# Patient Record
Sex: Female | Born: 1945
Health system: Southern US, Community
[De-identification: ages and names within clinical notes are randomized; demographics above are authoritative.]

## PROBLEM LIST (undated history)

## (undated) ENCOUNTER — Emergency Department (HOSPITAL_BASED_OUTPATIENT_CLINIC_OR_DEPARTMENT_OTHER): Admission: EM | Payer: Medicare HMO

## (undated) DIAGNOSIS — H269 Unspecified cataract: Secondary | ICD-10-CM

## (undated) DIAGNOSIS — D649 Anemia, unspecified: Secondary | ICD-10-CM

## (undated) DIAGNOSIS — K8020 Calculus of gallbladder without cholecystitis without obstruction: Secondary | ICD-10-CM

## (undated) DIAGNOSIS — R112 Nausea with vomiting, unspecified: Secondary | ICD-10-CM

## (undated) DIAGNOSIS — K219 Gastro-esophageal reflux disease without esophagitis: Secondary | ICD-10-CM

## (undated) DIAGNOSIS — I10 Essential (primary) hypertension: Secondary | ICD-10-CM

## (undated) DIAGNOSIS — T7840XA Allergy, unspecified, initial encounter: Secondary | ICD-10-CM

## (undated) DIAGNOSIS — M1990 Unspecified osteoarthritis, unspecified site: Secondary | ICD-10-CM

## (undated) DIAGNOSIS — A539 Syphilis, unspecified: Secondary | ICD-10-CM

## (undated) DIAGNOSIS — Z923 Personal history of irradiation: Secondary | ICD-10-CM

## (undated) DIAGNOSIS — Z9889 Other specified postprocedural states: Secondary | ICD-10-CM

## (undated) DIAGNOSIS — C50919 Malignant neoplasm of unspecified site of unspecified female breast: Secondary | ICD-10-CM

## (undated) HISTORY — DX: Unspecified cataract: H26.9

## (undated) HISTORY — DX: Allergy, unspecified, initial encounter: T78.40XA

## (undated) HISTORY — PX: TUBAL LIGATION: SHX77

## (undated) HISTORY — DX: Malignant neoplasm of unspecified site of unspecified female breast: C50.919

## (undated) HISTORY — DX: Unspecified osteoarthritis, unspecified site: M19.90

## (undated) HISTORY — PX: JOINT REPLACEMENT: SHX530

## (undated) HISTORY — DX: Gastro-esophageal reflux disease without esophagitis: K21.9

## (undated) HISTORY — PX: DILATION AND CURETTAGE OF UTERUS: SHX78

## (undated) HISTORY — DX: Calculus of gallbladder without cholecystitis without obstruction: K80.20

## (undated) HISTORY — DX: Syphilis, unspecified: A53.9

## (undated) HISTORY — DX: Essential (primary) hypertension: I10

## (undated) HISTORY — PX: NO PAST SURGERIES: SHX2092

---

## 2018-04-24 ENCOUNTER — Encounter: Payer: Self-pay | Admitting: Family Medicine

## 2018-04-24 ENCOUNTER — Ambulatory Visit (INDEPENDENT_AMBULATORY_CARE_PROVIDER_SITE_OTHER): Payer: Medicare HMO | Admitting: Family Medicine

## 2018-04-24 ENCOUNTER — Encounter

## 2018-04-24 VITALS — BP 120/78 | HR 70 | Temp 98.3°F | Ht 62.0 in | Wt 169.2 lb

## 2018-04-24 DIAGNOSIS — R1011 Right upper quadrant pain: Secondary | ICD-10-CM

## 2018-04-24 MED ORDER — OMEPRAZOLE 20 MG PO CPDR: 20.0000 mg | DELAYED_RELEASE_CAPSULE | Freq: Every day | ORAL | 1 refills | Status: DC

## 2018-04-24 NOTE — Progress Notes (Signed)
Chief Complaint  Patient presents with  . New Patient (Initial Visit)       New Patient Visit SUBJECTIVE: HPI: Kelly Dixon is an 72 y.o.female who is being seen for establishing care.   The patient was previously seen at office in [facility].  1.5 mo. Pt has had episodes of abd pain that is severe in the RUQ, but also affects her upper abd in general. Has cut out caffeine. Worse after she eats certain things that are fattier/greasier. No fevers, N/V, some diarrhea, injury. Denies hx of GERD.   No Known Allergies  Past Medical History:  Diagnosis Date  . Arthritis   . GERD (gastroesophageal reflux disease)   . Syphilis    Past Surgical History:  Procedure Laterality Date  . NO PAST SURGERIES       Family History  Problem Relation Age of Onset  . Arthritis Mother   . Hearing loss Mother   . Heart disease Father   . Arthritis Sister    No Known Allergies  Current Outpatient Medications:  .  omeprazole (PRILOSEC) 20 MG capsule, Take 1 capsule (20 mg total) by mouth daily., Disp: 30 capsule, Rfl: 1  ROS Const: Denies fevers  GI: As noted in HPI   OBJECTIVE: BP 120/78 (BP Location: Left Arm, Patient Position: Sitting, Cuff Size: Normal)   Pulse 70   Temp 98.3 F (36.8 C) (Oral)   Ht 5\' 2"  (1.575 m)   Wt 169 lb 4 oz (76.8 kg)   SpO2 96%   BMI 30.96 kg/m   Constitutional: -  VS reviewed -  Well developed, well nourished, appears stated age -  No apparent distress  Psychiatric: -  Oriented to person, place, and time -  Memory intact -  Affect and mood normal -  Fluent conversation, good eye contact -  Judgment and insight age appropriate  Eye: -  Conjunctivae clear, no discharge -  Pupils symmetric, round, reactive to light  ENMT: -  MMM    Pharynx moist, no exudate, no erythema  Neck: -  No gross swelling, no palpable masses -  Thyroid midline, not enlarged, mobile, no palpable masses  Cardiovascular: -  RRR -  No LE edema  Respiratory: -  Normal respiratory  effort, no accessory muscle use, no retraction -  Breath sounds equal, no wheezes, no ronchi, no crackles  Gastrointestinal: -  Bowel sounds normal -  Soft, TTP diffusely, worse in upper abd, neg Murphy's, McBurney's, Rovsing's  Neurological:  -  CN II - XII grossly intact -  Sensation grossly intact to light touch, equal bilaterally  Skin: -  No significant lesion on inspection -  Warm and dry to palpation   ASSESSMENT/PLAN: RUQ abdominal pain - Plan: US Abdomen Limited RUQ, omeprazole (PRILOSEC) 20 MG capsule  Patient instructed to sign release of records form from her previous PCP. Ck [place], also start tx for GERD. Patient should return in 1 mo for CPE. The patient voiced understanding and agreement to the plan.   [place], DO 04/24/18  10:19 AM

## 2018-04-24 NOTE — Progress Notes (Signed)
Pre visit review using our clinic review tool, if applicable. No additional management support is needed unless otherwise documented below in the visit note. 

## 2018-04-24 NOTE — Patient Instructions (Addendum)
The only lifestyle changes that have data behind them are weight loss for the overweight/obese and elevating the head of the bed. Finding out which foods/positions are triggers is important.  2-4 days to get your ultrasound. If you don't hear anything, let us know.  We will be in touch regarding the results.   Let us know if you need anything.

## 2018-04-25 ENCOUNTER — Ambulatory Visit (HOSPITAL_BASED_OUTPATIENT_CLINIC_OR_DEPARTMENT_OTHER)
Admission: RE | Admit: 2018-04-25 | Discharge: 2018-04-25 | Disposition: A | Discharge status: Home / Self Care | Payer: Medicare HMO | Source: Ambulatory Visit | Attending: Family Medicine | Admitting: Family Medicine

## 2018-04-25 DIAGNOSIS — K8020 Calculus of gallbladder without cholecystitis without obstruction: Secondary | ICD-10-CM | POA: Diagnosis not present

## 2018-04-25 DIAGNOSIS — R1011 Right upper quadrant pain: Secondary | ICD-10-CM | POA: Diagnosis not present

## 2018-04-27 ENCOUNTER — Telehealth: Payer: Self-pay | Admitting: Family Medicine

## 2018-04-27 NOTE — Telephone Encounter (Signed)
Patient returning call Copied from [place] [phone]. Topic: Quick Communication - Lab Results >> Apr 27, 2018 12:13 PM Ewing, Donell Sievert, CMA wrote: Called patient to inform them of 04/27/2018 lab results. When patient returns call, triage nurse may disclose results.

## 2018-04-28 ENCOUNTER — Telehealth: Payer: Self-pay | Admitting: Family Medicine

## 2018-04-28 NOTE — Telephone Encounter (Unsigned)
Copied from [place] [id number]. Topic: General - Other >> Apr 28, 2018 12:25 PM Mcneil, Ja-Kwan wrote: Reason for CRM: Pt returned call for lab results. Pt requests call back. Cb# [phone]

## 2018-04-28 NOTE — Telephone Encounter (Signed)
See result note.  

## 2018-04-28 NOTE — Telephone Encounter (Addendum)
Left message on voicemail for pt to return call to office for results. See result note   

## 2018-04-29 ENCOUNTER — Encounter: Payer: Self-pay | Admitting: Family Medicine

## 2018-05-01 ENCOUNTER — Other Ambulatory Visit: Payer: Self-pay | Admitting: Family Medicine

## 2018-05-01 DIAGNOSIS — K8020 Calculus of gallbladder without cholecystitis without obstruction: Secondary | ICD-10-CM

## 2018-05-27 ENCOUNTER — Encounter: Payer: Self-pay | Admitting: Family Medicine

## 2018-05-27 ENCOUNTER — Ambulatory Visit (INDEPENDENT_AMBULATORY_CARE_PROVIDER_SITE_OTHER): Payer: Medicare HMO | Admitting: Family Medicine

## 2018-05-27 VITALS — BP 118/80 | HR 82 | Temp 98.2°F | Ht 62.0 in | Wt 174.0 lb

## 2018-05-27 DIAGNOSIS — Z1239 Encounter for other screening for malignant neoplasm of breast: Secondary | ICD-10-CM

## 2018-05-27 DIAGNOSIS — Z0000 Encounter for general adult medical examination without abnormal findings: Secondary | ICD-10-CM | POA: Diagnosis not present

## 2018-05-27 DIAGNOSIS — M170 Bilateral primary osteoarthritis of knee: Secondary | ICD-10-CM | POA: Diagnosis not present

## 2018-05-27 DIAGNOSIS — E2839 Other primary ovarian failure: Secondary | ICD-10-CM

## 2018-05-27 DIAGNOSIS — Z23 Encounter for immunization: Secondary | ICD-10-CM

## 2018-05-27 DIAGNOSIS — Z1231 Encounter for screening mammogram for malignant neoplasm of breast: Secondary | ICD-10-CM | POA: Diagnosis not present

## 2018-05-27 DIAGNOSIS — E785 Hyperlipidemia, unspecified: Secondary | ICD-10-CM | POA: Diagnosis not present

## 2018-05-27 MED ORDER — MELOXICAM 7.5 MG PO TABS: 7.5000 mg | ORAL_TABLET | Freq: Every day | ORAL | 2 refills | Status: DC

## 2018-05-27 NOTE — Progress Notes (Signed)
Chief Complaint  Patient presents with  . Annual Exam     Well Woman Kelly Dixon is here for a complete physical.   Her last physical was >1 year ago.  Current diet: in general, a "healthy" diet. Current exercise: swimming. Weight is largely stable and she denies daytime fatigue. No LMP recorded. Seatbelt? Yes  Health Maintenance Colonoscopy- No; Cologard Shingrix- No DEXA- No Mammogram- No Tetanus- No Pneumonia- No Hep C screen- No  Past Medical History:  Diagnosis Date  . Arthritis   . GERD (gastroesophageal reflux disease)   . Syphilis      Past Surgical History:  Procedure Laterality Date  . NO PAST SURGERIES      Medications  No current outpatient medications on file prior to visit.   No current facility-administered medications on file prior to visit.      Allergies No Known Allergies  Review of Systems: Constitutional:  no sweats Eye:  no recent significant change in vision Ear/Nose/Mouth/Throat:  Ears:  No changes in hearing Nose/Mouth/Throat:  no complaints of nasal congestion, no sore throat Cardiovascular: no chest pain Respiratory:  No cough and no shortness of breath Gastrointestinal:  no abdominal pain, no change in bowel habits GU:  Female: negative for dysuria or pelvic pain Musculoskeletal/Extremities:  no pain of the joints Integumentary (Skin/Breast):  no abnormal skin lesions reported Neurologic:  no headaches Psychiatric:  no anxiety, no depression Endocrine:  denies unexplained weight changes Hematologic/Lymphatic:  no abnormal bleeding  Exam BP 118/80 (BP Location: Left Arm, Patient Position: Sitting, Cuff Size: Normal)   Pulse 82   Temp 98.2 F (36.8 C) (Oral)   Ht 5\' 2"  (1.575 m)   Wt 174 lb (78.9 kg)   SpO2 98%   BMI 31.83 kg/m  General:  well developed, well nourished, in no apparent distress Skin:  no significant moles, warts, or growths Head:  no masses, lesions, or tenderness Eyes:  pupils equal and round, sclera  anicteric without injection Ears:  canals without lesions, TMs shiny without retraction, no obvious effusion, no erythema Nose:  nares patent, septum midline, mucosa normal, and no drainage or sinus tenderness Throat/Pharynx:  lips and gingiva without lesion; tongue and uvula midline; non-inflamed pharynx; no exudates or postnasal drainage Neck: neck supple without adenopathy, thyromegaly, or masses Lungs:  clear to auscultation, breath sounds equal bilaterally, no respiratory distress Cardio:  regular rate and rhythm, no bruits or LE edema Abdomen:  abdomen soft, nontender; bowel sounds normal; no masses or organomegaly Genital: Deferred Musculoskeletal:  symmetrical muscle groups noted without atrophy or deformity Extremities:  no clubbing, cyanosis, or edema, no deformities, no skin discoloration Neuro:  gait normal; deep tendon reflexes normal and symmetric Psych: well oriented with normal range of affect and appropriate judgment/insight  Assessment and Plan  Well adult exam - Plan: Lipid panel, Comprehensive metabolic panel, Hepatitis C antibody  Estrogen deficiency - Plan: DG Bone Density  Primary osteoarthritis of both knees - Plan: meloxicam (MOBIC) 7.5 MG tablet  Screening for breast cancer - Plan: MM DIGITAL SCREENING BILATERAL  Need for tetanus booster - Plan: Tdap vaccine greater than or equal to 7yo IM  Need for vaccination against Streptococcus pneumoniae - Plan: Pneumococcal conjugate vaccine 13-valent IM   Well 72 y.o. female. Counseled on diet and exercise. Pt refuses colonoscopy, will do Cologard.  Will see about Shingrix.  Trial low dose of Mobic, consider injections in future. Other orders as above. Labs need to go through [facility] lab. Follow up in  1 year pending the above workup. 6 mo for AWV. The patient voiced understanding and agreement to the plan.  [place], DO 05/27/18 9:37 AM

## 2018-05-27 NOTE — Addendum Note (Signed)
Addended by: Harl Bowie on: 05/27/2018 12:05 PM   Modules accepted: Orders

## 2018-05-27 NOTE — Patient Instructions (Addendum)
Give [place] 2-3 business days to get the results of your labs back.   Stay active and keep the diet clean.  Claritin (loratadine), Allegra (fexofenadine), Zyrtec (cetirizine); these are listed in order from weakest to strongest. Generic, and therefore cheaper, options are in the parentheses.   Flonase (fluticasone); nasal spray that is over the counter. 2 sprays each nostril, once daily. Aim towards the same side eye when you spray.  There are available OTC, and the generic versions, which may be cheaper, are in parentheses. Show this to a pharmacist if you have trouble finding any of these items.  Let us know if you need anything.

## 2018-05-28 LAB — COMPREHENSIVE METABOLIC PANEL
AG Ratio: 1.7 (calc) (ref 1.0–2.5)
ALBUMIN MSPROF: 4.2 g/dL (ref 3.6–5.1)
ALT: 6 U/L (ref 6–29)
AST: 11 U/L (ref 10–35)
Alkaline phosphatase (APISO): 59 U/L (ref 33–130)
BUN: 14 mg/dL (ref 7–25)
CALCIUM: 9.2 mg/dL (ref 8.6–10.4)
CO2: 26 mmol/L (ref 20–32)
CREATININE: 0.79 mg/dL (ref 0.50–0.99)
Chloride: 108 mmol/L (ref 98–110)
GLUCOSE: 83 mg/dL (ref 65–99)
Globulin: 2.5 g/dL (calc) (ref 1.9–3.7)
POTASSIUM: 4.7 mmol/L (ref 3.5–5.3)
SODIUM: 145 mmol/L (ref 135–146)
TOTAL PROTEIN: 6.7 g/dL (ref 6.1–8.1)
Total Bilirubin: 0.4 mg/dL (ref 0.2–1.2)

## 2018-05-28 LAB — LIPID PANEL
CHOL/HDL RATIO: 3.6 (calc) (ref <5.0)
CHOLESTEROL: 186 mg/dL (ref <200)
HDL: 52 mg/dL (ref >50)
LDL CHOLESTEROL (CALC): 112 mg/dL — AB
NON-HDL CHOLESTEROL (CALC): 134 mg/dL — AB (ref <130)
Triglycerides: 114 mg/dL (ref <150)

## 2018-05-28 LAB — HEPATITIS C ANTIBODY
Hepatitis C Ab: NONREACTIVE
SIGNAL TO CUT-OFF: 0.02 (ref <1.00)

## 2018-05-31 DIAGNOSIS — K8020 Calculus of gallbladder without cholecystitis without obstruction: Secondary | ICD-10-CM

## 2018-05-31 HISTORY — DX: Calculus of gallbladder without cholecystitis without obstruction: K80.20

## 2018-06-03 ENCOUNTER — Telehealth: Payer: Self-pay

## 2018-06-03 NOTE — Telephone Encounter (Signed)
Cologuard ordered through [employer] portal.  

## 2018-06-10 DIAGNOSIS — Z1211 Encounter for screening for malignant neoplasm of colon: Secondary | ICD-10-CM | POA: Diagnosis not present

## 2018-06-12 ENCOUNTER — Other Ambulatory Visit: Payer: Self-pay | Admitting: Family Medicine

## 2018-06-12 DIAGNOSIS — M170 Bilateral primary osteoarthritis of knee: Secondary | ICD-10-CM

## 2018-06-12 MED ORDER — MELOXICAM 7.5 MG PO TABS: 7.5000 mg | ORAL_TABLET | Freq: Every day | ORAL | 0 refills | Status: DC

## 2018-06-24 ENCOUNTER — Encounter: Payer: Self-pay | Admitting: Family Medicine

## 2018-06-24 ENCOUNTER — Ambulatory Visit (INDEPENDENT_AMBULATORY_CARE_PROVIDER_SITE_OTHER): Payer: Medicare HMO | Admitting: Family Medicine

## 2018-06-24 VITALS — BP 126/72 | HR 69 | Temp 98.1°F | Resp 16 | Wt 175.0 lb

## 2018-06-24 DIAGNOSIS — Z111 Encounter for screening for respiratory tuberculosis: Secondary | ICD-10-CM

## 2018-06-24 DIAGNOSIS — H52209 Unspecified astigmatism, unspecified eye: Secondary | ICD-10-CM | POA: Diagnosis not present

## 2018-06-24 DIAGNOSIS — Z0289 Encounter for other administrative examinations: Secondary | ICD-10-CM | POA: Diagnosis not present

## 2018-06-24 DIAGNOSIS — H5213 Myopia, bilateral: Secondary | ICD-10-CM | POA: Diagnosis not present

## 2018-06-24 NOTE — Progress Notes (Signed)
CC: form filled out.  Subjective: Patient is a 72 y.o. female here for completion of form.  Needs Tb test. 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 W exception of flu shot, she is UTD with her immunizations. No recent travel to Tb endemic countries. Able to lift up to 40 lbs w/o issue. No sob or cp.   ROS: Heart: Denies chest pain  Lungs: Denies SOB   Past Medical History:  Diagnosis Date  . Arthritis   . GERD (gastroesophageal reflux disease)   . Syphilis     Objective: BP 126/72   Pulse 69   Temp 98.1 F (36.7 C) (Oral)   Resp 16   Wt 175 lb (79.4 kg)   SpO2 98%   BMI 32.01 kg/m  General: Awake, appears stated age HEENT: MMM, EOMi Heart: RRR, no murmurs Lungs: CTAB, no rales, wheezes or rhonchi. No accessory muscle use MSK: 5/5 strength throughout; gait nml Psych: Age appropriate judgment and insight, normal affect and mood  Assessment and Plan: Encounter for completion of form with patient  Screening for tuberculosis - Plan: PPD  Tb test today. Fill out form, will complete Fri when Tb test info available. F/u prn otherwise regarding this. The patient voiced understanding and agreement to the plan.  [place], DO 06/24/18  10:18 AM

## 2018-06-24 NOTE — Patient Instructions (Addendum)
Please bring the fax number for where to send your form.  We will make a copy of your form and fax it when we have it. You will also receive a copy.  Let us know if you need anything.

## 2018-06-26 ENCOUNTER — Ambulatory Visit: Payer: Medicare HMO

## 2018-06-26 DIAGNOSIS — Z111 Encounter for screening for respiratory tuberculosis: Secondary | ICD-10-CM

## 2018-06-26 LAB — TB SKIN TEST
INDURATION: 0 mm
TB Skin Test: NEGATIVE

## 2018-06-29 LAB — COLOGUARD: Cologuard: NEGATIVE

## 2018-06-29 NOTE — Telephone Encounter (Signed)
Received Cologuard results//Negative//sent results to scan//called the patient to inform//abstracted into chart.

## 2018-07-27 ENCOUNTER — Encounter (HOSPITAL_BASED_OUTPATIENT_CLINIC_OR_DEPARTMENT_OTHER): Payer: Self-pay | Admitting: Radiology

## 2018-07-27 ENCOUNTER — Ambulatory Visit (HOSPITAL_BASED_OUTPATIENT_CLINIC_OR_DEPARTMENT_OTHER)
Admission: RE | Admit: 2018-07-27 | Discharge: 2018-07-27 | Disposition: A | Discharge status: Home / Self Care | Payer: Medicare HMO | Source: Ambulatory Visit | Attending: Family Medicine | Admitting: Family Medicine

## 2018-07-27 DIAGNOSIS — Z13820 Encounter for screening for osteoporosis: Secondary | ICD-10-CM | POA: Diagnosis not present

## 2018-07-27 DIAGNOSIS — F17200 Nicotine dependence, unspecified, uncomplicated: Secondary | ICD-10-CM | POA: Diagnosis not present

## 2018-07-27 DIAGNOSIS — Z780 Asymptomatic menopausal state: Secondary | ICD-10-CM | POA: Insufficient documentation

## 2018-07-27 DIAGNOSIS — Z1239 Encounter for other screening for malignant neoplasm of breast: Secondary | ICD-10-CM

## 2018-07-27 DIAGNOSIS — M8589 Other specified disorders of bone density and structure, multiple sites: Secondary | ICD-10-CM | POA: Diagnosis not present

## 2018-07-27 DIAGNOSIS — M85832 Other specified disorders of bone density and structure, left forearm: Secondary | ICD-10-CM | POA: Diagnosis not present

## 2018-07-27 DIAGNOSIS — Z1231 Encounter for screening mammogram for malignant neoplasm of breast: Secondary | ICD-10-CM | POA: Insufficient documentation

## 2018-07-27 DIAGNOSIS — E2839 Other primary ovarian failure: Secondary | ICD-10-CM | POA: Insufficient documentation

## 2018-07-27 DIAGNOSIS — M85852 Other specified disorders of bone density and structure, left thigh: Secondary | ICD-10-CM | POA: Diagnosis not present

## 2018-09-30 DIAGNOSIS — C50919 Malignant neoplasm of unspecified site of unspecified female breast: Secondary | ICD-10-CM

## 2018-09-30 HISTORY — DX: Malignant neoplasm of unspecified site of unspecified female breast: C50.919

## 2018-11-27 ENCOUNTER — Ambulatory Visit: Payer: Medicare HMO | Admitting: *Deleted

## 2018-12-23 NOTE — Progress Notes (Deleted)
I connected with Jeliyah on 12/23/18 at  4:00 PM EDT by a video enabled telemedicine application and verified that I am speaking with the correct person using two identifiers.   Subjective:   Allyne Hebert is a 73 y.o. female who presents for an Initial Medicare Annual Wellness Visit.  Review of Systems   No ROS.  Medicare Wellness Visit. Additional risk factors are reflected in the social history.   Sleep patterns:  Home Safety/Smoke Alarms: Feels safe in home. Smoke alarms in place.   Female:        Mammo- 07/27/18       Dexa scan- 07/27/18 [facility]- Cologuard 06/29/18-neg     Objective:  UTA vitals. Virtual visit d/t covid 19.  No flowsheet data found.  Current Medications (verified) Outpatient Encounter Medications as of 12/24/2018  Medication Sig  . meloxicam (MOBIC) 7.5 MG tablet Take 1 tablet (7.5 mg total) by mouth daily.   No facility-administered encounter medications on file as of 12/24/2018.     Allergies (verified) Patient has no known allergies.   History: Past Medical History:  Diagnosis Date  . Arthritis   . GERD (gastroesophageal reflux disease)   . Syphilis    Past Surgical History:  Procedure Laterality Date  . NO PAST SURGERIES     Family History  Problem Relation Age of Onset  . Arthritis Mother   . Hearing loss Mother   . Heart disease Father   . Arthritis Sister    Social History   Socioeconomic History  . Marital status: Divorced    Spouse name: Not on file  . Number of children: Not on file  . Years of education: Not on file  . Highest education level: Not on file  Occupational History  . Not on file  Social Needs  . Financial resource strain: Not on file  . Food insecurity:    Worry: Not on file    Inability: Not on file  . Transportation needs:    Medical: Not on file    Non-medical: Not on file  Tobacco Use  . Smoking status: Current Every Day Smoker  . Smokeless tobacco: Never Used  Substance and Sexual Activity  .  Alcohol use: Yes    Comment: rarely  . Drug use: Never  . Sexual activity: Not on file  Lifestyle  . Physical activity:    Days per week: Not on file    Minutes per session: Not on file  . Stress: Not on file  Relationships  . Social connections:    Talks on phone: Not on file    Gets together: Not on file    Attends religious service: Not on file    Active member of club or organization: Not on file    Attends meetings of clubs or organizations: Not on file    Relationship status: Not on file  Other Topics Concern  . Not on file  Social History Narrative  . Not on file    Tobacco Counseling Ready to quit: Not Answered Counseling given: Not Answered   Clinical Intake:                        Activities of Daily Living No flowsheet data found.   Immunizations and Health Maintenance Immunization History  Administered Date(s) Administered  . PPD Test 06/24/2018  . Pneumococcal Conjugate-13 05/27/2018  . Tdap 05/27/2018   Health Maintenance Due  Topic Date Due  . COLONOSCOPY  07/27/1998  . INFLUENZA VACCINE  04/30/2018    Patient Care Team: Shelda Pal, DO as PCP - General (Family Medicine)  Indicate any recent Medical Services you may have received from other than [facility] providers in the past year (date may be approximate).     Assessment:   This is a routine wellness examination for Everlee. Physical assessment deferred to PCP.  Hearing/Vision screen [place] -virtual- covid 19 Dietary issues and exercise activities discussed:   Diet (meal preparation, eat out, water intake, caffeinated beverages, dairy products, fruits and vegetables): {Desc; diets:16563} Breakfast: Lunch:  Dinner:      Goals   None    Depression Screen No flowsheet data found.  Fall Risk No flowsheet data found.  Cognitive Function:        Screening Tests Health Maintenance  Topic Date Due  . COLONOSCOPY  07/27/1998  . INFLUENZA VACCINE  04/30/2018   . PNA vac Low Risk Adult (2 of 2 - PPSV23) 05/28/2019  . MAMMOGRAM  07/27/2020  . TETANUS/TDAP  05/27/2028  . DEXA SCAN  Completed  . Hepatitis C Screening  Completed     Plan:   ***  I have personally reviewed and noted the following in the patient's chart:   . Medical and social history . Use of alcohol, tobacco or illicit drugs  . Current medications and supplements . Functional ability and status . Nutritional status . Physical activity . Advanced directives . List of other physicians . Hospitalizations, surgeries, and ER visits in previous 12 months . Vitals . Screenings to include cognitive, depression, and falls . Referrals and appointments  In addition, I have reviewed and discussed with patient certain preventive protocols, quality metrics, and best practice recommendations. A written personalized care plan for preventive services as well as general preventive health recommendations were provided to patient.     Naaman Plummer [place], [place]   12/23/2018

## 2018-12-24 ENCOUNTER — Other Ambulatory Visit: Payer: Self-pay

## 2018-12-24 ENCOUNTER — Ambulatory Visit: Payer: Medicare HMO | Admitting: *Deleted

## 2018-12-25 NOTE — Progress Notes (Signed)
I connected with [name] on 12/28/18 at 11:00 AM EDT by a video enabled telemedicine application and verified that I am speaking with the correct person using two identifiers.   Subjective:   Kelly Dixon is a 73 y.o. female who presents for an Initial Medicare Annual Wellness Visit.  The Patient was informed that the wellness visit is to identify future health risk and educate and initiate measures that can reduce risk for increased disease through the lifespan.   Describes health as fair, good or great? Good  Kelly Dixon is reading. Goes to church.   Review of Systems   No ROS.  Medicare Wellness Visit. Additional risk factors are reflected in the social history. Cardiac Risk Factors include: advanced age (>54men, >69 women) Sleep patterns: sleeps 8 hrs every night. Feels rested. Home Safety/Smoke Alarms: Feels safe in home. Smoke alarms in place.  Lives with niece in 2 story home. Step over tub. Grab bar.   Female:     Mammo- 07/27/18      Dexa scan- 07/27/18 [facility]- Cologuard 06/29/18.  Negative     Objective:    There were no vitals filed for this visit. There is no height or weight on file to calculate BMI.  Advanced Directives 12/28/2018  Does Patient Have a Medical Advance Directive? No  Would patient like information on creating a medical advance directive? No - Patient declined    Current Medications (verified) Outpatient Encounter Medications as of 12/28/2018  Medication Sig  . loratadine (CLARITIN) 10 MG tablet Take 10 mg by mouth daily.  . meloxicam (MOBIC) 7.5 MG tablet Take 1 tablet (7.5 mg total) by mouth daily.   No facility-administered encounter medications on file as of 12/28/2018.     Allergies (verified) Patient has no known allergies.   History: Past Medical History:  Diagnosis Date  . Arthritis   . Gallstones 05/31/2018  . GERD (gastroesophageal reflux disease)   . Syphilis    Past Surgical History:  Procedure Laterality Date  . NO PAST  SURGERIES     Family History  Problem Relation Age of Onset  . Arthritis Mother   . Hearing loss Mother   . Heart disease Father   . Arthritis Sister    Social History   Socioeconomic History  . Marital status: Divorced    Spouse name: Not on file  . Number of children: Not on file  . Years of education: Not on file  . Highest education level: Not on file  Occupational History  . Not on file  Social Needs  . Financial resource strain: Not on file  . Food insecurity:    Worry: Not on file    Inability: Not on file  . Transportation needs:    Medical: Not on file    Non-medical: Not on file  Tobacco Use  . Smoking status: Current Every Day Smoker    Packs/day: 0.25  . Smokeless tobacco: Never Used  Substance and Sexual Activity  . Alcohol use: Yes    Comment: rarely  . Drug use: Never  . Sexual activity: Not on file  Lifestyle  . Physical activity:    Days per week: Not on file    Minutes per session: Not on file  . Stress: Not on file  Relationships  . Social connections:    Talks on phone: Not on file    Gets together: Not on file    Attends religious service: Not on file    Active member of  club or organization: Not on file    Attends meetings of clubs or organizations: Not on file    Relationship status: Not on file  Other Topics Concern  . Not on file  Social History Narrative  . Not on file    Tobacco Counseling Ready to quit: No Counseling given: No   Clinical Intake: Pain : No/denies pain    Activities of Daily Living In your present state of health, do you have any difficulty performing the following activities: 12/28/2018  Hearing? N  Vision? N  Difficulty concentrating or making decisions? N  Walking or climbing stairs? N  Dressing or bathing? N  Doing errands, shopping? N  Preparing Food and eating ? N  Using the Toilet? N  In the past six months, have you accidently leaked urine? N  Do you have problems with loss of bowel control?  N  Managing your Medications? N  Managing your Finances? N  Housekeeping or managing your Housekeeping? N     Immunizations and Health Maintenance Immunization History  Administered Date(s) Administered  . PPD Test 06/24/2018  . Pneumococcal Conjugate-13 05/27/2018  . Tdap 05/27/2018   Health Maintenance Due  Topic Date Due  . INFLUENZA VACCINE  04/30/2018    Patient Care Team: Shelda Pal, DO as PCP - General (Family Medicine)  Indicate any recent Medical Services you may have received from other than [facility] providers in the past year (date may be approximate).     Assessment:   This is a routine wellness examination for Panda. Physical assessment deferred to PCP.  Hearing/Vision screen [place]. Virtual visit. covid 19  Dietary issues and exercise activities discussed: Current Exercise Habits: The patient does not participate in regular exercise at present, Exercise limited by: orthopedic condition(s)(knee pain) Diet (meal preparation, eat out, water intake, caffeinated beverages, dairy products, fruits and vegetables): in general, a "healthy" diet  , well balanced, on average, 3 meals per day. Drinks plenty of water throughout the day.        Goals    . Maintain current health.      Depression Screen PHQ 2/9 Scores 12/28/2018  PHQ - 2 Score 0    Fall Risk Fall Risk  12/28/2018  Falls in the past year? 0    Cognitive Function: Ad8 score reviewed for issues:  Issues making decisions:no  Less interest in hobbies / activities:no  Repeats questions, stories (family complaining):no  Trouble using ordinary gadgets (microwave, computer, phone):no  Forgets the month or year: no  Mismanaging finances: no  Remembering appts:no  Daily problems with thinking and/or memory:no Ad8 score is=0         Screening Tests Health Maintenance  Topic Date Due  . INFLUENZA VACCINE  04/30/2018  . PNA vac Low Risk Adult (2 of 2 - PPSV23) 05/28/2019  . MAMMOGRAM   07/27/2020  . TETANUS/TDAP  05/27/2028  . COLONOSCOPY  06/29/2028  . DEXA SCAN  Completed  . Hepatitis C Screening  Completed      Plan:     See you next year!  Continue to eat heart healthy diet (full of fruits, vegetables, whole grains, lean protein, water--limit salt, fat, and sugar intake) and increase physical activity as tolerated.  Continue doing brain stimulating activities (puzzles, reading, adult coloring books, staying active) to keep memory sharp.     I have personally reviewed and noted the following in the patient's chart:   . Medical and social history . Use of alcohol, tobacco or illicit  drugs  . Current medications and supplements . Functional ability and status . Nutritional status . Physical activity . Advanced directives . List of other physicians . Hospitalizations, surgeries, and ER visits in previous 12 months . Vitals . Screenings to include cognitive, depression, and falls . Referrals and appointments  In addition, I have reviewed and discussed with patient certain preventive protocols, quality metrics, and best practice recommendations. A written personalized care plan for preventive services as well as general preventive health recommendations were provided to patient.     Shela Nevin, [place]   12/28/2018

## 2018-12-28 ENCOUNTER — Other Ambulatory Visit: Payer: Self-pay

## 2018-12-28 ENCOUNTER — Encounter: Payer: Self-pay | Admitting: *Deleted

## 2018-12-28 ENCOUNTER — Ambulatory Visit (INDEPENDENT_AMBULATORY_CARE_PROVIDER_SITE_OTHER): Payer: Medicare HMO | Admitting: *Deleted

## 2018-12-28 DIAGNOSIS — Z0000 Encounter for general adult medical examination without abnormal findings: Secondary | ICD-10-CM

## 2018-12-28 NOTE — Patient Instructions (Signed)
See you next year!  Continue to eat heart healthy diet (full of fruits, vegetables, whole grains, lean protein, water--limit salt, fat, and sugar intake) and increase physical activity as tolerated.  Continue doing brain stimulating activities (puzzles, reading, adult coloring books, staying active) to keep memory sharp.     Kelly Dixon , Thank you for taking time to come for your Medicare Wellness Visit. I appreciate your ongoing commitment to your health goals. Please review the following plan we discussed and let me know if I can assist you in the future.   These are the goals we discussed: Goals    . Maintain current health.       This is a list of the screening recommended for you and due dates:  Health Maintenance  Topic Date Due  . Flu Shot  04/30/2018  . Pneumonia vaccines (2 of 2 - PPSV23) 05/28/2019  . Mammogram  07/27/2020  . Tetanus Vaccine  05/27/2028  . Colon Cancer Screening  06/29/2028  . DEXA scan (bone density measurement)  Completed  .  Hepatitis C: One time screening is recommended by Center for Disease Control  (CDC) for  adults born from [birth year] through 1963.   Completed    Health Maintenance After Age 73 After age 79, you are at a higher risk for certain long-term diseases and infections as well as injuries from falls. Falls are a major cause of broken bones and head injuries in people who are older than age 51. Getting regular preventive care can help to keep you healthy and well. Preventive care includes getting regular testing and making lifestyle changes as recommended by your health care provider. Talk with your health care provider about:  Which screenings and tests you should have. A screening is a test that checks for a disease when you have no symptoms.  A diet and exercise plan that is right for you. What should I know about screenings and tests to prevent falls? Screening and testing are the best ways to find a health problem early. Early diagnosis and  treatment give you the best chance of managing medical conditions that are common after age 29. Certain conditions and lifestyle choices may make you more likely to have a fall. Your health care provider may recommend:  Regular vision checks. Poor vision and conditions such as cataracts can make you more likely to have a fall. If you wear glasses, make sure to get your prescription updated if your vision changes.  Medicine review. Work with your health care provider to regularly review all of the medicines you are taking, including over-the-counter medicines. Ask your health care provider about any side effects that may make you more likely to have a fall. Tell your health care provider if any medicines that you take make you feel dizzy or sleepy.  Osteoporosis screening. Osteoporosis is a condition that causes the bones to get weaker. This can make the bones weak and cause them to break more easily.  Blood pressure screening. Blood pressure changes and medicines to control blood pressure can make you feel dizzy.  Strength and balance checks. Your health care provider may recommend certain tests to check your strength and balance while standing, walking, or changing positions.  Foot health exam. Foot pain and numbness, as well as not wearing proper footwear, can make you more likely to have a fall.  Depression screening. You may be more likely to have a fall if you have a fear of falling, feel emotionally low, or  feel unable to do activities that you used to do.  Alcohol use screening. Using too much alcohol can affect your balance and may make you more likely to have a fall. What actions can I take to lower my risk of falls? General instructions  Talk with your health care provider about your risks for falling. Tell your health care provider if: ? You fall. Be sure to tell your health care provider about all falls, even ones that seem minor. ? You feel dizzy, sleepy, or off-balance.  Take  over-the-counter and prescription medicines only as told by your health care provider. These include any supplements.  Eat a healthy diet and maintain a healthy weight. A healthy diet includes low-fat dairy products, low-fat (lean) meats, and fiber from whole grains, beans, and lots of fruits and vegetables. Home safety  Remove any tripping hazards, such as rugs, cords, and clutter.  Install safety equipment such as grab bars in bathrooms and safety rails on stairs.  Keep rooms and walkways well-lit. Activity   Follow a regular exercise program to stay fit. This will help you maintain your balance. Ask your health care provider what types of exercise are appropriate for you.  If you need a cane or walker, use it as recommended by your health care provider.  Wear supportive shoes that have nonskid soles. Lifestyle  Do not drink alcohol if your health care provider tells you not to drink.  If you drink alcohol, limit how much you have: ? 0-1 drink a day for women. ? 0-2 drinks a day for men.  Be aware of how much alcohol is in your drink. In the [place] one drink equals one typical bottle of beer (12 oz), one-half glass of wine (5 oz), or one shot of hard liquor (1 oz).  Do not use any products that contain nicotine or tobacco, such as cigarettes and e-cigarettes. If you need help quitting, ask your health care provider. Summary  Having a healthy lifestyle and getting preventive care can help to protect your health and wellness after age 79.  Screening and testing are the best way to find a health problem early and help you avoid having a fall. Early diagnosis and treatment give you the best chance for managing medical conditions that are more common for people who are older than age 16.  Falls are a major cause of broken bones and head injuries in people who are older than age 58. Take precautions to prevent a fall at home.  Work with your health care provider to learn what changes  you can make to improve your health and wellness and to prevent falls. This information is not intended to replace advice given to you by your health care provider. Make sure you discuss any questions you have with your health care provider. Document Released: 07/30/2017 Document Revised: 07/30/2017 Document Reviewed: 07/30/2017 Elsevier Interactive Patient Education  2019 [employer].

## 2018-12-28 NOTE — Progress Notes (Signed)
Noted. Agree with above.  [place], DO 12/28/18 12:19 PM

## 2019-02-23 ENCOUNTER — Other Ambulatory Visit: Payer: Self-pay | Admitting: Family Medicine

## 2019-02-23 ENCOUNTER — Telehealth: Payer: Self-pay

## 2019-02-23 DIAGNOSIS — K8020 Calculus of gallbladder without cholecystitis without obstruction: Secondary | ICD-10-CM

## 2019-02-23 NOTE — Progress Notes (Signed)
am

## 2019-02-23 NOTE — Telephone Encounter (Signed)
OK to refer to Gen Surg for symptomatic gallstones. If she is unable to keep anything down, will need to seek care prior. Ty.

## 2019-02-23 NOTE — Telephone Encounter (Signed)
Copied from [place] [id number]. Topic: General - Other >> Feb 23, 2019 10:53 AM Antonieta Iba C wrote: Reason for CRM: pt called in to be advised. Pt says that she was told by her PCP that she has some gallbladder issues. Pt says that she was advised that she could live with this unless pt has concerns. Pt says that she has been having some stomach pain and vomiting. Pt says that she spoke with insurance [facility]) and was advised that she would like to have a gallbladder removal and to have office call them to have this authorized.    [employer] [phone]   pt says that she was told that she need a ambulatory Surgical Service and to call and speak with [facility] department for authorization.    Pt would like further assistance with this.

## 2019-02-23 NOTE — Telephone Encounter (Signed)
She is able to keep food down just does not feel like eating. She will call if things get worse and hopefully gets appt soon.

## 2019-02-23 NOTE — Telephone Encounter (Signed)
Referral done

## 2019-02-25 ENCOUNTER — Encounter: Payer: Self-pay | Admitting: Family Medicine

## 2019-03-17 ENCOUNTER — Ambulatory Visit: Payer: Self-pay | Admitting: Surgery

## 2019-03-17 DIAGNOSIS — K8010 Calculus of gallbladder with chronic cholecystitis without obstruction: Secondary | ICD-10-CM | POA: Diagnosis not present

## 2019-03-17 NOTE — H&P (Signed)
History of Present Illness Kelly Dixon. Trentyn Boisclair MD; 03/17/2019 3:30 PM) The patient is a 73 year old female who presents for evaluation of gall stones. Referred by Dr. Riki Sheer for symptomatic gallstones This is a very healthy 73 year old female who presents with a 4 year history of intermittent postprandial abdominal pain in the epigastrium and right upper quadrant. This is associated with significant abdominal bloating and flatulence. The patient has intermittent attacks of severe pain associated with nausea, vomiting, diarrhea. The pain radiates through her right side to her back. In July 2019, she underwent right upper quadrant ultrasound that showed numerous gallstones but no sign of acute cholecystitis. Liver function test obtained in August 2019 were unremarkable. Her symptoms seem to be a bit worse recently so she presents now for surgical evaluation. Her only previous abdominal surgery was a laparoscopic tubal ligation.    CLINICAL DATA: Sporadic postprandial right upper quadrant abdominal pain, nausea, vomiting and diarrhea for 2 months.  EXAM: ULTRASOUND ABDOMEN LIMITED RIGHT UPPER QUADRANT  COMPARISON: None.  FINDINGS: Gallbladder:  Numerous layering shadowing gallstones throughout the nondistended gallbladder measuring up to 1.8 cm in size, with no gallbladder wall thickening, pericholecystic fluid or sonographic Murphy sign.  Common bile duct:  Diameter: 2 mm  Liver:  No focal lesion identified. Within normal limits in parenchymal echogenicity. Portal vein is patent on color Doppler imaging with normal direction of blood flow towards the liver.  IMPRESSION: 1. Cholelithiasis. No evidence of acute cholecystitis. 2. No biliary ductal dilatation. 3. Normal liver.   Electronically Signed By: Ilona Sorrel M.D. On: 04/25/2018 14:45   Past Surgical History Kelly Dixon, [place]; 03/17/2019 2:23 PM) No pertinent past surgical history Oral  Surgery  Diagnostic Studies History Kelly Dixon, [place]; 03/17/2019 2:23 PM) Colonoscopy never Mammogram within last year Pap Smear >5 years ago  Allergies Kelly Dixon, CMA; 03/17/2019 2:24 PM) No Known Drug Allergies [03/17/2019]: Allergies Reconciled  Medication History Kelly Dixon, CMA; 03/17/2019 2:25 PM) Loratadine (10MG  Tablet, Oral) Active. Medications Reconciled  Social History Kelly Dixon, [place]; 03/17/2019 2:23 PM) Alcohol use Occasional alcohol use. Caffeine use Carbonated beverages. No caffeine use No drug use Tobacco use Current some day smoker.  Family History Kelly Dixon, [place]; 03/17/2019 2:23 PM) Heart Disease Father.  Pregnancy / Birth History Kelly Dixon, [place]; 03/17/2019 2:23 PM) Age at menarche 41 years. Age of menopause 31-60 Contraceptive History Oral contraceptives. Gravida 2 Irregular periods Maternal age 20-25 Para 2  Other Problems Kelly Dixon, [place]; 03/17/2019 2:23 PM) Arthritis Cholelithiasis Gastroesophageal Reflux Disease     Review of Systems Kelly Dixon CMA; 03/17/2019 2:23 PM) General Not Present- Appetite Loss, Chills, Fatigue, Fever, Night Sweats, Weight Gain and Weight Loss. Skin Not Present- Change in Wart/Mole, Dryness, Hives, Jaundice, New Lesions, Non-Healing Wounds, Rash and Ulcer. HEENT Not Present- Earache, Hearing Loss, Hoarseness, Nose Bleed, Oral Ulcers, Ringing in the Ears, Seasonal Allergies, Sinus Pain, Sore Throat, Visual Disturbances, Wears glasses/contact lenses and Yellow Eyes. Respiratory Not Present- Bloody sputum, Chronic Cough, Difficulty Breathing, Snoring and Wheezing. Breast Not Present- Breast Mass, Breast Pain, Nipple Discharge and Skin Changes. Cardiovascular Not Present- Chest Pain, Difficulty Breathing Lying Down, Leg Cramps, Palpitations, Rapid Heart Rate, Shortness of Breath and Swelling of Extremities. Gastrointestinal Present- Abdominal Pain, Bloating  and Indigestion. Not Present- Bloody Stool, Change in Bowel Habits, Chronic diarrhea, Constipation, Difficulty Swallowing, Excessive gas, Gets full quickly at meals, Hemorrhoids, Nausea, Rectal Pain and Vomiting. Female Genitourinary Not Present- Frequency, Nocturia, Painful Urination, Pelvic Pain and Urgency. Musculoskeletal Present-  Joint Pain. Not Present- Back Pain, Joint Stiffness, Muscle Pain, Muscle Weakness and Swelling of Extremities. Neurological Not Present- Decreased Memory, Fainting, Headaches, Numbness, Seizures, Tingling, Tremor, Trouble walking and Weakness. Psychiatric Not Present- Anxiety, Bipolar, Change in Sleep Pattern, Depression, Fearful and Frequent crying. Endocrine Not Present- Cold Intolerance, Excessive Hunger, Hair Changes, Heat Intolerance, Hot flashes and New Diabetes. Hematology Not Present- Blood Thinners, Easy Bruising, Excessive bleeding, Gland problems, HIV and Persistent Infections.  Vitals Kelly Dixon CMA; 03/17/2019 2:24 PM) 03/17/2019 2:24 PM Weight: 183.8 lb Height: 64in Body Surface Area: 1.89 m Body Mass Index: 31.55 kg/m  Temp.: 98.64F  Pulse: 94 (Regular)  BP: 126/78 (Sitting, Left Arm, Standard)        Physical Exam Rodman Key K. Lamekia Nolden MD; 03/17/2019 3:33 PM)  The physical exam findings are as follows: Note:WDWN in NAD Eyes: Pupils equal, round; sclera anicteric HENT: Oral mucosa moist; good dentition Neck: No masses palpated, no thyromegaly Lungs: CTA bilaterally; normal respiratory effort CV: Regular rate and rhythm; no murmurs; extremities well-perfused with no edema Abd: +bowel sounds, soft, non-tender, no palpable organomegaly; small umbilical hernia - reducible; healed umbilical incison Skin: Warm, dry; no sign of jaundice Psychiatric - alert and oriented x 4; calm mood and affect    Assessment & Plan Rodman Key K. Rand Boller MD; 03/17/2019 2:51 PM)  CHRONIC CHOLECYSTITIS WITH CALCULUS (K80.10)  Current Plans Schedule  for Surgery - Laparoscopic cholecystectomy with intraoperative cholangiogram. The surgical procedure has been discussed with the patient. Potential risks, benefits, alternative treatments, and expected outcomes have been explained. All of the patient's questions at this time have been answered. The likelihood of reaching the patient's treatment goal is good. The patient understand the proposed surgical procedure and wishes to proceed. Pt Education - Pamphlet Given - Laparoscopic Gallbladder Surgery: discussed with patient and provided information.  Kelly Dixon. Georgette Dover, MD, [place] [facility] [phone]  03/17/2019 3:33 PM

## 2019-03-17 NOTE — H&P (View-Only) (Signed)
History of Present Illness Imogene Burn. Juna Caban MD; 03/17/2019 3:30 PM) The patient is a 73 year old female who presents for evaluation of gall stones. Referred by Dr. Riki Sheer for symptomatic gallstones This is a very healthy 73 year old female who presents with a 4 year history of intermittent postprandial abdominal pain in the epigastrium and right upper quadrant. This is associated with significant abdominal bloating and flatulence. The patient has intermittent attacks of severe pain associated with nausea, vomiting, diarrhea. The pain radiates through her right side to her back. In July 2019, she underwent right upper quadrant ultrasound that showed numerous gallstones but no sign of acute cholecystitis. Liver function test obtained in August 2019 were unremarkable. Her symptoms seem to be a bit worse recently so she presents now for surgical evaluation. Her only previous abdominal surgery was a laparoscopic tubal ligation.    CLINICAL DATA: Sporadic postprandial right upper quadrant abdominal pain, nausea, vomiting and diarrhea for 2 months.  EXAM: ULTRASOUND ABDOMEN LIMITED RIGHT UPPER QUADRANT  COMPARISON: None.  FINDINGS: Gallbladder:  Numerous layering shadowing gallstones throughout the nondistended gallbladder measuring up to 1.8 cm in size, with no gallbladder wall thickening, pericholecystic fluid or sonographic Murphy sign.  Common bile duct:  Diameter: 2 mm  Liver:  No focal lesion identified. Within normal limits in parenchymal echogenicity. Portal vein is patent on color Doppler imaging with normal direction of blood flow towards the liver.  IMPRESSION: 1. Cholelithiasis. No evidence of acute cholecystitis. 2. No biliary ductal dilatation. 3. Normal liver.   Electronically Signed By: Ilona Sorrel M.D. On: 04/25/2018 14:45   Past Surgical History [name], [place]; 03/17/2019 2:23 PM) No pertinent past surgical history Oral  Surgery  Diagnostic Studies History [name], [place]; 03/17/2019 2:23 PM) Colonoscopy never Mammogram within last year Pap Smear >5 years ago  Allergies [name], CMA; 03/17/2019 2:24 PM) No Known Drug Allergies [03/17/2019]: Allergies Reconciled  Medication History [name], CMA; 03/17/2019 2:25 PM) Loratadine (10MG  Tablet, Oral) Active. Medications Reconciled  Social History [name], [place]; 03/17/2019 2:23 PM) Alcohol use Occasional alcohol use. Caffeine use Carbonated beverages. No caffeine use No drug use Tobacco use Current some day smoker.  Family History [name], [place]; 03/17/2019 2:23 PM) Heart Disease Father.  Pregnancy / Birth History [name], [place]; 03/17/2019 2:23 PM) Age at menarche 45 years. Age of menopause 53-60 Contraceptive History Oral contraceptives. Gravida 2 Irregular periods Maternal age 97-25 Para 2  Other Problems [name], [place]; 03/17/2019 2:23 PM) Arthritis Cholelithiasis Gastroesophageal Reflux Disease     Review of Systems [name] CMA; 03/17/2019 2:23 PM) General Not Present- Appetite Loss, Chills, Fatigue, Fever, Night Sweats, Weight Gain and Weight Loss. Skin Not Present- Change in Wart/Mole, Dryness, Hives, Jaundice, New Lesions, Non-Healing Wounds, Rash and Ulcer. HEENT Not Present- Earache, Hearing Loss, Hoarseness, Nose Bleed, Oral Ulcers, Ringing in the Ears, Seasonal Allergies, Sinus Pain, Sore Throat, Visual Disturbances, Wears glasses/contact lenses and Yellow Eyes. Respiratory Not Present- Bloody sputum, Chronic Cough, Difficulty Breathing, Snoring and Wheezing. Breast Not Present- Breast Mass, Breast Pain, Nipple Discharge and Skin Changes. Cardiovascular Not Present- Chest Pain, Difficulty Breathing Lying Down, Leg Cramps, Palpitations, Rapid Heart Rate, Shortness of Breath and Swelling of Extremities. Gastrointestinal Present- Abdominal Pain, Bloating  and Indigestion. Not Present- Bloody Stool, Change in Bowel Habits, Chronic diarrhea, Constipation, Difficulty Swallowing, Excessive gas, Gets full quickly at meals, Hemorrhoids, Nausea, Rectal Pain and Vomiting. Female Genitourinary Not Present- Frequency, Nocturia, Painful Urination, Pelvic Pain and Urgency. Musculoskeletal Present-  Joint Pain. Not Present- Back Pain, Joint Stiffness, Muscle Pain, Muscle Weakness and Swelling of Extremities. Neurological Not Present- Decreased Memory, Fainting, Headaches, Numbness, Seizures, Tingling, Tremor, Trouble walking and Weakness. Psychiatric Not Present- Anxiety, Bipolar, Change in Sleep Pattern, Depression, Fearful and Frequent crying. Endocrine Not Present- Cold Intolerance, Excessive Hunger, Hair Changes, Heat Intolerance, Hot flashes and New Diabetes. Hematology Not Present- Blood Thinners, Easy Bruising, Excessive bleeding, Gland problems, HIV and Persistent Infections.  Vitals [name] CMA; 03/17/2019 2:24 PM) 03/17/2019 2:24 PM Weight: 183.8 lb Height: 64in Body Surface Area: 1.89 m Body Mass Index: 31.55 kg/m  Temp.: 98.10F  Pulse: 94 (Regular)  BP: 126/78 (Sitting, Left Arm, Standard)        Physical Exam Rodman Key K. Solimar Maiden MD; 03/17/2019 3:33 PM)  The physical exam findings are as follows: Note:WDWN in NAD Eyes: Pupils equal, round; sclera anicteric HENT: Oral mucosa moist; good dentition Neck: No masses palpated, no thyromegaly Lungs: CTA bilaterally; normal respiratory effort CV: Regular rate and rhythm; no murmurs; extremities well-perfused with no edema Abd: +bowel sounds, soft, non-tender, no palpable organomegaly; small umbilical hernia - reducible; healed umbilical incison Skin: Warm, dry; no sign of jaundice Psychiatric - alert and oriented x 4; calm mood and affect    Assessment & Plan Rodman Key K. Kailie Polus MD; 03/17/2019 2:51 PM)  CHRONIC CHOLECYSTITIS WITH CALCULUS (K80.10)  Current Plans Schedule  for Surgery - Laparoscopic cholecystectomy with intraoperative cholangiogram. The surgical procedure has been discussed with the patient. Potential risks, benefits, alternative treatments, and expected outcomes have been explained. All of the patient's questions at this time have been answered. The likelihood of reaching the patient's treatment goal is good. The patient understand the proposed surgical procedure and wishes to proceed. Pt Education - Pamphlet Given - Laparoscopic Gallbladder Surgery: discussed with patient and provided information.  Imogene Burn. Georgette Dover, MD, [place] [facility] [phone]  03/17/2019 3:33 PM

## 2019-04-05 NOTE — Pre-Procedure Instructions (Signed)
Kelly Dixon  04/05/2019     [id number] - [place], [place] - [address] AT [place] [address] [place] [address] Phone: [phone] Fax: [phone]   Your procedure is scheduled on Tuesday, July 14th  Report to [facility] Entrance A at 8:30 A.M.  Call this number if you have problems the morning of surgery:  [phone]   Remember:  Do not eat or drink after midnight.    Take these medicines the morning of surgery with A SIP OF WATER  loratadine (CLARITIN)   7 days prior to surgery STOP taking any Aspirin (unless otherwise instructed by your surgeon), Aleve, Naproxen, Ibuprofen, Motrin, Advil, Goody's, BC's, all herbal medications, fish oil, and all vitamins.  Follow your surgeon's instructions on when to stop Aspirin.  If no instructions were given by your surgeon then you will need to call the office to get those instructions.     Special instructions:   - Preparing For Surgery  Before surgery, you can play an important role. Because skin is not sterile, your skin needs to be as free of germs as possible. You can reduce the number of germs on your skin by washing with CHG (chlorahexidine gluconate) Soap before surgery.  CHG is an antiseptic cleaner which kills germs and bonds with the skin to continue killing germs even after washing.    Oral Hygiene is also important to reduce your risk of infection.  Remember - BRUSH YOUR TEETH THE MORNING OF SURGERY WITH YOUR REGULAR TOOTHPASTE  Please do not use if you have an allergy to CHG or antibacterial soaps. If your skin becomes reddened/irritated stop using the CHG.  Do not shave (including legs and underarms) for at least 48 hours prior to first CHG shower. It is OK to shave your face.  Please follow these instructions carefully.   1. Shower the NIGHT BEFORE SURGERY and the MORNING OF SURGERY with CHG.   2. If you chose to wash your hair, wash your  hair first as usual with your normal shampoo.  3. After you shampoo, rinse your hair and body thoroughly to remove the shampoo.  4. Use CHG as you would any other liquid soap. You can apply CHG directly to the skin and wash gently with a scrungie or a clean washcloth.   5. Apply the CHG Soap to your body ONLY FROM THE NECK DOWN.  Do not use on open wounds or open sores. Avoid contact with your eyes, ears, mouth and genitals (private parts). Wash Face and genitals (private parts)  with your normal soap.  6. Wash thoroughly, paying special attention to the area where your surgery will be performed.  7. Thoroughly rinse your body with warm water from the neck down.  8. DO NOT shower/wash with your normal soap after using and rinsing off the CHG Soap.  9. Pat yourself dry with a CLEAN TOWEL.  10. Wear CLEAN PAJAMAS to bed the night before surgery, wear comfortable clothes the morning of surgery  11. Place CLEAN SHEETS on your bed the night of your first shower and DO NOT SLEEP WITH PETS.  Day of Surgery: Do not wear jewelry, make-up or nail polish.  Do not wear lotions, powders, or perfumes, or deodorant.  Do not shave 48 hours prior to surgery.    Do not bring valuables to the hospital.  [facility] is not responsible for any belongings or valuables.  Please wear clean clothes to the hospital/surgery center.   Remember to brush your teeth WITH YOUR REGULAR TOOTHPASTE.  Contacts, dentures or bridgework may not be worn into surgery.  Leave your suitcase in the car.  After surgery it may be brought to your room.  For patients admitted to the hospital, discharge time will be determined by your treatment team.  Patients discharged the day of surgery will not be allowed to drive home.   Please read over the following fact sheets that you were given. Pain Booklet and Coughing and Deep Breathing

## 2019-04-06 ENCOUNTER — Encounter (HOSPITAL_COMMUNITY): Payer: Self-pay

## 2019-04-06 ENCOUNTER — Other Ambulatory Visit: Payer: Self-pay

## 2019-04-06 ENCOUNTER — Encounter (HOSPITAL_COMMUNITY)
Admission: RE | Admit: 2019-04-06 | Discharge: 2019-04-06 | Disposition: A | Discharge status: Home / Self Care | Payer: Medicare HMO | Source: Ambulatory Visit | Attending: Surgery | Admitting: Surgery

## 2019-04-06 DIAGNOSIS — Z1159 Encounter for screening for other viral diseases: Secondary | ICD-10-CM | POA: Diagnosis not present

## 2019-04-06 DIAGNOSIS — Z01812 Encounter for preprocedural laboratory examination: Secondary | ICD-10-CM | POA: Diagnosis not present

## 2019-04-06 HISTORY — DX: Nausea with vomiting, unspecified: R11.2

## 2019-04-06 HISTORY — DX: Other specified postprocedural states: Z98.890

## 2019-04-06 LAB — BASIC METABOLIC PANEL
Anion gap: 8 (ref 5–15)
BUN: 7 mg/dL — ABNORMAL LOW (ref 8–23)
CO2: 24 mmol/L (ref 22–32)
Calcium: 8.7 mg/dL — ABNORMAL LOW (ref 8.9–10.3)
Chloride: 110 mmol/L (ref 98–111)
Creatinine, Ser: 0.75 mg/dL (ref 0.44–1.00)
GFR calc Af Amer: >60 mL/min (ref >60)
GFR calc non Af Amer: >60 mL/min (ref >60)
Glucose, Bld: 111 mg/dL — ABNORMAL HIGH (ref 70–99)
Potassium: 4 mmol/L (ref 3.5–5.1)
Sodium: 142 mmol/L (ref 135–145)

## 2019-04-06 LAB — CBC
HCT: 39.4 % (ref 36.0–46.0)
Hemoglobin: 12.7 g/dL (ref 12.0–15.0)
MCH: 30.6 pg (ref 26.0–34.0)
MCHC: 32.2 g/dL (ref 30.0–36.0)
MCV: 94.9 fL (ref 80.0–100.0)
Platelets: 211 10*3/uL (ref 150–400)
RBC: 4.15 MIL/uL (ref 3.87–5.11)
RDW: 12.1 % (ref 11.5–15.5)
WBC: 4.8 10*3/uL (ref 4.0–10.5)
nRBC: 0 % (ref 0.0–0.2)

## 2019-04-06 NOTE — Progress Notes (Signed)
PCP - Dr. Crosby Oyster [facility] Cardiologist - denies  Chest x-ray - denies EKG - denies Stress Test - denies  ECHO - denies Cardiac Cath - denies  Sleep Study - denies CPAP - N/A  Blood Thinner Instructions: N/A Aspirin Instructions: N/A  Anesthesia review: No  Coronavirus Screening  Have you experienced the following symptoms:  Cough yes/no: No Fever (>100.34F)  yes/no: No Runny nose yes/no: No Sore throat yes/no: No Difficulty breathing/shortness of breath  yes/no: No  Have you or a family member traveled in the last 14 days and where? yes/no: No   If the patient indicates "YES" to the above questions, their PAT will be rescheduled to limit the exposure to others and, the surgeon will be notified. THE PATIENT WILL NEED TO BE ASYMPTOMATIC FOR 14 DAYS.   If the patient is not experiencing any of these symptoms, the PAT nurse will instruct them to NOT bring anyone with them to their appointment since they may have these symptoms or traveled as well.   Please remind your patients and families that hospital visitation restrictions are in effect and the importance of the restrictions.   Patient denies shortness of breath, fever, cough and chest pain at PAT appointment  Patient verbalized understanding of instructions that were given to them at the PAT appointment. Patient was also instructed that they will need to review over the PAT instructions again at home before surgery.

## 2019-04-09 ENCOUNTER — Other Ambulatory Visit (HOSPITAL_COMMUNITY)
Admission: RE | Admit: 2019-04-09 | Discharge: 2019-04-09 | Disposition: A | Discharge status: Home / Self Care | Payer: Medicare HMO | Source: Ambulatory Visit | Attending: Surgery | Admitting: Surgery

## 2019-04-09 DIAGNOSIS — Z1159 Encounter for screening for other viral diseases: Secondary | ICD-10-CM | POA: Diagnosis not present

## 2019-04-09 DIAGNOSIS — Z01812 Encounter for preprocedural laboratory examination: Secondary | ICD-10-CM | POA: Diagnosis not present

## 2019-04-09 LAB — SARS CORONAVIRUS 2 (TAT 6-24 HRS): SARS Coronavirus 2: NEGATIVE

## 2019-04-13 ENCOUNTER — Ambulatory Visit (HOSPITAL_COMMUNITY)
Admission: RE | Admit: 2019-04-13 | Discharge: 2019-04-13 | Disposition: A | Discharge status: Home / Self Care | Payer: Medicare HMO | Attending: Surgery | Admitting: Surgery

## 2019-04-13 ENCOUNTER — Ambulatory Visit (HOSPITAL_COMMUNITY): Payer: Medicare HMO | Admitting: Certified Registered Nurse Anesthetist

## 2019-04-13 ENCOUNTER — Ambulatory Visit (HOSPITAL_COMMUNITY): Payer: Medicare HMO

## 2019-04-13 ENCOUNTER — Encounter (HOSPITAL_COMMUNITY)
Admission: RE | Disposition: A | Discharge status: Home / Self Care | Payer: Self-pay | Source: Home / Self Care | Attending: Surgery

## 2019-04-13 ENCOUNTER — Encounter (HOSPITAL_COMMUNITY): Payer: Self-pay | Admitting: Certified Registered Nurse Anesthetist

## 2019-04-13 DIAGNOSIS — K819 Cholecystitis, unspecified: Secondary | ICD-10-CM

## 2019-04-13 DIAGNOSIS — Z8249 Family history of ischemic heart disease and other diseases of the circulatory system: Secondary | ICD-10-CM | POA: Diagnosis not present

## 2019-04-13 DIAGNOSIS — M1990 Unspecified osteoarthritis, unspecified site: Secondary | ICD-10-CM | POA: Insufficient documentation

## 2019-04-13 DIAGNOSIS — K219 Gastro-esophageal reflux disease without esophagitis: Secondary | ICD-10-CM | POA: Diagnosis not present

## 2019-04-13 DIAGNOSIS — K828 Other specified diseases of gallbladder: Secondary | ICD-10-CM | POA: Diagnosis not present

## 2019-04-13 DIAGNOSIS — N926 Irregular menstruation, unspecified: Secondary | ICD-10-CM | POA: Diagnosis not present

## 2019-04-13 DIAGNOSIS — F17200 Nicotine dependence, unspecified, uncomplicated: Secondary | ICD-10-CM | POA: Insufficient documentation

## 2019-04-13 DIAGNOSIS — K8010 Calculus of gallbladder with chronic cholecystitis without obstruction: Secondary | ICD-10-CM | POA: Insufficient documentation

## 2019-04-13 DIAGNOSIS — Z79899 Other long term (current) drug therapy: Secondary | ICD-10-CM | POA: Insufficient documentation

## 2019-04-13 DIAGNOSIS — K8020 Calculus of gallbladder without cholecystitis without obstruction: Secondary | ICD-10-CM | POA: Diagnosis present

## 2019-04-13 DIAGNOSIS — K915 Postcholecystectomy syndrome: Secondary | ICD-10-CM | POA: Diagnosis not present

## 2019-04-13 HISTORY — PX: CHOLECYSTECTOMY: SHX55

## 2019-04-13 SURGERY — LAPAROSCOPIC CHOLECYSTECTOMY WITH INTRAOPERATIVE CHOLANGIOGRAM
Anesthesia: General | Site: Abdomen

## 2019-04-13 MED ORDER — ONDANSETRON HCL 4 MG/2ML IJ SOLN
INTRAMUSCULAR | Status: AC
  Filled 2019-04-13: qty 2

## 2019-04-13 MED ORDER — HYDROMORPHONE HCL 1 MG/ML IJ SOLN: 0.2500 mg | INTRAMUSCULAR | Status: DC | PRN

## 2019-04-13 MED ORDER — KETOROLAC TROMETHAMINE 30 MG/ML IJ SOLN
INTRAMUSCULAR | Status: AC
  Filled 2019-04-13: qty 1

## 2019-04-13 MED ORDER — MIDAZOLAM HCL 2 MG/2ML IJ SOLN
INTRAMUSCULAR | Status: AC
  Filled 2019-04-13: qty 2

## 2019-04-13 MED ORDER — SUCCINYLCHOLINE CHLORIDE 200 MG/10ML IV SOSY
PREFILLED_SYRINGE | INTRAVENOUS | Status: DC | PRN
  Administered 2019-04-13: 80 mg via INTRAVENOUS

## 2019-04-13 MED ORDER — ROCURONIUM BROMIDE 10 MG/ML (PF) SYRINGE
PREFILLED_SYRINGE | INTRAVENOUS | Status: AC
  Filled 2019-04-13: qty 10

## 2019-04-13 MED ORDER — SODIUM CHLORIDE 0.9 % IR SOLN
Status: DC | PRN
  Administered 2019-04-13: 1000 mL

## 2019-04-13 MED ORDER — SODIUM CHLORIDE 0.9 % IV SOLN
INTRAVENOUS | Status: DC | PRN
  Administered 2019-04-13: 9 mL

## 2019-04-13 MED ORDER — BUPIVACAINE-EPINEPHRINE 0.25% -1:200000 IJ SOLN
INTRAMUSCULAR | Status: DC | PRN
  Administered 2019-04-13: 13 mL

## 2019-04-13 MED ORDER — ONDANSETRON HCL 4 MG/2ML IJ SOLN
INTRAMUSCULAR | Status: DC | PRN
  Administered 2019-04-13: 4 mg via INTRAVENOUS

## 2019-04-13 MED ORDER — ROCURONIUM BROMIDE 10 MG/ML (PF) SYRINGE
PREFILLED_SYRINGE | INTRAVENOUS | Status: DC | PRN
  Administered 2019-04-13: 50 mg via INTRAVENOUS

## 2019-04-13 MED ORDER — ACETAMINOPHEN 500 MG PO TABS: 1000.0000 mg | ORAL_TABLET | ORAL | Status: DC

## 2019-04-13 MED ORDER — GABAPENTIN 300 MG PO CAPS
300.0000 mg | ORAL_CAPSULE | ORAL | Status: AC
  Administered 2019-04-13: 08:00:00 300 mg via ORAL

## 2019-04-13 MED ORDER — PROPOFOL 10 MG/ML IV BOLUS
INTRAVENOUS | Status: DC | PRN
  Administered 2019-04-13: 110 mg via INTRAVENOUS

## 2019-04-13 MED ORDER — CHLORHEXIDINE GLUCONATE CLOTH 2 % EX PADS: 6.0000 | MEDICATED_PAD | Freq: Once | CUTANEOUS | Status: DC

## 2019-04-13 MED ORDER — BUPIVACAINE-EPINEPHRINE (PF) 0.25% -1:200000 IJ SOLN
INTRAMUSCULAR | Status: AC
  Filled 2019-04-13: qty 30

## 2019-04-13 MED ORDER — FENTANYL CITRATE (PF) 250 MCG/5ML IJ SOLN
INTRAMUSCULAR | Status: DC | PRN
  Administered 2019-04-13 (×2): 50 ug via INTRAVENOUS
  Administered 2019-04-13: 100 ug via INTRAVENOUS
  Administered 2019-04-13: 50 ug via INTRAVENOUS

## 2019-04-13 MED ORDER — 0.9 % SODIUM CHLORIDE (POUR BTL) OPTIME
TOPICAL | Status: DC | PRN
  Administered 2019-04-13: 1000 mL

## 2019-04-13 MED ORDER — DEXAMETHASONE SODIUM PHOSPHATE 10 MG/ML IJ SOLN
INTRAMUSCULAR | Status: AC
  Filled 2019-04-13: qty 1

## 2019-04-13 MED ORDER — OXYCODONE HCL 5 MG PO TABS
5.0000 mg | ORAL_TABLET | Freq: Once | ORAL | Status: AC
  Administered 2019-04-13: 5 mg via ORAL

## 2019-04-13 MED ORDER — SUCCINYLCHOLINE CHLORIDE 200 MG/10ML IV SOSY
PREFILLED_SYRINGE | INTRAVENOUS | Status: AC
  Filled 2019-04-13: qty 10

## 2019-04-13 MED ORDER — OXYCODONE HCL 5 MG PO TABS: 5.0000 mg | ORAL_TABLET | Freq: Four times a day (QID) | ORAL | 0 refills | Status: DC | PRN

## 2019-04-13 MED ORDER — GABAPENTIN 300 MG PO CAPS
ORAL_CAPSULE | ORAL | Status: AC
  Filled 2019-04-13: qty 1

## 2019-04-13 MED ORDER — LIDOCAINE 2% (20 MG/ML) 5 ML SYRINGE
INTRAMUSCULAR | Status: DC | PRN
  Administered 2019-04-13: 60 mg via INTRAVENOUS

## 2019-04-13 MED ORDER — LIDOCAINE 2% (20 MG/ML) 5 ML SYRINGE
INTRAMUSCULAR | Status: AC
  Filled 2019-04-13: qty 5

## 2019-04-13 MED ORDER — FENTANYL CITRATE (PF) 250 MCG/5ML IJ SOLN
INTRAMUSCULAR | Status: AC
  Filled 2019-04-13: qty 5

## 2019-04-13 MED ORDER — SUGAMMADEX SODIUM 200 MG/2ML IV SOLN
INTRAVENOUS | Status: DC | PRN
  Administered 2019-04-13: 175 mg via INTRAVENOUS

## 2019-04-13 MED ORDER — OXYCODONE HCL 5 MG PO TABS
ORAL_TABLET | ORAL | Status: AC
  Filled 2019-04-13: qty 1

## 2019-04-13 MED ORDER — PROPOFOL 10 MG/ML IV BOLUS
INTRAVENOUS | Status: AC
  Filled 2019-04-13: qty 20

## 2019-04-13 MED ORDER — CEFAZOLIN SODIUM-DEXTROSE 2-4 GM/100ML-% IV SOLN
INTRAVENOUS | Status: AC
  Filled 2019-04-13: qty 100

## 2019-04-13 MED ORDER — CEFAZOLIN SODIUM-DEXTROSE 2-4 GM/100ML-% IV SOLN
2.0000 g | INTRAVENOUS | Status: AC
  Administered 2019-04-13: 2 g via INTRAVENOUS

## 2019-04-13 MED ORDER — LACTATED RINGERS IV SOLN
INTRAVENOUS | Status: DC
  Administered 2019-04-13: 09:00:00 via INTRAVENOUS

## 2019-04-13 MED ORDER — ACETAMINOPHEN 500 MG PO TABS
ORAL_TABLET | ORAL | Status: AC
  Administered 2019-04-13: 08:00:00 1000 mg
  Filled 2019-04-13: qty 2

## 2019-04-13 MED ORDER — KETOROLAC TROMETHAMINE 30 MG/ML IJ SOLN
INTRAMUSCULAR | Status: DC | PRN
  Administered 2019-04-13: 30 mg via INTRAVENOUS

## 2019-04-13 MED ORDER — DEXAMETHASONE SODIUM PHOSPHATE 10 MG/ML IJ SOLN
INTRAMUSCULAR | Status: DC | PRN
  Administered 2019-04-13: 5 mg via INTRAVENOUS

## 2019-04-13 SURGICAL SUPPLY — 43 items
APPLIER CLIP ROT 10 11.4 M/L (STAPLE) ×2
BENZOIN TINCTURE PRP APPL 2/3 (GAUZE/BANDAGES/DRESSINGS) ×2 IMPLANT
BLADE CLIPPER SURG (BLADE) IMPLANT
CANISTER SUCT 3000ML PPV (MISCELLANEOUS) ×2 IMPLANT
CHLORAPREP W/TINT 26 (MISCELLANEOUS) ×2 IMPLANT
CLIP APPLIE ROT 10 11.4 M/L (STAPLE) ×1 IMPLANT
COVER MAYO STAND STRL (DRAPES) ×2 IMPLANT
COVER SURGICAL LIGHT HANDLE (MISCELLANEOUS) ×2 IMPLANT
COVER WAND RF STERILE (DRAPES) ×2 IMPLANT
DRAPE C-ARM 42X72 X-RAY (DRAPES) ×2 IMPLANT
DRSG TEGADERM 2-3/8X2-3/4 SM (GAUZE/BANDAGES/DRESSINGS) ×4 IMPLANT
DRSG TEGADERM 4X4.75 (GAUZE/BANDAGES/DRESSINGS) ×2 IMPLANT
ELECT REM PT RETURN 9FT ADLT (ELECTROSURGICAL) ×2
ELECTRODE REM PT RTRN 9FT ADLT (ELECTROSURGICAL) ×1 IMPLANT
GAUZE SPONGE 2X2 8PLY STRL LF (GAUZE/BANDAGES/DRESSINGS) ×1 IMPLANT
GLOVE BIO SURGEON STRL SZ7 (GLOVE) ×2 IMPLANT
GLOVE BIOGEL PI IND STRL 7.5 (GLOVE) ×1 IMPLANT
GLOVE BIOGEL PI INDICATOR 7.5 (GLOVE) ×1
GOWN STRL REUS W/ TWL LRG LVL3 (GOWN DISPOSABLE) ×3 IMPLANT
GOWN STRL REUS W/TWL LRG LVL3 (GOWN DISPOSABLE) ×3
KIT BASIN OR (CUSTOM PROCEDURE TRAY) ×2 IMPLANT
KIT TURNOVER KIT B (KITS) ×2 IMPLANT
NS IRRIG 1000ML POUR BTL (IV SOLUTION) ×2 IMPLANT
PAD ARMBOARD 7.5X6 YLW CONV (MISCELLANEOUS) ×2 IMPLANT
POUCH RETRIEVAL ECOSAC 10 (ENDOMECHANICALS) IMPLANT
POUCH RETRIEVAL ECOSAC 10MM (ENDOMECHANICALS)
POUCH SPECIMEN RETRIEVAL 10MM (ENDOMECHANICALS) IMPLANT
SCISSORS LAP 5X35 DISP (ENDOMECHANICALS) ×2 IMPLANT
SET CHOLANGIOGRAPH 5 50 .035 (SET/KITS/TRAYS/PACK) ×2 IMPLANT
SET IRRIG TUBING LAPAROSCOPIC (IRRIGATION / IRRIGATOR) ×2 IMPLANT
SET TUBE SMOKE EVAC HIGH FLOW (TUBING) ×2 IMPLANT
SLEEVE ENDOPATH XCEL 5M (ENDOMECHANICALS) ×2 IMPLANT
SPECIMEN JAR SMALL (MISCELLANEOUS) ×2 IMPLANT
SPONGE GAUZE 2X2 STER 10/PKG (GAUZE/BANDAGES/DRESSINGS) ×1
STRIP CLOSURE SKIN 1/2X4 (GAUZE/BANDAGES/DRESSINGS) ×2 IMPLANT
SUT MNCRL AB 4-0 PS2 18 (SUTURE) ×2 IMPLANT
TOWEL GREEN STERILE (TOWEL DISPOSABLE) ×2 IMPLANT
TOWEL GREEN STERILE FF (TOWEL DISPOSABLE) ×2 IMPLANT
TRAY LAPAROSCOPIC MC (CUSTOM PROCEDURE TRAY) ×2 IMPLANT
TROCAR XCEL BLUNT TIP 100MML (ENDOMECHANICALS) ×2 IMPLANT
TROCAR XCEL NON-BLD 11X100MML (ENDOMECHANICALS) ×2 IMPLANT
TROCAR XCEL NON-BLD 5MMX100MML (ENDOMECHANICALS) ×2 IMPLANT
WATER STERILE IRR 1000ML POUR (IV SOLUTION) ×2 IMPLANT

## 2019-04-13 NOTE — Interval H&P Note (Signed)
History and Physical Interval Note:  04/13/2019 9:19 AM  Kelly Dixon  has presented today for surgery, with the diagnosis of [place]ervey.  The various methods of treatment have been discussed with the patient and family. After consideration of risks, benefits and other options for treatment, the patient has consented to  Procedure(s): LAPAROSCOPIC CHOLECYSTECTOMY WITH INTRAOPERATIVE CHOLANGIOGRAM (N/A) as a surgical intervention.  The patient's history has been reviewed, patient examined, no change in status, stable for surgery.  I have reviewed the patient's chart and labs.  Questions were answered to the patient's satisfaction.     [name]

## 2019-04-13 NOTE — Anesthesia Postprocedure Evaluation (Signed)
Anesthesia Post Note  Patient: Kelly Dixon  Procedure(s) Performed: LAPAROSCOPIC CHOLECYSTECTOMY WITH INTRAOPERATIVE CHOLANGIOGRAM (N/A Abdomen)     Patient location during evaluation: PACU Anesthesia Type: General Level of consciousness: awake and alert Pain management: pain level controlled Vital Signs Assessment: post-procedure vital signs reviewed and stable Respiratory status: spontaneous breathing, nonlabored ventilation and respiratory function stable Cardiovascular status: blood pressure returned to baseline and stable Postop Assessment: no apparent nausea or vomiting Anesthetic complications: no    Last Vitals:  Vitals:   04/13/19 1115 04/13/19 1130  BP: (!) 141/79 (!) 144/78  Pulse: 63 (!) 55  Resp: 13 (!) 24  Temp: 36.8 C   SpO2: 99% 98%    Last Pain:  Vitals:   04/13/19 1115  PainSc: 4                  Tyshia Fenter,W. EDMOND

## 2019-04-13 NOTE — Discharge Instructions (Signed)
[facility] ______[facility] °LAPAROSCOPIC SURGERY: POST OP INSTRUCTIONS °Always review your discharge instruction sheet given to you by the facility where your surgery was performed. °IF YOU HAVE DISABILITY OR FAMILY LEAVE FORMS, YOU MUST BRING THEM TO THE OFFICE FOR PROCESSING.   °DO NOT GIVE THEM TO YOUR DOCTOR. ° °1. A prescription for pain medication may be given to you upon discharge.  Take your pain medication as prescribed, if needed.  If narcotic pain medicine is not needed, then you may take acetaminophen (Tylenol) or ibuprofen (Advil) as needed. °2. Take your usually prescribed medications unless otherwise directed. °3. If you need a refill on your pain medication, please contact your pharmacy.  They will contact our office to request authorization. Prescriptions will not be filled after 5pm or on week-ends. °4. You should follow a light diet the first few days after arrival home, such as soup and crackers, etc.  Be sure to include lots of fluids daily. °5. Most patients will experience some swelling and bruising in the area of the incisions.  Ice packs will help.  Swelling and bruising can take several days to resolve.  °6. It is common to experience some constipation if taking pain medication after surgery.  Increasing fluid intake and taking a stool softener (such as Colace) will usually help or prevent this problem from occurring.  A mild laxative (Milk of Magnesia or Miralax) should be taken according to package instructions if there are no bowel movements after 48 hours. °7. Unless discharge instructions indicate otherwise, you may remove your bandages 24-48 hours after surgery, and you may shower at that time.  You may have steri-strips (small skin tapes) in place directly over the incision.  These strips should be left on the skin for 7-10 days.  If your surgeon used skin glue on the incision, you may shower in 24 hours.  The glue will flake off over the next 2-3 weeks.  Any sutures or  staples will be removed at the office during your follow-up visit. °8. ACTIVITIES:  You may resume regular (light) daily activities beginning the next day--such as daily self-care, walking, climbing stairs--gradually increasing activities as tolerated.  You may have sexual intercourse when it is comfortable.  Refrain from any heavy lifting or straining until approved by your doctor. °a. You may drive when you are no longer taking prescription pain medication, you can comfortably wear a seatbelt, and you can safely maneuver your car and apply brakes. °b. RETURN TO WORK:  __________________________________________________________ °9. You should see your doctor in the office for a follow-up appointment approximately 2-3 weeks after your surgery.  Make sure that you call for this appointment within a day or two after you arrive home to insure a convenient appointment time. °10. OTHER INSTRUCTIONS: __________________________________________________________________________________________________________________________ __________________________________________________________________________________________________________________________ °WHEN TO CALL YOUR DOCTOR: °1. Fever over 101.0 °2. Inability to urinate °3. Continued bleeding from incision. °4. Increased pain, redness, or drainage from the incision. °5. Increasing abdominal pain ° °The clinic staff is available to answer your questions during regular business hours.  Please don’t hesitate to call and ask to speak to one of the nurses for clinical concerns.  If you have a medical emergency, go to the nearest emergency room or call 911.  A surgeon from [facility] is always on call at the hospital. °[address], [address], [place], [place]  [address] ? [address] [address], [place], [place]   [address] °[phone] ? [phone] ? FAX [phone] °Web site:   [email or web address] °

## 2019-04-13 NOTE — Op Note (Signed)
Laparoscopic Cholecystectomy with IOC Procedure Note  Indications: This patient presents with symptomatic gallbladder disease and will undergo laparoscopic cholecystectomy.  Pre-operative Diagnosis: Calculus of gallbladder with other cholecystitis, without mention of obstruction  Post-operative Diagnosis: Same  Surgeon: [name]   Assistants: Judyann Munson, RNFA  Anesthesia: General endotracheal anesthesia  ASA Class: 1  Procedure Details  The patient was seen again in the Holding Room. The risks, benefits, complications, treatment options, and expected outcomes were discussed with the patient. The possibilities of reaction to medication, pulmonary aspiration, perforation of viscus, bleeding, recurrent infection, finding a normal gallbladder, the need for additional procedures, failure to diagnose a condition, the possible need to convert to an open procedure, and creating a complication requiring transfusion or operation were discussed with the patient. The likelihood of improving the patient's symptoms with return to their baseline status is good.  The patient and/or family concurred with the proposed plan, giving informed consent. The site of surgery properly noted. The patient was taken to Operating Room, identified as Kelly Dixon and the procedure verified as Laparoscopic Cholecystectomy with Intraoperative Cholangiogram. A Time Out was held and the above information confirmed.  Prior to the induction of general anesthesia, antibiotic prophylaxis was administered. General endotracheal anesthesia was then administered and tolerated well. After the induction, the abdomen was prepped with Chloraprep and draped in the sterile fashion. The patient was positioned in the supine position.  Local anesthetic agent was injected into the skin near the umbilicus and an incision made. We dissected down to the abdominal fascia with blunt dissection.  The fascia was incised vertically and we  entered the peritoneal cavity bluntly.  A pursestring suture of 0-Vicryl was placed around the fascial opening.  The Hasson cannula was inserted and secured with the stay suture.  Pneumoperitoneum was then created with CO2 and tolerated well without any adverse changes in the patient's vital signs. An 11-mm port was placed in the subxiphoid position.  Two 5-mm ports were placed in the right upper quadrant. All skin incisions were infiltrated with a local anesthetic agent before making the incision and placing the trocars.   We positioned the patient in reverse Trendelenburg, tilted slightly to the patient's left.  The gallbladder was identified, the fundus grasped and retracted cephalad. Adhesions were lysed bluntly and with the electrocautery where indicated, taking care not to injure any adjacent organs or viscus. The infundibulum was grasped and retracted laterally, exposing the peritoneum overlying the triangle of Calot. This was then divided and exposed in a blunt fashion. A critical view of the cystic duct and cystic artery was obtained.  The cystic duct was clearly identified and bluntly dissected circumferentially. The cystic duct was ligated with a clip distally.   An incision was made in the cystic duct and the [facility] cholangiogram catheter introduced. The catheter was secured using a clip. A cholangiogram was then obtained which showed good visualization of the distal and proximal biliary tree with no sign of filling defects or obstruction.  Contrast flowed easily into the duodenum. The catheter was then removed.   The cystic duct was then ligated with clips and divided. The cystic artery was identified, dissected free, ligated with clips and divided as well.   The gallbladder was dissected from the liver bed in retrograde fashion with the electrocautery. The gallbladder was removed and placed in an Endocatch sac. The liver bed was irrigated and inspected. Hemostasis was achieved with the  electrocautery. Copious irrigation was utilized and was repeatedly  aspirated until clear.  The gallbladder and Endocatch sac were then removed through the umbilical port site.  The pursestring suture was used to close the umbilical fascia.    We again inspected the right upper quadrant for hemostasis.  Pneumoperitoneum was released as we removed the trocars.  4-0 Monocryl was used to close the skin.   Benzoin, steri-strips, and clean dressings were applied. The patient was then extubated and brought to the recovery room in stable condition. Instrument, sponge, and needle counts were correct at closure and at the conclusion of the case.   Findings: Cholecystitis with Cholelithiasis  Estimated Blood Loss: Minimal         Drains: none         Specimens: Gallbladder           Complications: None; patient tolerated the procedure well.         Disposition: PACU - hemodynamically stable.         Condition: stable  Imogene Burn. Georgette Dover, MD, [facility]per [phone]  04/13/2019 10:52 AM

## 2019-04-13 NOTE — Anesthesia Preprocedure Evaluation (Addendum)
Anesthesia Evaluation  Patient identified by MRN, date of birth, ID band Patient awake    Reviewed: Allergy & Precautions, H&P , NPO status , Patient's Chart, lab work & pertinent test results  History of Anesthesia Complications (+) PONV  Airway Mallampati: II  TM Distance: >3 FB Neck ROM: Full    Dental no notable dental hx. (+) Edentulous Upper, Partial Lower, Dental Advisory Given   Pulmonary Current Smoker,    Pulmonary exam normal breath sounds clear to auscultation       Cardiovascular negative cardio ROS   Rhythm:Regular Rate:Normal     Neuro/Psych negative neurological ROS  negative psych ROS   GI/Hepatic Neg liver ROS, GERD  ,  Endo/Other  negative endocrine ROS  Renal/GU negative Renal ROS  negative genitourinary   Musculoskeletal  (+) Arthritis , Osteoarthritis,    Abdominal   Peds  Hematology negative hematology ROS (+)   Anesthesia Other Findings   Reproductive/Obstetrics negative OB ROS                            Anesthesia Physical Anesthesia Plan  ASA: II  Anesthesia Plan: General   Post-op Pain Management:    Induction: Intravenous  PONV Risk Score and Plan: 4 or greater and Ondansetron, Dexamethasone and Midazolam  Airway Management Planned: Oral ETT  Additional Equipment:   Intra-op Plan:   Post-operative Plan: Extubation in OR  Informed Consent: I have reviewed the patients History and Physical, chart, labs and discussed the procedure including the risks, benefits and alternatives for the proposed anesthesia with the patient or authorized representative who has indicated his/her understanding and acceptance.     Dental advisory given  Plan Discussed with: CRNA  Anesthesia Plan Comments:         Anesthesia Quick Evaluation

## 2019-04-13 NOTE — Anesthesia Procedure Notes (Signed)
Procedure Name: Intubation Date/Time: 04/13/2019 10:04 AM Performed by: Colin Benton, CRNA Pre-anesthesia Checklist: Emergency Drugs available, Suction available, Patient identified and Patient being monitored Patient Re-evaluated:Patient Re-evaluated prior to induction Oxygen Delivery Method: Circle system utilized Preoxygenation: Pre-oxygenation with 100% oxygen Induction Type: IV induction Ventilation: Mask ventilation without difficulty Laryngoscope Size: Miller and 2 Grade View: Grade I Tube type: Oral Tube size: 7.0 mm Number of attempts: 1 Airway Equipment and Method: Stylet Placement Confirmation: ETT inserted through vocal cords under direct vision,  positive ETCO2 and breath sounds checked- equal and bilateral Secured at: 22 cm Tube secured with: Tape Dental Injury: Teeth and Oropharynx as per pre-operative assessment

## 2019-04-13 NOTE — Transfer of Care (Signed)
Immediate Anesthesia Transfer of Care Note  Patient: Kelly Dixon  Procedure(s) Performed: LAPAROSCOPIC CHOLECYSTECTOMY WITH INTRAOPERATIVE CHOLANGIOGRAM (N/A Abdomen)  Patient Location: PACU  Anesthesia Type:General  Level of Consciousness: awake, alert , oriented and patient cooperative  Airway & Oxygen Therapy: Patient Spontanous Breathing and Patient connected to nasal cannula oxygen  Post-op Assessment: Report given to RN, Post -op Vital signs reviewed and stable and Patient moving all extremities X 4  Post vital signs: Reviewed and stable  Last Vitals:  Vitals Value Taken Time  BP 144/72 04/13/19 1105  Temp    Pulse 65 04/13/19 1112  Resp 18 04/13/19 1112  SpO2 100 % 04/13/19 1112  Vitals shown include unvalidated device data.  Last Pain: There were no vitals filed for this visit.       Complications: No apparent anesthesia complications

## 2019-04-14 ENCOUNTER — Encounter (HOSPITAL_COMMUNITY): Payer: Self-pay | Admitting: Surgery

## 2019-05-31 ENCOUNTER — Ambulatory Visit (INDEPENDENT_AMBULATORY_CARE_PROVIDER_SITE_OTHER): Payer: Medicare HMO | Admitting: Family Medicine

## 2019-05-31 ENCOUNTER — Encounter: Payer: Self-pay | Admitting: Family Medicine

## 2019-05-31 ENCOUNTER — Other Ambulatory Visit: Payer: Self-pay

## 2019-05-31 VITALS — BP 118/80 | HR 86 | Temp 96.5°F | Ht 62.0 in | Wt 188.4 lb

## 2019-05-31 DIAGNOSIS — M170 Bilateral primary osteoarthritis of knee: Secondary | ICD-10-CM | POA: Diagnosis not present

## 2019-05-31 DIAGNOSIS — Z0000 Encounter for general adult medical examination without abnormal findings: Secondary | ICD-10-CM

## 2019-05-31 MED ORDER — MELOXICAM 7.5 MG PO TABS: 7.5000 mg | ORAL_TABLET | Freq: Every day | ORAL | 0 refills | Status: DC

## 2019-05-31 NOTE — Patient Instructions (Addendum)
Give [place] 2-3 business days to get the results of your labs back.   Keep the diet clean and stay active.  Consider yoga.  I recommend getting the flu shot in mid October. This suggestion would change if the CDC comes out with a different recommendation.    Ask your insurance company if they cover the pneumonia vaccine (PCV23).   Ask the insurance where they want the labs resulted and we can order them.   The new Shingrix vaccine (for shingles) is a 2 shot series. It can make people feel low energy, achy and almost like they have the flu for 48 hours after injection. Please plan accordingly when deciding on when to get this shot. Call your pharmacy for an appointment to get this. The second shot of the series is less severe regarding the side effects, but it still lasts 48 hours.   Check with your insurance to see if they cover a steroid/cortisone injection for your knee joints.   Look into intermittent fasting and a Pescetarian diet.   Let us know if you need anything.  Knee Exercises It is normal to feel mild stretching, pulling, tightness, or discomfort as you do these exercises, but you should stop right away if you feel sudden pain or your pain gets worse. STRETCHING AND RANGE OF MOTION EXERCISES  These exercises warm up your muscles and joints and improve the movement and flexibility of your knee. These exercises also help to relieve pain, numbness, and tingling. Exercise A: Knee Extension, Prone  1. Lie on your abdomen on a bed. 2. Place your left / right knee just beyond the edge of the surface so your knee is not on the bed. You can put a towel under your left / right thigh just above your knee for comfort. 3. Relax your leg muscles and allow gravity to straighten your knee. You should feel a stretch behind your left / right knee. 4. Hold this position for 30 seconds. 5. Scoot up so your knee is supported between repetitions. Repeat 2 times. Complete this stretch 3 times per  week. Exercise B: Knee Flexion, Active    1. Lie on your back with both knees straight. If this causes back discomfort, bend your left / right knee so your foot is flat on the floor. 2. Slowly slide your left / right heel back toward your buttocks until you feel a gentle stretch in the front of your knee or thigh. 3. Hold this position for 30 seconds. 4. Slowly slide your left / right heel back to the starting position. Repeat 2 times. Complete this exercise 3 times per week. Exercise C: Quadriceps, Prone    1. Lie on your abdomen on a firm surface, such as a bed or padded floor. 2. Bend your left / right knee and hold your ankle. If you cannot reach your ankle or pant leg, loop a belt around your foot and grab the belt instead. 3. Gently pull your heel toward your buttocks. Your knee should not slide out to the side. You should feel a stretch in the front of your thigh and knee. 4. Hold this position for 30 seconds. Repeat 2 times. Complete this stretch 3 times per week. Exercise D: Hamstring, Supine  1. Lie on your back. 2. Loop a belt or towel over the ball of your left / right foot. The ball of your foot is on the walking surface, right under your toes. 3. Straighten your left / right knee and  slowly pull on the belt to raise your leg until you feel a gentle stretch behind your knee. ? Do not let your left / right knee bend while you do this. ? Keep your other leg flat on the floor. 4. Hold this position for 30 seconds. Repeat 2 times. Complete this stretch 3 times per week. STRENGTHENING EXERCISES  These exercises build strength and endurance in your knee. Endurance is the ability to use your muscles for a long time, even after they get tired. Exercise E: Quadriceps, Isometric    1. Lie on your back with your left / right leg extended and your other knee bent. Put a rolled towel or small pillow under your knee if told by your health care provider. 2. Slowly tense the muscles in  the front of your left / right thigh. You should see your kneecap slide up toward your hip or see increased dimpling just above the knee. This motion will push the back of the knee toward the floor. 3. For 3 seconds, keep the muscle as tight as you can without increasing your pain. 4. Relax the muscles slowly and completely. Repeat for 10 total reps Repeat 2 ti mes. Complete this exercise 3 times per week. Exercise F: Straight Leg Raises - Quadriceps  1. Lie on your back with your left / right leg extended and your other knee bent. 2. Tense the muscles in the front of your left / right thigh. You should see your kneecap slide up or see increased dimpling just above the knee. Your thigh may even shake a bit. 3. Keep these muscles tight as you raise your leg 4-6 inches (10-15 cm) off the floor. Do not let your knee bend. 4. Hold this position for 3 seconds. 5. Keep these muscles tense as you lower your leg. 6. Relax your muscles slowly and completely after each repetition. 10 total reps. Repeat 2 times. Complete this exercise 3 times per week.  Exercise G: Hamstring Curls    If told by your health care provider, do this exercise while wearing ankle weights. Begin with 5 lb weights (optional). Then increase the weight by 1 lb (0.5 kg) increments. Do not wear ankle weights that are more than 20 lbs to start with. 1. Lie on your abdomen with your legs straight. 2. Bend your left / right knee as far as you can without feeling pain. Keep your hips flat against the floor. 3. Hold this position for 3 seconds. 4. Slowly lower your leg to the starting position. Repeat for 10 reps.  Repeat 2 times. Complete this exercise 3 times per week. Exercise H: Squats (Quadriceps)  1. Stand in front of a table, with your feet and knees pointing straight ahead. You may rest your hands on the table for balance but not for support. 2. Slowly bend your knees and lower your hips like you are going to sit in a  chair. ? Keep your weight over your heels, not over your toes. ? Keep your lower legs upright so they are parallel with the table legs. ? Do not let your hips go lower than your knees. ? Do not bend lower than told by your health care provider. ? If your knee pain increases, do not bend as low. 3. Hold the squat position for 1 second. 4. Slowly push with your legs to return to standing. Do not use your hands to pull yourself to standing. Repeat 2 times. Complete this exercise 3 times per week. Exercise  I: Wall Slides (Quadriceps)    1. Lean your back against a smooth wall or door while you walk your feet out 18-24 inches (46-61 cm) from it. 2. Place your feet hip-width apart. 3. Slowly slide down the wall or door until your knees Repeat 2 times. Complete this exercise every other day. 4. Exercise K: Straight Leg Raises - Hip Abductors  1. Lie on your side with your left / right leg in the top position. Lie so your head, shoulder, knee, and hip line up. You may bend your bottom knee to help you keep your balance. 2. Roll your hips slightly forward so your hips are stacked directly over each other and your left / right knee is facing forward. 3. Leading with your heel, lift your top leg 4-6 inches (10-15 cm). You should feel the muscles in your outer hip lifting. ? Do not let your foot drift forward. ? Do not let your knee roll toward the ceiling. 4. Hold this position for 3 seconds. 5. Slowly return your leg to the starting position. 6. Let your muscles relax completely after each repetition. 10 total reps. Repeat 2 times. Complete this exercise 3 times per week. Exercise J: Straight Leg Raises - Hip Extensors  1. Lie on your abdomen on a firm surface. You can put a pillow under your hips if that is more comfortable. 2. Tense the muscles in your buttocks and lift your left / right leg about 4-6 inches (10-15 cm). Keep your knee straight as you lift your leg. 3. Hold this position for 3  seconds. 4. Slowly lower your leg to the starting position. 5. Let your leg relax completely after each repetition. Repeat 2 times. Complete this exercise 3 times per week. Document Released: 07/31/2005 Document Revised: 06/10/2016 Document Reviewed: 07/23/2015 Elsevier Interactive Patient Education  2017 [employer].

## 2019-05-31 NOTE — Progress Notes (Signed)
Chief Complaint  Patient presents with  . Annual Exam     Well Woman DALLIE ISACKSON is here for a complete physical.   Her last physical was >1 year ago.  Current diet: in general, diet could be better. Current exercise: got a small trampoline. Weight is increasing and she denies daytime fatigue. No LMP recorded. Patient is postmenopausal.  Seatbelt? Yes  Health Maintenance Colonoscopy- Yes Shingrix- No  DEXA- Yes Mammogram- Yes Tetanus- Yes Pneumonia- checking w insurance Hep C screen- Yes  Past Medical History:  Diagnosis Date  . Arthritis   . Gallstones 05/31/2018  . GERD (gastroesophageal reflux disease)   . PONV (postoperative nausea and vomiting)   . Syphilis      Past Surgical History:  Procedure Laterality Date  . CHOLECYSTECTOMY N/A 04/13/2019   Procedure: LAPAROSCOPIC CHOLECYSTECTOMY WITH INTRAOPERATIVE CHOLANGIOGRAM;  Surgeon: Donnie Mesa, MD;  Location: [facility];  Service: General;  Laterality: N/A;  . NO PAST SURGERIES      Medications  Current Outpatient Medications on File Prior to Visit  Medication Sig Dispense Refill  . loratadine (CLARITIN) 10 MG tablet Take 10 mg by mouth daily.     Allergies No Known Allergies  Review of Systems: Constitutional:  no sweats Eye:  no recent significant change in vision Ear/Nose/Mouth/Throat:  Ears:  No changes in hearing Nose/Mouth/Throat:  no complaints of nasal congestion, no sore throat Cardiovascular: no chest pain Respiratory:  No cough and no shortness of breath Gastrointestinal:  no abdominal pain, no change in bowel habits GU:  Female: negative for dysuria or pelvic pain Musculoskeletal/Extremities: +b/l knee pain; otherwise no pain of the joints Integumentary (Skin/Breast):  no abnormal skin lesions reported Neurologic:  no headaches Psychiatric:  no anxiety, no depression Endocrine:  denies unexplained weight changes Hematologic/Lymphatic:  no abnormal bleeding  Exam BP 118/80 (BP Location:  Left Arm, Patient Position: Sitting, Cuff Size: Normal)   Pulse 86   Temp (!) 96.5 F (35.8 C) (Temporal)   Ht 5\' 2"  (1.575 m)   Wt 188 lb 6 oz (85.4 kg)   SpO2 95%   BMI 34.45 kg/m  General:  well developed, well nourished, in no apparent distress Skin:  no significant moles, warts, or growths Head:  no masses, lesions, or tenderness Eyes:  pupils equal and round, sclera anicteric without injection Ears:  canals without lesions, TMs shiny without retraction, no obvious effusion, no erythema Nose:  nares patent, septum midline, mucosa normal, and no drainage or sinus tenderness Throat/Pharynx:  lips and gingiva without lesion; tongue and uvula midline; non-inflamed pharynx; no exudates or postnasal drainage Neck: neck supple without adenopathy, thyromegaly, or masses Lungs:  clear to auscultation, breath sounds equal bilaterally, no respiratory distress Cardio:  regular rate and rhythm, no bruits or LE edema Abdomen:  abdomen soft, nontender; bowel sounds normal; no masses or organomegaly Genital: Deferred Musculoskeletal:  symmetrical muscle groups noted without atrophy or deformity Extremities:  no clubbing, cyanosis, or edema, no deformities, no skin discoloration Neuro:  gait normal; deep tendon reflexes normal and symmetric Psych: well oriented with normal range of affect and appropriate judgment/insight  Assessment and Plan  Well adult exam - Plan: CANCELED: CBC, CANCELED: Comprehensive metabolic panel, CANCELED: Lipid panel  Primary osteoarthritis of both knees - Plan: meloxicam (MOBIC) 7.5 MG tablet   Well 73 y.o. female. Counseled on diet and exercise.  Other orders as above. She is going to contact her ins co about PCV23, Shingrix, knee injections and what lab co  she needs [place] to send her blood work to.  Follow up in 1 year pending the above workup. The patient voiced understanding and agreement to the plan.  [place], DO 05/31/19 7:32 AM

## 2019-06-28 ENCOUNTER — Telehealth: Payer: Medicare HMO | Admitting: Physician Assistant

## 2019-06-28 DIAGNOSIS — R399 Unspecified symptoms and signs involving the genitourinary system: Secondary | ICD-10-CM

## 2019-06-28 MED ORDER — CEPHALEXIN 500 MG PO CAPS: 500.0000 mg | ORAL_CAPSULE | Freq: Two times a day (BID) | ORAL | 0 refills | Status: AC

## 2019-06-28 NOTE — Progress Notes (Signed)

## 2019-07-19 ENCOUNTER — Other Ambulatory Visit: Payer: Self-pay | Admitting: Family Medicine

## 2019-07-19 DIAGNOSIS — Z1231 Encounter for screening mammogram for malignant neoplasm of breast: Secondary | ICD-10-CM

## 2019-07-19 DIAGNOSIS — Z0000 Encounter for general adult medical examination without abnormal findings: Secondary | ICD-10-CM

## 2019-07-19 NOTE — Progress Notes (Signed)
cb

## 2019-07-22 ENCOUNTER — Other Ambulatory Visit (INDEPENDENT_AMBULATORY_CARE_PROVIDER_SITE_OTHER): Payer: Medicare HMO

## 2019-07-22 ENCOUNTER — Other Ambulatory Visit: Payer: Self-pay

## 2019-07-22 DIAGNOSIS — E785 Hyperlipidemia, unspecified: Secondary | ICD-10-CM | POA: Diagnosis not present

## 2019-07-22 DIAGNOSIS — Z0000 Encounter for general adult medical examination without abnormal findings: Secondary | ICD-10-CM

## 2019-07-22 NOTE — Addendum Note (Signed)
Addended by: Caffie Pinto on: 07/22/2019 08:23 AM   Modules accepted: Orders

## 2019-07-23 LAB — CBC
HCT: 38.8 % (ref 35.0–45.0)
Hemoglobin: 13.1 g/dL (ref 11.7–15.5)
MCH: 30.3 pg (ref 27.0–33.0)
MCHC: 33.8 g/dL (ref 32.0–36.0)
MCV: 89.6 fL (ref 80.0–100.0)
MPV: 10.6 fL (ref 7.5–12.5)
Platelets: 214 10*3/uL (ref 140–400)
RBC: 4.33 10*6/uL (ref 3.80–5.10)
RDW: 11.9 % (ref 11.0–15.0)
WBC: 4.1 10*3/uL (ref 3.8–10.8)

## 2019-07-23 LAB — COMPREHENSIVE METABOLIC PANEL
AG Ratio: 1.8 (calc) (ref 1.0–2.5)
ALT: 9 U/L (ref 6–29)
AST: 13 U/L (ref 10–35)
Albumin: 4.4 g/dL (ref 3.6–5.1)
Alkaline phosphatase (APISO): 65 U/L (ref 37–153)
BUN: 13 mg/dL (ref 7–25)
CO2: 27 mmol/L (ref 20–32)
Calcium: 9.2 mg/dL (ref 8.6–10.4)
Chloride: 106 mmol/L (ref 98–110)
Creat: 0.79 mg/dL (ref 0.60–0.93)
Globulin: 2.5 g/dL (calc) (ref 1.9–3.7)
Glucose, Bld: 101 mg/dL — ABNORMAL HIGH (ref 65–99)
Potassium: 4.3 mmol/L (ref 3.5–5.3)
Sodium: 142 mmol/L (ref 135–146)
Total Bilirubin: 0.6 mg/dL (ref 0.2–1.2)
Total Protein: 6.9 g/dL (ref 6.1–8.1)

## 2019-07-23 LAB — LIPID PANEL
Cholesterol: 205 mg/dL — ABNORMAL HIGH (ref <200)
HDL: 49 mg/dL — ABNORMAL LOW (ref >=50)
LDL Cholesterol (Calc): 135 mg/dL (calc) — ABNORMAL HIGH
Non-HDL Cholesterol (Calc): 156 mg/dL (calc) — ABNORMAL HIGH (ref <130)
Total CHOL/HDL Ratio: 4.2 (calc) (ref <5.0)
Triglycerides: 99 mg/dL (ref <150)

## 2019-07-28 ENCOUNTER — Other Ambulatory Visit: Payer: Self-pay

## 2019-07-28 ENCOUNTER — Ambulatory Visit (HOSPITAL_BASED_OUTPATIENT_CLINIC_OR_DEPARTMENT_OTHER)
Admission: RE | Admit: 2019-07-28 | Discharge: 2019-07-28 | Disposition: A | Discharge status: Home / Self Care | Payer: Medicare HMO | Source: Ambulatory Visit | Attending: Family Medicine | Admitting: Family Medicine

## 2019-07-28 DIAGNOSIS — Z1231 Encounter for screening mammogram for malignant neoplasm of breast: Secondary | ICD-10-CM | POA: Insufficient documentation

## 2019-07-30 ENCOUNTER — Other Ambulatory Visit: Payer: Self-pay | Admitting: Family Medicine

## 2019-07-30 DIAGNOSIS — R928 Other abnormal and inconclusive findings on diagnostic imaging of breast: Secondary | ICD-10-CM

## 2019-08-09 ENCOUNTER — Other Ambulatory Visit: Payer: Self-pay | Admitting: Family Medicine

## 2019-08-09 ENCOUNTER — Ambulatory Visit
Admission: RE | Admit: 2019-08-09 | Discharge: 2019-08-09 | Disposition: A | Discharge status: Home / Self Care | Payer: Medicare HMO | Source: Ambulatory Visit | Attending: Family Medicine | Admitting: Family Medicine

## 2019-08-09 ENCOUNTER — Other Ambulatory Visit: Payer: Self-pay

## 2019-08-09 DIAGNOSIS — R928 Other abnormal and inconclusive findings on diagnostic imaging of breast: Secondary | ICD-10-CM | POA: Diagnosis not present

## 2019-08-09 DIAGNOSIS — N6310 Unspecified lump in the right breast, unspecified quadrant: Secondary | ICD-10-CM

## 2019-08-09 DIAGNOSIS — N6311 Unspecified lump in the right breast, upper outer quadrant: Secondary | ICD-10-CM | POA: Diagnosis not present

## 2019-08-09 DIAGNOSIS — N6489 Other specified disorders of breast: Secondary | ICD-10-CM | POA: Diagnosis not present

## 2019-08-12 DIAGNOSIS — Z20828 Contact with and (suspected) exposure to other viral communicable diseases: Secondary | ICD-10-CM | POA: Diagnosis not present

## 2019-08-12 DIAGNOSIS — Z7189 Other specified counseling: Secondary | ICD-10-CM | POA: Diagnosis not present

## 2019-08-15 DIAGNOSIS — Z20828 Contact with and (suspected) exposure to other viral communicable diseases: Secondary | ICD-10-CM | POA: Diagnosis not present

## 2019-08-16 ENCOUNTER — Other Ambulatory Visit: Payer: Medicare HMO

## 2019-08-31 ENCOUNTER — Ambulatory Visit
Admission: RE | Admit: 2019-08-31 | Discharge: 2019-08-31 | Disposition: A | Discharge status: Home / Self Care | Payer: Medicare HMO | Source: Ambulatory Visit | Attending: Family Medicine | Admitting: Family Medicine

## 2019-08-31 ENCOUNTER — Other Ambulatory Visit: Payer: Self-pay

## 2019-08-31 DIAGNOSIS — N6310 Unspecified lump in the right breast, unspecified quadrant: Secondary | ICD-10-CM

## 2019-08-31 DIAGNOSIS — N6311 Unspecified lump in the right breast, upper outer quadrant: Secondary | ICD-10-CM | POA: Diagnosis not present

## 2019-08-31 DIAGNOSIS — C50411 Malignant neoplasm of upper-outer quadrant of right female breast: Secondary | ICD-10-CM | POA: Diagnosis not present

## 2019-08-31 HISTORY — PX: BREAST BIOPSY: SHX20

## 2019-08-31 HISTORY — PX: BREAST LUMPECTOMY: SHX2

## 2019-09-01 ENCOUNTER — Other Ambulatory Visit: Payer: Self-pay | Admitting: Family Medicine

## 2019-09-01 DIAGNOSIS — N63 Unspecified lump in unspecified breast: Secondary | ICD-10-CM

## 2019-09-06 ENCOUNTER — Ambulatory Visit
Admission: RE | Admit: 2019-09-06 | Discharge: 2019-09-06 | Disposition: A | Discharge status: Home / Self Care | Payer: Medicare HMO | Source: Ambulatory Visit | Attending: Family Medicine | Admitting: Family Medicine

## 2019-09-06 ENCOUNTER — Other Ambulatory Visit: Payer: Self-pay

## 2019-09-06 ENCOUNTER — Other Ambulatory Visit: Payer: Self-pay | Admitting: Family Medicine

## 2019-09-06 DIAGNOSIS — N6311 Unspecified lump in the right breast, upper outer quadrant: Secondary | ICD-10-CM | POA: Diagnosis not present

## 2019-09-06 DIAGNOSIS — N63 Unspecified lump in unspecified breast: Secondary | ICD-10-CM

## 2019-09-06 DIAGNOSIS — C50911 Malignant neoplasm of unspecified site of right female breast: Secondary | ICD-10-CM | POA: Diagnosis not present

## 2019-09-06 DIAGNOSIS — N6310 Unspecified lump in the right breast, unspecified quadrant: Secondary | ICD-10-CM | POA: Diagnosis not present

## 2019-09-06 DIAGNOSIS — N6011 Diffuse cystic mastopathy of right breast: Secondary | ICD-10-CM | POA: Diagnosis not present

## 2019-09-07 ENCOUNTER — Ambulatory Visit: Payer: Self-pay | Admitting: Surgery

## 2019-09-07 DIAGNOSIS — C50911 Malignant neoplasm of unspecified site of right female breast: Secondary | ICD-10-CM | POA: Diagnosis not present

## 2019-09-07 NOTE — H&P (Signed)
History of Present Illness Kelly Dixon. Kelly Teall MD; 09/07/2019 5:24 PM) The patient is a 73 year old female who presents with breast cancer. PCP - Kelly Dixon  This is a 73 year old female who is status post laparoscopic cholecystectomy in July 2020 who recently underwent routine annual screening mammogram. She was found to have some asymmetry in the right breast. She underwent diagnostic mammogram and ultrasound that showed a suspicious 7 x 8 x 5 mm mass in the right upper outer quadrant at 10:00 5 cm from the nipple. She underwent biopsy of this area that revealed invasive ductal carcinoma grade 2 and DCIS. ER/PR positive. HER-2 negative. Ki-67 15% she had 2 additional areas in the right breast that showed up on ultrasound. These were both biopsied and revealed fibrocystic changes and usual ductal hyperplasia. These were felt to be concordant.  Menarche age 27 First pregnancy age 66 Breast-feed no Hormones- oral contraceptives for 3 years and hormone replacement for 6 months after menopause Menopause age 56 Family history negative for breast cancer  CLINICAL DATA: Screening. EXAM: DIGITAL SCREENING BILATERAL MAMMOGRAM WITH TOMO AND CAD COMPARISON: Previous exam(s). ACR Breast Density Category c: The breast tissue is heterogeneously dense, which may obscure small masses. FINDINGS: In the right breast, a possible asymmetry warrants further evaluation. In the left breast, no findings suspicious for malignancy. Images were processed with CAD. IMPRESSION: Further evaluation is suggested for possible asymmetry in the right breast. RECOMMENDATION: Diagnostic mammogram and possibly ultrasound of the right breast. (Code:[id number]) The patient will be contacted regarding the findings, and additional imaging will be scheduled. BI-RADS CATEGORY 0: Incomplete. Need additional imaging evaluation and/or prior mammograms for comparison. Electronically Signed By: Kelly Dixon M.D. On:  07/28/2019 13:47   CLINICAL DATA: Patient returns after screening study for evaluation of possible RIGHT breast asymmetry. EXAM: DIGITAL DIAGNOSTIC RIGHT MAMMOGRAM WITH CAD AND TOMO ULTRASOUND RIGHT BREAST COMPARISON: 07/28/2019, 07/27/2018 ACR Breast Density Category b: There are scattered areas of fibroglandular density. FINDINGS: Additional 2-D and 3-D images are performed. These views confirm presence of an indistinct mass in the UPPER-OUTER QUADRANT of the RIGHT breast and further evaluated with ultrasound. Mammographic images were processed with CAD. On physical exam, I palpate no abnormality in the UPPER-OUTER QUADRANT of the RIGHT breast. Targeted ultrasound is performed, showing an irregular hypoechoic mass with angular margins in the 10 o'clock location of the RIGHT breast 5 centimeters from nipple measuring 0.7 x 0.8 x 0.5 centimeters. No posterior acoustic features. There is internal blood flow on Doppler evaluation. Evaluation of the RIGHT axilla is negative for adenopathy. IMPRESSION: Suspicious mass the 10 o'clock location of the RIGHT breast for which biopsy is recommended. RECOMMENDATION: Ultrasound-guided core biopsy RIGHT breast. Biopsy has been scheduled at the request of the patient for 08/16/2019, to give her time to followup with her insurance company. I have discussed the findings and recommendations with the patient. If applicable, a reminder letter will be sent to the patient regarding the next appointment. BI-RADS CATEGORY 4: Suspicious. Electronically Signed By: Kelly Dixon M.D. On: 08/09/2019 15:08  Result History  MM DIAG BREAST TOMO UNI RIGHT (Order #[id number]) on 08/09/2019 - Order Result History Report <epic:EPIC?REPORT&MR_REPORTS&[id number],ORD,[id number]>  US BREAST LTD UNI RIGHT INC AXILLA (Order [id number]) - Reflex for Order [id number] Study Result  CLINICAL DATA: Patient returns after screening study for evaluation of  possible RIGHT breast asymmetry. EXAM: DIGITAL DIAGNOSTIC RIGHT MAMMOGRAM WITH CAD AND TOMO ULTRASOUND RIGHT BREAST COMPARISON: 07/28/2019, 07/27/2018 ACR Breast Density Category b: There are scattered areas of fibroglandular  density. FINDINGS: Additional 2-D and 3-D images are performed. These views confirm presence of an indistinct mass in the UPPER-OUTER QUADRANT of the RIGHT breast and further evaluated with ultrasound. Mammographic images were processed with CAD. On physical exam, I palpate no abnormality in the UPPER-OUTER QUADRANT of the RIGHT breast. Targeted ultrasound is performed, showing an irregular hypoechoic mass with angular margins in the 10 o'clock location of the RIGHT breast 5 centimeters from nipple measuring 0.7 x 0.8 x 0.5 centimeters. No posterior acoustic features. There is internal blood flow on Doppler evaluation. Evaluation of the RIGHT axilla is negative for adenopathy. IMPRESSION: Suspicious mass the 10 o'clock location of the RIGHT breast for which biopsy is recommended. RECOMMENDATION: Ultrasound-guided core biopsy RIGHT breast. Biopsy has been scheduled at the request of the patient for 08/16/2019, to give her time to followup with her insurance company. I have discussed the findings and recommendations with the patient. If applicable, a reminder letter will be sent to the patient regarding the next appointment. BI-RADS CATEGORY 4: Suspicious. Electronically Signed By: Kelly Dixon M.D. On: 08/09/2019 15:08  CLINICAL DATA: 73 year old female presenting for ultrasound-guided biopsy of a right breast mass.  EXAM: ULTRASOUND GUIDED RIGHT BREAST CORE NEEDLE BIOPSY  COMPARISON: Previous exam(s).  FINDINGS: I met with the patient and we discussed the procedure of ultrasound-guided biopsy, including benefits and alternatives. We discussed the high likelihood of a successful procedure. We discussed the risks of the procedure, including  infection, bleeding, tissue injury, clip migration, and inadequate sampling. Informed written consent was given. The usual time-out protocol was performed immediately prior to the procedure.  While scanning in preparation for the biopsy, 2 masses were identified, in addition to the mass recommended for biopsy at 10 o'clock, 5 cm from the nipple.  One of the additional masses is at 10 o'clock, 6 cm from the nipple and measures 1.0 x 0.4 x 0.9 cm. Together with the mass recommended for biopsy, this occupies 2.2 cm span.  The second additional mass is seen at 11 o'clock, 4 cm from the nipple and measures 1.2 x 0.4 x 0.9 cm. Together with the mass recommended for biopsy, this spans 3.8 cm.  Lesion quadrant: Upper-outer quadrant  Using sterile technique and 1% Lidocaine as local anesthetic, under direct ultrasound visualization, a 14 gauge spring-loaded device was used to perform biopsy of a mass in the right breast at 10 o'clock, 5 cm from the nipple using an inferior approach. At the conclusion of the procedure ribbon shaped tissue marker clip was deployed into the biopsy cavity. Follow up 2 view mammogram was performed and dictated separately.  IMPRESSION: 1. Ultrasound guided biopsy of a right breast mass at 10 o'clock. No apparent complications.  2. In preparation for the biopsy, 2 additional masses were seen in the upper inner quadrant of the right breast, one at 10 o'clock and one at 11 o'clock. If the above biopsy is benign, a six-month follow-up will be recommended for these 2 additional masses. If surgery is necessary based on pathology results, ultrasound-guided biopsy should be performed for these 2 additional masses.  Electronically Signed: By: Ammie Ferrier M.D. On: 08/31/2019 13:41  ADDENDUM: Pathology revealed GRADE II INVASIVE DUCTAL CARCINOMA, DUCTAL CARCINOMA IN SITU of the RIGHT breast, 10 o'clock. This was found to be concordant by Dr. Ammie Ferrier.  Pathology results were discussed with the patient by telephone. The patient reported doing well after the biopsy with tenderness at the site. Post biopsy instructions and care were reviewed and  questions were answered. The patient was encouraged to call [facility] for any additional concerns.  Per patient request, surgical consultation has been arranged with Dr. Donnie Mesa at [facility] on September 13, 2019. Ultrasound-guided biopsy for the 2 additional masses in RIGHT breast is scheduled for September 06, 2019 at [facility].  Pathology results reported by Stacie Acres, RN on 09/01/2019.   Electronically Signed By: Ammie Ferrier M.D. On: 09/01/2019 14:21   Problem List/Past Medical Rodman Key K. Kooper Chriswell, MD; 09/07/2019 5:24 PM) CHRONIC CHOLECYSTITIS WITH CALCULUS (K80.10) INVASIVE DUCTAL CARCINOMA OF RIGHT BREAST IN FEMALE (C50.911)  Past Surgical History (Demarquez Ciolek K. Akeya Ryther, MD; 09/07/2019 5:24 PM) No pertinent past surgical history Oral Surgery  Diagnostic Studies History (Mackenna Kamer K. Gaylan Fauver, MD; 09/07/2019 5:24 PM) Colonoscopy never Mammogram within last year Pap Smear >5 years ago  Allergies Rodman Key K. Janesha Brissette, MD; 09/07/2019 5:24 PM) No Known Drug Allergies [03/17/2019]:  Medication History Mammie Lorenzo, LPN; [birth year]79 1:91 PM) Meloxicam (7.5MG Tablet, Oral) Active. Loratadine (10MG Tablet, Oral) Active. Medications Reconciled  Social History Kelly Dixon. Cache Bills, MD; 09/07/2019 5:24 PM) Alcohol use Occasional alcohol use. Caffeine use Carbonated beverages. No caffeine use No drug use Tobacco use Current some day smoker.  Family History Kelly Dixon. Timothea Bodenheimer, MD; 09/07/2019 5:24 PM) Heart Disease Father.  Pregnancy / Birth History Kelly Dixon. Regis Wiland, MD; 09/07/2019 5:24 PM) Age at menarche 30 years. Age of menopause 80-60 Contraceptive History Oral  contraceptives. Gravida 2 Irregular periods Maternal age 28-25 Para 2  Other Problems Kelly Dixon. Kyleena Scheirer, MD; 09/07/2019 5:24 PM) Arthritis Cholelithiasis Gastroesophageal Reflux Disease    Vitals Mammie Lorenzo LPN; [birth year] 2:13 PM) 09/07/2019 4:37 PM Weight: 198.6 lb Height: 64in Body Surface Area: 1.95 m Body Mass Index: 34.09 kg/m  Temp.: 98.36F(Thermal Scan)  Pulse: 106 (Regular)  BP: 138/82 (Sitting, Left Arm, Standard)        Physical Exam Rodman Key K. Hiram Mciver MD; 09/07/2019 5:25 PM)  The physical exam findings are as follows: Note:WDWN in NAD Eyes: Pupils equal, round; sclera anicteric HENT: Oral mucosa moist; good dentition Neck: No masses palpated, no thyromegaly Lungs: CTA bilaterally; normal respiratory effort Breasts: Left breast shows a mildly retracted nipple which the patient states is chronic. No dominant masses. No axillary lymphadenopathy. Right breast shows some bruising in the upper outer quadrant with a palpable hematoma from the biopsy. No axillary lymphadenopathy. No other palpable masses. CV: Regular rate and rhythm; no murmurs; extremities well-perfused with no edema Abd: +bowel sounds, soft, non-tender, no palpable organomegaly; healed laparoscopic incisions Skin: Warm, dry; no sign of jaundice Psychiatric - alert and oriented x 4; calm mood and affect    Assessment & Plan Rodman Key K. Marna Weniger MD; 09/07/2019 5:10 PM)  INVASIVE DUCTAL CARCINOMA OF RIGHT BREAST IN FEMALE (C50.911)  Current Plans Schedule for Surgery - Right radioactive seed localized lumpectomy with sentinel lymph node biopsy. The surgical procedure has been discussed with the patient. Potential risks, benefits, alternative treatments, and expected outcomes have been explained. All of the patient's questions at this time have been answered. The likelihood of reaching the patient's treatment goal is good. The patient understand the proposed surgical procedure  and wishes to proceed.  Kelly Dixon. Georgette Dover, MD, [facility]  General/ Trauma Surgery   09/07/2019 5:25 PM

## 2019-09-08 ENCOUNTER — Other Ambulatory Visit: Payer: Self-pay | Admitting: Surgery

## 2019-09-08 ENCOUNTER — Ambulatory Visit: Payer: Self-pay | Admitting: Surgery

## 2019-09-08 ENCOUNTER — Encounter: Payer: Self-pay | Admitting: Adult Health

## 2019-09-08 DIAGNOSIS — C50911 Malignant neoplasm of unspecified site of right female breast: Secondary | ICD-10-CM

## 2019-09-08 DIAGNOSIS — Z170 Estrogen receptor positive status [ER+]: Secondary | ICD-10-CM | POA: Insufficient documentation

## 2019-09-13 ENCOUNTER — Encounter (HOSPITAL_BASED_OUTPATIENT_CLINIC_OR_DEPARTMENT_OTHER): Payer: Self-pay | Admitting: Surgery

## 2019-09-13 ENCOUNTER — Other Ambulatory Visit (HOSPITAL_COMMUNITY)
Admission: RE | Admit: 2019-09-13 | Discharge: 2019-09-13 | Disposition: A | Discharge status: Home / Self Care | Payer: Medicare HMO | Source: Ambulatory Visit | Attending: Surgery | Admitting: Surgery

## 2019-09-13 ENCOUNTER — Other Ambulatory Visit: Payer: Self-pay

## 2019-09-13 ENCOUNTER — Telehealth: Payer: Self-pay | Admitting: Hematology and Oncology

## 2019-09-13 ENCOUNTER — Ambulatory Visit: Payer: Self-pay | Admitting: Surgery

## 2019-09-13 DIAGNOSIS — Z01812 Encounter for preprocedural laboratory examination: Secondary | ICD-10-CM | POA: Insufficient documentation

## 2019-09-13 DIAGNOSIS — Z20828 Contact with and (suspected) exposure to other viral communicable diseases: Secondary | ICD-10-CM | POA: Diagnosis not present

## 2019-09-13 LAB — SARS CORONAVIRUS 2 (TAT 6-24 HRS): SARS Coronavirus 2: NEGATIVE

## 2019-09-13 NOTE — Telephone Encounter (Signed)
A new patient appt has been scheduled for Kelly Dixon to see Dr. Lindi Adie on 12/30 at 345pm. Pt aware to arrive 15 minutes early.

## 2019-09-13 NOTE — H&P (View-Only) (Signed)
History of Present Illness (Leisel Pinette K. Branden Vine MD; 09/13/2019 8:43 AM) The patient is a 73 year old female who presents with breast cancer. PCP - [place]  This is a 73-year-old female who is status post laparoscopic cholecystectomy in July 2020 who recently underwent routine annual screening mammogram. She was found to have some asymmetry in the right breast. She underwent diagnostic mammogram and ultrasound that showed a suspicious 7 x 8 x 5 mm mass in the right upper outer quadrant at 10:00 5 cm from the nipple. She underwent biopsy of this area that revealed invasive ductal carcinoma grade 2 and DCIS. ER/PR positive. HER-2 negative. Ki-67 15% she had 2 additional areas in the right breast that showed up on ultrasound. These were both biopsied and revealed fibrocystic changes and usual ductal hyperplasia. These were felt to be concordant.  Menarche age 15 First pregnancy age 23 Breast-feed no Hormones- oral contraceptives for 3 years and hormone replacement for 6 months after menopause Menopause age 53 Family history negative for breast cancer  CLINICAL DATA: Screening. EXAM: DIGITAL SCREENING BILATERAL MAMMOGRAM WITH TOMO AND CAD COMPARISON: Previous exam(s). ACR Breast Density Category c: The breast tissue is heterogeneously dense, which may obscure small masses. FINDINGS: In the right breast, a possible asymmetry warrants further evaluation. In the left breast, no findings suspicious for malignancy. Images were processed with CAD. IMPRESSION: Further evaluation is suggested for possible asymmetry in the right breast. RECOMMENDATION: Diagnostic mammogram and possibly ultrasound of the right breast. (Code:[id number]) The patient will be contacted regarding the findings, and additional imaging will be scheduled. BI-RADS CATEGORY 0: Incomplete. Need additional imaging evaluation and/or prior mammograms for comparison. Electronically Signed By: Nancy Ballantyne M.D.  On: 07/28/2019 13:47   CLINICAL DATA: Patient returns after screening study for evaluation of possible RIGHT breast asymmetry. EXAM: DIGITAL DIAGNOSTIC RIGHT MAMMOGRAM WITH CAD AND TOMO ULTRASOUND RIGHT BREAST COMPARISON: 07/28/2019, 07/27/2018 ACR Breast Density Category b: There are scattered areas of fibroglandular density. FINDINGS: Additional 2-D and 3-D images are performed. These views confirm presence of an indistinct mass in the UPPER-OUTER QUADRANT of the RIGHT breast and further evaluated with ultrasound. Mammographic images were processed with CAD. On physical exam, I palpate no abnormality in the UPPER-OUTER QUADRANT of the RIGHT breast. Targeted ultrasound is performed, showing an irregular hypoechoic mass with angular margins in the 10 o'clock location of the RIGHT breast 5 centimeters from nipple measuring 0.7 x 0.8 x 0.5 centimeters. No posterior acoustic features. There is internal blood flow on Doppler evaluation. Evaluation of the RIGHT axilla is negative for adenopathy. IMPRESSION: Suspicious mass the 10 o'clock location of the RIGHT breast for which biopsy is recommended. RECOMMENDATION: Ultrasound-guided core biopsy RIGHT breast. Biopsy has been scheduled at the request of the patient for 08/16/2019, to give her time to followup with her insurance company. I have discussed the findings and recommendations with the patient. If applicable, a reminder letter will be sent to the patient regarding the next appointment. BI-RADS CATEGORY 4: Suspicious. Electronically Signed By: Elizabeth Brown M.D. On: 08/09/2019 15:08  Result History  MM DIAG BREAST TOMO UNI RIGHT (Order #[id number]) on 08/09/2019 - Order Result History Report <epic:EPIC?REPORT&MR_REPORTS&11^R99#[id number],ORD,[id number]>  US BREAST LTD UNI RIGHT INC AXILLA (Order [id number]) - Reflex for Order [id number] Study Result  CLINICAL DATA: Patient returns after screening study for  evaluation of possible RIGHT breast asymmetry. EXAM: DIGITAL DIAGNOSTIC RIGHT MAMMOGRAM WITH CAD AND TOMO ULTRASOUND RIGHT BREAST COMPARISON: 07/28/2019, 07/27/2018 ACR Breast Density Category b: There are scattered areas of fibroglandular   density. FINDINGS: Additional 2-D and 3-D images are performed. These views confirm presence of an indistinct mass in the UPPER-OUTER QUADRANT of the RIGHT breast and further evaluated with ultrasound. Mammographic images were processed with CAD. On physical exam, I palpate no abnormality in the UPPER-OUTER QUADRANT of the RIGHT breast. Targeted ultrasound is performed, showing an irregular hypoechoic mass with angular margins in the 10 o'clock location of the RIGHT breast 5 centimeters from nipple measuring 0.7 x 0.8 x 0.5 centimeters. No posterior acoustic features. There is internal blood flow on Doppler evaluation. Evaluation of the RIGHT axilla is negative for adenopathy. IMPRESSION: Suspicious mass the 10 o'clock location of the RIGHT breast for which biopsy is recommended. RECOMMENDATION: Ultrasound-guided core biopsy RIGHT breast. Biopsy has been scheduled at the request of the patient for 08/16/2019, to give her time to followup with her insurance company. I have discussed the findings and recommendations with the patient. If applicable, a reminder letter will be sent to the patient regarding the next appointment. BI-RADS CATEGORY 4: Suspicious. Electronically Signed By: Elizabeth Brown M.D. On: 08/09/2019 15:08  CLINICAL DATA: 73-year-old female presenting for ultrasound-guided biopsy of a right breast mass.  EXAM: ULTRASOUND GUIDED RIGHT BREAST CORE NEEDLE BIOPSY  COMPARISON: Previous exam(s).  FINDINGS: I met with the patient and we discussed the procedure of ultrasound-guided biopsy, including benefits and alternatives. We discussed the high likelihood of a successful procedure. We discussed the risks of the  procedure, including infection, bleeding, tissue injury, clip migration, and inadequate sampling. Informed written consent was given. The usual time-out protocol was performed immediately prior to the procedure.  While scanning in preparation for the biopsy, 2 masses were identified, in addition to the mass recommended for biopsy at 10 o'clock, 5 cm from the nipple.  One of the additional masses is at 10 o'clock, 6 cm from the nipple and measures 1.0 x 0.4 x 0.9 cm. Together with the mass recommended for biopsy, this occupies 2.2 cm span.  The second additional mass is seen at 11 o'clock, 4 cm from the nipple and measures 1.2 x 0.4 x 0.9 cm. Together with the mass recommended for biopsy, this spans 3.8 cm.  Lesion quadrant: Upper-outer quadrant  Using sterile technique and 1% Lidocaine as local anesthetic, under direct ultrasound visualization, a 14 gauge spring-loaded device was used to perform biopsy of a mass in the right breast at 10 o'clock, 5 cm from the nipple using an inferior approach. At the conclusion of the procedure ribbon shaped tissue marker clip was deployed into the biopsy cavity. Follow up 2 view mammogram was performed and dictated separately.  IMPRESSION: 1. Ultrasound guided biopsy of a right breast mass at 10 o'clock. No apparent complications.  2. In preparation for the biopsy, 2 additional masses were seen in the upper inner quadrant of the right breast, one at 10 o'clock and one at 11 o'clock. If the above biopsy is benign, a six-month follow-up will be recommended for these 2 additional masses. If surgery is necessary based on pathology results, ultrasound-guided biopsy should be performed for these 2 additional masses.  Electronically Signed: By: Michelle Collins M.D. On: 08/31/2019 13:41  ADDENDUM: Pathology revealed GRADE II INVASIVE DUCTAL CARCINOMA, DUCTAL CARCINOMA IN SITU of the RIGHT breast, 10 o'clock. This was found to be concordant by  Dr. Michelle Collins.  Pathology results were discussed with the patient by telephone. The patient reported doing well after the biopsy with tenderness at the site. Post biopsy instructions and care were reviewed and   questions were answered. The patient was encouraged to call [facility] for any additional concerns.  Per patient request, surgical consultation has been arranged with Dr. Lafonda Patron Arelene Moroni at [facility] on September 13, 2019. Ultrasound-guided biopsy for the 2 additional masses in RIGHT breast is scheduled for September 06, 2019 at [facility].  Pathology results reported by Susan Eaton, RN on 09/01/2019.   Electronically Signed By: Michelle Collins M.D. On: 09/01/2019 14:21   Problem List/Past Medical (Laveta Gilkey K. Lani Havlik, MD; 09/07/2019 5:24 PM) CHRONIC CHOLECYSTITIS WITH CALCULUS (K80.10) INVASIVE DUCTAL CARCINOMA OF RIGHT BREAST IN FEMALE (C50.911)  Past Surgical History (Florine Sprenkle K. Fantashia Shupert, MD; 09/07/2019 5:24 PM) No pertinent past surgical history Oral Surgery  Diagnostic Studies History (Fumiko Cham K. Vashon Arch, MD; 09/07/2019 5:24 PM) Colonoscopy never Mammogram within last year Pap Smear >5 years ago  Allergies (Lesette Frary K. Zaiyah Sottile, MD; 09/07/2019 5:24 PM) No Known Drug Allergies [03/17/2019]:  Medication History (Kelly Dockery, LPN; 09/07/2019 4:37 PM) Meloxicam (7.5MG Tablet, Oral) Active. Loratadine (10MG Tablet, Oral) Active. Medications Reconciled  Social History (Zari Cly K. Shameeka Silliman, MD; 09/07/2019 5:24 PM) Alcohol use Occasional alcohol use. Caffeine use Carbonated beverages. No caffeine use No drug use Tobacco use Current some day smoker.  Family History (Camay Pedigo K. Stori Royse, MD; 09/07/2019 5:24 PM) Heart Disease Father.  Pregnancy / Birth History (Jedi Catalfamo K. Krystel Fletchall, MD; 09/07/2019 5:24 PM) Age at menarche 15 years. Age of menopause [age] Contraceptive History Oral  contraceptives. Gravida 2 Irregular periods Maternal age 21-25 Para 2  Other Problems (Giuseppe Duchemin K. Naela Nodal, MD; 09/07/2019 5:24 PM) Arthritis Cholelithiasis Gastroesophageal Reflux Disease    Vitals (Kelly Dockery LPN; 09/07/2019 4:37 PM) 09/07/2019 4:37 PM Weight: 198.6 lb Height: 64in Body Surface Area: 1.95 m Body Mass Index: 34.09 kg/m  Temp.: 98.4F(Thermal Scan)  Pulse: 106 (Regular)  BP: 138/82 (Sitting, Left Arm, Standard)        Physical Exam (Lathyn Griggs K. Aarna Mihalko MD; 09/07/2019 5:25 PM)  The physical exam findings are as follows: Note:WDWN in NAD Eyes: Pupils equal, round; sclera anicteric HENT: Oral mucosa moist; good dentition Neck: No masses palpated, no thyromegaly Lungs: CTA bilaterally; normal respiratory effort Breasts: Left breast shows a mildly retracted nipple which the patient states is chronic. No dominant masses. No axillary lymphadenopathy. Right breast shows some bruising in the upper outer quadrant with a palpable hematoma from the biopsy. No axillary lymphadenopathy. No other palpable masses. CV: Regular rate and rhythm; no murmurs; extremities well-perfused with no edema Abd: +bowel sounds, soft, non-tender, no palpable organomegaly; healed laparoscopic incisions Skin: Warm, dry; no sign of jaundice Psychiatric - alert and oriented x 4; calm mood and affect    Assessment & Plan (Early Ord K. Kerline Trahan MD; 09/07/2019 5:10 PM)  INVASIVE DUCTAL CARCINOMA OF RIGHT BREAST IN FEMALE (C50.911)  Current Plans Schedule for Surgery - Right radioactive seed localized lumpectomy with sentinel lymph node biopsy. The surgical procedure has been discussed with the patient. Potential risks, benefits, alternative treatments, and expected outcomes have been explained. All of the patient's questions at this time have been answered. The likelihood of reaching the patient's treatment goal is good. The patient understand the proposed surgical procedure  and wishes to proceed.  Tamaira Ciriello K. Deaysia Grigoryan, MD, FACS [facility]  General/ Trauma Surgery   09/13/2019 8:44 AM  

## 2019-09-13 NOTE — H&P (Signed)
History of Present Illness Kelly Dixon. Christophere Hillhouse MD; 09/13/2019 8:43 AM) The patient is a 73 year old female who presents with breast cancer. PCP - Kelly Dixon  This is a 73 year old female who is status post laparoscopic cholecystectomy in July 2020 who recently underwent routine annual screening mammogram. She was found to have some asymmetry in the right breast. She underwent diagnostic mammogram and ultrasound that showed a suspicious 7 x 8 x 5 mm mass in the right upper outer quadrant at 10:00 5 cm from the nipple. She underwent biopsy of this area that revealed invasive ductal carcinoma grade 2 and DCIS. ER/PR positive. HER-2 negative. Ki-67 15% she had 2 additional areas in the right breast that showed up on ultrasound. These were both biopsied and revealed fibrocystic changes and usual ductal hyperplasia. These were felt to be concordant.  Menarche age 26 First pregnancy age 50 Breast-feed no Hormones- oral contraceptives for 3 years and hormone replacement for 6 months after menopause Menopause age 66 Family history negative for breast cancer  CLINICAL DATA: Screening. EXAM: DIGITAL SCREENING BILATERAL MAMMOGRAM WITH TOMO AND CAD COMPARISON: Previous exam(s). ACR Breast Density Category c: The breast tissue is heterogeneously dense, which may obscure small masses. FINDINGS: In the right breast, a possible asymmetry warrants further evaluation. In the left breast, no findings suspicious for malignancy. Images were processed with CAD. IMPRESSION: Further evaluation is suggested for possible asymmetry in the right breast. RECOMMENDATION: Diagnostic mammogram and possibly ultrasound of the right breast. (Code:[id number]) The patient will be contacted regarding the findings, and additional imaging will be scheduled. BI-RADS CATEGORY 0: Incomplete. Need additional imaging evaluation and/or prior mammograms for comparison. Electronically Signed By: [name] M.D.  On: 07/28/2019 13:47   CLINICAL DATA: Patient returns after screening study for evaluation of possible RIGHT breast asymmetry. EXAM: DIGITAL DIAGNOSTIC RIGHT MAMMOGRAM WITH CAD AND TOMO ULTRASOUND RIGHT BREAST COMPARISON: 07/28/2019, 07/27/2018 ACR Breast Density Category b: There are scattered areas of fibroglandular density. FINDINGS: Additional 2-D and 3-D images are performed. These views confirm presence of an indistinct mass in the UPPER-OUTER QUADRANT of the RIGHT breast and further evaluated with ultrasound. Mammographic images were processed with CAD. On physical exam, I palpate no abnormality in the UPPER-OUTER QUADRANT of the RIGHT breast. Targeted ultrasound is performed, showing an irregular hypoechoic mass with angular margins in the 10 o'clock location of the RIGHT breast 5 centimeters from nipple measuring 0.7 x 0.8 x 0.5 centimeters. No posterior acoustic features. There is internal blood flow on Doppler evaluation. Evaluation of the RIGHT axilla is negative for adenopathy. IMPRESSION: Suspicious mass the 10 o'clock location of the RIGHT breast for which biopsy is recommended. RECOMMENDATION: Ultrasound-guided core biopsy RIGHT breast. Biopsy has been scheduled at the request of the patient for 08/16/2019, to give her time to followup with her insurance company. I have discussed the findings and recommendations with the patient. If applicable, a reminder letter will be sent to the patient regarding the next appointment. BI-RADS CATEGORY 4: Suspicious. Electronically Signed By: [name] M.D. On: 08/09/2019 15:08  Result History  MM DIAG BREAST TOMO UNI RIGHT (Order #[id number]) on 08/09/2019 - Order Result History Report <epic:EPIC?REPORT&MR_REPORTS&11^R99#[id number],ORD,[id number]>  US BREAST LTD UNI RIGHT INC AXILLA (Order [id number]) - Reflex for Order [id number] Study Result  CLINICAL DATA: Patient returns after screening study for  evaluation of possible RIGHT breast asymmetry. EXAM: DIGITAL DIAGNOSTIC RIGHT MAMMOGRAM WITH CAD AND TOMO ULTRASOUND RIGHT BREAST COMPARISON: 07/28/2019, 07/27/2018 ACR Breast Density Category b: There are scattered areas of fibroglandular  density. FINDINGS: Additional 2-D and 3-D images are performed. These views confirm presence of an indistinct mass in the UPPER-OUTER QUADRANT of the RIGHT breast and further evaluated with ultrasound. Mammographic images were processed with CAD. On physical exam, I palpate no abnormality in the UPPER-OUTER QUADRANT of the RIGHT breast. Targeted ultrasound is performed, showing an irregular hypoechoic mass with angular margins in the 10 o'clock location of the RIGHT breast 5 centimeters from nipple measuring 0.7 x 0.8 x 0.5 centimeters. No posterior acoustic features. There is internal blood flow on Doppler evaluation. Evaluation of the RIGHT axilla is negative for adenopathy. IMPRESSION: Suspicious mass the 10 o'clock location of the RIGHT breast for which biopsy is recommended. RECOMMENDATION: Ultrasound-guided core biopsy RIGHT breast. Biopsy has been scheduled at the request of the patient for 08/16/2019, to give her time to followup with her insurance company. I have discussed the findings and recommendations with the patient. If applicable, a reminder letter will be sent to the patient regarding the next appointment. BI-RADS CATEGORY 4: Suspicious. Electronically Signed By: [name] M.D. On: 08/09/2019 15:08  CLINICAL DATA: 73 year old female presenting for ultrasound-guided biopsy of a right breast mass.  EXAM: ULTRASOUND GUIDED RIGHT BREAST CORE NEEDLE BIOPSY  COMPARISON: Previous exam(s).  FINDINGS: I met with the patient and we discussed the procedure of ultrasound-guided biopsy, including benefits and alternatives. We discussed the high likelihood of a successful procedure. We discussed the risks of the  procedure, including infection, bleeding, tissue injury, clip migration, and inadequate sampling. Informed written consent was given. The usual time-out protocol was performed immediately prior to the procedure.  While scanning in preparation for the biopsy, 2 masses were identified, in addition to the mass recommended for biopsy at 10 o'clock, 5 cm from the nipple.  One of the additional masses is at 10 o'clock, 6 cm from the nipple and measures 1.0 x 0.4 x 0.9 cm. Together with the mass recommended for biopsy, this occupies 2.2 cm span.  The second additional mass is seen at 11 o'clock, 4 cm from the nipple and measures 1.2 x 0.4 x 0.9 cm. Together with the mass recommended for biopsy, this spans 3.8 cm.  Lesion quadrant: Upper-outer quadrant  Using sterile technique and 1% Lidocaine as local anesthetic, under direct ultrasound visualization, a 14 gauge spring-loaded device was used to perform biopsy of a mass in the right breast at 10 o'clock, 5 cm from the nipple using an inferior approach. At the conclusion of the procedure ribbon shaped tissue marker clip was deployed into the biopsy cavity. Follow up 2 view mammogram was performed and dictated separately.  IMPRESSION: 1. Ultrasound guided biopsy of a right breast mass at 10 o'clock. No apparent complications.  2. In preparation for the biopsy, 2 additional masses were seen in the upper inner quadrant of the right breast, one at 10 o'clock and one at 11 o'clock. If the above biopsy is benign, a six-month follow-up will be recommended for these 2 additional masses. If surgery is necessary based on pathology results, ultrasound-guided biopsy should be performed for these 2 additional masses.  Electronically Signed: By: Ammie Ferrier M.D. On: 08/31/2019 13:41  ADDENDUM: Pathology revealed GRADE II INVASIVE DUCTAL CARCINOMA, DUCTAL CARCINOMA IN SITU of the RIGHT breast, 10 o'clock. This was found to be concordant by  Dr. Ammie Ferrier.  Pathology results were discussed with the patient by telephone. The patient reported doing well after the biopsy with tenderness at the site. Post biopsy instructions and care were reviewed and  questions were answered. The patient was encouraged to call [facility] for any additional concerns.  Per patient request, surgical consultation has been arranged with Dr. Donnie Mesa at [facility] on September 13, 2019. Ultrasound-guided biopsy for the 2 additional masses in RIGHT breast is scheduled for September 06, 2019 at [place].  Pathology results reported by Stacie Acres, RN on 09/01/2019.   Electronically Signed By: Ammie Ferrier M.D. On: 09/01/2019 14:21   Problem List/Past Medical Rodman Key K. Ceirra Belli, MD; 09/07/2019 5:24 PM) CHRONIC CHOLECYSTITIS WITH CALCULUS (K80.10) INVASIVE DUCTAL CARCINOMA OF RIGHT BREAST IN FEMALE (C50.911)  Past Surgical History (Kameran Mcneese K. Maley Venezia, MD; 09/07/2019 5:24 PM) No pertinent past surgical history Oral Surgery  Diagnostic Studies History (Jozelynn Danielson K. Dearra Myhand, MD; 09/07/2019 5:24 PM) Colonoscopy never Mammogram within last year Pap Smear >5 years ago  Allergies Rodman Key K. Wyndi Northrup, MD; 09/07/2019 5:24 PM) No Known Drug Allergies [03/17/2019]:  Medication History Mammie Lorenzo, LPN; 19/03/5882 2:54 PM) Meloxicam (7.5MG Tablet, Oral) Active. Loratadine (10MG Tablet, Oral) Active. Medications Reconciled  Social History Kelly Dixon. Laquinton Bihm, MD; 09/07/2019 5:24 PM) Alcohol use Occasional alcohol use. Caffeine use Carbonated beverages. No caffeine use No drug use Tobacco use Current some day smoker.  Family History Kelly Dixon. Anica Alcaraz, MD; 09/07/2019 5:24 PM) Heart Disease Father.  Pregnancy / Birth History Kelly Dixon. Zurri Rudden, MD; 09/07/2019 5:24 PM) Age at menarche 43 years. Age of menopause [age] Contraceptive History Oral  contraceptives. Gravida 2 Irregular periods Maternal age 39-25 Para 2  Other Problems Kelly Dixon. Vivian Neuwirth, MD; 09/07/2019 5:24 PM) Arthritis Cholelithiasis Gastroesophageal Reflux Disease    Vitals Mammie Lorenzo LPN; 98/10/6413 8:30 PM) 09/07/2019 4:37 PM Weight: 198.6 lb Height: 64in Body Surface Area: 1.95 m Body Mass Index: 34.09 kg/m  Temp.: 98.23F(Thermal Scan)  Pulse: 106 (Regular)  BP: 138/82 (Sitting, Left Arm, Standard)        Physical Exam Rodman Key K. Ranessa Kosta MD; 09/07/2019 5:25 PM)  The physical exam findings are as follows: Note:WDWN in NAD Eyes: Pupils equal, round; sclera anicteric HENT: Oral mucosa moist; good dentition Neck: No masses palpated, no thyromegaly Lungs: CTA bilaterally; normal respiratory effort Breasts: Left breast shows a mildly retracted nipple which the patient states is chronic. No dominant masses. No axillary lymphadenopathy. Right breast shows some bruising in the upper outer quadrant with a palpable hematoma from the biopsy. No axillary lymphadenopathy. No other palpable masses. CV: Regular rate and rhythm; no murmurs; extremities well-perfused with no edema Abd: +bowel sounds, soft, non-tender, no palpable organomegaly; healed laparoscopic incisions Skin: Warm, dry; no sign of jaundice Psychiatric - alert and oriented x 4; calm mood and affect    Assessment & Plan Rodman Key K. Indie Nickerson MD; 09/07/2019 5:10 PM)  INVASIVE DUCTAL CARCINOMA OF RIGHT BREAST IN FEMALE (C50.911)  Current Plans Schedule for Surgery - Right radioactive seed localized lumpectomy with sentinel lymph node biopsy. The surgical procedure has been discussed with the patient. Potential risks, benefits, alternative treatments, and expected outcomes have been explained. All of the patient's questions at this time have been answered. The likelihood of reaching the patient's treatment goal is good. The patient understand the proposed surgical procedure  and wishes to proceed.  Kelly Dixon. Georgette Dover, MD, [facility]  General/ Trauma Surgery   09/13/2019 8:44 AM

## 2019-09-14 ENCOUNTER — Other Ambulatory Visit (HOSPITAL_COMMUNITY): Payer: Medicare HMO

## 2019-09-14 ENCOUNTER — Ambulatory Visit
Admission: RE | Admit: 2019-09-14 | Discharge: 2019-09-14 | Disposition: A | Discharge status: Home / Self Care | Payer: Medicare HMO | Source: Ambulatory Visit | Attending: Surgery | Admitting: Surgery

## 2019-09-14 DIAGNOSIS — C50411 Malignant neoplasm of upper-outer quadrant of right female breast: Secondary | ICD-10-CM | POA: Diagnosis not present

## 2019-09-15 ENCOUNTER — Ambulatory Visit (HOSPITAL_BASED_OUTPATIENT_CLINIC_OR_DEPARTMENT_OTHER)
Admission: RE | Admit: 2019-09-15 | Discharge: 2019-09-15 | Disposition: A | Discharge status: Home / Self Care | Payer: Medicare HMO | Attending: Surgery | Admitting: Surgery

## 2019-09-15 ENCOUNTER — Other Ambulatory Visit: Payer: Self-pay

## 2019-09-15 ENCOUNTER — Ambulatory Visit (HOSPITAL_COMMUNITY)
Admission: RE | Admit: 2019-09-15 | Discharge: 2019-09-15 | Disposition: A | Discharge status: Home / Self Care | Payer: Medicare HMO | Source: Ambulatory Visit | Attending: Surgery | Admitting: Surgery

## 2019-09-15 ENCOUNTER — Ambulatory Visit (HOSPITAL_BASED_OUTPATIENT_CLINIC_OR_DEPARTMENT_OTHER): Payer: Medicare HMO | Admitting: Certified Registered"

## 2019-09-15 ENCOUNTER — Encounter (HOSPITAL_BASED_OUTPATIENT_CLINIC_OR_DEPARTMENT_OTHER): Payer: Self-pay | Admitting: Surgery

## 2019-09-15 ENCOUNTER — Ambulatory Visit
Admission: RE | Admit: 2019-09-15 | Discharge: 2019-09-15 | Disposition: A | Discharge status: Home / Self Care | Payer: Medicare HMO | Source: Ambulatory Visit | Attending: Surgery | Admitting: Surgery

## 2019-09-15 ENCOUNTER — Encounter (HOSPITAL_BASED_OUTPATIENT_CLINIC_OR_DEPARTMENT_OTHER)
Admission: RE | Disposition: A | Discharge status: Home / Self Care | Payer: Self-pay | Source: Home / Self Care | Attending: Surgery

## 2019-09-15 DIAGNOSIS — E669 Obesity, unspecified: Secondary | ICD-10-CM | POA: Insufficient documentation

## 2019-09-15 DIAGNOSIS — Z791 Long term (current) use of non-steroidal anti-inflammatories (NSAID): Secondary | ICD-10-CM | POA: Diagnosis not present

## 2019-09-15 DIAGNOSIS — M1990 Unspecified osteoarthritis, unspecified site: Secondary | ICD-10-CM | POA: Diagnosis not present

## 2019-09-15 DIAGNOSIS — Z79899 Other long term (current) drug therapy: Secondary | ICD-10-CM | POA: Insufficient documentation

## 2019-09-15 DIAGNOSIS — Z6836 Body mass index (BMI) 36.0-36.9, adult: Secondary | ICD-10-CM | POA: Insufficient documentation

## 2019-09-15 DIAGNOSIS — G8918 Other acute postprocedural pain: Secondary | ICD-10-CM | POA: Diagnosis not present

## 2019-09-15 DIAGNOSIS — Z170 Estrogen receptor positive status [ER+]: Secondary | ICD-10-CM | POA: Insufficient documentation

## 2019-09-15 DIAGNOSIS — N6489 Other specified disorders of breast: Secondary | ICD-10-CM | POA: Insufficient documentation

## 2019-09-15 DIAGNOSIS — C50411 Malignant neoplasm of upper-outer quadrant of right female breast: Secondary | ICD-10-CM | POA: Insufficient documentation

## 2019-09-15 DIAGNOSIS — F17200 Nicotine dependence, unspecified, uncomplicated: Secondary | ICD-10-CM | POA: Insufficient documentation

## 2019-09-15 DIAGNOSIS — K219 Gastro-esophageal reflux disease without esophagitis: Secondary | ICD-10-CM | POA: Diagnosis not present

## 2019-09-15 DIAGNOSIS — C50911 Malignant neoplasm of unspecified site of right female breast: Secondary | ICD-10-CM

## 2019-09-15 HISTORY — PX: BREAST LUMPECTOMY WITH RADIOACTIVE SEED AND SENTINEL LYMPH NODE BIOPSY: SHX6550

## 2019-09-15 SURGERY — BREAST LUMPECTOMY WITH RADIOACTIVE SEED AND SENTINEL LYMPH NODE BIOPSY
Anesthesia: General | Site: Breast | Laterality: Right

## 2019-09-15 MED ORDER — SODIUM CHLORIDE (PF) 0.9 % IJ SOLN
INTRAMUSCULAR | Status: AC
  Filled 2019-09-15: qty 10

## 2019-09-15 MED ORDER — PROPOFOL 10 MG/ML IV BOLUS
INTRAVENOUS | Status: DC | PRN
  Administered 2019-09-15: 200 mg via INTRAVENOUS

## 2019-09-15 MED ORDER — CEFAZOLIN SODIUM-DEXTROSE 2-3 GM-%(50ML) IV SOLR
INTRAVENOUS | Status: DC | PRN
  Administered 2019-09-15: 2 g via INTRAVENOUS

## 2019-09-15 MED ORDER — 0.9 % SODIUM CHLORIDE (POUR BTL) OPTIME
TOPICAL | Status: DC | PRN
  Administered 2019-09-15: 14:00:00 1000 mL

## 2019-09-15 MED ORDER — FENTANYL CITRATE (PF) 100 MCG/2ML IJ SOLN
INTRAMUSCULAR | Status: AC
  Filled 2019-09-15: qty 2

## 2019-09-15 MED ORDER — MIDAZOLAM HCL 2 MG/2ML IJ SOLN
INTRAMUSCULAR | Status: AC
  Filled 2019-09-15: qty 2

## 2019-09-15 MED ORDER — CEFAZOLIN SODIUM-DEXTROSE 2-4 GM/100ML-% IV SOLN: 2.0000 g | INTRAVENOUS | Status: DC

## 2019-09-15 MED ORDER — MIDAZOLAM HCL 2 MG/2ML IJ SOLN
1.0000 mg | INTRAMUSCULAR | Status: DC | PRN
  Administered 2019-09-15 (×2): 1 mg via INTRAVENOUS

## 2019-09-15 MED ORDER — TECHNETIUM TC 99M SULFUR COLLOID FILTERED
1.0000 | Freq: Once | INTRAVENOUS | Status: AC | PRN
  Administered 2019-09-15: 1 via INTRADERMAL

## 2019-09-15 MED ORDER — DEXAMETHASONE SODIUM PHOSPHATE 10 MG/ML IJ SOLN
INTRAMUSCULAR | Status: DC | PRN
  Administered 2019-09-15: 5 mg via INTRAVENOUS

## 2019-09-15 MED ORDER — LACTATED RINGERS IV SOLN: INTRAVENOUS | Status: DC | PRN

## 2019-09-15 MED ORDER — LIDOCAINE 2% (20 MG/ML) 5 ML SYRINGE
INTRAMUSCULAR | Status: AC
  Filled 2019-09-15: qty 5

## 2019-09-15 MED ORDER — ACETAMINOPHEN 500 MG PO TABS
ORAL_TABLET | ORAL | Status: AC
  Filled 2019-09-15: qty 2

## 2019-09-15 MED ORDER — GABAPENTIN 300 MG PO CAPS
ORAL_CAPSULE | ORAL | Status: AC
  Filled 2019-09-15: qty 1

## 2019-09-15 MED ORDER — GABAPENTIN 300 MG PO CAPS
300.0000 mg | ORAL_CAPSULE | ORAL | Status: AC
  Administered 2019-09-15: 300 mg via ORAL

## 2019-09-15 MED ORDER — SODIUM CHLORIDE (PF) 0.9 % IJ SOLN
INTRAVENOUS | Status: DC | PRN
  Administered 2019-09-15: 5 mL

## 2019-09-15 MED ORDER — BUPIVACAINE-EPINEPHRINE 0.25% -1:200000 IJ SOLN
INTRAMUSCULAR | Status: DC | PRN
  Administered 2019-09-15: 20 mL

## 2019-09-15 MED ORDER — BUPIVACAINE HCL (PF) 0.25 % IJ SOLN
INTRAMUSCULAR | Status: AC
  Filled 2019-09-15: qty 30

## 2019-09-15 MED ORDER — DEXAMETHASONE SODIUM PHOSPHATE 10 MG/ML IJ SOLN
INTRAMUSCULAR | Status: AC
  Filled 2019-09-15: qty 1

## 2019-09-15 MED ORDER — PROPOFOL 10 MG/ML IV BOLUS
INTRAVENOUS | Status: AC
  Filled 2019-09-15: qty 40

## 2019-09-15 MED ORDER — SODIUM CHLORIDE 0.9 % IV SOLN
INTRAVENOUS | Status: DC | PRN
  Administered 2019-09-15: 14:00:00 120 ug via INTRAVENOUS

## 2019-09-15 MED ORDER — METHYLENE BLUE 0.5 % INJ SOLN
INTRAVENOUS | Status: AC
  Filled 2019-09-15: qty 10

## 2019-09-15 MED ORDER — ONDANSETRON HCL 4 MG/2ML IJ SOLN
INTRAMUSCULAR | Status: DC | PRN
  Administered 2019-09-15: 4 mg via INTRAVENOUS

## 2019-09-15 MED ORDER — CHLORHEXIDINE GLUCONATE CLOTH 2 % EX PADS: 6.0000 | MEDICATED_PAD | Freq: Once | CUTANEOUS | Status: DC

## 2019-09-15 MED ORDER — ONDANSETRON HCL 4 MG/2ML IJ SOLN
INTRAMUSCULAR | Status: AC
  Filled 2019-09-15: qty 2

## 2019-09-15 MED ORDER — LACTATED RINGERS IV SOLN: INTRAVENOUS | Status: DC

## 2019-09-15 MED ORDER — ROPIVACAINE HCL 5 MG/ML IJ SOLN
INTRAMUSCULAR | Status: DC | PRN
  Administered 2019-09-15: 30 mL via PERINEURAL

## 2019-09-15 MED ORDER — CEFAZOLIN SODIUM-DEXTROSE 2-4 GM/100ML-% IV SOLN
INTRAVENOUS | Status: AC
  Filled 2019-09-15: qty 100

## 2019-09-15 MED ORDER — FENTANYL CITRATE (PF) 100 MCG/2ML IJ SOLN
50.0000 ug | INTRAMUSCULAR | Status: DC | PRN
  Administered 2019-09-15 (×2): 50 ug via INTRAVENOUS

## 2019-09-15 MED ORDER — ACETAMINOPHEN 500 MG PO TABS
1000.0000 mg | ORAL_TABLET | ORAL | Status: AC
  Administered 2019-09-15: 1000 mg via ORAL

## 2019-09-15 MED ORDER — FENTANYL CITRATE (PF) 100 MCG/2ML IJ SOLN
INTRAMUSCULAR | Status: DC | PRN
  Administered 2019-09-15: 100 ug via INTRAVENOUS

## 2019-09-15 SURGICAL SUPPLY — 62 items
APPLIER CLIP 9.375 MED OPEN (MISCELLANEOUS) ×3
BENZOIN TINCTURE PRP APPL 2/3 (GAUZE/BANDAGES/DRESSINGS) ×3 IMPLANT
BINDER BREAST 3XL (GAUZE/BANDAGES/DRESSINGS) IMPLANT
BINDER BREAST LRG (GAUZE/BANDAGES/DRESSINGS) IMPLANT
BINDER BREAST MEDIUM (GAUZE/BANDAGES/DRESSINGS) IMPLANT
BINDER BREAST XLRG (GAUZE/BANDAGES/DRESSINGS) IMPLANT
BINDER BREAST XXLRG (GAUZE/BANDAGES/DRESSINGS) IMPLANT
BLADE CLIPPER SURG (BLADE) ×2 IMPLANT
BLADE HEX COATED 2.75 (ELECTRODE) ×3 IMPLANT
BLADE SURG 10 STRL SS (BLADE) ×2 IMPLANT
BLADE SURG 15 STRL LF DISP TIS (BLADE) ×1 IMPLANT
BLADE SURG 15 STRL SS (BLADE) ×2
CANISTER SUC SOCK COL 7IN (MISCELLANEOUS) ×3 IMPLANT
CANISTER SUCT 1200ML W/VALVE (MISCELLANEOUS) ×2 IMPLANT
CHLORAPREP W/TINT 26 (MISCELLANEOUS) ×3 IMPLANT
CLIP APPLIE 9.375 MED OPEN (MISCELLANEOUS) IMPLANT
CLOSURE STERI-STRIP 1/2X4 (GAUZE/BANDAGES/DRESSINGS) ×1
CLOSURE WOUND 1/2 X4 (GAUZE/BANDAGES/DRESSINGS) ×1
CLSR STERI-STRIP ANTIMIC 1/2X4 (GAUZE/BANDAGES/DRESSINGS) ×1 IMPLANT
COVER BACK TABLE REUSABLE LG (DRAPES) ×3 IMPLANT
COVER MAYO STAND REUSABLE (DRAPES) ×3 IMPLANT
COVER PROBE W GEL 5X96 (DRAPES) ×3 IMPLANT
COVER WAND RF STERILE (DRAPES) IMPLANT
DECANTER SPIKE VIAL GLASS SM (MISCELLANEOUS) IMPLANT
DRAPE LAPAROSCOPIC ABDOMINAL (DRAPES) ×3 IMPLANT
DRAPE UTILITY XL STRL (DRAPES) ×3 IMPLANT
DRSG TEGADERM 4X4.75 (GAUZE/BANDAGES/DRESSINGS) ×4 IMPLANT
ELECT REM PT RETURN 9FT ADLT (ELECTROSURGICAL) ×3
ELECTRODE REM PT RTRN 9FT ADLT (ELECTROSURGICAL) ×1 IMPLANT
GAUZE SPONGE 4X4 12PLY STRL LF (GAUZE/BANDAGES/DRESSINGS) IMPLANT
GLOVE BIO SURGEON STRL SZ7 (GLOVE) ×3 IMPLANT
GLOVE BIOGEL PI IND STRL 7.5 (GLOVE) ×1 IMPLANT
GLOVE BIOGEL PI INDICATOR 7.5 (GLOVE) ×2
GOWN STRL REUS W/ TWL LRG LVL3 (GOWN DISPOSABLE) ×2 IMPLANT
GOWN STRL REUS W/TWL LRG LVL3 (GOWN DISPOSABLE) ×4
ILLUMINATOR WAVEGUIDE N/F (MISCELLANEOUS) IMPLANT
KIT MARKER MARGIN INK (KITS) ×3 IMPLANT
LIGHT WAVEGUIDE WIDE FLAT (MISCELLANEOUS) IMPLANT
NDL HYPO 25X1 1.5 SAFETY (NEEDLE) ×2 IMPLANT
NDL SAFETY ECLIPSE 18X1.5 (NEEDLE) ×1 IMPLANT
NEEDLE HYPO 18GX1.5 SHARP (NEEDLE) ×2
NEEDLE HYPO 25X1 1.5 SAFETY (NEEDLE) ×6 IMPLANT
NS IRRIG 1000ML POUR BTL (IV SOLUTION) ×2 IMPLANT
PACK BASIN DAY SURGERY FS (CUSTOM PROCEDURE TRAY) ×3 IMPLANT
PENCIL SMOKE EVACUATOR (MISCELLANEOUS) ×3 IMPLANT
SLEEVE SCD COMPRESS KNEE MED (MISCELLANEOUS) ×3 IMPLANT
SPONGE GAUZE 2X2 8PLY STER LF (GAUZE/BANDAGES/DRESSINGS) ×1
SPONGE GAUZE 2X2 8PLY STRL LF (GAUZE/BANDAGES/DRESSINGS) ×1 IMPLANT
SPONGE LAP 18X18 RF (DISPOSABLE) ×2 IMPLANT
SPONGE LAP 4X18 RFD (DISPOSABLE) ×3 IMPLANT
STRIP CLOSURE SKIN 1/2X4 (GAUZE/BANDAGES/DRESSINGS) ×2 IMPLANT
SUT MON AB 4-0 PC3 18 (SUTURE) ×5 IMPLANT
SUT SILK 2 0 SH (SUTURE) IMPLANT
SUT VIC AB 3-0 SH 27 (SUTURE) ×2
SUT VIC AB 3-0 SH 27X BRD (SUTURE) ×1 IMPLANT
SYR BULB 3OZ (MISCELLANEOUS) ×3 IMPLANT
SYR CONTROL 10ML LL (SYRINGE) ×6 IMPLANT
TOWEL GREEN STERILE FF (TOWEL DISPOSABLE) ×3 IMPLANT
TRAY FAXITRON CT DISP (TRAY / TRAY PROCEDURE) ×3 IMPLANT
TUBE CONNECTING 20'X1/4 (TUBING) ×1
TUBE CONNECTING 20X1/4 (TUBING) ×2 IMPLANT
YANKAUER SUCT BULB TIP NO VENT (SUCTIONS) ×2 IMPLANT

## 2019-09-15 NOTE — Progress Notes (Signed)
Assisted Dr. Foster with right, ultrasound guided, pectoralis block. Side rails up, monitors on throughout procedure. See vital signs in flow sheet. Tolerated Procedure well. 

## 2019-09-15 NOTE — Anesthesia Procedure Notes (Signed)
Procedure Name: LMA Insertion Performed by: Shivaay Stormont M, CRNA Pre-anesthesia Checklist: Patient identified, Emergency Drugs available, Suction available and Patient being monitored Patient Re-evaluated:Patient Re-evaluated prior to induction Oxygen Delivery Method: Circle system utilized Preoxygenation: Pre-oxygenation with 100% oxygen Induction Type: IV induction Ventilation: Mask ventilation without difficulty LMA: LMA inserted LMA Size: 4.0 Tube type: Oral Number of attempts: 1 Airway Equipment and Method: Bite block Placement Confirmation: positive ETCO2 Tube secured with: Tape Dental Injury: Teeth and Oropharynx as per pre-operative assessment        

## 2019-09-15 NOTE — Discharge Instructions (Signed)
[place] Office Phone Number [phone]  BREAST BIOPSY/ PARTIAL MASTECTOMY: POST OP INSTRUCTIONS  Always review your discharge instruction sheet given to you by the facility where your surgery was performed.  IF YOU HAVE DISABILITY OR FAMILY LEAVE FORMS, YOU MUST BRING THEM TO THE OFFICE FOR PROCESSING.  DO NOT GIVE THEM TO YOUR DOCTOR.  1. A prescription for pain medication may be given to you upon discharge.  Take your pain medication as prescribed, if needed.  If narcotic pain medicine is not needed, then you may take acetaminophen (Tylenol) or ibuprofen (Advil) as needed. 2. Take your usually prescribed medications unless otherwise directed 3. If you need a refill on your pain medication, please contact your pharmacy.  They will contact our office to request authorization.  Prescriptions will not be filled after 5pm or on week-ends. 4. You should eat very light the first 24 hours after surgery, such as soup, crackers, pudding, etc.  Resume your normal diet the day after surgery. 5. Most patients will experience some swelling and bruising in the breast.  Ice packs and a good support bra will help.  Swelling and bruising can take several days to resolve.  6. It is common to experience some constipation if taking pain medication after surgery.  Increasing fluid intake and taking a stool softener will usually help or prevent this problem from occurring.  A mild laxative (Milk of Magnesia or Miralax) should be taken according to package directions if there are no bowel movements after 48 hours. 7. Unless discharge instructions indicate otherwise, you may remove your bandages 24-48 hours after surgery, and you may shower at that time.  You may have steri-strips (small skin tapes) in place directly over the incision.  These strips should be left on the skin for 7-10 days.  If your surgeon used skin glue on the incision, you may shower in 24 hours.  The glue will flake off over the  next 2-3 weeks.  Any sutures or staples will be removed at the office during your follow-up visit. 8. ACTIVITIES:  You may resume regular daily activities (gradually increasing) beginning the next day.  Wearing a good support bra or sports bra minimizes pain and swelling.  You may have sexual intercourse when it is comfortable. a. You may drive when you no longer are taking prescription pain medication, you can comfortably wear a seatbelt, and you can safely maneuver your car and apply brakes. b. RETURN TO WORK:  ______________________________________________________________________________________ 9. You should see your doctor in the office for a follow-up appointment approximately two weeks after your surgery.  Your doctor's nurse will typically make your follow-up appointment when she calls you with your pathology report.  Expect your pathology report 2-3 business days after your surgery.  You may call to check if you do not hear from [place] after three days. 10. OTHER INSTRUCTIONS: _______________________________________________________________________________________________ _____________________________________________________________________________________________________________________________________ _____________________________________________________________________________________________________________________________________ _____________________________________________________________________________________________________________________________________  WHEN TO CALL YOUR DOCTOR: 1. Fever over 101.0 2. Nausea and/or vomiting. 3. Extreme swelling or bruising. 4. Continued bleeding from incision. 5. Increased pain, redness, or drainage from the incision.  The clinic staff is available to answer your questions during regular business hours.  Please don't hesitate to call and ask to speak to one of the nurses for clinical concerns.  If you have a medical emergency, go to the nearest  emergency room or call 911.  A surgeon from [facility] is always on call at the hospital.  For further questions, please visit [email or web address]  Post Anesthesia Home Care Instructions  Activity: Get plenty of rest for the remainder of the day. A responsible individual must stay with you for 24 hours following the procedure.  For the next 24 hours, DO NOT: -Drive a car -Paediatric nurse -Drink alcoholic beverages -Take any medication unless instructed by your physician -Make any legal decisions or sign important papers.  Meals: Start with liquid foods such as gelatin or soup. Progress to regular foods as tolerated. Avoid greasy, spicy, heavy foods. If nausea and/or vomiting occur, drink only clear liquids until the nausea and/or vomiting subsides. Call your physician if vomiting continues.  Special Instructions/Symptoms: Your throat may feel dry or sore from the anesthesia or the breathing tube placed in your throat during surgery. If this causes discomfort, gargle with warm salt water. The discomfort should disappear within 24 hours.  If you had a scopolamine patch placed behind your ear for the management of post- operative nausea and/or vomiting:  1. The medication in the patch is effective for 72 hours, after which it should be removed.  Wrap patch in a tissue and discard in the trash. Wash hands thoroughly with soap and water. 2. You may remove the patch earlier than 72 hours if you experience unpleasant side effects which may include dry mouth, dizziness or visual disturbances. 3. Avoid touching the patch. Wash your hands with soap and water after contact with the patch.  No tylenol until after 6pm     Regional Anesthesia Blocks  1. Numbness or the inability to move the "blocked" extremity may last from 3-48 hours after placement. The length of time depends on the medication injected and your individual response to the medication. If the numbness is  not going away after 48 hours, call your surgeon.  2. The extremity that is blocked will need to be protected until the numbness is gone and the  Strength has returned. Because you cannot feel it, you will need to take extra care to avoid injury. Because it may be weak, you may have difficulty moving it or using it. You may not know what position it is in without looking at it while the block is in effect.  3. For blocks in the legs and feet, returning to weight bearing and walking needs to be done carefully. You will need to wait until the numbness is entirely gone and the strength has returned. You should be able to move your leg and foot normally before you try and bear weight or walk. You will need someone to be with you when you first try to ensure you do not fall and possibly risk injury.  4. Bruising and tenderness at the needle site are common side effects and will resolve in a few days.  5. Persistent numbness or new problems with movement should be communicated to the surgeon or the [facility] [phone] [place] [phone]).

## 2019-09-15 NOTE — Anesthesia Procedure Notes (Signed)
Anesthesia Regional Block: Pectoralis block   Pre-Anesthetic Checklist: ,, timeout performed, Correct Patient, Correct Site, Correct Laterality, Correct Procedure, Correct Position, site marked, Risks and benefits discussed,  Surgical consent,  Pre-op evaluation,  At surgeon's request and post-op pain management  Laterality: Right  Prep: chloraprep       Needles:  Injection technique: Single-shot  Needle Type: Echogenic Stimulator Needle     Needle Length: 9cm  Needle Gauge: 21   Needle insertion depth: 5 cm   Additional Needles:   Procedures:,,,, ultrasound used (permanent image in chart),,,,  Narrative:  Start time: 09/15/2019 12:05 PM End time: 09/15/2019 12:10 PM Injection made incrementally with aspirations every 5 mL.  Performed by: Personally  Anesthesiologist: Josephine Igo, MD  Additional Notes: Timeout performed. Patient sedated. Relevant anatomy ID'd using [place]. Incremental 2-29ml injection of LA with frequent aspiration. Patient tolerated procedure well.        Right Pectoralis Block

## 2019-09-15 NOTE — Transfer of Care (Signed)
Immediate Anesthesia Transfer of Care Note  Patient: Kelly Dixon  Procedure(s) Performed: RIGHT BREAST LUMPECTOMY WITH RADIOACTIVE SEED AND SENTINEL LYMPH NODE BIOPSY (Right Breast)  Patient Location: PACU  Anesthesia Type:General  Level of Consciousness: awake, alert  and oriented  Airway & Oxygen Therapy: Patient Spontanous Breathing and Patient connected to face mask oxygen  Post-op Assessment: Report given to RN and Post -op Vital signs reviewed and stable  Post vital signs: Reviewed and stable  Last Vitals:  Vitals Value Taken Time  BP    Temp    Pulse    Resp    SpO2      Last Pain:  Vitals:   09/15/19 1142  TempSrc: Temporal  PainSc: 1       Patients Stated Pain Goal: 1 ([id number])  Complications: No apparent anesthesia complications

## 2019-09-15 NOTE — Interval H&P Note (Signed)
History and Physical Interval Note:  09/15/2019 12:57 PM  Kelly Dixon  has presented today for surgery, with the diagnosis of right breast invasive ductal carcinoma.  The various methods of treatment have been discussed with the patient and family. After consideration of risks, benefits and other options for treatment, the patient has consented to  Procedure(s): RIGHT BREAST LUMPECTOMY WITH RADIOACTIVE SEED AND SENTINEL LYMPH NODE BIOPSY (Right) as a surgical intervention.  The patient's history has been reviewed, patient examined, no change in status, stable for surgery.  I have reviewed the patient's chart and labs.  Questions were answered to the patient's satisfaction.     [name]

## 2019-09-15 NOTE — Anesthesia Preprocedure Evaluation (Signed)
Anesthesia Evaluation  Patient identified by MRN, date of birth, ID band Patient awake    Reviewed: Allergy & Precautions, NPO status , Patient's Chart, lab work & pertinent test results  History of Anesthesia Complications (+) PONV and history of anesthetic complications  Airway Mallampati: II  TM Distance: >3 FB Neck ROM: Full    Dental  (+) Edentulous Upper, Partial Lower   Pulmonary Current Smoker,    Pulmonary exam normal breath sounds clear to auscultation       Cardiovascular negative cardio ROS Normal cardiovascular exam Rhythm:Regular Rate:Normal     Neuro/Psych negative neurological ROS  negative psych ROS   GI/Hepatic GERD  Controlled and Medicated,  Endo/Other  Right breast invasive ductal carcinoma Obesity  Renal/GU negative Renal ROS  negative genitourinary   Musculoskeletal  (+) Arthritis , Osteoarthritis,    Abdominal (+) + obese,   Peds  Hematology negative hematology ROS (+)   Anesthesia Other Findings   Reproductive/Obstetrics                             Anesthesia Physical Anesthesia Plan  ASA: II  Anesthesia Plan: General   Post-op Pain Management:    Induction: Intravenous  PONV Risk Score and Plan: 4 or greater and Ondansetron, Treatment may vary due to age or medical condition and Dexamethasone  Airway Management Planned: LMA  Additional Equipment:   Intra-op Plan:   Post-operative Plan: Extubation in OR  Informed Consent: I have reviewed the patients History and Physical, chart, labs and discussed the procedure including the risks, benefits and alternatives for the proposed anesthesia with the patient or authorized representative who has indicated his/her understanding and acceptance.     Dental advisory given  Plan Discussed with: CRNA and Surgeon  Anesthesia Plan Comments:         Anesthesia Quick Evaluation

## 2019-09-15 NOTE — Anesthesia Postprocedure Evaluation (Signed)
Anesthesia Post Note  Patient: Kelly Dixon  Procedure(s) Performed: RIGHT BREAST LUMPECTOMY WITH RADIOACTIVE SEED AND SENTINEL LYMPH NODE BIOPSY (Right Breast)     Patient location during evaluation: PACU Anesthesia Type: General Level of consciousness: awake and alert and oriented Pain management: pain level controlled Vital Signs Assessment: post-procedure vital signs reviewed and stable Respiratory status: spontaneous breathing, nonlabored ventilation and respiratory function stable Cardiovascular status: blood pressure returned to baseline and stable Postop Assessment: no apparent nausea or vomiting Anesthetic complications: no    Last Vitals:  Vitals:   09/15/19 1500 09/15/19 1515  BP: (!) 158/84 137/67  Pulse: 65 60  Resp: 19 12  Temp:    SpO2: 100% 96%    Last Pain:  Vitals:   09/15/19 1515  TempSrc:   PainSc: 0-No pain                 Sonita Michiels A.

## 2019-09-15 NOTE — Progress Notes (Signed)
Emotional support during breast injections °

## 2019-09-15 NOTE — Op Note (Signed)
Pre-op Diagnosis:  Invasive ductal carcinoma Post-op Diagnosis: same Procedure:  Right radioactive seed localized lumpectomy/ right sentinel lymph node biopsy Surgeon:  Mashanda Ishibashi K. Anesthesia:  GEN - LMA/ Pec block Indications:  This is a 73 year old female who is status post laparoscopic cholecystectomy in July 2020 who recently underwent routine annual screening mammogram. She was found to have some asymmetry in the right breast. She underwent diagnostic mammogram and ultrasound that showed a suspicious 7 x 8 x 5 mm mass in the right upper outer quadrant at 10:00 5 cm from the nipple. She underwent biopsy of this area that revealed invasive ductal carcinoma grade 2 and DCIS. ER/PR positive. HER-2 negative. Ki-67 15%   Description of procedure: A radioactive seed was placed yesterday by radiology. In the preoperative area, the patient had been injected with technetium sulfur colloid by radiology.   The patient is brought to the operating room placed in supine position on the operating room table. After an adequate level of general anesthesia was obtained, we injected methylene blue dye solution around the nipple. Her right breast was prepped with ChloraPrep and draped in sterile fashion. A timeout was taken to ensure the proper patient and proper procedure. We interrogated the breast with the neoprobe. We made a circumareolar incision around the lateral side of the nipple after infiltrating with 0.25% Marcaine. Dissection was carried down in the breast tissue with cautery. We used the neoprobe to guide [place] towards the radioactive seed. We excised an area of tissue around the radioactive seed 2 cm in diameter. The specimen was removed and was oriented with a paint kit. Specimen mammogram showed the radioactive seed as well as the biopsy clip within the specimen.  Two adjacent biopsy clips were also within the specimen. This was sent for pathologic examination. There is no residual radioactivity  within the biopsy cavity. We inspected carefully for hemostasis. The wound was thoroughly irrigated.   We changed the settings on the neoprobe. We interrogated the axilla. There was minimal activity within the axilla. I made a transverse incision across the axilla. We dissected down into the axillary contents. There was minimal radioactivity noted just underneath the edge of the pectoralis muscle. I was able to dissect into this area and identified to blue lymph nodes and an additional possible lymph node. These were excised and sent as sentinel lymph node #1, possible lymph node #2, sentinel lymph node #3. The wound was irrigated and inspected for hemostasis.  Both wounds were closed with a deep layer of 3-0 Vicryl and a subcuticular layer of 4-0 Monocryl. Benzoin Steri-Strips were applied. The patient was then extubated and brought to the recovery room in stable condition. All sponge, instrument, and needle counts are correct.  Imogene Burn. Georgette Dover, MD, [facility]  General/ Trauma Surgery  09/15/2019 2:51 PM

## 2019-09-16 ENCOUNTER — Encounter: Payer: Self-pay | Admitting: *Deleted

## 2019-09-16 NOTE — Addendum Note (Signed)
Addendum  created 09/16/19 0958 by Tawni Millers, CRNA   Charge Capture section accepted

## 2019-09-17 ENCOUNTER — Encounter (HOSPITAL_COMMUNITY): Payer: Medicare HMO

## 2019-09-21 ENCOUNTER — Encounter: Payer: Self-pay | Admitting: *Deleted

## 2019-09-21 LAB — SURGICAL PATHOLOGY

## 2019-09-23 ENCOUNTER — Telehealth: Payer: Self-pay | Admitting: Hematology and Oncology

## 2019-09-23 NOTE — Telephone Encounter (Signed)
Called patient per providers request, rescheduled new patient appointment to 01/05.

## 2019-09-29 ENCOUNTER — Ambulatory Visit: Payer: Medicare HMO | Admitting: Hematology and Oncology

## 2019-10-04 NOTE — Progress Notes (Signed)
[place] CONSULT NOTE  Patient Care Team: Shelda Pal, DO as PCP - General (Family Medicine) Mauro Kaufmann, RN as Oncology Nurse Navigator Rockwell Germany, RN as Oncology Nurse Navigator  CHIEF COMPLAINTS/PURPOSE OF CONSULTATION:  Newly diagnosed breast cancer  HISTORY OF PRESENTING ILLNESS:  Kelly Dixon 74 y.o. female is here because of recent diagnosis of invasive ductal carcinoma of the right breast. The cancer was detected on a routine screening mammogram on 07/28/19 and was not palpable. Diagnostic mammogram and [place] on 08/09/19 showed a 0.8cm mass at the 10 o'clock position in the right breast and no right axillary adenopathy. Biopsy on 08/31/19 showed invasive ductal carcinoma with DCIS, grade 2, HER-2 negative (1+), ER+ 100%, PR 70%, Ki67 15%. Additional right breast biopsy on 09/06/19 showed usual ductal hyperplasia, fibrocystic changes, and no evidence of malignancy at the 10 and 11 o'clock positions. She underwent a lumpectomy on 09/15/19 with Dr. Georgette Dover for which pathology showed invasive ductal carcinoma with DCIS, 0.9cm, clear margins, 3 right axillary lymph nodes negative for carcinoma. She presents to the clinic today for initial evaluation and discussion of treatment options.   I reviewed her records extensively and collaborated the history with the patient.  SUMMARY OF ONCOLOGIC HISTORY: Oncology History  Malignant neoplasm of upper-outer quadrant of right breast in female, estrogen receptor positive ([place])  09/08/2019 Initial Diagnosis   Routine screening mammogram detected a 0.8cm mass at the 10 o'clock position in the right breast, no right axillary adenopathy. Biopsy showed IDC with DCIS, grade 2, HER-2 - (1+), ER+ 100%, PR 70%, Ki67 15%.    09/08/2019 Cancer Staging   Staging form: Breast, AJCC 8th Edition - Clinical: Stage IA (cT1b, cN0, cM0, G2, ER+, PR+, HER2-) - Signed by Gardenia Phlegm, NP on 09/08/2019   09/15/2019 Surgery   Right lumpectomy ([facility]): IDC with DCIS, 0.9cm, clear margins, 3 right axillary lymph nodes negative.      MEDICAL HISTORY:  Past Medical History:  Diagnosis Date  . Arthritis   . Gallstones 05/31/2018  . GERD (gastroesophageal reflux disease)   . PONV (postoperative nausea and vomiting)   . Syphilis     SURGICAL HISTORY: Past Surgical History:  Procedure Laterality Date  . BREAST LUMPECTOMY WITH RADIOACTIVE SEED AND SENTINEL LYMPH NODE BIOPSY Right 09/15/2019   Procedure: RIGHT BREAST LUMPECTOMY WITH RADIOACTIVE SEED AND SENTINEL LYMPH NODE BIOPSY;  Surgeon: Donnie Mesa, MD;  Location: [facility];  Service: General;  Laterality: Right;  . CHOLECYSTECTOMY N/A 04/13/2019   Procedure: LAPAROSCOPIC CHOLECYSTECTOMY WITH INTRAOPERATIVE CHOLANGIOGRAM;  Surgeon: Donnie Mesa, MD;  Location: [facility];  Service: General;  Laterality: N/A;    SOCIAL HISTORY: Social History   Socioeconomic History  . Marital status: Divorced    Spouse name: Not on file  . Number of children: Not on file  . Years of education: Not on file  . Highest education level: Not on file  Occupational History  . Not on file  Tobacco Use  . Smoking status: Current Every Day Smoker    Packs/day: 0.50  . Smokeless tobacco: Never Used  Substance and Sexual Activity  . Alcohol use: Yes    Comment: rarely  . Drug use: Never  . Sexual activity: Not on file  Other Topics Concern  . Not on file  Social History Narrative  . Not on file   Social Determinants of Health   Financial Resource Strain:   . Difficulty of Paying Living Expenses: Not  on file  Food Insecurity:   . Worried About Charity fundraiser in the Last Year: Not on file  . Ran Out of Food in the Last Year: Not on file  Transportation Needs:   . Lack of Transportation (Medical): Not on file  . Lack of Transportation (Non-Medical): Not on file  Physical Activity:   . Days of Exercise per Week: Not on file  . Minutes of Exercise  per Session: Not on file  Stress:   . Feeling of Stress : Not on file  Social Connections:   . Frequency of Communication with Friends and Family: Not on file  . Frequency of Social Gatherings with Friends and Family: Not on file  . Attends Religious Services: Not on file  . Active Member of Clubs or Organizations: Not on file  . Attends Archivist Meetings: Not on file  . Marital Status: Not on file  Intimate Partner Violence:   . Fear of Current or Ex-Partner: Not on file  . Emotionally Abused: Not on file  . Physically Abused: Not on file  . Sexually Abused: Not on file    FAMILY HISTORY: Family History  Problem Relation Age of Onset  . Arthritis Mother   . Hearing loss Mother   . Heart disease Father   . Arthritis Sister     ALLERGIES:  has No Known Allergies.  MEDICATIONS:  Current Outpatient Medications  Medication Sig Dispense Refill  . loratadine (CLARITIN) 10 MG tablet Take 10 mg by mouth daily.    . meloxicam (MOBIC) 7.5 MG tablet Take 1 tablet (7.5 mg total) by mouth daily. 90 tablet 0   No current facility-administered medications for this visit.    REVIEW OF SYSTEMS:   Constitutional: Denies fevers, chills or abnormal night sweats Eyes: Denies blurriness of vision, double vision or watery eyes Ears, nose, mouth, throat, and face: Denies mucositis or sore throat Respiratory: Denies cough, dyspnea or wheezes Cardiovascular: Denies palpitation, chest discomfort or lower extremity swelling Gastrointestinal:  Denies nausea, heartburn or change in bowel habits Skin: Denies abnormal skin rashes Lymphatics: Denies new lymphadenopathy or easy bruising Neurological:Denies numbness, tingling or new weaknesses Behavioral/Psych: Mood is stable, no new changes  Breast: s/p left lumpectomy All other systems were reviewed with the patient and are negative.  PHYSICAL EXAMINATION: ECOG PERFORMANCE STATUS: 1 - Symptomatic but completely ambulatory  Vitals:    10/05/19 1255  BP: (!) 157/79  Pulse: 70  Resp: 17  Temp: 97.7 F (36.5 C)  SpO2: 100%   Filed Weights   10/05/19 1255  Weight: 198 lb 9.6 oz (90.1 kg)    GENERAL:alert, no distress and comfortable SKIN: skin color, texture, turgor are normal, no rashes or significant lesions EYES: normal, conjunctiva are pink and non-injected, sclera clear OROPHARYNX:no exudate, no erythema and lips, buccal mucosa, and tongue normal  NECK: supple, thyroid normal size, non-tender, without nodularity LYMPH:  no palpable lymphadenopathy in the cervical, axillary or inguinal LUNGS: clear to auscultation and percussion with normal breathing effort HEART: regular rate & rhythm and no murmurs and no lower extremity edema ABDOMEN:abdomen soft, non-tender and normal bowel sounds Musculoskeletal:no cyanosis of digits and no clubbing, arthritis in the knees PSYCH: alert & oriented x 3 with fluent speech NEURO: no focal motor/sensory deficits BREAST: Recent lumpectomy, well-healed scars  LABORATORY DATA:  I have reviewed the data as listed Lab Results  Component Value Date   WBC 4.1 07/22/2019   HGB 13.1 07/22/2019   HCT 38.8  07/22/2019   MCV 89.6 07/22/2019   PLT 214 07/22/2019   Lab Results  Component Value Date   NA 142 07/22/2019   K 4.3 07/22/2019   CL 106 07/22/2019   CO2 27 07/22/2019    RADIOGRAPHIC STUDIES: I have personally reviewed the radiological reports and agreed with the findings in the report.  ASSESSMENT AND PLAN:  Malignant neoplasm of upper-outer quadrant of right breast in female, estrogen receptor positive ([place]) 09/15/2019:Right lumpectomy ([facility]): IDC with DCIS, 0.9cm, clear margins, 3 right axillary lymph nodes negative.  ER 100%, PR 70%, Ki-67 15%, HER-2 negative, T1BN0 stage Ia pathologic stage  Pathology and radiology counseling:Discussed with the patient, the details of pathology including the type of breast cancer,the clinical staging, the significance of ER, PR  and HER-2/neu receptors and the implications for treatment. After reviewing the pathology in detail, we proceeded to discuss the different treatment options between radiation, chemotherapy, antiestrogen therapies.  Recommendations: 1.  Given her favorable prognostic features and the size of the tumor, we decided not to do Oncotype 2.  Patient is not keen on doing radiation therapy.  However we will request radiation consultation.  We discussed the recently presented data for radiation and favorable prognostic categories of elderly patients and there was no survival advantage but it does decrease local recurrence rates. She would like to hear from radiation oncologist about the pros and cons of radiation and make a decision on that. 3. Adjuvant antiestrogen therapy: I sent a prescription for anastrozole to [place].   Return to clinic in 3 months for survivorship visit.  After that she can be seen once a year if she tolerates it very well.  All questions were answered. The patient knows to call the clinic with any problems, questions or concerns.   Rulon Eisenmenger, MD, MPH 10/05/2019    I, Molly Dorshimer, am acting as scribe for Nicholas Lose, MD.  I have reviewed the above documentation for accuracy and completeness, and I agree with the above.

## 2019-10-05 ENCOUNTER — Other Ambulatory Visit: Payer: Self-pay

## 2019-10-05 ENCOUNTER — Encounter: Payer: Self-pay | Admitting: *Deleted

## 2019-10-05 ENCOUNTER — Inpatient Hospital Stay: Payer: Medicare HMO | Attending: Hematology and Oncology | Admitting: Hematology and Oncology

## 2019-10-05 DIAGNOSIS — Z170 Estrogen receptor positive status [ER+]: Secondary | ICD-10-CM | POA: Insufficient documentation

## 2019-10-05 DIAGNOSIS — C50411 Malignant neoplasm of upper-outer quadrant of right female breast: Secondary | ICD-10-CM | POA: Insufficient documentation

## 2019-10-05 MED ORDER — ANASTROZOLE 1 MG PO TABS: 1.0000 mg | ORAL_TABLET | Freq: Every day | ORAL | 3 refills | Status: DC

## 2019-10-05 MED ORDER — CALCIUM CARBONATE-VITAMIN D 500-200 MG-UNIT PO TABS: 1.0000 | ORAL_TABLET | Freq: Two times a day (BID) | ORAL | 3 refills | Status: DC

## 2019-10-05 NOTE — Assessment & Plan Note (Signed)
09/15/2019:Right lumpectomy ([facility]): IDC with DCIS, 0.9cm, clear margins, 3 right axillary lymph nodes negative.  ER 100%, PR 70%, Ki-67 15%, HER-2 negative, T1BN0 stage Ia pathologic stage  Pathology and radiology counseling:Discussed with the patient, the details of pathology including the type of breast cancer,the clinical staging, the significance of ER, PR and HER-2/neu receptors and the implications for treatment. After reviewing the pathology in detail, we proceeded to discuss the different treatment options between radiation, chemotherapy, antiestrogen therapies.  Recommendations: 1. Oncotype DX testing to determine if chemotherapy would be of any benefit followed by 2. Adjuvant radiation therapy followed by 3. Adjuvant antiestrogen therapy  Oncotype counseling: I discussed Oncotype DX test. I explained to the patient that this is a 21 gene panel to evaluate patient tumors DNA to calculate recurrence score. This would help determine whether patient has high risk or intermediate risk or low risk breast cancer. She understands that if her tumor was found to be high risk, she would benefit from systemic chemotherapy. If low risk, no need of chemotherapy. If she was found to be intermediate risk, we would need to evaluate the score as well as other risk factors and determine if an abbreviated chemotherapy may be of benefit.  Return to clinic based on Oncotype DX test result.

## 2019-10-06 ENCOUNTER — Encounter: Payer: Self-pay | Admitting: *Deleted

## 2019-10-06 NOTE — Progress Notes (Signed)
Location of Breast Cancer: Right Breast  Histology per Pathology Report:  08/31/2019 Diagnosis Breast, right, needle core biopsy, 10 o'clock - INVASIVE DUCTAL CARCINOMA. - DUCTAL CARCINOMA IN SITU  Receptor Status: ER(100%), PR (70%), Her2-neu (NEG), Ki-(15%)  09/15/19 FINAL MICROSCOPIC DIAGNOSIS: A. BREAST, RIGHT W/SEED, LUMPECTOMY: - Invasive ductal carcinoma, 0.9 cm. - Ductal carcinoma in situ. - Margins not involved. - Biopsy site and biopsy clip. B. SENTINEL LYMPH NODE, RIGHT #1, EXCISION: - One lymph node with no metastatic carcinoma (0/1). C. SENTINEL LYMPH NODE, RIGHT #2, EXCISION: - One lymph node with no metastatic carcinoma (0/1). D. SENTINEL LYMPH NODE, RIGHT #3, EXCISION: - One lymph node with no metastatic carcinoma (0/1).  Did patient present with symptoms or was this found on screening mammography?: It was found on a screening mammogram.   Past/Anticipated interventions by surgeon, if any: 09/15/19 Procedure:  Right radioactive seed localized lumpectomy/ right sentinel lymph node biopsy Surgeon:  Donnie Mesa K.  Past/Anticipated interventions by medical oncology, if any: 10/05/19 Dr. Lindi Adie Recommendations: 1.  Given her favorable prognostic features and the size of the tumor, we decided not to do Oncotype 2.  Patient is not keen on doing radiation therapy.  However we will request radiation consultation.  We discussed the recently presented data for radiation and favorable prognostic categories of elderly patients and there was no survival advantage but it does decrease local recurrence rates. She would like to hear from radiation oncologist about the pros and cons of radiation and make a decision on that. 3. Adjuvant antiestrogen therapy: I sent a prescription for anastrozole to [place].  Return to clinic in 3 months for survivorship visit.  After that she can be seen once a year if she tolerates it very well  Lymphedema issues, if any:  She  denies.  Pain issues, if any:  She reports minor pain to her surgical site.   SAFETY ISSUES:  Prior radiation? No  Pacemaker/ICD? No  Possible current pregnancy? No  Is the patient on methotrexate? No  Current Complaints / other details:      Harly Pipkins, Stephani Police, RN 10/06/2019,3:07 PM

## 2019-10-07 NOTE — Progress Notes (Signed)
Radiation Oncology         [phone] ________________________________  Initial outpatient MyChart Video Consultation  Name: Kelly Dixon MRN: [id number]  Date: 10/08/2019  DOB: 14-Oct-1945  BH:[id number], Crosby Oyster, DO  Nicholas Lose, MD   REFERRING PHYSICIAN: Nicholas Lose, MD  DIAGNOSIS:    ICD-10-CM   1. Malignant neoplasm of upper-outer quadrant of right breast in female, estrogen receptor positive ([place])  C50.411    Z17.0      Cancer Staging Malignant neoplasm of upper-outer quadrant of right breast in female, estrogen receptor positive ([place]) Staging form: Breast, AJCC 8th Edition - Clinical: Stage IA (cT1b, cN0, cM0, G2, ER+, PR+, HER2-) - Signed by Gardenia Phlegm, NP on 09/08/2019  Stage IA (pT1b, pN0) Right Breast UOQ Invasive Ductal Carcinoma with DCIS, ER+ / PR+ / Her2-, Grade 2  CHIEF COMPLAINT: Here to discuss management of right breast cancer  HISTORY OF PRESENT ILLNESS::Kelly Dixon is a 74 y.o. female who presented with breast abnormality on the following imaging: screening mammogram on the date of 07/28/2019.  Symptoms, if any, at that time, were: none.   Ultrasound of breast on 08/09/2019 revealed suspicious 0.8 cm in right breast at 10 o'clock.   Biopsy on date of 08/31/2019 showed invasive ductal carcinoma with DCIS.  ER status: positive; PR status positive, Her2 status negative; Grade 2. Additional biopsies were taken from 10 o'clock and 11 o'clock on 09/06/2019, and both showed fibrocystic change with usual ductal hyperplasia.  She opted to proceed with right lumpectomy on date of 09/15/2019 with pathology report revealing: tumor size of 0.9 cm; histology of ductal carcinoma; margin status to invasive disease of 9 mm and margin status to in situ disease of 9 mm; nodal status of negative (0/3); Grade 2.  She swims for exercise.  PREVIOUS RADIATION THERAPY: No  PAST MEDICAL HISTORY:  has a past medical history of Arthritis, Gallstones (05/31/2018),  GERD (gastroesophageal reflux disease), PONV (postoperative nausea and vomiting), and Syphilis.    PAST SURGICAL HISTORY: Past Surgical History:  Procedure Laterality Date  . BREAST LUMPECTOMY WITH RADIOACTIVE SEED AND SENTINEL LYMPH NODE BIOPSY Right 09/15/2019   Procedure: RIGHT BREAST LUMPECTOMY WITH RADIOACTIVE SEED AND SENTINEL LYMPH NODE BIOPSY;  Surgeon: Donnie Mesa, MD;  Location: [facility];  Service: General;  Laterality: Right;  . CHOLECYSTECTOMY N/A 04/13/2019   Procedure: LAPAROSCOPIC CHOLECYSTECTOMY WITH INTRAOPERATIVE CHOLANGIOGRAM;  Surgeon: Donnie Mesa, MD;  Location: [facility];  Service: General;  Laterality: N/A;    FAMILY HISTORY: family history includes Arthritis in her mother and sister; Hearing loss in her mother; Heart disease in her father.  SOCIAL HISTORY:  reports that she has been smoking. She has been smoking about 0.50 packs per day. She has never used smokeless tobacco. She reports current alcohol use. She reports that she does not use drugs.  ALLERGIES: Patient has no known allergies.  MEDICATIONS:  Current Outpatient Medications  Medication Sig Dispense Refill  . loratadine (CLARITIN) 10 MG tablet Take 10 mg by mouth daily.    . meloxicam (MOBIC) 7.5 MG tablet Take 1 tablet (7.5 mg total) by mouth daily. 90 tablet 0  . anastrozole (ARIMIDEX) 1 MG tablet Take 1 tablet (1 mg total) by mouth daily. (Patient not taking: Reported on 10/08/2019) 90 tablet 3  . calcium-vitamin D (OSCAL WITH D) 500-200 MG-UNIT tablet Take 1 tablet by mouth 2 (two) times daily. (Patient not taking: Reported on 10/08/2019) 180 tablet 3   No current facility-administered medications  for this encounter.    REVIEW OF SYSTEMS: As above.   PHYSICAL EXAM:  vitals were not taken for this visit.   General: Alert and oriented, in no acute distress   LABORATORY DATA:  Lab Results  Component Value Date   WBC 4.1 07/22/2019   HGB 13.1 07/22/2019   HCT 38.8 07/22/2019    MCV 89.6 07/22/2019   PLT 214 07/22/2019   CMP     Component Value Date/Time   NA 142 07/22/2019 0823   K 4.3 07/22/2019 0823   CL 106 07/22/2019 0823   CO2 27 07/22/2019 0823   GLUCOSE 101 (H) 07/22/2019 0823   BUN 13 07/22/2019 0823   CREATININE 0.79 07/22/2019 0823   CALCIUM 9.2 07/22/2019 0823   PROT 6.9 07/22/2019 0823   AST 13 07/22/2019 0823   ALT 9 07/22/2019 0823   BILITOT 0.6 07/22/2019 0823   GFRNONAA >60 04/06/2019 1020   GFRAA >60 04/06/2019 1020         RADIOGRAPHY: NM Sentinel Node Inj-No Rpt (Breast)  Result Date: 09/15/2019 Sulfur colloid was injected by the nuclear medicine technologist for melanoma sentinel node.   MM Breast Surgical Specimen  Result Date: 09/15/2019 CLINICAL DATA:  Patient is post radioactive seed localization subsequent surgical excision right breast. EXAM: SPECIMEN RADIOGRAPH OF THE RIGHT BREAST COMPARISON:  Previous exam(s). FINDINGS: Status post excision of the right breast. The radioactive seed and adjacent ribbon shaped biopsy marker clip are present within the center of the specimen, completely intact, and were marked for pathology. The coil shaped clip and heart shaped clips are also present within the specimen. Results called to the OR at the time of dictation. IMPRESSION: Specimen radiograph of the right breast. Electronically Signed   By: Marin Olp M.D.   On: 09/15/2019 14:12   MM RT RADIOACTIVE SEED LOC MAMMO GUIDE  Result Date: 09/14/2019 CLINICAL DATA:  Patient presents for seed localization prior to lumpectomy for grade 2 invasive ductal carcinoma in the RIGHT breast. EXAM: MAMMOGRAPHIC GUIDED RADIOACTIVE SEED LOCALIZATION OF THE RIGHT BREAST COMPARISON:  Previous exam(s). FINDINGS: Patient presents for radioactive seed localization prior to lumpectomy. I met with the patient and we discussed the procedure of seed localization including benefits and alternatives. We discussed the high likelihood of a successful procedure. We  discussed the risks of the procedure including infection, bleeding, tissue injury and further surgery. We discussed the low dose of radioactivity involved in the procedure. Informed, written consent was given. The usual time-out protocol was performed immediately prior to the procedure. Using mammographic guidance, sterile technique, 1% lidocaine and an I-125 radioactive seed, the ribbon shaped clip in the UPPER-OUTER QUADRANT of the RIGHT breast was localized using a LATERAL to MEDIAL approach. The follow-up mammogram images confirm the seed in the expected location and were marked for Dr. Georgette Dover. Follow-up survey of the patient confirms presence of the radioactive seed. Order number of I-125 seed:  [id number] . Total activity:  1.700 millicuries reference Date: 08/30/2019 The patient tolerated the procedure well and was released from the [facility]. She was given instructions regarding seed removal. IMPRESSION: Radioactive seed localization right breast. No apparent complications. Electronically Signed   By: [name] M.D.   On: 09/14/2019 15:24      IMPRESSION/PLAN: Right Breast Cancer   It was a pleasure meeting the patient today. We discussed the risks, benefits, and side effects of radiotherapy. I recommend radiotherapy to the right breast to reduce her risk of locoregional recurrence by  2/3.  We discussed that radiation would take approximately 3-4 weeks to complete and that I would give the patient a few weeks to heal following surgery before starting treatment planning. We spoke about acute effects including skin irritation and fatigue as well as much less common late effects including internal organ injury or irritation. We spoke about the latest technology that is used to minimize the risk of late effects for patients undergoing radiotherapy to the breast or chest wall. No guarantees of treatment were given.   She understands RT is optional, especially since her risk of relapse is low with  the antiestrogen pill that she will take for several years.  Based on randomized data, RT adds a local control benefit but not an overall survival benefit.  She would like to decline RT and proceed with antiestrogens only.  I think this decision is reasonable and wished her the best. I will see her back PRN and she knows to follow with med/onc and continue regular imaging for breast screening.  This encounter was provided by telemedicine platform MyChart Video for pandemic precautions. The patient has given verbal consent for this type of encounter and has been advised to only accept a meeting of this type in a secure network environment. The total time spent during this encounter on date of service was 45 minutes. The attendants for this meeting include Eppie Gibson  and Lisa Roca.  During the encounter, Eppie Gibson was located at [facility] Radiation Oncology Department.  Kelly Dixon was located at home.    __________________________________________   Eppie Gibson, MD   This document serves as a record of services personally performed by Eppie Gibson, MD. It was created on her behalf by Wilburn Mylar, a trained medical scribe. The creation of this record is based on the scribe's personal observations and the provider's statements to them. This document has been checked and approved by the attending provider.

## 2019-10-08 ENCOUNTER — Encounter: Payer: Self-pay | Admitting: Radiation Oncology

## 2019-10-08 ENCOUNTER — Other Ambulatory Visit: Payer: Self-pay

## 2019-10-08 ENCOUNTER — Ambulatory Visit
Admission: RE | Admit: 2019-10-08 | Discharge: 2019-10-08 | Disposition: A | Discharge status: Home / Self Care | Payer: Medicare HMO | Source: Ambulatory Visit | Attending: Radiation Oncology | Admitting: Radiation Oncology

## 2019-10-08 DIAGNOSIS — Z170 Estrogen receptor positive status [ER+]: Secondary | ICD-10-CM | POA: Diagnosis not present

## 2019-10-08 DIAGNOSIS — Z9889 Other specified postprocedural states: Secondary | ICD-10-CM | POA: Diagnosis not present

## 2019-10-08 DIAGNOSIS — C50411 Malignant neoplasm of upper-outer quadrant of right female breast: Secondary | ICD-10-CM | POA: Diagnosis not present

## 2019-10-11 ENCOUNTER — Encounter: Payer: Self-pay | Admitting: *Deleted

## 2019-10-12 ENCOUNTER — Telehealth: Payer: Self-pay | Admitting: Adult Health

## 2019-10-12 NOTE — Telephone Encounter (Signed)
Scheduled per 1/11 sch msg. Called and spoke with pt, confirmed 4/21 appt

## 2019-11-25 ENCOUNTER — Ambulatory Visit: Payer: Medicare HMO | Attending: Internal Medicine

## 2019-11-25 DIAGNOSIS — Z23 Encounter for immunization: Secondary | ICD-10-CM | POA: Insufficient documentation

## 2019-11-25 NOTE — Progress Notes (Signed)
   Covid-19 Vaccination Clinic  Name:  Kelly Dixon    MRN: [id number] DOB: [birth year]  11/25/2019  [name] was observed post Covid-19 immunization for 15 minutes without incidence. She was provided with Vaccine Information Sheet and instruction to access the V-Safe system.   [name] was instructed to call 911 with any severe reactions post vaccine: Marland Kitchen Difficulty breathing  . Swelling of your face and throat  . A fast heartbeat  . A bad rash all over your body  . Dizziness and weakness    Immunizations Administered    Name Date Dose VIS Date Route   Pfizer COVID-19 Vaccine 11/25/2019 10:26 AM 0.3 mL 09/10/2019 Intramuscular   Manufacturer: [place]   Lot: [id number]   [place]: [id number]

## 2019-12-15 ENCOUNTER — Ambulatory Visit: Payer: Medicare HMO | Attending: Internal Medicine

## 2019-12-15 DIAGNOSIS — Z23 Encounter for immunization: Secondary | ICD-10-CM

## 2019-12-15 NOTE — Progress Notes (Signed)
   Covid-19 Vaccination Clinic  Name:  Kelly Dixon    MRN: [id number] DOB: December 28, 1945  12/15/2019  [name] was observed post Covid-19 immunization for 15 minutes without incident. She was provided with Vaccine Information Sheet and instruction to access the V-Safe system.   [name] was instructed to call 911 with any severe reactions post vaccine: Marland Kitchen Difficulty breathing  . Swelling of face and throat  . A fast heartbeat  . A bad rash all over body  . Dizziness and weakness   Immunizations Administered    Name Date Dose VIS Date Route   Pfizer COVID-19 Vaccine 12/15/2019 12:26 PM 0.3 mL 09/10/2019 Intramuscular   Manufacturer: [place]   Lot: [id number]   [place]: [id number]

## 2019-12-29 NOTE — Progress Notes (Signed)
Nurse connected with patient 12/30/19 at  9:00 AM EDT by a telephone enabled telemedicine application and verified that I am speaking with the correct person using two identifiers. Patient stated full name and DOB. Patient gave permission to continue with virtual visit. Patient's location was at home and Nurse's location was at [place] office.   Subjective:   Kelly Dixon is a 74 y.o. female who presents for Medicare Annual (Subsequent) preventive examination.  Enjoys reading and going to church.  Substitute teaching again full time.   Review of Systems:  Home Safety/Smoke Alarms: Feels safe in home. Smoke alarms in place.  Lives w/ niece in 2 story home.   Female:         Mammo-07/28/19       Dexa scan-07/27/18.        CCS- Cologuard 06/29/18. Negative.    Objective:     Vitals: Unable to assess. This visit is enabled though telemedicine due to Covid 19.   Advanced Directives 12/30/2019 10/08/2019 09/15/2019 09/13/2019 04/06/2019 12/28/2018  Does Patient Have a Medical Advance Directive? No No No No No No  Would patient like information on creating a medical advance directive? No - Patient declined No - Patient declined No - Patient declined No - Patient declined No - Patient declined No - Patient declined    Tobacco Social History   Tobacco Use  Smoking Status Former Smoker  . Packs/day: 0.50  . Quit date: 11/29/2019  . Years since quitting: 0.0  Smokeless Tobacco Never Used     Counseling given: Not Answered   Clinical Intake:     Pain : 0-10 Pain Score: 4  Pain Location: Knee Pain Orientation: Right, Left Pain Descriptors / Indicators: Aching Pain Frequency: Constant Pain Relieving Factors: meloxicam Effect of Pain on Daily Activities: decreased exercise  Pain Relieving Factors: meloxicam              Past Medical History:  Diagnosis Date  . Arthritis   . Gallstones 05/31/2018  . GERD (gastroesophageal reflux disease)   . PONV (postoperative nausea  and vomiting)   . Syphilis    Past Surgical History:  Procedure Laterality Date  . BREAST LUMPECTOMY WITH RADIOACTIVE SEED AND SENTINEL LYMPH NODE BIOPSY Right 09/15/2019   Procedure: RIGHT BREAST LUMPECTOMY WITH RADIOACTIVE SEED AND SENTINEL LYMPH NODE BIOPSY;  Surgeon: Donnie Mesa, MD;  Location: [facility];  Service: General;  Laterality: Right;  . CHOLECYSTECTOMY N/A 04/13/2019   Procedure: LAPAROSCOPIC CHOLECYSTECTOMY WITH INTRAOPERATIVE CHOLANGIOGRAM;  Surgeon: Donnie Mesa, MD;  Location: [facility] OR;  Service: General;  Laterality: N/A;   Family History  Problem Relation Age of Onset  . Arthritis Mother   . Hearing loss Mother   . Heart disease Father   . Arthritis Sister    Social History   Socioeconomic History  . Marital status: Divorced    Spouse name: Not on file  . Number of children: Not on file  . Years of education: Not on file  . Highest education level: Not on file  Occupational History  . Not on file  Tobacco Use  . Smoking status: Former Smoker    Packs/day: 0.50    Quit date: 11/29/2019    Years since quitting: 0.0  . Smokeless tobacco: Never Used  Substance and Sexual Activity  . Alcohol use: Yes    Comment: rarely  . Drug use: Never  . Sexual activity: Not on file  Other Topics Concern  . Not on file  Social History Narrative  . Not on file   Social Determinants of Health   Financial Resource Strain: Low Risk   . Difficulty of Paying Living Expenses: Not hard at all  Food Insecurity: No Food Insecurity  . Worried About Charity fundraiser in the Last Year: Never true  . Ran Out of Food in the Last Year: Never true  Transportation Needs: No Transportation Needs  . Lack of Transportation (Medical): No  . Lack of Transportation (Non-Medical): No  Physical Activity:   . Days of Exercise per Week:   . Minutes of Exercise per Session:   Stress:   . Feeling of Stress :   Social Connections:   . Frequency of Communication with  Friends and Family:   . Frequency of Social Gatherings with Friends and Family:   . Attends Religious Services:   . Active Member of Clubs or Organizations:   . Attends Archivist Meetings:   Marland Kitchen Marital Status:     Outpatient Encounter Medications as of 12/30/2019  Medication Sig  . anastrozole (ARIMIDEX) 1 MG tablet Take 1 tablet (1 mg total) by mouth daily.  . calcium-vitamin D (OSCAL WITH D) 500-200 MG-UNIT tablet Take 1 tablet by mouth 2 (two) times daily.  Marland Kitchen loratadine (CLARITIN) 10 MG tablet Take 10 mg by mouth daily.  . meloxicam (MOBIC) 7.5 MG tablet Take 1 tablet (7.5 mg total) by mouth daily.   No facility-administered encounter medications on file as of 12/30/2019.    Activities of Daily Living In your present state of health, do you have any difficulty performing the following activities: 12/30/2019 09/15/2019  Hearing? N N  Vision? N N  Difficulty concentrating or making decisions? N N  Walking or climbing stairs? N N  Dressing or bathing? N N  Doing errands, shopping? N -  Preparing Food and eating ? N -  Using the Toilet? N -  In the past six months, have you accidently leaked urine? N -  Do you have problems with loss of bowel control? N -  Managing your Medications? N -  Managing your Finances? N -  Housekeeping or managing your Housekeeping? N -  Some recent data might be hidden    Patient Care Team: Shelda Pal, DO as PCP - General (Family Medicine) Mauro Kaufmann, RN as Oncology Nurse Navigator Rockwell Germany, RN as Oncology Nurse Navigator Donnie Mesa, MD as Consulting Physician (General Surgery) Nicholas Lose, MD as Consulting Physician (Hematology and Oncology) Eppie Gibson, MD as Attending Physician (Radiation Oncology)    Assessment:   This is a routine wellness examination for Kelly Dixon. Physical assessment deferred to PCP.  Exercise Activities and Dietary recommendations Current Exercise Habits: The patient does not  participate in regular exercise at present, Exercise limited by: None identified Diet (meal preparation, eat out, water intake, caffeinated beverages, dairy products, fruits and vegetables): in general, an "unhealthy" diet  Education provided.   Goals    . Increase physical activity       Fall Risk Fall Risk  12/30/2019 12/28/2018  Falls in the past year? 0 0  Number falls in past yr: 0 -  Injury with Fall? 0 -  Follow up Education provided;Falls prevention discussed -   Depression Screen PHQ 2/9 Scores 12/30/2019 12/28/2018  PHQ - 2 Score 0 0     Cognitive Function Ad8 score reviewed for issues:  Issues making decisions:no  Less interest in hobbies / activities:no  Repeats questions, stories (family  complaining):no  Trouble using ordinary gadgets (microwave, computer, phone):no  Forgets the month or year: no  Mismanaging finances: no  Remembering appts:no  Daily problems with thinking and/or memory:no Ad8 score is=0         Immunization History  Administered Date(s) Administered  . PFIZER SARS-COV-2 Vaccination 11/25/2019, 12/15/2019  . PPD Test 06/24/2018  . Pneumococcal Conjugate-13 05/27/2018  . Tdap 05/27/2018    Screening Tests Health Maintenance  Topic Date Due  . PNA vac Low Risk Adult (2 of 2 - PPSV23) 05/28/2019  . INFLUENZA VACCINE  04/30/2020  . MAMMOGRAM  07/27/2021  . TETANUS/TDAP  05/27/2028  . COLONOSCOPY  06/29/2028  . DEXA SCAN  Completed  . Hepatitis C Screening  Completed      Plan:    Please schedule your next medicare wellness visit with me in 1 yr.  Continue to eat heart healthy diet (full of fruits, vegetables, whole grains, lean protein, water--limit salt, fat, and sugar intake) and increase physical activity as tolerated.  Bring a copy of your living will and/or healthcare power of attorney to your next office visit.    I have personally reviewed and noted the following in the patient's chart:   . Medical and social  history . Use of alcohol, tobacco or illicit drugs  . Current medications and supplements . Functional ability and status . Nutritional status . Physical activity . Advanced directives . List of other physicians . Hospitalizations, surgeries, and ER visits in previous 12 months . Vitals . Screenings to include cognitive, depression, and falls . Referrals and appointments  In addition, I have reviewed and discussed with patient certain preventive protocols, quality metrics, and best practice recommendations. A written personalized care plan for preventive services as well as general preventive health recommendations were provided to patient.     Shela Nevin, [place]  12/30/2019

## 2019-12-30 ENCOUNTER — Ambulatory Visit (INDEPENDENT_AMBULATORY_CARE_PROVIDER_SITE_OTHER): Payer: Medicare HMO | Admitting: *Deleted

## 2019-12-30 ENCOUNTER — Other Ambulatory Visit: Payer: Self-pay

## 2019-12-30 ENCOUNTER — Encounter: Payer: Self-pay | Admitting: *Deleted

## 2019-12-30 DIAGNOSIS — Z0000 Encounter for general adult medical examination without abnormal findings: Secondary | ICD-10-CM

## 2019-12-30 NOTE — Patient Instructions (Signed)
Please schedule your next medicare wellness visit with me in 1 yr.  Continue to eat heart healthy diet (full of fruits, vegetables, whole grains, lean protein, water--limit salt, fat, and sugar intake) and increase physical activity as tolerated.  Bring a copy of your living will and/or healthcare power of attorney to your next office visit.   Kelly Dixon , Thank you for taking time to come for your Medicare Wellness Visit. I appreciate your ongoing commitment to your health goals. Please review the following plan we discussed and let me know if I can assist you in the future.   These are the goals we discussed: Goals    . Increase physical activity       This is a list of the screening recommended for you and due dates:  Health Maintenance  Topic Date Due  . Pneumonia vaccines (2 of 2 - PPSV23) 05/28/2019  . Flu Shot  04/30/2020  . Mammogram  07/27/2021  . Tetanus Vaccine  05/27/2028  . Colon Cancer Screening  06/29/2028  . DEXA scan (bone density measurement)  Completed  .  Hepatitis C: One time screening is recommended by Center for Disease Control  (CDC) for  adults born from [birth year] through 1963.   Completed    Preventive Care 66 Years and Older, Female Preventive care refers to lifestyle choices and visits with your health care provider that can promote health and wellness. This includes:  A yearly physical exam. This is also called an annual well check.  Regular dental and eye exams.  Immunizations.  Screening for certain conditions.  Healthy lifestyle choices, such as diet and exercise. What can I expect for my preventive care visit? Physical exam Your health care provider will check:  Height and weight. These may be used to calculate body mass index (BMI), which is a measurement that tells if you are at a healthy weight.  Heart rate and blood pressure.  Your skin for abnormal spots. Counseling Your health care provider may ask you questions about:  Alcohol,  tobacco, and drug use.  Emotional well-being.  Home and relationship well-being.  Sexual activity.  Eating habits.  History of falls.  Memory and ability to understand (cognition).  Work and work Statistician.  Pregnancy and menstrual history. What immunizations do I need?  Influenza (flu) vaccine  This is recommended every year. Tetanus, diphtheria, and pertussis (Tdap) vaccine  You may need a Td booster every 10 years. Varicella (chickenpox) vaccine  You may need this vaccine if you have not already been vaccinated. Zoster (shingles) vaccine  You may need this after age 62. Pneumococcal conjugate (PCV13) vaccine  One dose is recommended after age 39. Pneumococcal polysaccharide (PPSV23) vaccine  One dose is recommended after age 28. Measles, mumps, and rubella (MMR) vaccine  You may need at least one dose of MMR if you were born in 1957 or later. You may also need a second dose. Meningococcal conjugate (MenACWY) vaccine  You may need this if you have certain conditions. Hepatitis A vaccine  You may need this if you have certain conditions or if you travel or work in places where you may be exposed to hepatitis A. Hepatitis B vaccine  You may need this if you have certain conditions or if you travel or work in places where you may be exposed to hepatitis B. Haemophilus influenzae type b (Hib) vaccine  You may need this if you have certain conditions. You may receive vaccines as individual doses or as more  than one vaccine together in one shot (combination vaccines). Talk with your health care provider about the risks and benefits of combination vaccines. What tests do I need? Blood tests  Lipid and cholesterol levels. These may be checked every 5 years, or more frequently depending on your overall health.  Hepatitis C test.  Hepatitis B test. Screening  Lung cancer screening. You may have this screening every year starting at age 63 if you have a  30-pack-year history of smoking and currently smoke or have quit within the past 15 years.  Colorectal cancer screening. All adults should have this screening starting at age 72 and continuing until age 63. Your health care provider may recommend screening at age 81 if you are at increased risk. You will have tests every 1-10 years, depending on your results and the type of screening test.  Diabetes screening. This is done by checking your blood sugar (glucose) after you have not eaten for a while (fasting). You may have this done every 1-3 years.  Mammogram. This may be done every 1-2 years. Talk with your health care provider about how often you should have regular mammograms.  BRCA-related cancer screening. This may be done if you have a family history of breast, ovarian, tubal, or peritoneal cancers. Other tests  Sexually transmitted disease (STD) testing.  Bone density scan. This is done to screen for osteoporosis. You may have this done starting at age 98. Follow these instructions at home: Eating and drinking  Eat a diet that includes fresh fruits and vegetables, whole grains, lean protein, and low-fat dairy products. Limit your intake of foods with high amounts of sugar, saturated fats, and salt.  Take vitamin and mineral supplements as recommended by your health care provider.  Do not drink alcohol if your health care provider tells you not to drink.  If you drink alcohol: ? Limit how much you have to 0-1 drink a day. ? Be aware of how much alcohol is in your drink. In the [place] one drink equals one 12 oz bottle of beer (355 mL), one 5 oz glass of wine (148 mL), or one 1 oz glass of hard liquor (44 mL). Lifestyle  Take daily care of your teeth and gums.  Stay active. Exercise for at least 30 minutes on 5 or more days each week.  Do not use any products that contain nicotine or tobacco, such as cigarettes, e-cigarettes, and chewing tobacco. If you need help quitting, ask your  health care provider.  If you are sexually active, practice safe sex. Use a condom or other form of protection in order to prevent STIs (sexually transmitted infections).  Talk with your health care provider about taking a low-dose aspirin or statin. What's next?  Go to your health care provider once a year for a well check visit.  Ask your health care provider how often you should have your eyes and teeth checked.  Stay up to date on all vaccines. This information is not intended to replace advice given to you by your health care provider. Make sure you discuss any questions you have with your health care provider. Document Revised: 09/10/2018 Document Reviewed: 09/10/2018 Elsevier Patient Education  2020 [employer].

## 2020-01-19 ENCOUNTER — Encounter: Payer: Medicare HMO | Admitting: Adult Health

## 2020-02-10 ENCOUNTER — Inpatient Hospital Stay: Payer: Medicare HMO | Attending: Adult Health | Admitting: Adult Health

## 2020-02-10 ENCOUNTER — Other Ambulatory Visit: Payer: Self-pay | Admitting: Adult Health

## 2020-02-10 DIAGNOSIS — Z170 Estrogen receptor positive status [ER+]: Secondary | ICD-10-CM | POA: Insufficient documentation

## 2020-02-10 DIAGNOSIS — C50411 Malignant neoplasm of upper-outer quadrant of right female breast: Secondary | ICD-10-CM | POA: Diagnosis not present

## 2020-02-10 DIAGNOSIS — E2839 Other primary ovarian failure: Secondary | ICD-10-CM

## 2020-02-10 MED ORDER — LETROZOLE 2.5 MG PO TABS: 2.5000 mg | ORAL_TABLET | Freq: Every day | ORAL | 3 refills | Status: DC

## 2020-02-10 NOTE — Progress Notes (Signed)
SURVIVORSHIP VIRTUAL VISIT:  I connected with Kelly Dixon on 02/10/20 at 10:30 AM EDT by my chart video and verified that I am speaking with the correct person using two identifiers.  I discussed the limitations, risks, security and privacy concerns of performing an evaluation and management service by telephone and the availability of in person appointments. I also discussed with the patient that there may be a patient responsible charge related to this service. The patient expressed understanding and agreed to proceed.   BRIEF ONCOLOGIC HISTORY:  Oncology History  Malignant neoplasm of upper-outer quadrant of right breast in female, estrogen receptor positive ([place])  09/08/2019 Initial Diagnosis   Routine screening mammogram detected a 0.8cm mass at the 10 o'clock position in the right breast, no right axillary adenopathy. Biopsy showed IDC with DCIS, grade 2, HER-2 - (1+), ER+ 100%, PR 70%, Ki67 15%.    09/08/2019 Cancer Staging   Staging form: Breast, AJCC 8th Edition - Clinical: Stage IA (cT1b, cN0, cM0, G2, ER+, PR+, HER2-)    09/15/2019 Surgery   Right lumpectomy ([place]) ([id number]): IDC with DCIS, 0.9cm, clear margins, 3 right axillary lymph nodes negative.    09/15/2019 Cancer Staging   Staging form: Breast, AJCC 8th Edition - Pathologic stage from 09/15/2019: Stage IA (pT1b, pN0, cM0, G2, ER+, PR+, HER2-)    10/2019 - 09/2024 Anti-estrogen oral therapy   Anastrozole daily     INTERVAL HISTORY:  [name] to review her survivorship care plan detailing her treatment course for breast cancer, as well as monitoring long-term side effects of that treatment, education regarding health maintenance, screening, and overall wellness and health promotion.     Overall, [name] reports feeling moderately well.  She completed survivorship survey.  She is taking Anastrozole daily and is having significant arthralgias.  She is wanting to know if she could change to something else instead  of anastrozole.  She also notes fatigue, and isn't exercising (secondary to arthralgias) as well as some financial concerns.  She has no other issues today.  REVIEW OF SYSTEMS:  Review of Systems  Constitutional: Negative for appetite change, chills, fatigue, fever and unexpected weight change.  HENT:   Negative for hearing loss, lump/mass, sore throat and trouble swallowing.   Eyes: Negative for eye problems and icterus.  Respiratory: Negative for chest tightness, cough and shortness of breath.   Cardiovascular: Negative for chest pain, leg swelling and palpitations.  Gastrointestinal: Negative for abdominal distention, abdominal pain, constipation, diarrhea, nausea and vomiting.  Endocrine: Negative for hot flashes.  Genitourinary: Negative for difficulty urinating.   Musculoskeletal: Positive for arthralgias.  Skin: Negative for itching and rash.  Neurological: Negative for dizziness, extremity weakness, headaches and numbness.  Hematological: Negative for adenopathy. Does not bruise/bleed easily.  Psychiatric/Behavioral: Negative for depression. The patient is not nervous/anxious.    Breast: Denies any new nodularity, masses, tenderness, nipple changes, or nipple discharge.      ONCOLOGY TREATMENT TEAM:  1. Surgeon:  Dr. Georgette Dover at [facility] 2. Medical Oncologist: Dr. Lindi Adie  3. Radiation Oncologist: Dr. Isidore Moos    PAST MEDICAL/SURGICAL HISTORY:  Past Medical History:  Diagnosis Date  . Arthritis   . Gallstones 05/31/2018  . GERD (gastroesophageal reflux disease)   . PONV (postoperative nausea and vomiting)   . Syphilis    Past Surgical History:  Procedure Laterality Date  . BREAST LUMPECTOMY WITH RADIOACTIVE SEED AND SENTINEL LYMPH NODE BIOPSY Right 09/15/2019   Procedure: RIGHT BREAST LUMPECTOMY WITH RADIOACTIVE SEED AND SENTINEL  LYMPH NODE BIOPSY;  Surgeon: Donnie Mesa, MD;  Location: [facility];  Service: General;  Laterality: Right;  .  CHOLECYSTECTOMY N/A 04/13/2019   Procedure: LAPAROSCOPIC CHOLECYSTECTOMY WITH INTRAOPERATIVE CHOLANGIOGRAM;  Surgeon: Donnie Mesa, MD;  Location: [place];  Service: General;  Laterality: N/A;     ALLERGIES:  No Known Allergies   CURRENT MEDICATIONS:  Outpatient Encounter Medications as of 02/10/2020  Medication Sig  . anastrozole (ARIMIDEX) 1 MG tablet Take 1 tablet (1 mg total) by mouth daily.  . calcium-vitamin D (OSCAL WITH D) 500-200 MG-UNIT tablet Take 1 tablet by mouth 2 (two) times daily.  Marland Kitchen loratadine (CLARITIN) 10 MG tablet Take 10 mg by mouth daily.  . meloxicam (MOBIC) 7.5 MG tablet Take 1 tablet (7.5 mg total) by mouth daily.   No facility-administered encounter medications on file as of 02/10/2020.     ONCOLOGIC FAMILY HISTORY:  Family History  Problem Relation Age of Onset  . Arthritis Mother   . Hearing loss Mother   . Heart disease Father   . Arthritis Sister      GENETIC COUNSELING/TESTING: Not at this time  SOCIAL HISTORY:  Social History   Socioeconomic History  . Marital status: Divorced    Spouse name: Not on file  . Number of children: Not on file  . Years of education: Not on file  . Highest education level: Not on file  Occupational History  . Not on file  Tobacco Use  . Smoking status: Former Smoker    Packs/day: 0.50    Quit date: 11/29/2019    Years since quitting: 0.2  . Smokeless tobacco: Never Used  Substance and Sexual Activity  . Alcohol use: Yes    Comment: rarely  . Drug use: Never  . Sexual activity: Not on file  Other Topics Concern  . Not on file  Social History Narrative  . Not on file   Social Determinants of Health   Financial Resource Strain: Low Risk   . Difficulty of Paying Living Expenses: Not hard at all  Food Insecurity: No Food Insecurity  . Worried About Charity fundraiser in the Last Year: Never true  . Ran Out of Food in the Last Year: Never true  Transportation Needs: No Transportation Needs  . Lack  of Transportation (Medical): No  . Lack of Transportation (Non-Medical): No  Physical Activity:   . Days of Exercise per Week:   . Minutes of Exercise per Session:   Stress:   . Feeling of Stress :   Social Connections:   . Frequency of Communication with Friends and Family:   . Frequency of Social Gatherings with Friends and Family:   . Attends Religious Services:   . Active Member of Clubs or Organizations:   . Attends Archivist Meetings:   Marland Kitchen Marital Status:   Intimate Partner Violence:   . Fear of Current or Ex-Partner:   . Emotionally Abused:   Marland Kitchen Physically Abused:   . Sexually Abused:      OBSERVATIONS/OBJECTIVE:  Patient appears well and is in no apparent distress.  Her breathing is non labored.  Skin visualized without rash or lesion.    LABORATORY DATA:  None for this visit.  DIAGNOSTIC IMAGING:  None for this visit.      ASSESSMENT AND PLAN:  Ms.. Trampe is a pleasant 74 y.o. female with Stage IA right breast invasive ductal carcinoma, ER+/PR+/HER2-, diagnosed in 08/2019, treated with lumpectomy, and anti-estrogen therapy with Anastrozole  beginning in 10/2019.  She presents to the Survivorship Clinic for our initial meeting and routine follow-up post-completion of treatment for breast cancer.    1. Stage IA right breast cancer:  Ms. Hutt is continuing to recover from definitive treatment for breast cancer. She will follow-up with her medical oncologist, Dr. Lindi Adie in 07/2020 with history and physical exam per surveillance protocol.   Her mammogram is due 06/2020; orders placed today. Today, a comprehensive survivorship care plan and treatment summary was reviewed with the patient today detailing her breast cancer diagnosis, treatment course, potential late/long-term effects of treatment, appropriate follow-up care with recommendations for the future, and patient education resources.  A copy of this summary, along with a letter will be sent to the patient's  primary care provider via mail/fax/In Basket message after today's visit.    2. Arthralgias: She will stop taking Anastrozole x 2 weeks.  She will then change to letrozole.  I reviewed all of the anti estrogen therapies with her today and that we will try each one to see if there is one she tolerates better than the other.  3. Positive Distress screen: QOLCSV completed with patient.  She does note financial concerns and sent to social work.    4. Bone health:  Given Ms. Blust age/history of breast cancer and her current treatment regimen including anti-estrogen therapy with Anastrozole, she is at risk for bone demineralization.  She notes that she hasn't undergone bone density testing, therefore, I will order this for her. She was given education on specific activities to promote bone health.  5. Cancer screening:  Due to Ms. Wrisley's history and her age, she should receive screening for skin cancers, colon cancer, and gynecologic cancers.  The information and recommendations are listed on the patient's comprehensive care plan/treatment summary and were reviewed in detail with the patient.    6. Health maintenance and wellness promotion: Ms. Sansone was encouraged to consume 5-7 servings of fruits and vegetables per day. We reviewed the "Nutrition Rainbow" handout, as well as the handout "Take Control of Your Health and Reduce Your Cancer Risk" from the [employer].  She was also encouraged to engage in moderate to vigorous exercise for 30 minutes per day most days of the week. We discussed the [employer] fitness program, which is designed for cancer survivors to help them become more physically fit after cancer treatments.  She was instructed to limit her alcohol consumption and continue to abstain from tobacco use.      7. Support services/counseling: It is not uncommon for this period of the patient's cancer care trajectory to be one of many emotions and stressors.  We discussed how  this can be increasingly difficult during the times of quarantine and social distancing due to the COVID-19 pandemic.   She was given information regarding our available services and encouraged to contact me with any questions or for help enrolling in any of our support group/programs.    Follow up instructions:    -Return to cancer center in 07/2020 for f/u with Dr. Lindi Adie  -Mammogram due in 06/2020 -Follow up with surgery 01/2021 -She is welcome to return back to the Survivorship Clinic at any time; no additional follow-up needed at this time.  -Consider referral back to survivorship as a long-term survivor for continued surveillance  The patient was provided an opportunity to ask questions and all were answered. The patient agreed with the plan and demonstrated an understanding of the instructions.   The  patient was advised to call back or seek an in-person evaluation if the symptoms worsen or if the condition fails to improve as anticipated.   I provided 45 minutes of face-to-face video visit time during this encounter, and > 50% was spent counseling as documented under my assessment & plan.  Scot Dock, NP

## 2020-02-14 ENCOUNTER — Telehealth: Payer: Self-pay | Admitting: Hematology and Oncology

## 2020-02-14 NOTE — Telephone Encounter (Signed)
Scheduled appts per 5/13 los. Pt confirmed appt date and time.

## 2020-02-15 ENCOUNTER — Encounter: Payer: Self-pay | Admitting: Licensed Clinical Social Worker

## 2020-02-15 NOTE — Progress Notes (Signed)
[facility] Quality of Life Screening Clinical Social Work  Clinical Social Work was referred by survivorship quality of life screening protocol.  The patient scored a 49.1 on the Quality of Life/ Cancer Survivor (QOL-CS) scale which indicates high quality of life.   Clinical Social Worker contacted patient by phone to assess for needs. Based on screener and patient report, financial distress is the main concern. Patient lives on social security and was making some extra money doing [employer]. Will have bills coming in from her rbeast cancer treatment as well as gall bladder surgery and is worried about that. Discussed resources through [facility] as well as potentially applying for [employer]. Information mailed to patient per her request. She is also interviewing for a work-from-home job that she hopes to get.    Follow up plan: 1. Patient to decide if she would like to apply for [employer] 2. Follow-up QOL screen to be sent in 1 months.     Braeden Dolinski E, LCSW

## 2020-04-12 ENCOUNTER — Other Ambulatory Visit: Payer: Self-pay

## 2020-04-12 ENCOUNTER — Ambulatory Visit (INDEPENDENT_AMBULATORY_CARE_PROVIDER_SITE_OTHER)
Admission: RE | Admit: 2020-04-12 | Discharge: 2020-04-12 | Disposition: A | Discharge status: Home / Self Care | Payer: Medicare HMO | Source: Ambulatory Visit

## 2020-04-12 DIAGNOSIS — R110 Nausea: Secondary | ICD-10-CM | POA: Diagnosis not present

## 2020-04-12 DIAGNOSIS — R197 Diarrhea, unspecified: Secondary | ICD-10-CM

## 2020-04-12 DIAGNOSIS — Z853 Personal history of malignant neoplasm of breast: Secondary | ICD-10-CM

## 2020-04-12 MED ORDER — ONDANSETRON 8 MG PO TBDP
8.0000 mg | ORAL_TABLET | Freq: Three times a day (TID) | ORAL | 0 refills | Status: DC | PRN
Start: 2020-04-12 — End: 2020-08-01

## 2020-04-12 NOTE — ED Provider Notes (Signed)
Virtual Visit via Video Note:  Kelly Dixon  initiated request for Telemedicine visit with [facility] team. I connected with Kelly Dixon  on 04/12/2020 at 1:12 PM  for a synchronized telemedicine visit using a video enabled HIPPA compliant telemedicine application. I verified that I am speaking with Kelly Dixon  using two identifiers. Kelly Eagles, PA-C  was physically located in a [facility]gent care site and Kelly Dixon was located at a different location.   The limitations of evaluation and management by telemedicine as well as the availability of in-person appointments were discussed. Patient was informed that she  may incur a bill ( including co-pay) for this virtual visit encounter. Kelly Dixon  expressed understanding and gave verbal consent to proceed with virtual visit.     History of Present Illness:Kelly Dixon  is a 74 y.o. female presents with 3 day hx of acute onset of moderate generalized abdominal pain, diarrhea (several episodes Monday night), nausea, fatigue.  Has tried Pepto Bismol, heartburn medications. She is trying to hydrate consistently. Has not traveled recently. No dizziness. Has recently tried to eat a lot healthier as she is concerned she's had weight gain.  Denies bloody stools, fever, chest pain, vomiting.  Patient has a history of breast cancer, had a lumpectomy last year and is currently on letrozole for adjunctive therapy.   ROS   No current facility-administered medications for this encounter.   Current Outpatient Medications  Medication Sig Dispense Refill  . calcium-vitamin D (OSCAL WITH D) 500-200 MG-UNIT tablet Take 1 tablet by mouth 2 (two) times daily. 180 tablet 3  . letrozole (FEMARA) 2.5 MG tablet Take 1 tablet (2.5 mg total) by mouth daily. 90 tablet 3  . loratadine (CLARITIN) 10 MG tablet Take 10 mg by mouth daily.    . meloxicam (MOBIC) 7.5 MG tablet Take 1 tablet (7.5 mg total) by mouth daily. 90 tablet 0     No Known  Allergies   Past Medical History:  Diagnosis Date  . Arthritis   . Gallstones 05/31/2018  . GERD (gastroesophageal reflux disease)   . PONV (postoperative nausea and vomiting)   . Syphilis     Past Surgical History:  Procedure Laterality Date  . BREAST LUMPECTOMY WITH RADIOACTIVE SEED AND SENTINEL LYMPH NODE BIOPSY Right 09/15/2019   Procedure: RIGHT BREAST LUMPECTOMY WITH RADIOACTIVE SEED AND SENTINEL LYMPH NODE BIOPSY;  Surgeon: Donnie Mesa, MD;  Location: [place];  Service: General;  Laterality: Right;  . CHOLECYSTECTOMY N/A 04/13/2019   Procedure: LAPAROSCOPIC CHOLECYSTECTOMY WITH INTRAOPERATIVE CHOLANGIOGRAM;  Surgeon: Donnie Mesa, MD;  Location: [place];  Service: General;  Laterality: N/A;      Observations/Objective: Physical Exam Constitutional:      General: She is not in acute distress.    Appearance: Normal appearance. She is well-developed. She is not ill-appearing, toxic-appearing or diaphoretic.  Eyes:     Extraocular Movements: Extraocular movements intact.  Pulmonary:     Effort: Pulmonary effort is normal.  Neurological:     General: No focal deficit present.     Mental Status: She is alert and oriented to person, place, and time.  Psychiatric:        Mood and Affect: Mood normal.        Behavior: Behavior normal.        Thought Content: Thought content normal.        Judgment: Judgment normal.     Assessment and Plan:  PDMP not reviewed this encounter.  1. Nausea without vomiting   2. Diarrhea, unspecified type   3. History of breast cancer     Recommended patient stop using naproxen.  Use supportive care for suspected gastroenteritis, colitis.  Prescription for Zofran sent to the pharmacy.  Use Tylenol for fevers, aches or pains. Counseled patient on potential for adverse effects with medications prescribed/recommended today, ER and return-to-clinic precautions discussed, patient verbalized understanding.   Follow Up  Instructions:    I discussed the assessment and treatment plan with the patient. The patient was provided an opportunity to ask questions and all were answered. The patient agreed with the plan and demonstrated an understanding of the instructions.   The patient was advised to call back or seek an in-person evaluation if the symptoms worsen or if the condition fails to improve as anticipated.  I provided 15 minutes of non-face-to-face time during this encounter.    Kelly Eagles, PA-C  04/12/2020 1:12 PM         Kelly Eagles, PA-C 04/12/20 1325

## 2020-04-12 NOTE — Discharge Instructions (Addendum)
Make sure you push fluids drinking mostly water but mix it with Gatorade.  Try to eat light meals including soups, broths and soft foods, fruits.  You may use Zofran for your nausea and vomiting once every 8 hours.  Please return to the clinic if symptoms worsen or you start having severe abdominal pain not helped by taking Tylenol or start having bloody stools or blood in the vomit.  Please stop using naproxen as this can cause stomach irritation.  Switch to Tylenol for fevers aches and pains.  Tylenol at a dose of 500 mg to 650 mg once every 6 hours is okay.  If you develop worsening belly pain, vomiting, bloody diarrhea, chest pain then please come into the clinic for an in person evaluation.

## 2020-04-18 ENCOUNTER — Ambulatory Visit: Payer: Medicare HMO | Admitting: Family Medicine

## 2020-05-31 ENCOUNTER — Encounter: Payer: Medicare HMO | Admitting: Family Medicine

## 2020-08-01 ENCOUNTER — Ambulatory Visit
Admission: RE | Admit: 2020-08-01 | Discharge: 2020-08-01 | Disposition: A | Discharge status: Home / Self Care | Payer: Medicare HMO | Source: Ambulatory Visit | Attending: Adult Health | Admitting: Adult Health

## 2020-08-01 ENCOUNTER — Encounter: Payer: Self-pay | Admitting: Family Medicine

## 2020-08-01 ENCOUNTER — Ambulatory Visit (INDEPENDENT_AMBULATORY_CARE_PROVIDER_SITE_OTHER): Payer: Medicare HMO | Admitting: Family Medicine

## 2020-08-01 ENCOUNTER — Other Ambulatory Visit: Payer: Self-pay

## 2020-08-01 VITALS — BP 154/94 | HR 79 | Temp 98.0°F | Ht 62.0 in | Wt 217.2 lb

## 2020-08-01 DIAGNOSIS — K1370 Unspecified lesions of oral mucosa: Secondary | ICD-10-CM | POA: Diagnosis not present

## 2020-08-01 DIAGNOSIS — C50411 Malignant neoplasm of upper-outer quadrant of right female breast: Secondary | ICD-10-CM

## 2020-08-01 DIAGNOSIS — Z170 Estrogen receptor positive status [ER+]: Secondary | ICD-10-CM

## 2020-08-01 DIAGNOSIS — M85832 Other specified disorders of bone density and structure, left forearm: Secondary | ICD-10-CM | POA: Diagnosis not present

## 2020-08-01 DIAGNOSIS — R030 Elevated blood-pressure reading, without diagnosis of hypertension: Secondary | ICD-10-CM | POA: Diagnosis not present

## 2020-08-01 DIAGNOSIS — M85851 Other specified disorders of bone density and structure, right thigh: Secondary | ICD-10-CM | POA: Diagnosis not present

## 2020-08-01 DIAGNOSIS — Z780 Asymptomatic menopausal state: Secondary | ICD-10-CM | POA: Diagnosis not present

## 2020-08-01 NOTE — Progress Notes (Signed)
Chief Complaint  Patient presents with  . Brown spots in her mouth    Kelly Dixon is a 74 y.o. female here for a skin complaint.  Duration: many years She was told to get it checked out by dentist to was minimally concerned Location: mouth Pruritic? No Painful? No Drainage? No New soaps/lotions/topicals/detergents? No Sick contacts? No Other associated symptoms: Started after getting Novocaine injections Therapies tried thus far: none  Hypertension Patient presents for high blood pressure follow up. She does not monitor home blood pressures. She is not on medications. She is not adhering to a healthy diet overall. Exercise: none  Past Medical History:  Diagnosis Date  . Arthritis   . Gallstones 05/31/2018  . GERD (gastroesophageal reflux disease)   . PONV (postoperative nausea and vomiting)   . Syphilis     BP (!) 154/94 (BP Location: Left Arm, Patient Position: Sitting, Cuff Size: Large)   Pulse 79   Temp 98 F (36.7 C) (Oral)   Ht 5\' 2"  (1.575 m)   Wt 217 lb 4 oz (98.5 kg)   SpO2 99%   BMI 39.74 kg/m  Gen: awake, alert, appearing stated age Lungs: No accessory muscle use Heart: RRR, no LE edema Mouth: MMM, flat and hyperpigmented lesions on the roof of her mouth in addition to her lip; there is no excoriation or erythema.  No drainage. Psych: Age appropriate judgment and insight  Elevated blood pressure reading  Oral mucosal lesion  1.  Counseled on diet and exercise.  Recommended she get a home blood pressure monitor and check blood pressure at home.  She will bring both readings and monitor to her appointment in 1 month where we will recheck.  She will likely need to start a medication, I want her blood pressure less than 150/90 consistently. 2.  Reassurance. The patient voiced understanding and agreement to the plan.  [place], DO 08/01/20 2:33 PM

## 2020-08-01 NOTE — Patient Instructions (Signed)
Keep the diet clean and stay active.  I would not do anything with the spots in your mouth.  Around 3 times per week, check your blood pressure 4 times per day. Twice in the morning and twice in the evening. The readings should be at least one minute apart. Write down these values and bring them to your next nurse visit/appointment.  When you check your BP, make sure you have been doing something calm/relaxing 5 minutes prior to checking. Both feet should be flat on the floor and you should be sitting. Use your left arm and make sure it is in a relaxed position (on a table), and that the cuff is at the approximate level/height of your heart.  Let us know if you need anything.

## 2020-08-02 ENCOUNTER — Telehealth: Payer: Self-pay

## 2020-08-02 NOTE — Telephone Encounter (Signed)
Called pt w/ bone density results per Wilber Bihari, NP. Educated pt on weight bearing exercises. Pt verbalized thanks and understanding and also understands to call with any concerns.

## 2020-08-02 NOTE — Telephone Encounter (Signed)
-----   Message from Gardenia Phlegm, NP sent at 08/02/2020  8:08 AM EDT ----- Mild osteopenia.  Please notify patient ----- Message ----- From: Interface, Rad Results In Sent: 08/01/2020   8:52 AM EDT To: Gardenia Phlegm, NP

## 2020-08-09 ENCOUNTER — Ambulatory Visit: Payer: Medicare HMO | Admitting: Family Medicine

## 2020-08-16 ENCOUNTER — Inpatient Hospital Stay: Payer: Medicare HMO | Admitting: Hematology and Oncology

## 2020-08-31 ENCOUNTER — Ambulatory Visit: Payer: Medicare HMO | Admitting: Family Medicine

## 2020-09-04 ENCOUNTER — Other Ambulatory Visit: Payer: Self-pay

## 2020-09-04 ENCOUNTER — Ambulatory Visit (INDEPENDENT_AMBULATORY_CARE_PROVIDER_SITE_OTHER): Payer: Medicare HMO | Admitting: Family Medicine

## 2020-09-04 ENCOUNTER — Encounter: Payer: Self-pay | Admitting: Family Medicine

## 2020-09-04 VITALS — BP 142/90 | HR 80 | Temp 98.7°F | Ht 62.0 in | Wt 214.2 lb

## 2020-09-04 DIAGNOSIS — I10 Essential (primary) hypertension: Secondary | ICD-10-CM

## 2020-09-04 MED ORDER — AMLODIPINE BESYLATE 5 MG PO TABS: 5.0000 mg | ORAL_TABLET | Freq: Every day | ORAL | 3 refills | Status: DC

## 2020-09-04 NOTE — Patient Instructions (Signed)
Keep the diet clean and stay active.  Continue to monitor your blood pressure at home.  Your goal blood pressure is less than 150/90 consistently.   Let us know if you need anything.

## 2020-09-04 NOTE — Progress Notes (Signed)
Chief Complaint  Patient presents with  . Follow-up    Subjective Kelly Dixon is a 74 y.o. female who presents for hypertension follow up. She does monitor home blood pressures. Blood pressures ranging from 130-150's/70-90's on average. She is not currently on any medications.  She is getting better with her diet. Current exercise: swimming No famhx of HTN. Does not think she snores at night. Does not wake up fatigued or wake up gasping for air.    Past Medical History:  Diagnosis Date  . Arthritis   . Gallstones 05/31/2018  . GERD (gastroesophageal reflux disease)   . PONV (postoperative nausea and vomiting)   . Syphilis     Exam BP (!) 142/90 (BP Location: Left Arm, Patient Position: Sitting, Cuff Size: Normal)   Pulse 80   Temp 98.7 F (37.1 C) (Oral)   Ht 5\' 2"  (1.575 m)   Wt 214 lb 4 oz (97.2 kg)   SpO2 97%   BMI 39.19 kg/m  General:  well developed, well nourished, in no apparent distress Heart: RRR, no bruits, no LE edema Lungs: clear to auscultation, no accessory muscle use Psych: well oriented with normal range of affect and appropriate judgment/insight  Essential hypertension - Plan: amLODipine (NORVASC) 5 MG tablet  Status: Chronic, uncontrolled. Start Norvasc 5 mg/d. Cont to monitor at home. Counseled on diet and exercise. Pt brought her monitor to the office and checked while I was present. She had good technique, BP monitor consistent to the reading we obtained.  F/u in 1 mo. The patient voiced understanding and agreement to the plan.  [place], DO 09/04/20  9:47 AM

## 2020-09-13 ENCOUNTER — Ambulatory Visit
Admission: RE | Admit: 2020-09-13 | Discharge: 2020-09-13 | Disposition: A | Discharge status: Home / Self Care | Payer: Medicare HMO | Source: Ambulatory Visit | Attending: Adult Health | Admitting: Adult Health

## 2020-09-13 ENCOUNTER — Other Ambulatory Visit: Payer: Self-pay

## 2020-09-13 DIAGNOSIS — Z853 Personal history of malignant neoplasm of breast: Secondary | ICD-10-CM | POA: Diagnosis not present

## 2020-09-13 DIAGNOSIS — R928 Other abnormal and inconclusive findings on diagnostic imaging of breast: Secondary | ICD-10-CM | POA: Diagnosis not present

## 2020-09-17 NOTE — Progress Notes (Signed)
Patient Care Team: Shelda Pal, DO as PCP - General (Family Medicine) Donnie Mesa, MD as Consulting Physician (General Surgery) Nicholas Lose, MD as Consulting Physician (Hematology and Oncology) Eppie Gibson, MD as Attending Physician (Radiation Oncology)  DIAGNOSIS:    ICD-10-CM   1. Malignant neoplasm of upper-outer quadrant of right breast in female, estrogen receptor positive ([place])  C50.411    Z17.0     SUMMARY OF ONCOLOGIC HISTORY: Oncology History  Malignant neoplasm of upper-outer quadrant of right breast in female, estrogen receptor positive ([place])  09/08/2019 Initial Diagnosis   Routine screening mammogram detected a 0.8cm mass at the 10 o'clock position in the right breast, no right axillary adenopathy. Biopsy showed IDC with DCIS, grade 2, HER-2 - (1+), ER+ 100%, PR 70%, Ki67 15%.    09/08/2019 Cancer Staging   Staging form: Breast, AJCC 8th Edition - Clinical: Stage IA (cT1b, cN0, cM0, G2, ER+, PR+, HER2-)    09/15/2019 Surgery   Right lumpectomy ([facility]) ([id number]): IDC with DCIS, 0.9cm, clear margins, 3 right axillary lymph nodes negative.    09/15/2019 Cancer Staging   Staging form: Breast, AJCC 8th Edition - Pathologic stage from 09/15/2019: Stage IA (pT1b, pN0, cM0, G2, ER+, PR+, HER2-)    10/2019 - 09/2024 Anti-estrogen oral therapy   Anastrozole daily     CHIEF COMPLIANT: Follow-up of right breast cancer on anastrozole  INTERVAL HISTORY: Kelly Dixon is a 74 y.o. with above-mentioned history of right breast cancer who underwent a lumpectomy and is currently on antiestrogen therapy with anastrozole. Bone density scan on 08/01/20 showed osteopenia with a T-score of -1.5 at the forearm radius. Mammogram on 09/13/20 showed no evidence of malignancy bilaterally. She presents to the clinic today for follow-up.  She is tolerating anastrozole extremely well without any problems or concerns.  Denies symptoms or nodules in the breast.  Her major  complaint is related to her knees which are bothering her significantly.  ALLERGIES:  has No Known Allergies.  MEDICATIONS:  Current Outpatient Medications  Medication Sig Dispense Refill  . amLODipine (NORVASC) 5 MG tablet Take 1 tablet (5 mg total) by mouth daily. 30 tablet 3  . calcium-vitamin D (OSCAL WITH D) 500-200 MG-UNIT tablet Take 1 tablet by mouth 2 (two) times daily. 180 tablet 3  . letrozole (FEMARA) 2.5 MG tablet Take 1 tablet (2.5 mg total) by mouth daily. 90 tablet 3  . loratadine (CLARITIN) 10 MG tablet Take 10 mg by mouth daily.    . meloxicam (MOBIC) 7.5 MG tablet Take 1 tablet (7.5 mg total) by mouth daily. 90 tablet 0   No current facility-administered medications for this visit.    PHYSICAL EXAMINATION: ECOG PERFORMANCE STATUS: 1 - Symptomatic but completely ambulatory  Vitals:   09/18/20 0828  BP: (!) 130/59  Pulse: 68  Resp: 18  Temp: 97.6 F (36.4 C)  SpO2: 100%   Filed Weights   09/18/20 0828  Weight: 210 lb 14.4 oz (95.7 kg)    BREAST: No palpable masses or nodules in either right or left breasts. No palpable axillary supraclavicular or infraclavicular adenopathy no breast tenderness or nipple discharge. (exam performed in the presence of a chaperone)  LABORATORY DATA:  I have reviewed the data as listed CMP Latest Ref Rng & Units 07/22/2019 04/06/2019 05/27/2018  Glucose 65 - 99 mg/dL 101(H) 111(H) 83  BUN 7 - 25 mg/dL 13 7(L) 14  Creatinine 0.60 - 0.93 mg/dL 0.79 0.75 0.79  Sodium 135 - 146 mmol/L 142  142 145  Potassium 3.5 - 5.3 mmol/L 4.3 4.0 4.7  Chloride 98 - 110 mmol/L 106 110 108  CO2 20 - 32 mmol/L _0 Calcium 8.6 - 10.4 mg/dL 9.2 8.7(L) 9.2  Total Protein 6.1 - 8.1 g/dL 6.9 - 6.7  Total Bilirubin 0.2 - 1.2 mg/dL 0.6 - 0.4  AST 10 - 35 U/L 13 - 11  ALT 6 - 29 U/L 9 - 6    Lab Results  Component Value Date   WBC 4.1 07/22/2019   HGB 13.1 07/22/2019   HCT 38.8 07/22/2019   MCV 89.6 07/22/2019   PLT 214 07/22/2019     ASSESSMENT & PLAN:  Malignant neoplasm of upper-outer quadrant of right breast in female, estrogen receptor positive ([place]) 09/15/2019:Right lumpectomy ([facility]): IDC with DCIS, 0.9cm, clear margins, 3 right axillary lymph nodes negative.  ER 100%, PR 70%, Ki-67 15%, HER-2 negative, T1BN0 stage Ia pathologic stage  Treatment plan: 1.  Given her favorable prognostic features and the size of the tumor, we decided not to do Oncotype 2.   declined radiation therapy 3. Adjuvant antiestrogen therapy: Anastrozole 1 mg daily started 10/05/2019  Anastrozole toxicities: Denies any major adverse effects.  Bilateral knee pain: Patient plans to lose weight so that she can have bilateral knee operations next year.  She is unable to walk because of the knees.  Breast cancer surveillance: 1.  Breast exam 09/18/2020: Benign 2. Mammogram 09/13/2020: Benign breast density category B 3.  Bone density 08/01/2020: T score -1.5: Osteopenia  Return to clinic in 1 year for follow-up     No orders of the defined types were placed in this encounter.  The patient has a good understanding of the overall plan. she agrees with it. she will call with any problems that may develop before the next visit here.  Total time spent: 20 mins including face to face time and time spent for planning, charting and coordination of care  Nicholas Lose, MD 09/18/2020  I, Cloyde Reams Dorshimer, am acting as scribe for Dr. Nicholas Lose.  I have reviewed the above documentation for accuracy and completeness, and I agree with the above.

## 2020-09-18 ENCOUNTER — Other Ambulatory Visit: Payer: Self-pay

## 2020-09-18 ENCOUNTER — Telehealth: Payer: Self-pay | Admitting: Hematology and Oncology

## 2020-09-18 ENCOUNTER — Inpatient Hospital Stay: Payer: Medicare HMO | Attending: Hematology and Oncology | Admitting: Hematology and Oncology

## 2020-09-18 DIAGNOSIS — Z79811 Long term (current) use of aromatase inhibitors: Secondary | ICD-10-CM | POA: Insufficient documentation

## 2020-09-18 DIAGNOSIS — Z170 Estrogen receptor positive status [ER+]: Secondary | ICD-10-CM | POA: Diagnosis not present

## 2020-09-18 DIAGNOSIS — C50411 Malignant neoplasm of upper-outer quadrant of right female breast: Secondary | ICD-10-CM | POA: Insufficient documentation

## 2020-09-18 DIAGNOSIS — M8580 Other specified disorders of bone density and structure, unspecified site: Secondary | ICD-10-CM | POA: Insufficient documentation

## 2020-09-18 MED ORDER — LETROZOLE 2.5 MG PO TABS: 2.5000 mg | ORAL_TABLET | Freq: Every day | ORAL | 3 refills | Status: DC

## 2020-09-18 NOTE — Assessment & Plan Note (Signed)
09/15/2019:Right lumpectomy ([facility]): IDC with DCIS, 0.9cm, clear margins, 3 right axillary lymph nodes negative.  ER 100%, PR 70%, Ki-67 15%, HER-2 negative, T1BN0 stage Ia pathologic stage  Treatment plan: 1.  Given her favorable prognostic features and the size of the tumor, we decided not to do Oncotype 2.   declined radiation therapy 3. Adjuvant antiestrogen therapy: Anastrozole 1 mg daily started 10/05/2019  Anastrozole toxicities:  Breast cancer surveillance: 1.  Breast exam 09/18/2020: Benign 2. Mammogram 09/13/2020: Benign breast density category B 3.  Bone density 08/01/2020: T score -1.5: Osteopenia  Return to clinic in 1 year for follow-up

## 2020-09-18 NOTE — Telephone Encounter (Signed)
Scheduled appts per 12/20 los. Pt stated she would refer to mychart for AVS and appt details.

## 2020-10-09 ENCOUNTER — Other Ambulatory Visit: Payer: Self-pay

## 2020-10-09 ENCOUNTER — Encounter: Payer: Self-pay | Admitting: Family Medicine

## 2020-10-09 ENCOUNTER — Ambulatory Visit (INDEPENDENT_AMBULATORY_CARE_PROVIDER_SITE_OTHER): Payer: Medicare HMO | Admitting: Family Medicine

## 2020-10-09 VITALS — BP 128/68 | HR 75 | Temp 98.2°F | Ht 62.0 in | Wt 211.2 lb

## 2020-10-09 DIAGNOSIS — M25561 Pain in right knee: Secondary | ICD-10-CM | POA: Diagnosis not present

## 2020-10-09 DIAGNOSIS — M25562 Pain in left knee: Secondary | ICD-10-CM | POA: Diagnosis not present

## 2020-10-09 DIAGNOSIS — I10 Essential (primary) hypertension: Secondary | ICD-10-CM | POA: Diagnosis not present

## 2020-10-09 DIAGNOSIS — G8929 Other chronic pain: Secondary | ICD-10-CM | POA: Diagnosis not present

## 2020-10-09 NOTE — Patient Instructions (Signed)
Find out what your insurance will cover regarding steroid injections in the knee and X-rays.  Keep the diet clean and stay active.  Because your blood pressure is well-controlled, you no longer have to check your blood pressure at home anymore unless you wish. Some people check it twice daily every day and some people stop altogether. Either or anything in between is fine. Strong work!  Let us know if you need anything.  Knee Exercises It is normal to feel mild stretching, pulling, tightness, or discomfort as you do these exercises, but you should stop right away if you feel sudden pain or your pain gets worse. STRETCHING AND RANGE OF MOTION EXERCISES  These exercises warm up your muscles and joints and improve the movement and flexibility of your knee. These exercises also help to relieve pain, numbness, and tingling. Exercise A: Knee Extension, Prone  1. Lie on your abdomen on a bed. 2. Place your left / right knee just beyond the edge of the surface so your knee is not on the bed. You can put a towel under your left / right thigh just above your knee for comfort. 3. Relax your leg muscles and allow gravity to straighten your knee. You should feel a stretch behind your left / right knee. 4. Hold this position for 30 seconds. 5. Scoot up so your knee is supported between repetitions. Repeat 2 times. Complete this stretch 3 times per week. Exercise B: Knee Flexion, Active    1. Lie on your back with both knees straight. If this causes back discomfort, bend your left / right knee so your foot is flat on the floor. 2. Slowly slide your left / right heel back toward your buttocks until you feel a gentle stretch in the front of your knee or thigh. 3. Hold this position for 30 seconds. 4. Slowly slide your left / right heel back to the starting position. Repeat 2 times. Complete this exercise 3 times per week. Exercise C: Quadriceps, Prone    1. Lie on your abdomen on a firm surface, such  as a bed or padded floor. 2. Bend your left / right knee and hold your ankle. If you cannot reach your ankle or pant leg, loop a belt around your foot and grab the belt instead. 3. Gently pull your heel toward your buttocks. Your knee should not slide out to the side. You should feel a stretch in the front of your thigh and knee. 4. Hold this position for 30 seconds. Repeat 2 times. Complete this stretch 3 times per week. Exercise D: Hamstring, Supine  1. Lie on your back. 2. Loop a belt or towel over the ball of your left / right foot. The ball of your foot is on the walking surface, right under your toes. 3. Straighten your left / right knee and slowly pull on the belt to raise your leg until you feel a gentle stretch behind your knee. ? Do not let your left / right knee bend while you do this. ? Keep your other leg flat on the floor. 4. Hold this position for 30 seconds. Repeat 2 times. Complete this stretch 3 times per week. STRENGTHENING EXERCISES  These exercises build strength and endurance in your knee. Endurance is the ability to use your muscles for a long time, even after they get tired. Exercise E: Quadriceps, Isometric    1. Lie on your back with your left / right leg extended and your other knee bent. Put  a rolled towel or small pillow under your knee if told by your health care provider. 2. Slowly tense the muscles in the front of your left / right thigh. You should see your kneecap slide up toward your hip or see increased dimpling just above the knee. This motion will push the back of the knee toward the floor. 3. For 3 seconds, keep the muscle as tight as you can without increasing your pain. 4. Relax the muscles slowly and completely. Repeat for 10 total reps Repeat 2 ti mes. Complete this exercise 3 times per week. Exercise F: Straight Leg Raises - Quadriceps  1. Lie on your back with your left / right leg extended and your other knee bent. 2. Tense the muscles in the  front of your left / right thigh. You should see your kneecap slide up or see increased dimpling just above the knee. Your thigh may even shake a bit. 3. Keep these muscles tight as you raise your leg 4-6 inches (10-15 cm) off the floor. Do not let your knee bend. 4. Hold this position for 3 seconds. 5. Keep these muscles tense as you lower your leg. 6. Relax your muscles slowly and completely after each repetition. 10 total reps. Repeat 2 times. Complete this exercise 3 times per week.  Exercise G: Hamstring Curls    If told by your health care provider, do this exercise while wearing ankle weights. Begin with 5 lb weights (optional). Then increase the weight by 1 lb (0.5 kg) increments. Do not wear ankle weights that are more than 20 lbs to start with. 1. Lie on your abdomen with your legs straight. 2. Bend your left / right knee as far as you can without feeling pain. Keep your hips flat against the floor. 3. Hold this position for 3 seconds. 4. Slowly lower your leg to the starting position. Repeat for 10 reps.  Repeat 2 times. Complete this exercise 3 times per week. Exercise H: Squats (Quadriceps)  1. Stand in front of a table, with your feet and knees pointing straight ahead. You may rest your hands on the table for balance but not for support. 2. Slowly bend your knees and lower your hips like you are going to sit in a chair. ? Keep your weight over your heels, not over your toes. ? Keep your lower legs upright so they are parallel with the table legs. ? Do not let your hips go lower than your knees. ? Do not bend lower than told by your health care provider. ? If your knee pain increases, do not bend as low. 3. Hold the squat position for 1 second. 4. Slowly push with your legs to return to standing. Do not use your hands to pull yourself to standing. Repeat 2 times. Complete this exercise 3 times per week. Exercise I: Wall Slides (Quadriceps)    1. Lean your back against a  smooth wall or door while you walk your feet out 18-24 inches (46-61 cm) from it. 2. Place your feet hip-width apart. 3. Slowly slide down the wall or door until your knees Repeat 2 times. Complete this exercise every other day. 4. Exercise K: Straight Leg Raises - Hip Abductors  1. Lie on your side with your left / right leg in the top position. Lie so your head, shoulder, knee, and hip line up. You may bend your bottom knee to help you keep your balance. 2. Roll your hips slightly forward so your hips are  stacked directly over each other and your left / right knee is facing forward. 3. Leading with your heel, lift your top leg 4-6 inches (10-15 cm). You should feel the muscles in your outer hip lifting. ? Do not let your foot drift forward. ? Do not let your knee roll toward the ceiling. 4. Hold this position for 3 seconds. 5. Slowly return your leg to the starting position. 6. Let your muscles relax completely after each repetition. 10 total reps. Repeat 2 times. Complete this exercise 3 times per week. Exercise J: Straight Leg Raises - Hip Extensors  1. Lie on your abdomen on a firm surface. You can put a pillow under your hips if that is more comfortable. 2. Tense the muscles in your buttocks and lift your left / right leg about 4-6 inches (10-15 cm). Keep your knee straight as you lift your leg. 3. Hold this position for 3 seconds. 4. Slowly lower your leg to the starting position. 5. Let your leg relax completely after each repetition. Repeat 2 times. Complete this exercise 3 times per week. Document Released: 07/31/2005 Document Revised: 06/10/2016 Document Reviewed: 07/23/2015 Elsevier Interactive Patient Education  2017 [employer].

## 2020-10-09 NOTE — Progress Notes (Signed)
Chief Complaint  Patient presents with  . Follow-up    Subjective Kelly Dixon is a 75 y.o. female who presents for hypertension follow up. She does monitor home blood pressures. Blood pressures ranging from 130's/80's on average. She is compliant with medication- Norvasc 5 mg/d. Patient has these side effects of medication: none She is adhering to a healthy diet overall. Current exercise: none No CP or SOB.   Knee pain This progressively worse issue of both knees has been going on for nearly 20 years.  No injury or change in activity.  She was told she has arthritis but cannot get any replacement because she was too young.  It is affecting her walking.  She wants to travel to [place] but cannot due to its effect on her ambulation.  She had an injection several years ago but does not think it worked well.  She has not had imaging in over 3 years.   Past Medical History:  Diagnosis Date  . Arthritis   . Breast cancer ([place])   . Gallstones 05/31/2018  . GERD (gastroesophageal reflux disease)   . PONV (postoperative nausea and vomiting)   . Syphilis     Exam BP 128/68 (BP Location: Left Arm, Patient Position: Sitting, Cuff Size: Normal)   Pulse 75   Temp 98.2 F (36.8 C) (Oral)   Ht 5\' 2"  (1.575 m)   Wt 211 lb 4 oz (95.8 kg)   SpO2 97%   BMI 38.64 kg/m  General:  well developed, well nourished, in no apparent distress Heart: RRR, no bruits, no LE edema Lungs: clear to auscultation, no accessory muscle use Neuro: Antalgic gait Psych: well oriented with normal range of affect and appropriate judgment/insight  Essential hypertension  Chronic pain of both knees  1. continue Norvasc 5 mg daily.  Does not need to monitor blood pressure at home anymore.  She is currently controlled.  Counseled on diet and exercise. 2. she will contact her insurance company regarding coverage for x-rays and steroid injections.  If nothing is covered, I will likely refer her to the orthopedic  surgery team.  She will let me know though. F/u in 6 mo for CPE, will likely need PCV23. The patient voiced understanding and agreement to the plan.  [place], DO 10/09/20  7:53 AM

## 2020-11-17 ENCOUNTER — Ambulatory Visit (INDEPENDENT_AMBULATORY_CARE_PROVIDER_SITE_OTHER): Payer: Medicare HMO | Admitting: Family Medicine

## 2020-11-17 ENCOUNTER — Encounter: Payer: Self-pay | Admitting: Family Medicine

## 2020-11-17 ENCOUNTER — Other Ambulatory Visit: Payer: Self-pay

## 2020-11-17 VITALS — BP 120/78 | HR 84 | Temp 98.4°F | Ht 62.0 in | Wt 206.2 lb

## 2020-11-17 DIAGNOSIS — S29012A Strain of muscle and tendon of back wall of thorax, initial encounter: Secondary | ICD-10-CM

## 2020-11-17 DIAGNOSIS — S46811A Strain of other muscles, fascia and tendons at shoulder and upper arm level, right arm, initial encounter: Secondary | ICD-10-CM

## 2020-11-17 MED ORDER — TIZANIDINE HCL 4 MG PO TABS: 4.0000 mg | ORAL_TABLET | Freq: Three times a day (TID) | ORAL | 0 refills | Status: DC | PRN

## 2020-11-17 MED ORDER — PREDNISONE 20 MG PO TABS: 40.0000 mg | ORAL_TABLET | Freq: Every day | ORAL | 0 refills | Status: AC

## 2020-11-17 NOTE — Patient Instructions (Addendum)
Ice/cold pack over area for 10-15 min twice daily.  Heat (pad or rice pillow in microwave) over affected area, 10-15 minutes twice daily.   OK to take Tylenol 1000 mg (2 extra strength tabs) or 975 mg (3 regular strength tabs) every 6 hours as needed.  Let us know if you need anything.  Trapezius stretches/exercises Do exercises exactly as told by your health care provider and adjust them as directed. It is normal to feel mild stretching, pulling, tightness, or discomfort as you do these exercises, but you should stop right away if you feel sudden pain or your pain gets worse.  Stretching and range of motion exercises These exercises warm up your muscles and joints and improve the movement and flexibility of your shoulder. These exercises can also help to relieve pain, numbness, and tingling. If you are unable to do any of the following for any reason, do not further attempt to do it.   Exercise A: Flexion, standing    1. Stand and hold a broomstick, a cane, or a similar object. Place your hands a little more than shoulder-width apart on the object. Your left / right hand should be palm-up, and your other hand should be palm-down. 2. Push the stick to raise your left / right arm out to your side and then over your head. Use your other hand to help move the stick. Stop when you feel a stretch in your shoulder, or when you reach the angle that is recommended by your health care provider. ? Avoid shrugging your shoulder while you raise your arm. Keep your shoulder blade tucked down toward your spine. 3. Hold for 30 seconds. 4. Slowly return to the starting position. Repeat 2 times. Complete this exercise 3 times per week.  Exercise B: Abduction, supine    1. Lie on your back and hold a broomstick, a cane, or a similar object. Place your hands a little more than shoulder-width apart on the object. Your left / right hand should be palm-up, and your other hand should be palm-down. 2. Push  the stick to raise your left / right arm out to your side and then over your head. Use your other hand to help move the stick. Stop when you feel a stretch in your shoulder, or when you reach the angle that is recommended by your health care provider. ? Avoid shrugging your shoulder while you raise your arm. Keep your shoulder blade tucked down toward your spine. 3. Hold for 30 seconds. 4. Slowly return to the starting position. Repeat 2 times. Complete this exercise 3 times per week.  Exercise C: Flexion, active-assisted    1. Lie on your back. You may bend your knees for comfort. 2. Hold a broomstick, a cane, or a similar object. Place your hands about shoulder-width apart on the object. Your palms should face toward your feet. 3. Raise the stick and move your arms over your head and behind your head, toward the floor. Use your healthy arm to help your left / right arm move farther. Stop when you feel a gentle stretch in your shoulder, or when you reach the angle where your health care provider tells you to stop. 4. Hold for 30 seconds. 5. Slowly return to the starting position. Repeat 2 times. Complete this exercise 3 times per week.  Exercise D: External rotation and abduction    1. Stand in a door frame with one of your feet slightly in front of the other. This is called  is called a staggered stance. 2. Choose one of the following positions as told by your health care provider: ? Place your hands and forearms on the door frame above your head. ? Place your hands and forearms on the door frame at the height of your head. ? Place your hands on the door frame at the height of your elbows. 3. Slowly move your weight onto your front foot until you feel a stretch across your chest and in the front of your shoulders. Keep your head and chest upright and keep your abdominal muscles tight. 4. Hold for 30 seconds. 5. To release the stretch, shift your weight to your back foot. Repeat 2 times. Complete  this stretch 3 times per week.  Strengthening exercises These exercises build strength and endurance in your shoulder. Endurance is the ability to use your muscles for a long time, even after your muscles get tired. Exercise E: Scapular depression and adduction  1. Sit on a stable chair. Support your arms in front of you with pillows, armrests, or a tabletop. Keep your elbows in line with the sides of your body. 2. Gently move your shoulder blades down toward your middle back. Relax the muscles on the tops of your shoulders and in the back of your neck. 3. Hold for 3 seconds. 4. Slowly release the tension and relax your muscles completely before doing this exercise again. Repeat for a total of 10 repetitions. 5. After you have practiced this exercise, try doing the exercise without the arm support. Then, try the exercise while standing instead of sitting. Repeat 2 times. Complete this exercise 3 times per week.  Exercise F: Shoulder abduction, isometric    1. Stand or sit about 4-6 inches (10-15 cm) from a wall with your left / right side facing the wall. 2. Bend your left / right elbow and gently press your elbow against the wall. 3. Increase the pressure slowly until you are pressing as hard as you can without shrugging your shoulder. 4. Hold for 3 seconds. 5. Slowly release the tension and relax your muscles completely. Repeat for a total of 10 repetitions. Repeat 2 times. Complete this exercise 3 times per week.  Exercise G: Shoulder flexion, isometric    1. Stand or sit about 4-6 inches (10-15 cm) away from a wall with your left / right side facing the wall. 2. Keep your left / right elbow straight and gently press the top of your fist against the wall. Increase the pressure slowly until you are pressing as hard as you can without shrugging your shoulder. 3. Hold for 10-15 seconds. 4. Slowly release the tension and relax your muscles completely. Repeat for a total of 10  repetitions. Repeat 2 times. Complete this exercise 3 times per week.  Exercise H: Internal rotation    1. Sit in a stable chair without armrests, or stand. Secure an exercise band at your left / right side, at elbow height. 2. Place a soft object, such as a folded towel or a small pillow, under your left / right upper arm so your elbow is a few inches (about 8 cm) away from your side. 3. Hold the end of the exercise band so the band stretches. 4. Keeping your elbow pressed against the soft object under your arm, move your forearm across your body toward your abdomen. Keep your body steady so the movement is only coming from your shoulder. 5. Hold for 3 seconds. 6. Slowly return to the starting position.   Repeat for a total of 10 repetitions. Repeat 2 times. Complete this exercise 3 times per week.  Exercise I: External rotation    1. Sit in a stable chair without armrests, or stand. 2. Secure an exercise band at your left / right side, at elbow height. 3. Place a soft object, such as a folded towel or a small pillow, under your left / right upper arm so your elbow is a few inches (about 8 cm) away from your side. 4. Hold the end of the exercise band so the band stretches. 5. Keeping your elbow pressed against the soft object under your arm, move your forearm out, away from your abdomen. Keep your body steady so the movement is only coming from your shoulder. 6. Hold for 3 seconds. 7. Slowly return to the starting position. Repeat for a total of 10 repetitions. Repeat 2 times. Complete this exercise 3 times per week. Exercise J: Shoulder extension  1. Sit in a stable chair without armrests, or stand. Secure an exercise band to a stable object in front of you so the band is at shoulder height. 2. Hold one end of the exercise band in each hand. Your palms should face each other. 3. Straighten your elbows and lift your hands up to shoulder height. 4. Step back, away from the secured end of  the exercise band, until the band stretches. 5. Squeeze your shoulder blades together and pull your hands down to the sides of your thighs. Stop when your hands are straight down by your sides. Do not let your hands go behind your body. 6. Hold for 3 seconds. 7. Slowly return to the starting position. Repeat for a total of 10 repetitions. Repeat 2 times. Complete this exercise 3 times per week.  Exercise K: Shoulder extension, prone    1. Lie on your abdomen on a firm surface so your left / right arm hangs over the edge. 2. Hold a 5 lb weight in your hand so your palm faces in toward your body. Your arm should be straight. 3. Squeeze your shoulder blade down toward the middle of your back. 4. Slowly raise your arm behind you, up to the height of the surface that you are lying on. Keep your arm straight. 5. Hold for 3 seconds. 6. Slowly return to the starting position and relax your muscles. Repeat for a total of 10 repetitions. Repeat 2 times. Complete this exercise 3 times per week.   Exercise L: Horizontal abduction, prone  1. Lie on your abdomen on a firm surface so your left / right arm hangs over the edge. 2. Hold a 5 lb weight in your hand so your palm faces toward your feet. Your arm should be straight. 3. Squeeze your shoulder blade down toward the middle of your back. 4. Bend your elbow so your hand moves up, until your elbow is bent to an "L" shape (90 degrees). With your elbow bent, slowly move your forearm forward and up. Raise your hand up to the height of the surface that you are lying on. ? Your upper arm should not move, and your elbow should stay bent. ? At the top of the movement, your palm should face the floor. 5. Hold for 3 seconds. 6. Slowly return to the starting position and relax your muscles. Repeat for a total of 10 repetitions. Repeat 2 times. Complete this exercise 3 times per week.  Exercise M: Horizontal abduction, standing  1. Sit on a stable chair, or    2. Secure an exercise band to a stable object in front of you so the band is at shoulder height. 3. Hold one end of the exercise band in each hand. 4. Straighten your elbows and lift your hands straight in front of you, up to shoulder height. Your palms should face down, toward the floor. 5. Step back, away from the secured end of the exercise band, until the band stretches. 6. Move your arms out to your sides, and keep your arms straight. 7. Hold for 3 seconds. 8. Slowly return to the starting position. Repeat for a total of 10 repetitions. Repeat 2 times. Complete this exercise 3 times per week.  Exercise N: Scapular retraction and elevation  1. Sit on a stable chair, or stand. 2. Secure an exercise band to a stable object in front of you so the band is at shoulder height. 3. Hold one end of the exercise band in each hand. Your palms should face each other. 4. Sit in a stable chair without armrests, or stand. 5. Step back, away from the secured end of the exercise band, until the band stretches. 6. Squeeze your shoulder blades together and lift your hands over your head. Keep your elbows straight. 7. Hold for 3 seconds. 8. Slowly return to the starting position. Repeat for a total of 10 repetitions. Repeat 2 times. Complete this exercise 3 times per week.  This information is not intended to replace advice given to you by your health care provider. Make sure you discuss any questions you have with your health care provider. Document Released: 09/16/2005 Document Revised: 05/23/2016 Document Reviewed: 08/03/2015 [employer] Interactive Patient Education  2017 [employer].   Mid-Back Strain Rehab It is normal to feel mild stretching, pulling, tightness, or discomfort as you do these exercises, but you should stop right away if you feel sudden pain or your pain gets worse.   Stretching and range of motion exercises This exercise warms up your muscles and joints and improves the  movement and flexibility of your back and shoulders. This exercise also help to relieve pain. Exercise A: Chest and spine stretch    1. Lie down on your back on a firm surface. 2. Roll a towel or a small blanket so it is about 4 inches (10 cm) in diameter. 3. Put the towel lengthwise under the middle of your back so it is under your spine, but not under your shoulder blades. 4. To increase the stretch, you may put your hands behind your head and let your elbows fall to your sides. 5. Hold for 30 seconds. Repeat exercise 2 times. Complete this exercise 3 times per week.  Strengthening exercises These exercises build strength and endurance in your back and your shoulder blade muscles. Endurance is the ability to use your muscles for a long time, even after they get tired. Exercise B: Alternating arm and leg raises    1. Get on your hands and knees on a firm surface. If you are on a hard floor, you may want to use padding to cushion your knees, such as an exercise mat. 2. Line up your arms and legs. Your hands should be below your shoulders, and your knees should be below your hips. 3. Lift your left leg behind you. At the same time, raise your right arm and straighten it in front of you. ? Do not lift your leg higher than your hip. ? Do not lift your arm higher than your shoulder. ? Keep your abdominal  and back muscles tight. ? Keep your hips facing the ground. ? Do not arch your back. ? Keep your balance carefully, and do not hold your breath. 4. Hold for 3 seconds. 5. Slowly return to the starting position and repeat with your right leg and your left arm. Repeat 2 times. Complete this exercise 3 times per week. Exercise C: Straight arm rows (shoulder extension)     1. Stand with your feet shoulder width apart. 2. Secure an exercise band to a stable object in front of you so the band is at or above shoulder height. 3. Hold one end of the exercise band in each hand. 4. Straighten your  elbows and lift your hands up to shoulder height. 5. Step back, away from the secured end of the exercise band, until the band stretches. 6. Squeeze your shoulder blades together and pull your hands down to the sides of your thighs. Stop when your hands are straight down by your sides. Do not let your hands go behind your body. 7. Hold for 3 seconds. 8. Slowly return to the starting position. Repeat 2 times. Complete this exercise 3 times per week. Exercise D: Shoulder external rotation, prone 1. Lie on your abdomen on a firm bed so your left / right forearm hangs over the edge of the bed and your upper arm is on the bed, straight out from your body. ? Your elbow should be bent. ? Your palm should be facing your feet. 2. If instructed, hold a 5 lb weight in your hand. 3. Squeeze your shoulder blade toward the middle of your back. Do not let your shoulder lift toward your ear. 4. Keep your elbow bent in an "L" shape (90 degrees) while you slowly move your forearm up toward the ceiling. Move your forearm up to the height of the bed, toward your head. ? Your upper arm should not move. ? At the top of the movement, your palm should face the floor. 5. Hold for 3 seconds. 6. Slowly return to the starting position and relax your muscles. Repeat 3 times. Complete this exercise 3 times per week. Exercise E: Scapular retraction and external rotation, rowing    1. Sit in a stable chair without armrests, or stand. 2. Secure an exercise band to a stable object in front of you so it is at shoulder height. 3. Hold one end of the exercise band in each hand. 4. Bring your arms out straight in front of you. 5. Step back, away from the secured end of the exercise band, until the band stretches. 6. Pull the band backward. As you do this, bend your elbows and squeeze your shoulder blades together, but avoid letting the rest of your body move. Do not let your shoulders lift up toward your ears. 7. Stop when  your elbows are at your sides or slightly behind your body. 8. Hold for 5 seconds. 9. Slowly straighten your arms to return to the starting position. Repeat 2 times. Complete this exercise 3 times per week. Posture and body mechanics    Body mechanics refers to the movements and positions of your body while you do your daily activities. Posture is part of body mechanics. Good posture and healthy body mechanics can help to relieve stress in your body's tissues and joints. Good posture means that your spine is in its natural S-curve position (your spine is neutral), your shoulders are pulled back slightly, and your head is not tipped forward. The following are general  guidelines for applying improved posture and body mechanics to your everyday activities. Standing     When standing, keep your spine neutral and your feet about hip-width apart. Keep a slight bend in your knees. Your ears, shoulders, and hips should line up.  When you do a task in which you lean forward while standing in one place for a long time, place one foot up on a stable object that is 2-4 inches (5-10 cm) high, such as a footstool. This helps keep your spine neutral. Sitting     When sitting, keep your spine neutral and keep your feet flat on the floor. Use a footrest, if necessary, and keep your thighs parallel to the floor. Avoid rounding your shoulders, and avoid tilting your head forward.  When working at a desk or a computer, keep your desk at a height where your hands are slightly lower than your elbows. Slide your chair under your desk so you are close enough to maintain good posture.  When working at a computer, place your monitor at a height where you are looking straight ahead and you do not have to tilt your head forward or downward to look at the screen. Resting    When lying down and resting, avoid positions that are most painful for you.  If you have pain with activities such as sitting, bending,  stooping, or squatting (flexion-based activities), lie in a position in which your body does not bend very much. For example, avoid curling up on your side with your arms and knees near your chest (fetal position).  If you have pain with activities such as standing for a long time or reaching with your arms (extension-based activities), lie with your spine in a neutral position and bend your knees slightly. Try the following positions:  Lying on your side with a pillow between your knees.  Lying on your back with a pillow under your knees.   Lifting     When lifting objects, keep your feet at least shoulder-width apart and tighten your abdominal muscles.  Bend your knees and hips and keep your spine neutral. It is important to lift using the strength of your legs, not your back. Do not lock your knees straight out.  Always ask for help to lift heavy or awkward objects. Make sure you discuss any questions you have with your health care provider.

## 2020-11-17 NOTE — Progress Notes (Signed)
Musculoskeletal Exam  Patient: Kelly Dixon DOB: 12-01-1945  DOS: 11/17/2020  SUBJECTIVE:  Chief Complaint:   Chief Complaint  Patient presents with  . Back Pain    Kelly Dixon is a 75 y.o.  female for evaluation and treatment of R upper back pain.   Onset:  5 days ago. No inj or change in activity.  Location: R trap region Character:  aching and sharp  Progression of issue:  is unchanged Associated symptoms: Radiates down R arm; no bruising, redness, swelling, decreased ROM Treatment: to date has been OTC NSAIDS and acetaminophen.   Neurovascular symptoms: no  Past Medical History:  Diagnosis Date  . Arthritis   . Breast cancer ([place])   . Gallstones 05/31/2018  . GERD (gastroesophageal reflux disease)   . PONV (postoperative nausea and vomiting)   . Syphilis     Objective: VITAL SIGNS: BP 120/78 (BP Location: Left Arm, Patient Position: Sitting, Cuff Size: Normal)   Pulse 84   Temp 98.4 F (36.9 C) (Oral)   Ht 5\' 2"  (1.575 m)   Wt 206 lb 4 oz (93.6 kg)   SpO2 97%   BMI 37.72 kg/m  Constitutional: Well formed, well developed. No acute distress. Thorax & Lungs: No accessory muscle use Musculoskeletal: R shoulder/neck.   Normal active range of motion: yes.   Normal passive range of motion: yes Tenderness to palpation: yes; over the distal right trapezius in addition to the rhomboid at approximately T4-6 Deformity: no Ecchymosis: no Tests positive: none Tests negative: Spurling's Neurologic: Normal sensory function. No focal deficits noted. DTR's equal and symmetric in UE's. No clonus. Psychiatric: Normal mood. Age appropriate judgment and insight. Alert & oriented x 3.    Assessment:  Strain of right trapezius muscle, initial encounter - Plan: predniSONE (DELTASONE) 20 MG tablet, tiZANidine (ZANAFLEX) 4 MG tablet  Rhomboid muscle strain, initial encounter - Plan: predniSONE (DELTASONE) 20 MG tablet, tiZANidine (ZANAFLEX) 4 MG  tablet  Plan: Stretches/exercises, heat, ice, Tylenol.  5-day prednisone burst, 40 mg daily.  Nonsedating Zanaflex as needed.  Physical therapy if no better. F/u prn. The patient voiced understanding and agreement to the plan.   En[place], DO 11/17/20  2:33 PM

## 2020-12-14 IMAGING — US US BREAST*R* LIMITED INC AXILLA
1 series · 10 of 10 positions shown · non-contrast
Comparison: 07/28/2019, 07/27/2018

CLINICAL DATA: Patient returns after screening study for evaluation
of possible RIGHT breast asymmetry.

EXAM:
DIGITAL DIAGNOSTIC RIGHT MAMMOGRAM WITH CAD AND TOMO
ULTRASOUND RIGHT BREAST

[Series 1: us breast*right* limited inc axilla · 0.06mm/px · 10 of 10 slices shown]
[im 1/10]
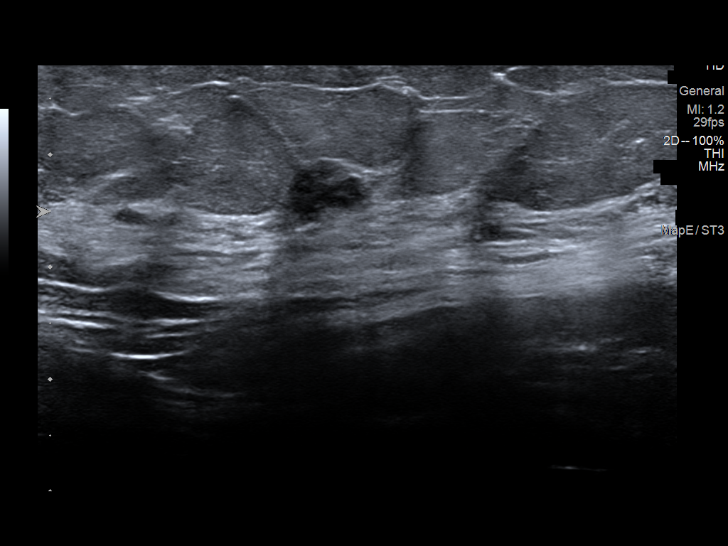
[im 2/10]
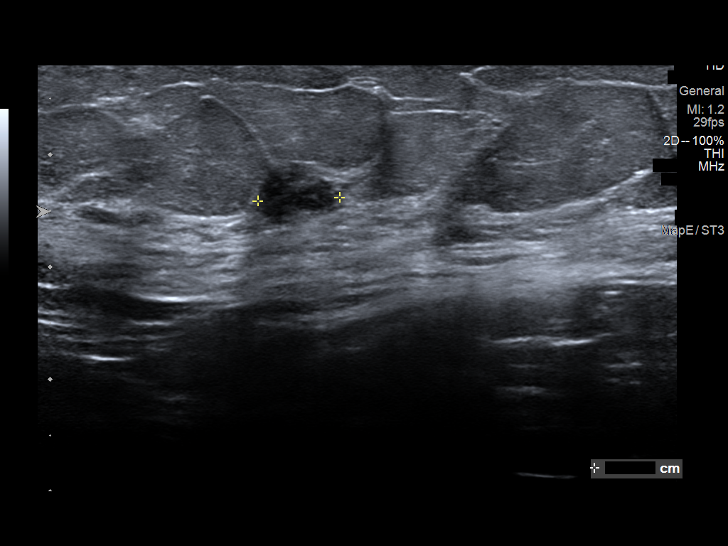
[im 3/10]
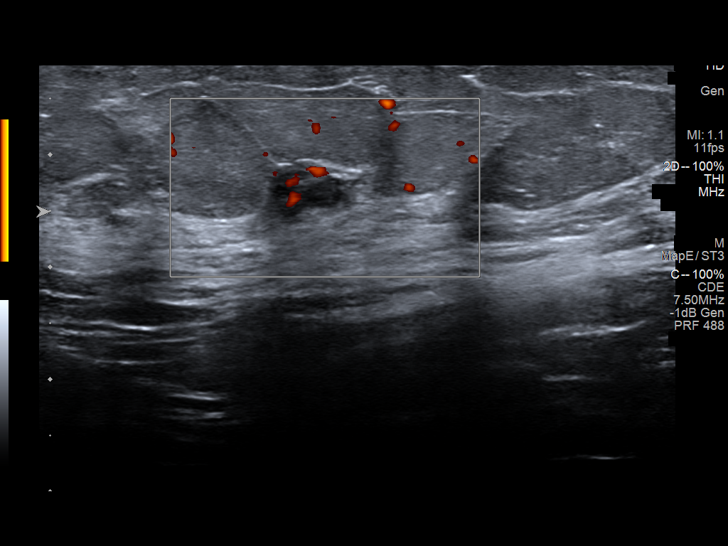
[im 4/10]
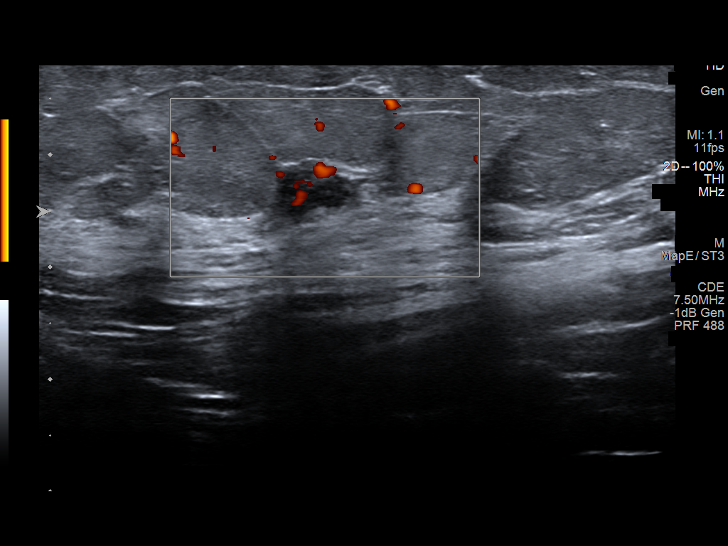
[im 5/10]
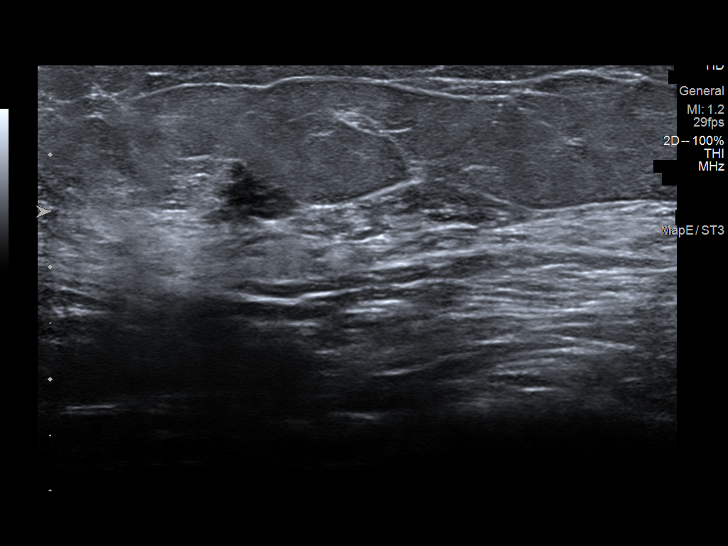
[im 6/10]
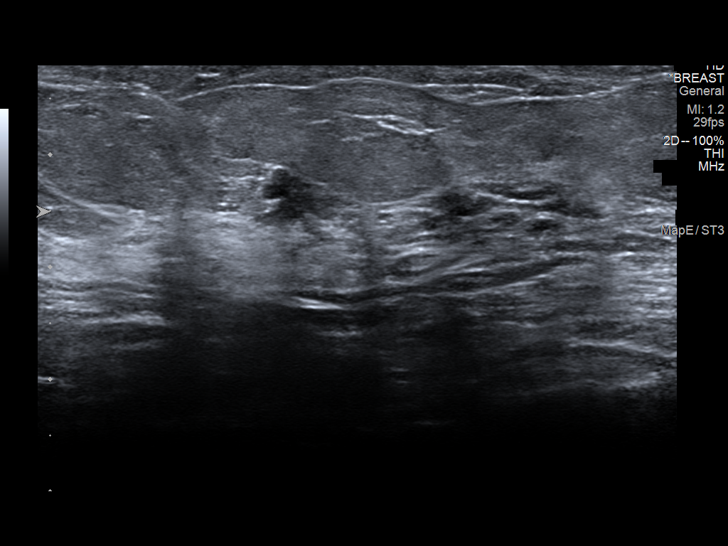
[im 7/10]
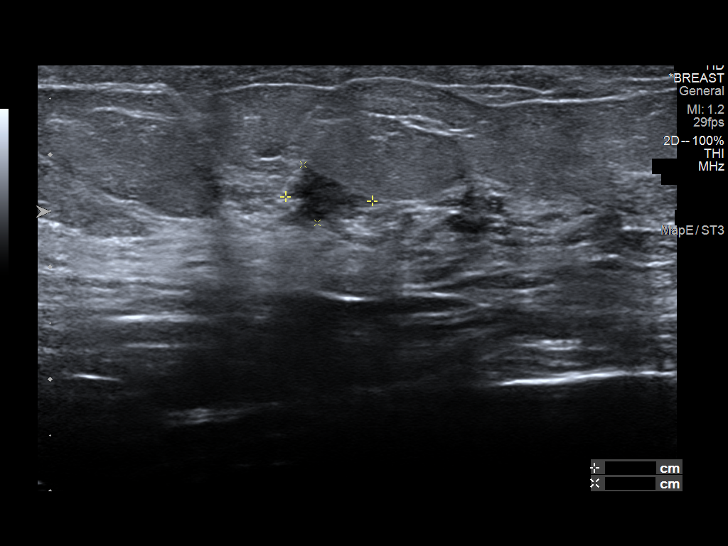
[im 8/10]
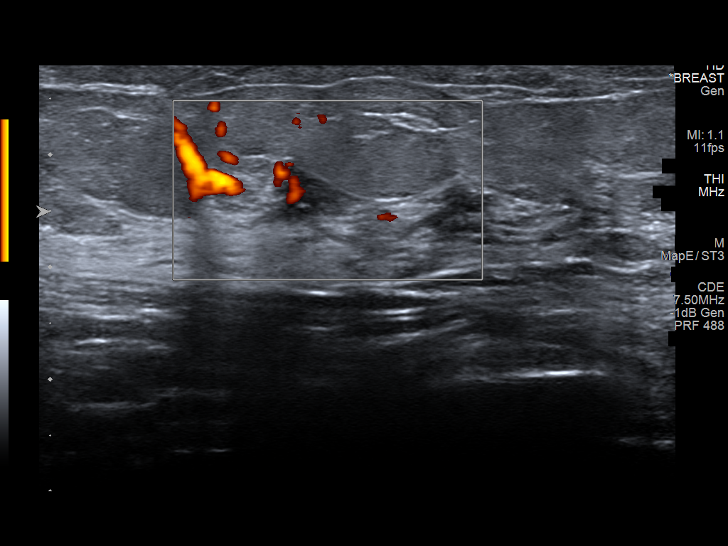
[im 9/10]
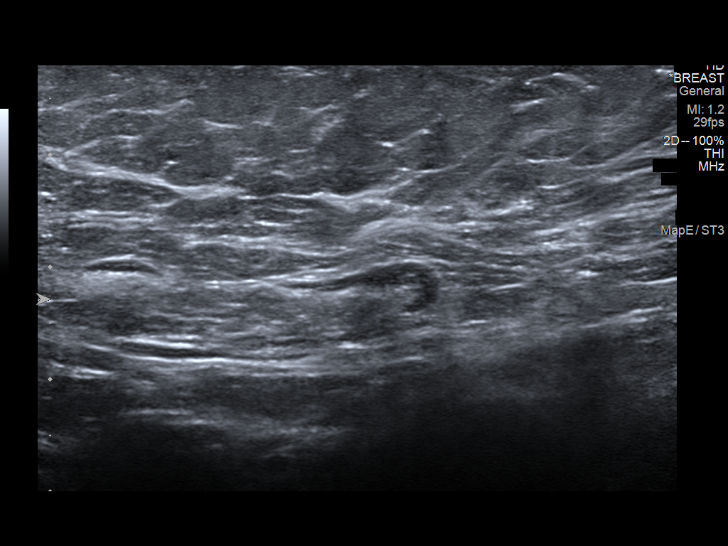
[im 10/10]
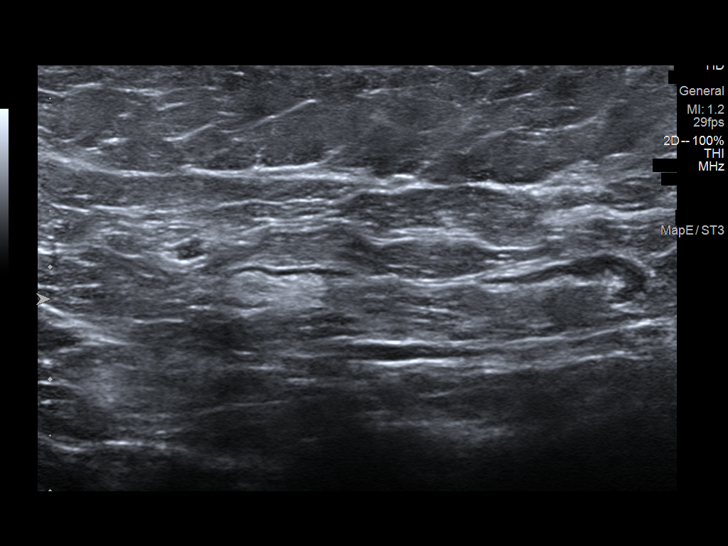

[10 of 10 positions shown; findings below may reference images not displayed]

ACR Breast Density Category b: There are scattered areas of
fibroglandular density.
FINDINGS: Additional 2-D and 3-D images are performed. These views confirm
presence of an indistinct mass in the UPPER-OUTER QUADRANT of the
RIGHT breast and further evaluated with ultrasound.

Mammographic images were processed with CAD.

On physical exam, I palpate no abnormality in the UPPER-OUTER
QUADRANT of the RIGHT breast.

Targeted ultrasound is performed, showing an irregular hypoechoic
mass with angular margins in the 10 o'clock location of the RIGHT
breast 5 centimeters from nipple measuring 0.7 x 0.8 x
centimeters. No posterior acoustic features. There is internal blood
flow on Doppler evaluation.

Evaluation of the RIGHT axilla is negative for adenopathy.
IMPRESSION: Suspicious mass the 10 o'clock location of the RIGHT breast for
which biopsy is recommended.

RECOMMENDATION:
Ultrasound-guided core biopsy RIGHT breast. Biopsy has been
scheduled at the request of the patient for 08/16/2019, to give her
time to followup with her insurance company.

I have discussed the findings and recommendations with the patient.
If applicable, a reminder letter will be sent to the patient
regarding the next appointment.

BI-RADS CATEGORY  4: Suspicious.

## 2021-01-10 ENCOUNTER — Other Ambulatory Visit: Payer: Self-pay | Admitting: Family Medicine

## 2021-01-10 DIAGNOSIS — I10 Essential (primary) hypertension: Secondary | ICD-10-CM

## 2021-01-11 IMAGING — US US  BREAST BX W/ LOC DEV 1ST LESION IMG BX SPEC US GUIDE*R*
1 series · 12 of 22 positions shown · non-contrast
Comparison: Previous exam(s).
COMPARISON: Previous exam(s).

Addendum:
CLINICAL DATA: Patient underwent ultrasound-guided core needle
biopsy of a mass in the right breast at 10 o'clock, 5 cm from the
nipple. At the time of biopsy, 2 adjacent masses also noted, a 1 cm
mass at 10 o'clock, 6 cm from the nipple, and a 1.2 cm mass at 11
o'clock, 4 cm from the nipple. Biopsy was recommended of these 2
additional masses since the pathology from the 10 o'clock, 5 cm from
the nipple mass was invasive ductal carcinoma.

EXAM:
ULTRASOUND GUIDED RIGHT BREAST CORE NEEDLE BIOPSY: 2 BIOPSIES
PERFORMED

[Series 1: us breast bx w/ loc dev 1st lesion img bx spec us  · 0.06mm/px · 12 of 22 slices shown]
[im 1/22]
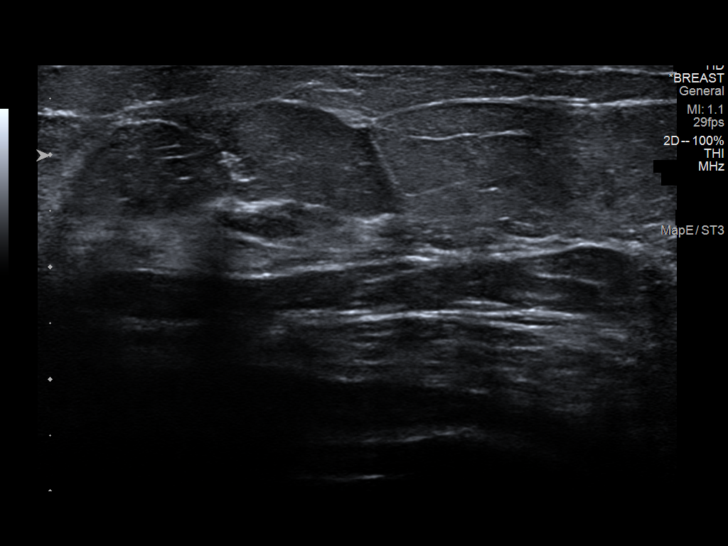
[im 3/22]
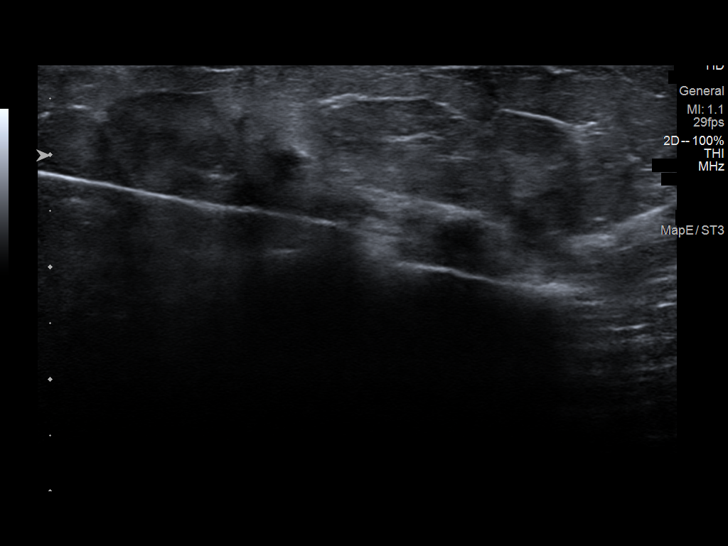
[im 5/22]
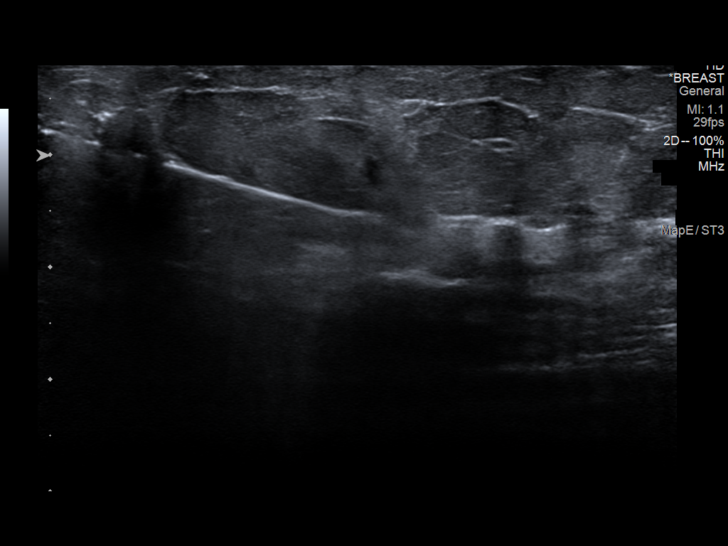
[im 7/22]
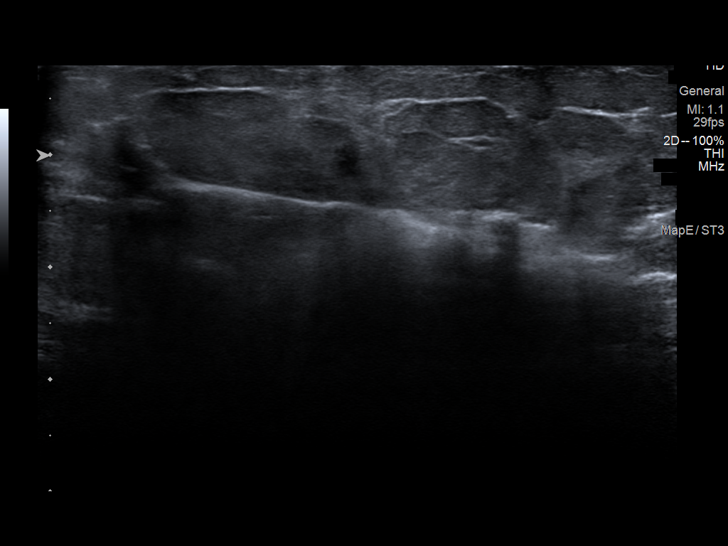
[im 9/22]
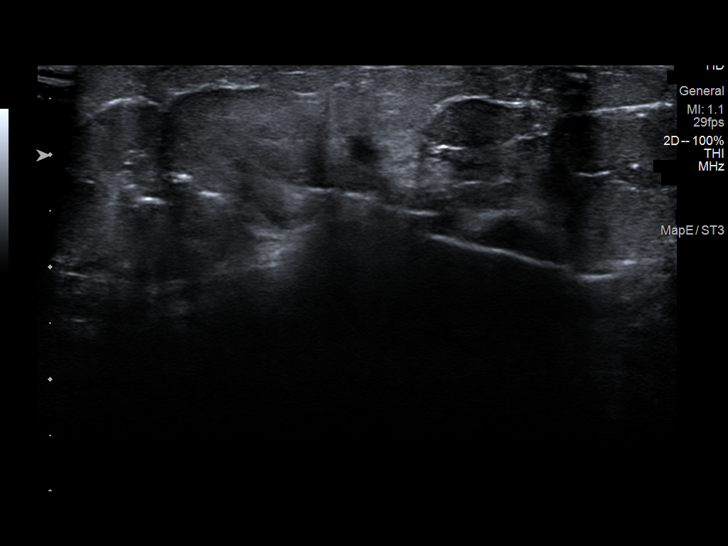
[im 11/22]
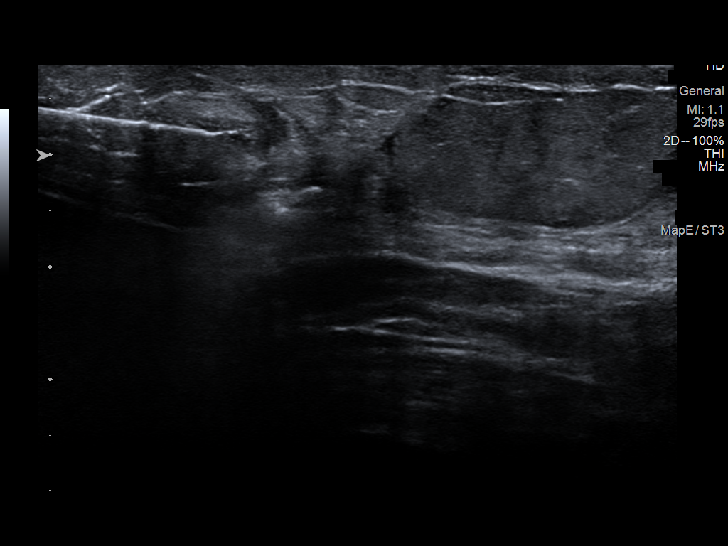
[im 12/22]
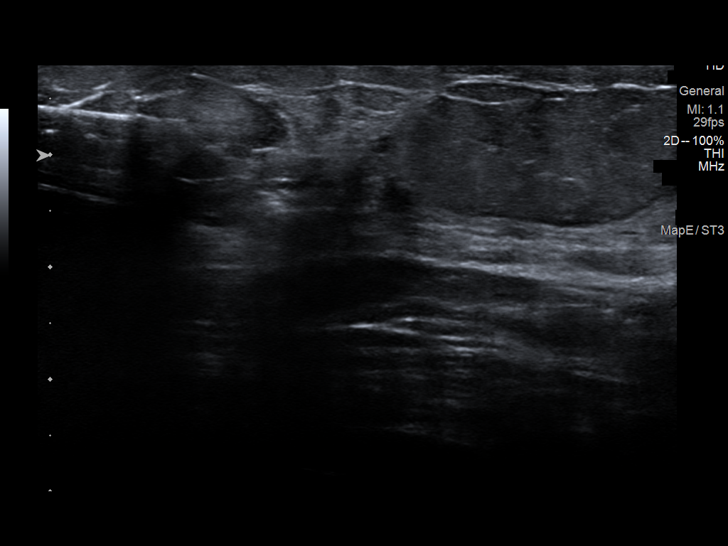
[im 14/22]
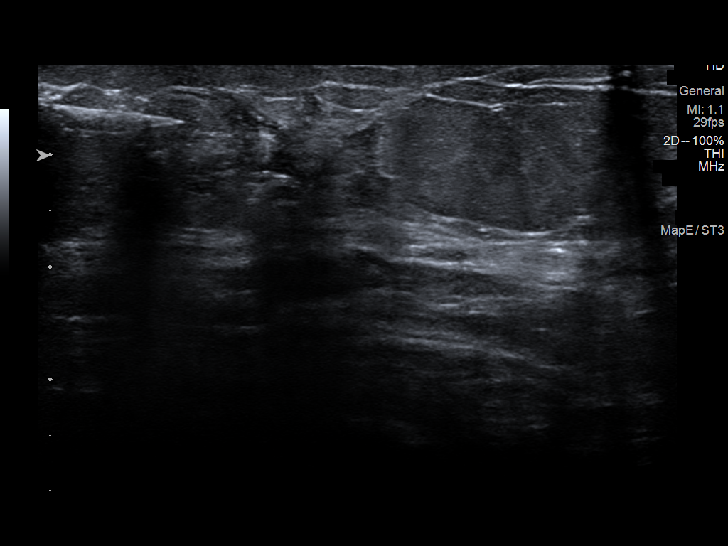
[im 16/22]
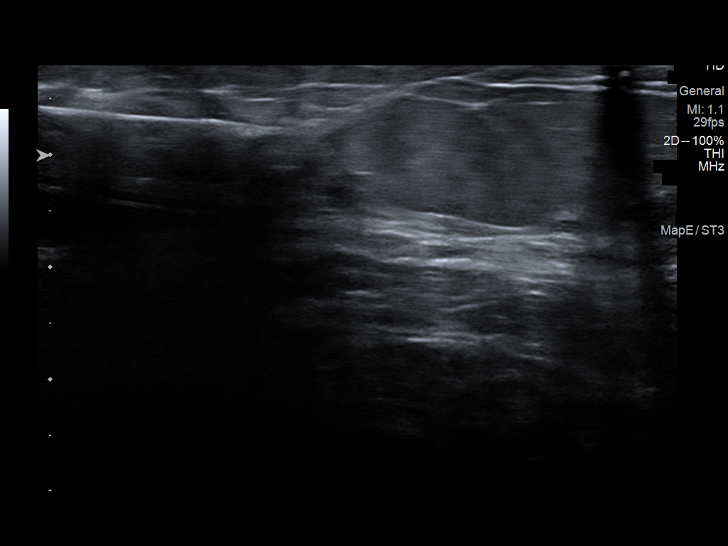
[im 18/22]
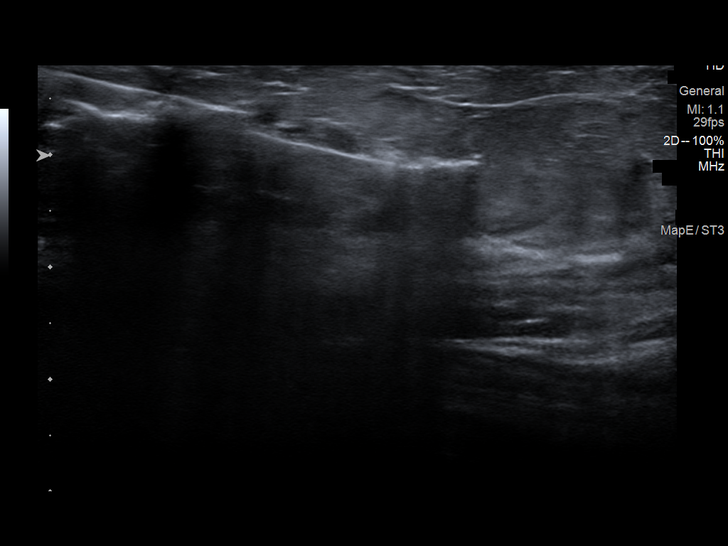
[im 20/22]
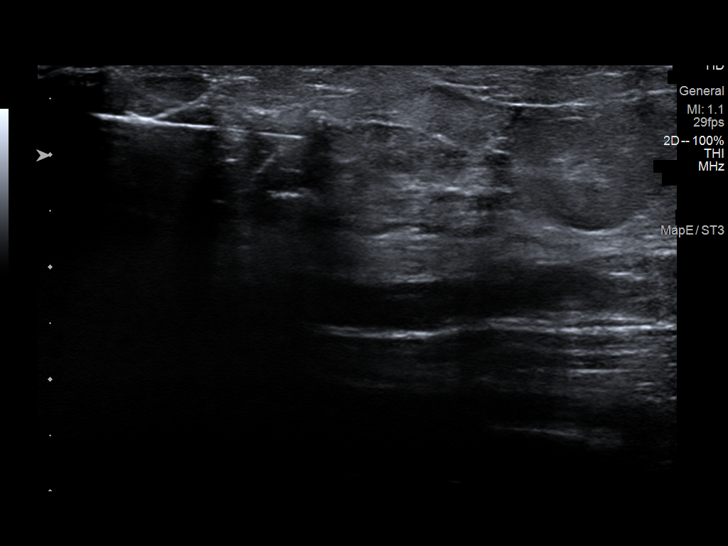
[im 22/22]
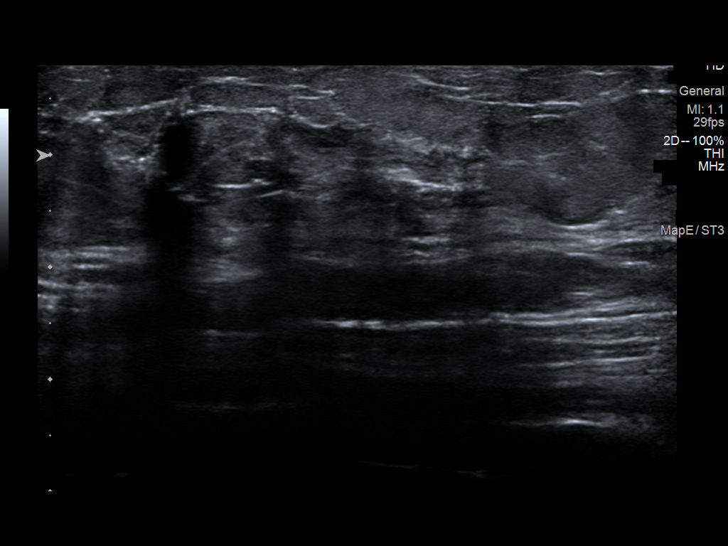

[12 of 22 positions shown; findings below may reference images not displayed]



Lesion #1

Lesion quadrant: Upper outer quadrant, 10 o'clock, 6 cm from the
nipple.

Using sterile technique and 1% Lidocaine as local anesthetic, under
direct ultrasound visualization, a 12 gauge Bokker device was
used to perform biopsy of the 10 o'clock position mass, 6 cm the
nipple, using an inferior approach. At the conclusion of the
procedure coil shaped tissue marker clip was deployed into the
biopsy cavity.

Lesion #2

Lesion quadrant: Upper outer quadrant, 11 o'clock, 4 cm from the
nipple.

Using sterile technique and 1% Lidocaine as local anesthetic, under
direct ultrasound visualization, a 12 gauge Bokker device was
used to perform biopsy of mass at 11 o'clock, 4 cm from the nipple
using an inferior approach. At the conclusion of the procedure heart
shaped tissue marker clip was deployed into the biopsy cavity.

Follow up 2 view mammogram was performed and dictated separately.
IMPRESSION: Ultrasound guided biopsy of 2 masses in the upper outer quadrant of
the right breast adjacent to the recently diagnosed invasive ductal
carcinoma. No apparent complications.

ADDENDUM:
Pathology revealed FIBROCYSTIC CHANGE AND USUAL DUCTAL HYPERPLASIA
of the Right breast, both locations, 10 o'clock, 6 cm fn and 11
o'clock, 4 cm fn. This was found to be concordant by Dr. Tiger
Marifet.

Pathology results were discussed with the patient by telephone. The
patient reported doing well after the biopsies with tenderness at
the sites. Post biopsy instructions and care were reviewed and
questions were answered. The patient was encouraged to call The

The patient has a recent diagnosis of Right breast cancer and should
follow her outlined treatment plan.

Surgical consultation has been arranged with Dr. Suriansyah Rachman at
[REDACTED] on September 07, 2019.

Pathology results reported by Hybrydowe Usz, RN on 09/07/2019.



Lesion #1

Lesion quadrant: Upper outer quadrant, 10 o'clock, 6 cm from the
nipple.

Using sterile technique and 1% Lidocaine as local anesthetic, under
direct ultrasound visualization, a 12 gauge Bokker device was
used to perform biopsy of the 10 o'clock position mass, 6 cm the
nipple, using an inferior approach. At the conclusion of the
procedure coil shaped tissue marker clip was deployed into the
biopsy cavity.

Lesion #2

Lesion quadrant: Upper outer quadrant, 11 o'clock, 4 cm from the
nipple.

Using sterile technique and 1% Lidocaine as local anesthetic, under
direct ultrasound visualization, a 12 gauge Bokker device was
used to perform biopsy of mass at 11 o'clock, 4 cm from the nipple
using an inferior approach. At the conclusion of the procedure heart
shaped tissue marker clip was deployed into the biopsy cavity.

Follow up 2 view mammogram was performed and dictated separately.
IMPRESSION: Ultrasound guided biopsy of 2 masses in the upper outer quadrant of
the right breast adjacent to the recently diagnosed invasive ductal
carcinoma. No apparent complications.

## 2021-01-17 NOTE — Progress Notes (Signed)
Subjective:   Kelly Dixon is a 75 y.o. female who presents for Medicare Annual (Subsequent) preventive examination.  Review of Systems     Cardiac Risk Factors include: advanced age (>63men, >29 women);hypertension;obesity (BMI >30kg/m2)     Objective:    Today's Vitals   01/18/21 1218  BP: (!) 138/96  Pulse: 77  Resp: 16  Temp: 98.1 F (36.7 C)  TempSrc: Temporal  SpO2: 95%  Weight: 210 lb 9.6 oz (95.5 kg)  Height: 5\' 2"  (1.575 m)   Body mass index is 38.52 kg/m.  Advanced Directives 01/18/2021 12/30/2019 10/08/2019 09/15/2019 09/13/2019 04/06/2019 12/28/2018  Does Patient Have a Medical Advance Directive? No No No No No No No  Would patient like information on creating a medical advance directive? Yes (MAU/Ambulatory/Procedural Areas - Information given) No - Patient declined No - Patient declined No - Patient declined No - Patient declined No - Patient declined No - Patient declined    Current Medications (verified) Outpatient Encounter Medications as of 01/18/2021  Medication Sig  . amLODipine (NORVASC) 5 MG tablet TAKE 1 TABLET(5 MG) BY MOUTH DAILY  . calcium-vitamin D (OSCAL WITH D) 500-200 MG-UNIT tablet Take 1 tablet by mouth 2 (two) times daily.  Marland Kitchen letrozole (FEMARA) 2.5 MG tablet Take 1 tablet (2.5 mg total) by mouth daily.  Marland Kitchen loratadine (CLARITIN) 10 MG tablet Take 10 mg by mouth daily.  Marland Kitchen tiZANidine (ZANAFLEX) 4 MG tablet Take 1 tablet (4 mg total) by mouth every 8 (eight) hours as needed for muscle spasms. (Patient not taking: Reported on 01/18/2021)   No facility-administered encounter medications on file as of 01/18/2021.    Allergies (verified) Patient has no known allergies.   History: Past Medical History:  Diagnosis Date  . Arthritis   . Breast cancer ([place])   . Gallstones 05/31/2018  . GERD (gastroesophageal reflux disease)   . PONV (postoperative nausea and vomiting)   . Syphilis    Past Surgical History:  Procedure Laterality Date  . BREAST  LUMPECTOMY WITH RADIOACTIVE SEED AND SENTINEL LYMPH NODE BIOPSY Right 09/15/2019   Procedure: RIGHT BREAST LUMPECTOMY WITH RADIOACTIVE SEED AND SENTINEL LYMPH NODE BIOPSY;  Surgeon: Donnie Mesa, MD;  Location: [facility];  Service: General;  Laterality: Right;  . CHOLECYSTECTOMY N/A 04/13/2019   Procedure: LAPAROSCOPIC CHOLECYSTECTOMY WITH INTRAOPERATIVE CHOLANGIOGRAM;  Surgeon: Donnie Mesa, MD;  Location: [facility];  Service: General;  Laterality: N/A;   Family History  Problem Relation Age of Onset  . Arthritis Mother   . Hearing loss Mother   . Heart disease Father   . Arthritis Sister    Social History   Socioeconomic History  . Marital status: Divorced    Spouse name: Not on file  . Number of children: Not on file  . Years of education: Not on file  . Highest education level: Not on file  Occupational History  . Not on file  Tobacco Use  . Smoking status: Former Smoker    Packs/day: 0.50    Quit date: 11/29/2019    Years since quitting: 1.1  . Smokeless tobacco: Never Used  Vaping Use  . Vaping Use: Never used  Substance and Sexual Activity  . Alcohol use: Yes    Comment: rarely  . Drug use: Never  . Sexual activity: Not on file  Other Topics Concern  . Not on file  Social History Narrative  . Not on file   Social Determinants of Health   Financial Resource Strain: Not  on file  Food Insecurity: Not on file  Transportation Needs: Not on file  Physical Activity: Not on file  Stress: Not on file  Social Connections: Not on file    Tobacco Counseling Counseling given: Not Answered   Clinical Intake:  Pre-visit preparation completed: Yes  Pain : No/denies pain     Nutritional Status: BMI > 30  Obese Nutritional Risks: None Diabetes: No  How often do you need to have someone help you when you read instructions, pamphlets, or other written materials from your doctor or pharmacy?: 1 - Never  Diabetic?No  Interpreter Needed?:  No  Information entered by :: Kelly Hamman LPN   Activities of Daily Living In your present state of health, do you have any difficulty performing the following activities: 01/18/2021  Hearing? N  Vision? N  Difficulty concentrating or making decisions? N  Walking or climbing stairs? N  Dressing or bathing? N  Doing errands, shopping? N  Preparing Food and eating ? N  Using the Toilet? N  In the past six months, have you accidently leaked urine? Y  Comment occasionally  Do you have problems with loss of bowel control? N  Managing your Medications? N  Managing your Finances? N  Housekeeping or managing your Housekeeping? N  Some recent data might be hidden    Patient Care Team: Shelda Pal, DO as PCP - General (Family Medicine) Donnie Mesa, MD as Consulting Physician (General Surgery) Nicholas Lose, MD as Consulting Physician (Hematology and Oncology) Eppie Gibson, MD as Attending Physician (Radiation Oncology)  Indicate any recent Medical Services you may have received from other than [facility] providers in the past year (date may be approximate).     Assessment:   This is a routine wellness examination for Kelly Dixon.  Hearing/Vision screen  Hearing Screening   125Hz  250Hz  500Hz  1000Hz  2000Hz  3000Hz  4000Hz  6000Hz  8000Hz   Right ear:           Left ear:           Comments: C/o mild hearing loss  Vision Screening Comments: Wears glasses Last eye exam-2020-[facility]   Dietary issues and exercise activities discussed: Current Exercise Habits: The patient does not participate in regular exercise at present, Exercise limited by: None identified  Goals    . Increase physical activity    . Patient Stated     Would like to lose some weight      Depression Screen PHQ 2/9 Scores 01/18/2021 12/30/2019 12/28/2018  PHQ - 2 Score 1 0 0    Fall Risk Fall Risk  01/18/2021 12/30/2019 12/28/2018  Falls in the past year? 0 0 0  Number falls in past yr: 0 0 -  Injury with  Fall? 0 0 -  Follow up Falls prevention discussed Education provided;Falls prevention discussed -    FALL RISK PREVENTION PERTAINING TO THE HOME:  Any stairs in or around the home? Yes  If so, are there any without handrails? No  Home free of loose throw rugs in walkways, pet beds, electrical cords, etc? Yes  Adequate lighting in your home to reduce risk of falls? Yes   ASSISTIVE DEVICES UTILIZED TO PREVENT FALLS:  Life alert? No  Use of a cane, walker or w/c? No  Grab bars in the bathroom? Yes  Shower chair or bench in shower? No  Elevated toilet seat or a handicapped toilet? No   TIMED UP AND GO:  Was the test performed? Yes .  Length of time to ambulate  10 feet: 10 sec.   Gait slow and steady without use of assistive device  Cognitive Function:Normal cognitive status assessed by direct observation by this Nurse Health Advisor. No abnormalities found.       6CIT Screen 01/18/2021  What Year? 0 points  What month? 0 points  What time? 0 points  Count back from 20 0 points  Months in reverse 0 points  Repeat phrase 0 points  Total Score 0    Immunizations Immunization History  Administered Date(s) Administered  . PFIZER(Purple Top)SARS-COV-2 Vaccination 11/25/2019, 12/15/2019, 07/24/2020  . PPD Test 06/24/2018  . Pneumococcal Conjugate-13 05/27/2018  . Tdap 05/27/2018    TDAP status: Up to date  Flu Vaccine status: Due, Education has been provided regarding the importance of this vaccine. Advised may receive this vaccine at local pharmacy or Health Dept. Aware to provide a copy of the vaccination record if obtained from local pharmacy or Health Dept. Verbalized acceptance and understanding.  Pneumococcal vaccine status: Due, Education has been provided regarding the importance of this vaccine. Advised may receive this vaccine at local pharmacy or Health Dept. Aware to provide a copy of the vaccination record if obtained from local pharmacy or Health Dept.  Verbalized acceptance and understanding.  Covid-19 vaccine status: Completed vaccines  Qualifies for Shingles Vaccine? Yes   Zostavax completed No   Shingrix Completed?: No.    Education has been provided regarding the importance of this vaccine. Patient has been advised to call insurance company to determine out of pocket expense if they have not yet received this vaccine. Advised may also receive vaccine at local pharmacy or Health Dept. Verbalized acceptance and understanding.  Screening Tests Health Maintenance  Topic Date Due  . PNA vac Low Risk Adult (2 of 2 - PPSV23) 05/28/2019  . COVID-19 Vaccine (4 - Booster for Pfizer series) 01/22/2021  . INFLUENZA VACCINE  04/30/2021  . MAMMOGRAM  09/13/2022  . TETANUS/TDAP  05/27/2028  . COLONOSCOPY (Pts 45-65yrs Insurance coverage will need to be confirmed)  06/29/2028  . DEXA SCAN  Completed  . Hepatitis C Screening  Completed  . HPV VACCINES  Aged Out    Health Maintenance  Health Maintenance Due  Topic Date Due  . PNA vac Low Risk Adult (2 of 2 - PPSV23) 05/28/2019  . COVID-19 Vaccine (4 - Booster for Pfizer series) 01/22/2021    Colorectal cancer screening: Type of screening: Cologuard. Completed 06/29/2018. Repeat every 3 years  Mammogram status: Completed Bilateral 09/13/2020. Repeat every year  Bone Density status: Completed 08/01/2020. Results reflect: Bone density results: OSTEOPENIA. Repeat every 2 years.  Lung Cancer Screening: (Low Dose CT Chest recommended if Age 70-80 years, 30 pack-year currently smoking OR have quit w/in 15years.) does not qualify.    Additional Screening:  Hepatitis C Screening:Completed 05/27/2018  Vision Screening: Recommended annual ophthalmology exams for early detection of glaucoma and other disorders of the eye. Is the patient up to date with their annual eye exam?  No  Who is the provider or what is the name of the office in which the patient attends annual eye exams? [facility] Patient plans to make an appt.  Dental Screening: Recommended annual dental exams for proper oral hygiene  Community Resource Referral / Chronic Care Management: CRR required this visit?  No   CCM required this visit?  No      Plan:     I have personally reviewed and noted the following in the patient's chart:   .  Medical and social history . Use of alcohol, tobacco or illicit drugs  . Current medications and supplements . Functional ability and status . Nutritional status . Physical activity . Advanced directives . List of other physicians . Hospitalizations, surgeries, and ER visits in previous 12 months . Vitals . Screenings to include cognitive, depression, and falls . Referrals and appointments  In addition, I have reviewed and discussed with patient certain preventive protocols, quality metrics, and best practice recommendations. A written personalized care plan for preventive services as well as general preventive health recommendations were provided to patient.     Marta Antu, LPN   [id number]  Nurse Health Advisor  Nurse Notes: None

## 2021-01-18 ENCOUNTER — Other Ambulatory Visit: Payer: Self-pay

## 2021-01-18 ENCOUNTER — Ambulatory Visit (INDEPENDENT_AMBULATORY_CARE_PROVIDER_SITE_OTHER): Payer: Medicare HMO

## 2021-01-18 ENCOUNTER — Ambulatory Visit: Payer: Medicare HMO | Attending: Internal Medicine

## 2021-01-18 VITALS — BP 138/96 | HR 77 | Temp 98.1°F | Resp 16 | Ht 62.0 in | Wt 210.6 lb

## 2021-01-18 DIAGNOSIS — Z0000 Encounter for general adult medical examination without abnormal findings: Secondary | ICD-10-CM

## 2021-01-18 DIAGNOSIS — Z23 Encounter for immunization: Secondary | ICD-10-CM

## 2021-01-18 NOTE — Patient Instructions (Signed)
[name] , Thank you for taking time to come for your Medicare Wellness Visit. I appreciate your ongoing commitment to your health goals. Please review the following plan we discussed and let me know if I can assist you in the future.   Screening recommendations/referrals: Colonoscopy: Completed Cologuard 06/29/2018-Due 06/29/2021 Mammogram: Completed 09/13/2020-Due 09/13/2021 Bone Density: Completed 08/01/2020-Due 08/01/2022 Recommended yearly ophthalmology/optometry visit for glaucoma screening and checkup Recommended yearly dental visit for hygiene and checkup  Vaccinations: Influenza vaccine: Due-05/2021 Pneumococcal vaccine: Pneumovax-23 due-May obtain vaccine at our office or your local pharmacy Tdap vaccine: Up to date-Due-05/27/2028 Shingles vaccine: Discuss with pharmacy   Covid-19:Completed 3 doses.  Advanced directives: Information given today  Conditions/risks identified: See problem list  Next appointment: Follow up in one year for your annual wellness visit 01/24/2022 @ 8:20   Preventive Care 65 Years and Older, Female Preventive care refers to lifestyle choices and visits with your health care provider that can promote health and wellness. What does preventive care include?  A yearly physical exam. This is also called an annual well check.  Dental exams once or twice a year.  Routine eye exams. Ask your health care provider how often you should have your eyes checked.  Personal lifestyle choices, including:  Daily care of your teeth and gums.  Regular physical activity.  Eating a healthy diet.  Avoiding tobacco and drug use.  Limiting alcohol use.  Practicing safe sex.  Taking low-dose aspirin every day.  Taking vitamin and mineral supplements as recommended by your health care provider. What happens during an annual well check? The services and screenings done by your health care provider during your annual well check will depend on your age, overall  health, lifestyle risk factors, and family history of disease. Counseling  Your health care provider may ask you questions about your:  Alcohol use.  Tobacco use.  Drug use.  Emotional well-being.  Home and relationship well-being.  Sexual activity.  Eating habits.  History of falls.  Memory and ability to understand (cognition).  Work and work Statistician.  Reproductive health. Screening  You may have the following tests or measurements:  Height, weight, and BMI.  Blood pressure.  Lipid and cholesterol levels. These may be checked every 5 years, or more frequently if you are over 51 years old.  Skin check.  Lung cancer screening. You may have this screening every year starting at age 7 if you have a 30-pack-year history of smoking and currently smoke or have quit within the past 15 years.  Fecal occult blood test (FOBT) of the stool. You may have this test every year starting at age 38.  Flexible sigmoidoscopy or colonoscopy. You may have a sigmoidoscopy every 5 years or a colonoscopy every 10 years starting at age 63.  Hepatitis C blood test.  Hepatitis B blood test.  Sexually transmitted disease (STD) testing.  Diabetes screening. This is done by checking your blood sugar (glucose) after you have not eaten for a while (fasting). You may have this done every 1-3 years.  Bone density scan. This is done to screen for osteoporosis. You may have this done starting at age 75.  Mammogram. This may be done every 1-2 years. Talk to your health care provider about how often you should have regular mammograms. Talk with your health care provider about your test results, treatment options, and if necessary, the need for more tests. Vaccines  Your health care provider may recommend certain vaccines, such as:  Influenza vaccine.  This is recommended every year.  Tetanus, diphtheria, and acellular pertussis (Tdap, Td) vaccine. You may need a Td booster every 10  years.  Zoster vaccine. You may need this after age 101.  Pneumococcal 13-valent conjugate (PCV13) vaccine. One dose is recommended after age 11.  Pneumococcal polysaccharide (PPSV23) vaccine. One dose is recommended after age 26. Talk to your health care provider about which screenings and vaccines you need and how often you need them. This information is not intended to replace advice given to you by your health care provider. Make sure you discuss any questions you have with your health care provider. Document Released: 10/13/2015 Document Revised: 06/05/2016 Document Reviewed: 07/18/2015 Elsevier Interactive Patient Education  2017 [employer] Prevention in the Home Falls can cause injuries. They can happen to people of all ages. There are many things you can do to make your home safe and to help prevent falls. What can I do on the outside of my home?  Regularly fix the edges of walkways and driveways and fix any cracks.  Remove anything that might make you trip as you walk through a door, such as a raised step or threshold.  Trim any bushes or trees on the path to your home.  Use bright outdoor lighting.  Clear any walking paths of anything that might make someone trip, such as rocks or tools.  Regularly check to see if handrails are loose or broken. Make sure that both sides of any steps have handrails.  Any raised decks and porches should have guardrails on the edges.  Have any leaves, snow, or ice cleared regularly.  Use sand or salt on walking paths during winter.  Clean up any spills in your garage right away. This includes oil or grease spills. What can I do in the bathroom?  Use night lights.  Install grab bars by the toilet and in the tub and shower. Do not use towel bars as grab bars.  Use non-skid mats or decals in the tub or shower.  If you need to sit down in the shower, use a plastic, non-slip stool.  Keep the floor dry. Clean up any water that  spills on the floor as soon as it happens.  Remove soap buildup in the tub or shower regularly.  Attach bath mats securely with double-sided non-slip rug tape.  Do not have throw rugs and other things on the floor that can make you trip. What can I do in the bedroom?  Use night lights.  Make sure that you have a light by your bed that is easy to reach.  Do not use any sheets or blankets that are too big for your bed. They should not hang down onto the floor.  Have a firm chair that has side arms. You can use this for support while you get dressed.  Do not have throw rugs and other things on the floor that can make you trip. What can I do in the kitchen?  Clean up any spills right away.  Avoid walking on wet floors.  Keep items that you use a lot in easy-to-reach places.  If you need to reach something above you, use a strong step stool that has a grab bar.  Keep electrical cords out of the way.  Do not use floor polish or wax that makes floors slippery. If you must use wax, use non-skid floor wax.  Do not have throw rugs and other things on the floor that can make  you trip. What can I do with my stairs?  Do not leave any items on the stairs.  Make sure that there are handrails on both sides of the stairs and use them. Fix handrails that are broken or loose. Make sure that handrails are as long as the stairways.  Check any carpeting to make sure that it is firmly attached to the stairs. Fix any carpet that is loose or worn.  Avoid having throw rugs at the top or bottom of the stairs. If you do have throw rugs, attach them to the floor with carpet tape.  Make sure that you have a light switch at the top of the stairs and the bottom of the stairs. If you do not have them, ask someone to add them for you. What else can I do to help prevent falls?  Wear shoes that:  Do not have high heels.  Have rubber bottoms.  Are comfortable and fit you well.  Are closed at the  toe. Do not wear sandals.  If you use a stepladder:  Make sure that it is fully opened. Do not climb a closed stepladder.  Make sure that both sides of the stepladder are locked into place.  Ask someone to hold it for you, if possible.  Clearly mark and make sure that you can see:  Any grab bars or handrails.  First and last steps.  Where the edge of each step is.  Use tools that help you move around (mobility aids) if they are needed. These include:  Canes.  Walkers.  Scooters.  Crutches.  Turn on the lights when you go into a dark area. Replace any light bulbs as soon as they burn out.  Set up your furniture so you have a clear path. Avoid moving your furniture around.  If any of your floors are uneven, fix them.  If there are any pets around you, be aware of where they are.  Review your medicines with your doctor. Some medicines can make you feel dizzy. This can increase your chance of falling. Ask your doctor what other things that you can do to help prevent falls. This information is not intended to replace advice given to you by your health care provider. Make sure you discuss any questions you have with your health care provider. Document Released: 07/13/2009 Document Revised: 02/22/2016 Document Reviewed: 10/21/2014 Elsevier Interactive Patient Education  2017 [employer].

## 2021-01-18 NOTE — Progress Notes (Signed)
   Covid-19 Vaccination Clinic  Name:  Kelly Dixon    MRN: [id number] DOB: 07/03/1946  01/18/2021  [name] was observed post Covid-19 immunization for 15 minutes without incident. She was provided with Vaccine Information Sheet and instruction to access the V-Safe system.   Ms. Salmon was instructed to call 911 with any severe reactions post vaccine: Marland Kitchen Difficulty breathing  . Swelling of face and throat  . A fast heartbeat  . A bad rash all over body  . Dizziness and weakness   Immunizations Administered    Name Date Dose VIS Date Route   PFIZER Comrnaty(Gray TOP) Covid-19 Vaccine 01/18/2021 12:56 PM 0.3 mL 09/07/2020 Intramuscular   Manufacturer: [place]   Lot: [id number]   NDC: [phone]

## 2021-01-19 IMAGING — MG MM PLC BREAST LOC DEV 1ST LESION INC*R*
8 series · 8 of 8 positions shown · non-contrast
Comparison: Previous exam(s).

CLINICAL DATA: Patient presents for seed localization prior to
lumpectomy for grade 2 invasive ductal carcinoma in the RIGHT
breast.

EXAM:
MAMMOGRAPHIC GUIDED RADIOACTIVE SEED LOCALIZATION OF THE RIGHT
BREAST

[R LM (1 of 3)]
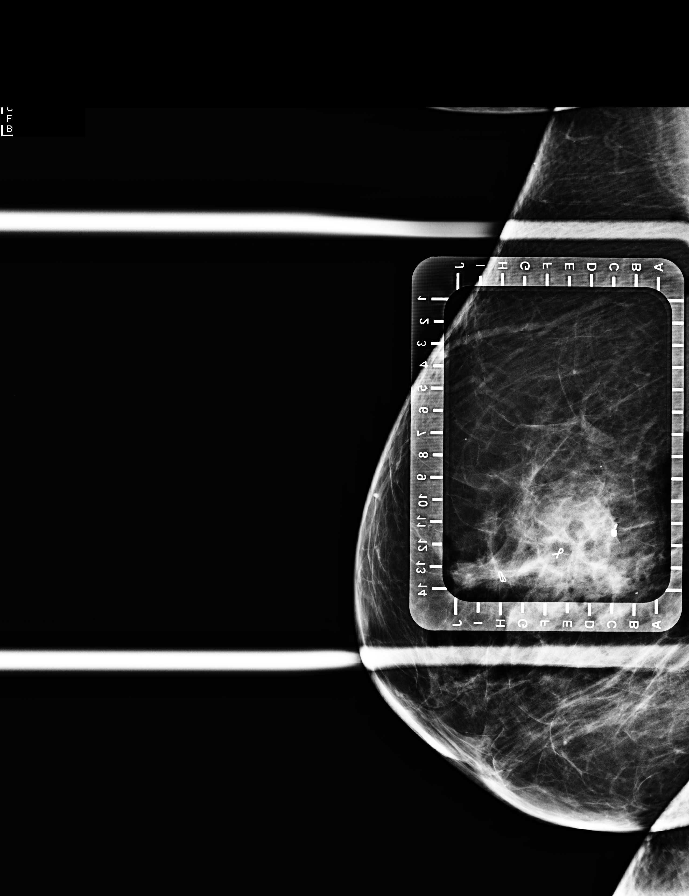

[R LM (2 of 3)]
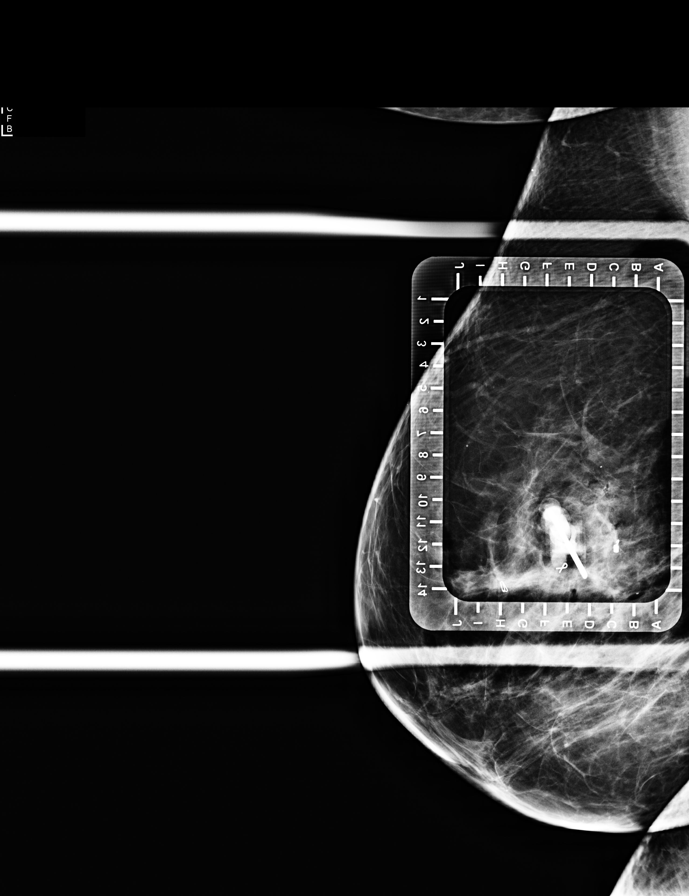

[R CC (1 of 4)]
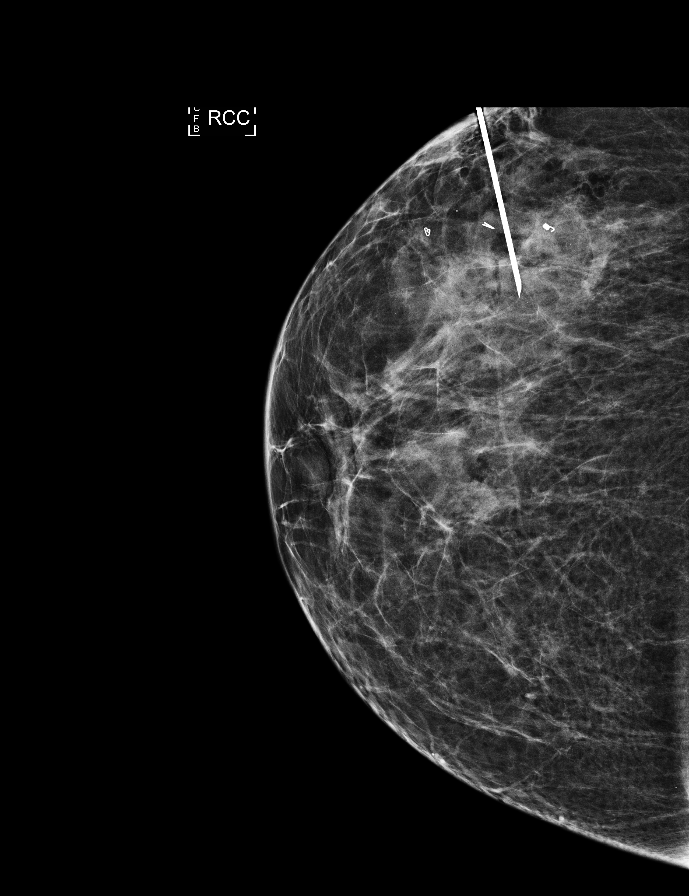

[R ML]
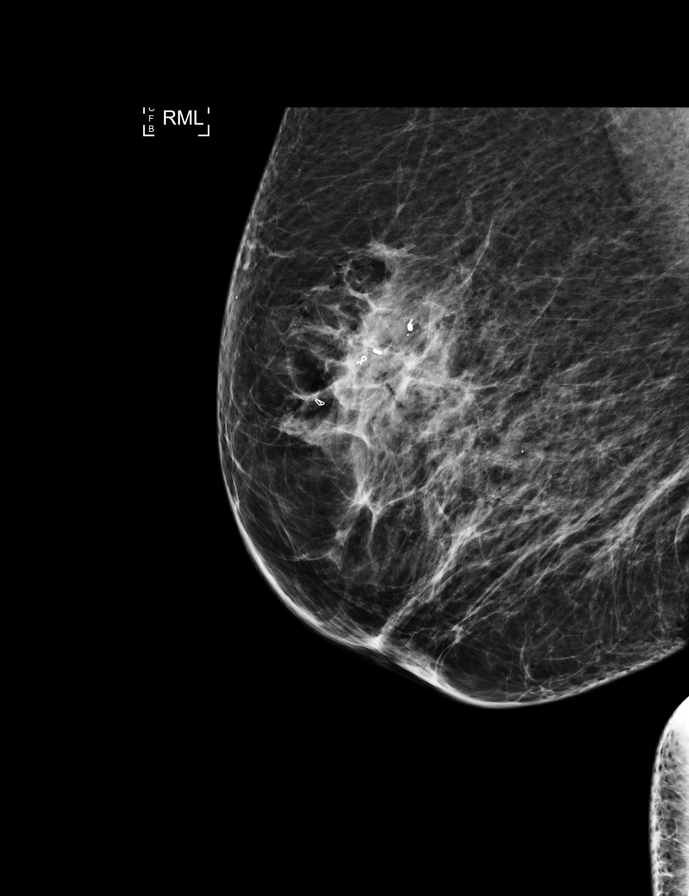

[R CC (2 of 4)]
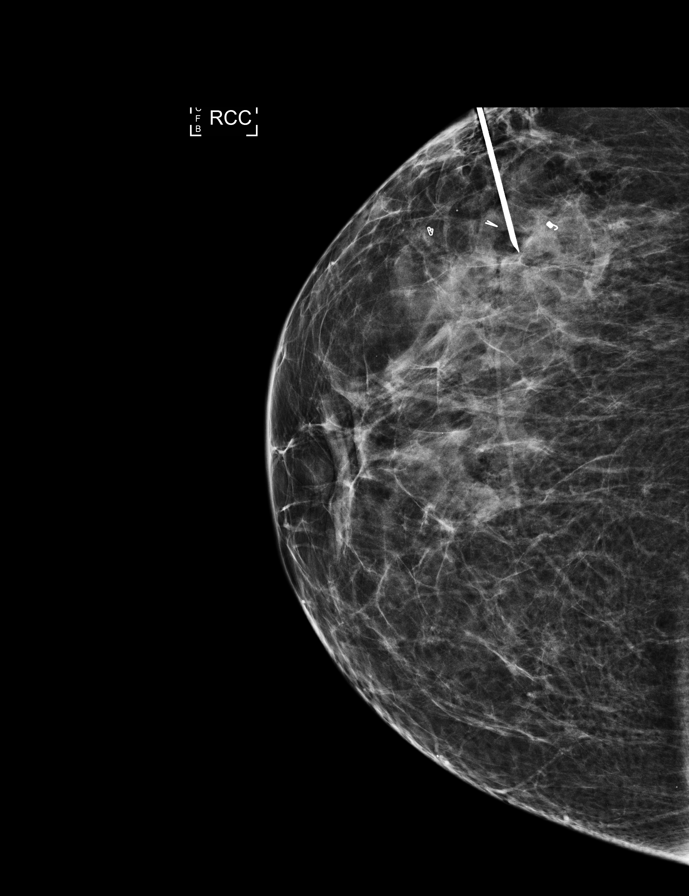

[R CC (3 of 4)]
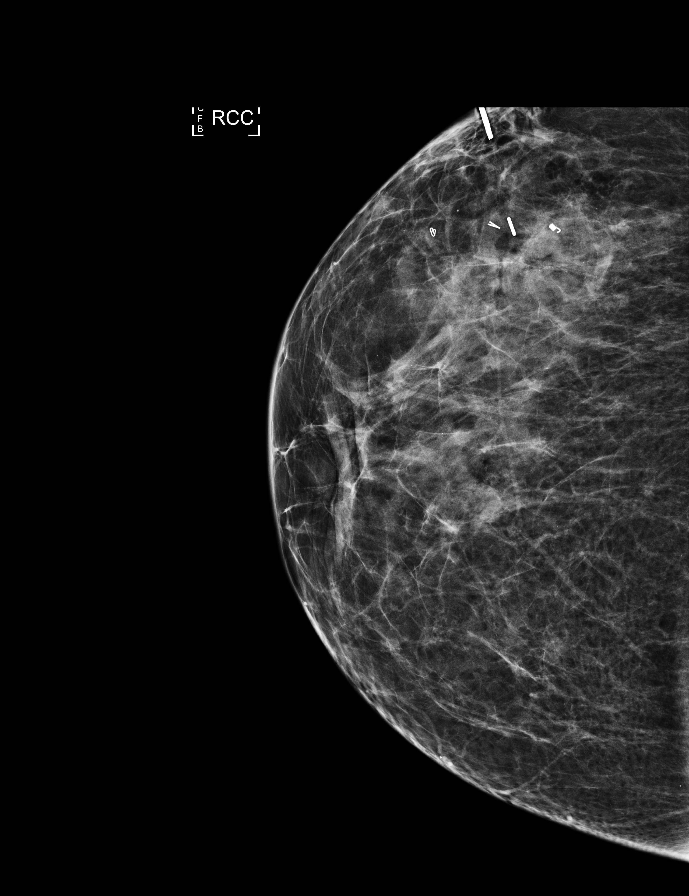

[R CC (4 of 4)]
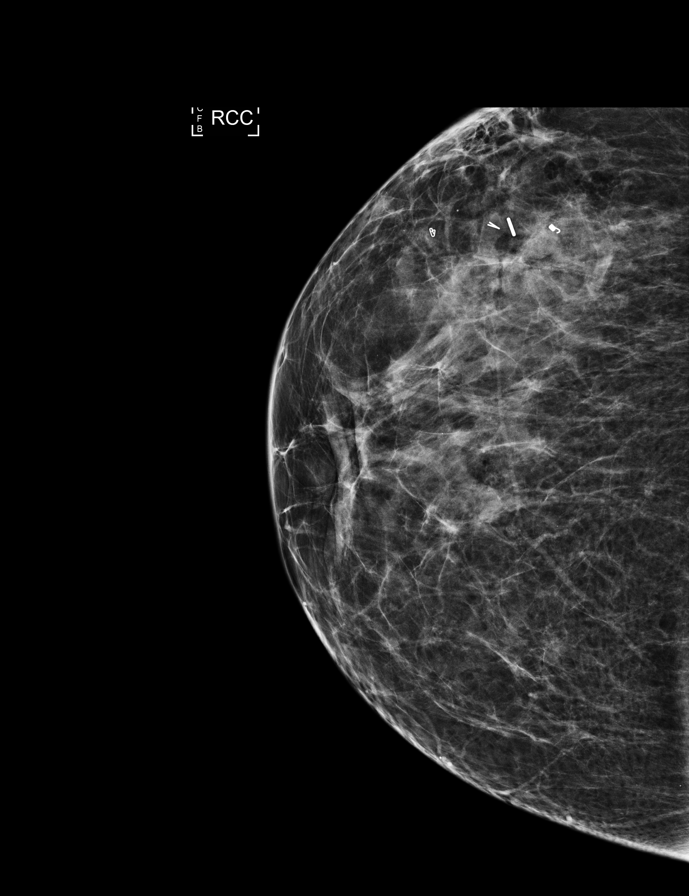

[R LM (3 of 3)]
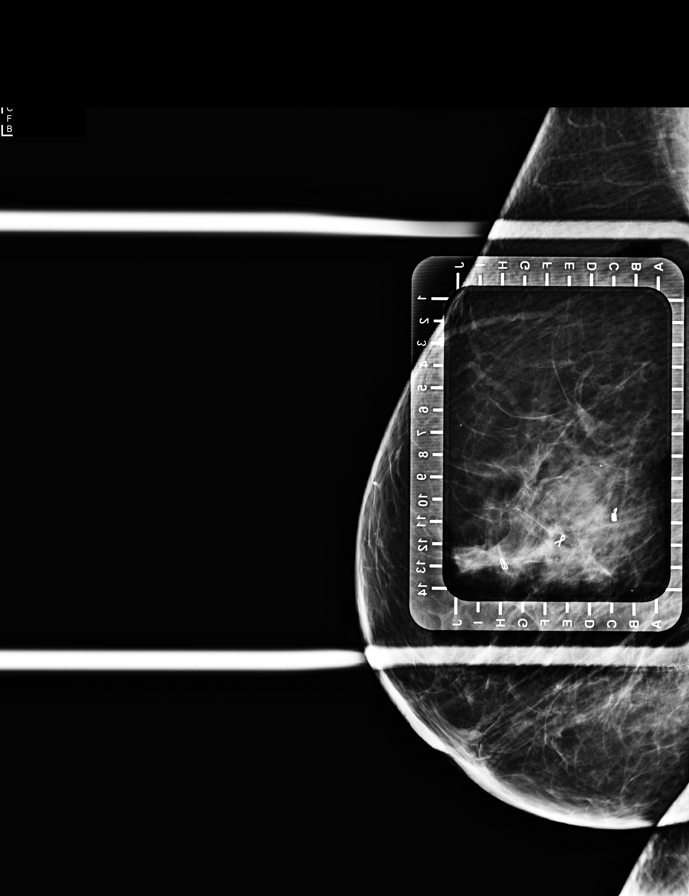

[8 of 8 positions shown; findings below may reference images not displayed]

FINDINGS: Patient presents for radioactive seed localization prior to
lumpectomy. I met with the patient and we discussed the procedure of
seed localization including benefits and alternatives. We discussed
the high likelihood of a successful procedure. We discussed the
risks of the procedure including infection, bleeding, tissue injury
and further surgery. We discussed the low dose of radioactivity
involved in the procedure. Informed, written consent was given.

The usual time-out protocol was performed immediately prior to the
procedure.

Using mammographic guidance, sterile technique, 1% lidocaine and an
I-[id number] radioactive seed, the ribbon shaped clip in the UPPER-OUTER
QUADRANT of the RIGHT breast was localized using a LATERAL to MEDIAL
approach. The follow-up mammogram images confirm the seed in the
expected location and were marked for Dr. Vedro.

Follow-up survey of the patient confirms presence of the radioactive
seed.

Order number of I-[id number] seed:  [id number] .

Total activity:  0.246 millicuries reference Date: 08/30/2019

The patient tolerated the procedure well and was released from the
[REDACTED]. She was given instructions regarding seed removal.
IMPRESSION: Radioactive seed localization right breast. No apparent
complications.

## 2021-01-22 ENCOUNTER — Other Ambulatory Visit (HOSPITAL_BASED_OUTPATIENT_CLINIC_OR_DEPARTMENT_OTHER): Payer: Self-pay

## 2021-01-22 MED ORDER — PFIZER-BIONT COVID-19 VAC-TRIS 30 MCG/0.3ML IM SUSP
INTRAMUSCULAR | 0 refills | Status: DC
  Filled 2021-01-22: qty 0.3, 1d supply, fill #0

## 2021-04-10 ENCOUNTER — Encounter: Payer: Medicare HMO | Admitting: Family Medicine

## 2021-04-26 DIAGNOSIS — H5213 Myopia, bilateral: Secondary | ICD-10-CM | POA: Diagnosis not present

## 2021-04-26 DIAGNOSIS — H52209 Unspecified astigmatism, unspecified eye: Secondary | ICD-10-CM | POA: Diagnosis not present

## 2021-05-09 ENCOUNTER — Encounter: Payer: Self-pay | Admitting: Family Medicine

## 2021-05-09 ENCOUNTER — Other Ambulatory Visit: Payer: Self-pay

## 2021-05-09 ENCOUNTER — Ambulatory Visit (INDEPENDENT_AMBULATORY_CARE_PROVIDER_SITE_OTHER): Payer: Medicare HMO | Admitting: Family Medicine

## 2021-05-09 VITALS — BP 112/78 | HR 73 | Temp 97.6°F | Ht 62.0 in | Wt 198.5 lb

## 2021-05-09 DIAGNOSIS — Z0000 Encounter for general adult medical examination without abnormal findings: Secondary | ICD-10-CM

## 2021-05-09 DIAGNOSIS — I10 Essential (primary) hypertension: Secondary | ICD-10-CM

## 2021-05-09 DIAGNOSIS — H9190 Unspecified hearing loss, unspecified ear: Secondary | ICD-10-CM

## 2021-05-09 LAB — COMPREHENSIVE METABOLIC PANEL
ALT: 7 U/L (ref 0–35)
AST: 10 U/L (ref 0–37)
Albumin: 4.4 g/dL (ref 3.5–5.2)
Alkaline Phosphatase: 60 U/L (ref 39–117)
BUN: 10 mg/dL (ref 6–23)
CO2: 25 mEq/L (ref 19–32)
Calcium: 9.2 mg/dL (ref 8.4–10.5)
Chloride: 107 mEq/L (ref 96–112)
Creatinine, Ser: 0.78 mg/dL (ref 0.40–1.20)
GFR: 75.69 mL/min (ref >60.00)
Glucose, Bld: 99 mg/dL (ref 70–99)
Potassium: 3.9 mEq/L (ref 3.5–5.1)
Sodium: 142 mEq/L (ref 135–145)
Total Bilirubin: 0.5 mg/dL (ref 0.2–1.2)
Total Protein: 7 g/dL (ref 6.0–8.3)

## 2021-05-09 LAB — LIPID PANEL
Cholesterol: 165 mg/dL (ref 0–200)
HDL: 54 mg/dL (ref >39.00)
LDL Cholesterol: 87 mg/dL (ref 0–99)
NonHDL: 110.74
Total CHOL/HDL Ratio: 3
Triglycerides: 117 mg/dL (ref 0.0–149.0)
VLDL: 23.4 mg/dL (ref 0.0–40.0)

## 2021-05-09 NOTE — Patient Instructions (Addendum)
Give [place] 2-3 business days to get the results of your labs back.   Keep the diet clean and stay active.  Contact your insurance company to see if they cover the PCV20.   The new Shingrix vaccine (for shingles) is a 2 shot series. It can make people feel low energy, achy and almost like they have the flu for 48 hours after injection. Please plan accordingly when deciding on when to get this shot. Call your pharmacy (and make sure it is covered by insurance) for an appointment to get this. The second shot of the series is less severe regarding the side effects, but it still lasts 48 hours.   I recommend getting the flu shot in mid October. This suggestion would change if the CDC comes out with a different recommendation.   Let us know if you need anything.

## 2021-05-09 NOTE — Progress Notes (Signed)
Chief Complaint  Patient presents with   Annual Exam     Well Woman [name] is here for a complete physical.   Her last physical was >1 year ago.  Current diet: in general, a "healthy" diet. Current exercise: active taking care of her sister Weight is decreased from taking care of sister and she denies daytime fatigue. Seatbelt? Yes  Health Maintenance Colonoscopy- Yes Shingrix- No DEXA- Yes Mammogram- Yes Tetanus- Yes Pneumonia- Due for PCV20, will ck w insurance Hep C screen- Yes  Past Medical History:  Diagnosis Date   Arthritis    Breast cancer ([place])    Gallstones 05/31/2018   GERD (gastroesophageal reflux disease)    PONV (postoperative nausea and vomiting)    Syphilis      Past Surgical History:  Procedure Laterality Date   BREAST LUMPECTOMY WITH RADIOACTIVE SEED AND SENTINEL LYMPH NODE BIOPSY Right 09/15/2019   Procedure: RIGHT BREAST LUMPECTOMY WITH RADIOACTIVE SEED AND SENTINEL LYMPH NODE BIOPSY;  Surgeon: Donnie Mesa, MD;  Location: [facility];  Service: General;  Laterality: Right;   CHOLECYSTECTOMY N/A 04/13/2019   Procedure: LAPAROSCOPIC CHOLECYSTECTOMY WITH INTRAOPERATIVE CHOLANGIOGRAM;  Surgeon: Donnie Mesa, MD;  Location: [facility];  Service: General;  Laterality: N/A;    Medications  Current Outpatient Medications on File Prior to Visit  Medication Sig Dispense Refill   amLODipine (NORVASC) 5 MG tablet TAKE 1 TABLET(5 MG) BY MOUTH DAILY 30 tablet 3   calcium-vitamin D (OSCAL WITH D) 500-200 MG-UNIT tablet Take 1 tablet by mouth 2 (two) times daily. 180 tablet 3   COVID-19 mRNA Vac-TriS, Pfizer, (PFIZER-BIONT COVID-19 VAC-TRIS) SUSP injection Inject into the muscle. 0.3 mL 0   letrozole (FEMARA) 2.5 MG tablet Take 1 tablet (2.5 mg total) by mouth daily. 90 tablet 3   loratadine (CLARITIN) 10 MG tablet Take 10 mg by mouth daily.     tiZANidine (ZANAFLEX) 4 MG tablet Take 1 tablet (4 mg total) by mouth every 8 (eight) hours as needed  for muscle spasms. 30 tablet 0    Allergies No Known Allergies  Review of Systems: Constitutional:  no fevers Eye:  no recent significant change in vision Ears:  No changes in hearing Nose/Mouth/Throat:  no complaints of nasal congestion, no sore throat Cardiovascular: no chest pain Respiratory:  No shortness of breath Gastrointestinal:  No change in bowel habits GU:  Female: negative for dysuria Integumentary:  no abnormal skin lesions reported Neurologic:  no headaches Endocrine:  denies unexplained weight changes  Exam BP 112/78   Pulse 73   Temp 97.6 F (36.4 C) (Oral)   Ht '5\' 2"'$  (1.575 m)   Wt 198 lb 8 oz (90 kg)   SpO2 99%   BMI 36.31 kg/m  General:  well developed, well nourished, in no apparent distress Skin:  no significant moles, warts, or growths Head:  no masses, lesions, or tenderness Eyes:  pupils equal and round, sclera anicteric without injection Ears:  canals without lesions, TMs shiny without retraction, no obvious effusion, no erythema Nose:  nares patent, septum midline, mucosa normal, and no drainage or sinus tenderness Throat/Pharynx:  lips and gingiva without lesion; tongue and uvula midline; non-inflamed pharynx; no exudates or postnasal drainage Neck: neck supple without adenopathy, thyromegaly, or masses Lungs:  clear to auscultation, breath sounds equal bilaterally, no respiratory distress Cardio:  regular rate and rhythm, no bruits or LE edema Abdomen:  abdomen soft, nontender; bowel sounds normal; no masses or organomegaly Genital: Deferred Neuro:  gait normal; deep tendon reflexes normal and symmetric Psych: well oriented with normal range of affect and appropriate judgment/insight  Assessment and Plan  Well adult exam  Essential hypertension - Plan: Lipid panel, Comprehensive metabolic panel   Well 75 y.o. female. Counseled on diet and exercise. Shingrix rec'd, flu shot rec'd in Oct.  See if ins will cover PCV20.  Other orders as  above. Follow up in 6 mo. The patient voiced understanding and agreement to the plan.  [place], DO 05/09/21 8:51 AM

## 2021-05-09 NOTE — Addendum Note (Signed)
Addended by: Sharon Seller B on: 05/09/2021 09:45 AM   Modules accepted: Orders

## 2021-05-11 ENCOUNTER — Other Ambulatory Visit: Payer: Self-pay | Admitting: Family Medicine

## 2021-05-11 DIAGNOSIS — I10 Essential (primary) hypertension: Secondary | ICD-10-CM

## 2021-05-16 ENCOUNTER — Encounter: Payer: Self-pay | Admitting: Family Medicine

## 2021-05-16 DIAGNOSIS — H906 Mixed conductive and sensorineural hearing loss, bilateral: Secondary | ICD-10-CM | POA: Diagnosis not present

## 2021-05-16 DIAGNOSIS — Z570 Occupational exposure to noise: Secondary | ICD-10-CM | POA: Diagnosis not present

## 2021-05-16 DIAGNOSIS — Z822 Family history of deafness and hearing loss: Secondary | ICD-10-CM | POA: Diagnosis not present

## 2021-08-06 ENCOUNTER — Other Ambulatory Visit: Payer: Self-pay | Admitting: Adult Health

## 2021-08-06 DIAGNOSIS — Z853 Personal history of malignant neoplasm of breast: Secondary | ICD-10-CM

## 2021-09-07 ENCOUNTER — Other Ambulatory Visit: Payer: Self-pay | Admitting: Family Medicine

## 2021-09-07 DIAGNOSIS — I10 Essential (primary) hypertension: Secondary | ICD-10-CM

## 2021-09-19 ENCOUNTER — Inpatient Hospital Stay: Payer: Medicare HMO | Admitting: Hematology and Oncology

## 2021-09-20 ENCOUNTER — Ambulatory Visit
Admission: RE | Admit: 2021-09-20 | Discharge: 2021-09-20 | Disposition: A | Discharge status: Home / Self Care | Payer: Medicare HMO | Source: Ambulatory Visit | Attending: Adult Health | Admitting: Adult Health

## 2021-09-20 DIAGNOSIS — Z853 Personal history of malignant neoplasm of breast: Secondary | ICD-10-CM | POA: Diagnosis not present

## 2021-09-20 DIAGNOSIS — R922 Inconclusive mammogram: Secondary | ICD-10-CM | POA: Diagnosis not present

## 2021-09-20 HISTORY — DX: Personal history of irradiation: Z92.3

## 2021-10-15 ENCOUNTER — Encounter: Payer: Self-pay | Admitting: Family Medicine

## 2021-10-16 ENCOUNTER — Other Ambulatory Visit: Payer: Self-pay | Admitting: Family Medicine

## 2021-10-16 DIAGNOSIS — M25561 Pain in right knee: Secondary | ICD-10-CM

## 2021-10-16 DIAGNOSIS — G8929 Other chronic pain: Secondary | ICD-10-CM

## 2021-10-17 ENCOUNTER — Other Ambulatory Visit: Payer: Self-pay | Admitting: Family Medicine

## 2021-10-17 ENCOUNTER — Telehealth: Payer: Self-pay | Admitting: Family Medicine

## 2021-10-17 DIAGNOSIS — G8929 Other chronic pain: Secondary | ICD-10-CM

## 2021-10-17 DIAGNOSIS — M25562 Pain in left knee: Secondary | ICD-10-CM

## 2021-10-17 NOTE — Telephone Encounter (Signed)
done

## 2021-10-17 NOTE — Telephone Encounter (Signed)
Ortho Trauma Specialist called to let us know that the patient is being referred for knee pain and they only treat complex fractures. They recommend for patient to be referred to general ortho.

## 2021-10-17 NOTE — Telephone Encounter (Signed)
Ok to do

## 2021-11-30 ENCOUNTER — Encounter: Payer: Self-pay | Admitting: Family Medicine

## 2021-12-08 DIAGNOSIS — Z1211 Encounter for screening for malignant neoplasm of colon: Secondary | ICD-10-CM | POA: Diagnosis not present

## 2021-12-15 LAB — COLOGUARD: COLOGUARD: POSITIVE — AB

## 2021-12-17 ENCOUNTER — Encounter: Payer: Self-pay | Admitting: Gastroenterology

## 2021-12-17 ENCOUNTER — Other Ambulatory Visit: Payer: Self-pay | Admitting: Family Medicine

## 2021-12-17 DIAGNOSIS — R195 Other fecal abnormalities: Secondary | ICD-10-CM

## 2021-12-25 ENCOUNTER — Ambulatory Visit (AMBULATORY_SURGERY_CENTER): Payer: Medicare HMO

## 2021-12-25 ENCOUNTER — Other Ambulatory Visit: Payer: Self-pay

## 2021-12-25 VITALS — Ht 62.0 in | Wt 201.0 lb

## 2021-12-25 DIAGNOSIS — Z1211 Encounter for screening for malignant neoplasm of colon: Secondary | ICD-10-CM

## 2021-12-25 DIAGNOSIS — R195 Other fecal abnormalities: Secondary | ICD-10-CM

## 2021-12-25 NOTE — Progress Notes (Signed)

## 2021-12-31 ENCOUNTER — Encounter: Payer: Self-pay | Admitting: Gastroenterology

## 2022-01-02 ENCOUNTER — Telehealth: Payer: Self-pay | Admitting: Gastroenterology

## 2022-01-02 ENCOUNTER — Other Ambulatory Visit: Payer: Self-pay | Admitting: Family Medicine

## 2022-01-02 DIAGNOSIS — I10 Essential (primary) hypertension: Secondary | ICD-10-CM

## 2022-01-02 NOTE — Telephone Encounter (Signed)
Spoke with pt and told her ok to continue Protein shakes ? ?

## 2022-01-02 NOTE — Telephone Encounter (Signed)
Patient has an upcoming procedure and starts her "5 days prior" tomorrow.  She recently started taking Premier Protein to help with losing weight.  It has 2.5 mg of iron per serving and she drinks 2 a day.  She wants to know if she should stop these.  Please call patient and advise.  Thank you. ?

## 2022-01-08 ENCOUNTER — Encounter: Payer: Self-pay | Admitting: Gastroenterology

## 2022-01-08 ENCOUNTER — Ambulatory Visit (AMBULATORY_SURGERY_CENTER): Payer: Medicare HMO | Admitting: Gastroenterology

## 2022-01-08 VITALS — BP 116/71 | HR 66 | Temp 98.4°F | Resp 14 | Ht 62.0 in | Wt 201.0 lb

## 2022-01-08 DIAGNOSIS — Z1211 Encounter for screening for malignant neoplasm of colon: Secondary | ICD-10-CM | POA: Diagnosis not present

## 2022-01-08 DIAGNOSIS — I10 Essential (primary) hypertension: Secondary | ICD-10-CM | POA: Diagnosis not present

## 2022-01-08 DIAGNOSIS — Z862 Personal history of diseases of the blood and blood-forming organs and certain disorders involving the immune mechanism: Secondary | ICD-10-CM | POA: Diagnosis not present

## 2022-01-08 DIAGNOSIS — R195 Other fecal abnormalities: Secondary | ICD-10-CM

## 2022-01-08 DIAGNOSIS — D128 Benign neoplasm of rectum: Secondary | ICD-10-CM

## 2022-01-08 MED ORDER — SODIUM CHLORIDE 0.9 % IV SOLN: 500.0000 mL | Freq: Once | INTRAVENOUS | Status: DC

## 2022-01-08 NOTE — Progress Notes (Signed)
Pt awake, report to RN, VVS  °

## 2022-01-08 NOTE — Progress Notes (Signed)
Called to room to assist during endoscopic procedure.  Patient ID and intended procedure confirmed with present staff. Received instructions for my participation in the procedure from the performing physician.  

## 2022-01-08 NOTE — Progress Notes (Signed)
? ?GASTROENTEROLOGY PROCEDURE H&P NOTE  ? ?Primary Care Physician: ?Shelda Pal, DO ? ?HPI: ?Kelly Dixon is a 76 y.o. female who presents for Colonoscopy for positive Cologuard. ? ?Past Medical History:  ?Diagnosis Date  ? Arthritis   ? Breast cancer ([facility]) 2020  ? right breast  ? Gallstones 05/31/2018  ? GERD (gastroesophageal reflux disease)   ? Hypertension   ? Personal history of radiation therapy   ? PONV (postoperative nausea and vomiting)   ? Syphilis   ? ?Past Surgical History:  ?Procedure Laterality Date  ? BREAST LUMPECTOMY WITH RADIOACTIVE SEED AND SENTINEL LYMPH NODE BIOPSY Right 09/15/2019  ? Procedure: RIGHT BREAST LUMPECTOMY WITH RADIOACTIVE SEED AND SENTINEL LYMPH NODE BIOPSY;  Surgeon: Donnie Mesa, MD;  Location: [facility];  Service: General;  Laterality: Right;  ? CHOLECYSTECTOMY N/A 04/13/2019  ? Procedure: LAPAROSCOPIC CHOLECYSTECTOMY WITH INTRAOPERATIVE CHOLANGIOGRAM;  Surgeon: Donnie Mesa, MD;  Location: [facility];  Service: General;  Laterality: N/A;  ? ?Current Outpatient Medications  ?Medication Sig Dispense Refill  ? Acetaminophen (TYLENOL PO) Take by mouth.    ? amLODipine (NORVASC) 5 MG tablet TAKE 1 TABLET(5 MG) BY MOUTH DAILY 30 tablet 3  ? calcium-vitamin D (OSCAL WITH D) 500-200 MG-UNIT tablet Take 1 tablet by mouth 2 (two) times daily. (Patient not taking: Reported on 12/25/2021) 180 tablet 3  ? letrozole (FEMARA) 2.5 MG tablet Take 1 tablet (2.5 mg total) by mouth daily. (Patient not taking: Reported on 12/25/2021) 90 tablet 3  ? loratadine (CLARITIN) 10 MG tablet Take 10 mg by mouth daily. (Patient not taking: Reported on 12/25/2021)    ? NON FORMULARY dphenhydramine HO PO    ? ?No current facility-administered medications for this visit.  ? ? ?Current Outpatient Medications:  ?  Acetaminophen (TYLENOL PO), Take by mouth., Disp: , Rfl:  ?  amLODipine (NORVASC) 5 MG tablet, TAKE 1 TABLET(5 MG) BY MOUTH DAILY, Disp: 30 tablet, Rfl: 3 ?  calcium-vitamin D  (OSCAL WITH D) 500-200 MG-UNIT tablet, Take 1 tablet by mouth 2 (two) times daily. (Patient not taking: Reported on 12/25/2021), Disp: 180 tablet, Rfl: 3 ?  letrozole (FEMARA) 2.5 MG tablet, Take 1 tablet (2.5 mg total) by mouth daily. (Patient not taking: Reported on 12/25/2021), Disp: 90 tablet, Rfl: 3 ?  loratadine (CLARITIN) 10 MG tablet, Take 10 mg by mouth daily. (Patient not taking: Reported on 12/25/2021), Disp: , Rfl:  ?  NON FORMULARY, dphenhydramine HO PO, Disp: , Rfl:  ?No Known Allergies ?Family History  ?Problem Relation Age of Onset  ? Arthritis Mother   ? Hearing loss Mother   ? Heart disease Father   ? Arthritis Sister   ? Colon cancer Neg Hx   ? Colon polyps Neg Hx   ? Esophageal cancer Neg Hx   ? Rectal cancer Neg Hx   ? Stomach cancer Neg Hx   ? ?Social History  ? ?Socioeconomic History  ? Marital status: Divorced  ?  Spouse name: Not on file  ? Number of children: Not on file  ? Years of education: Not on file  ? Highest education level: Not on file  ?Occupational History  ? Not on file  ?Tobacco Use  ? Smoking status: Former  ?  Packs/day: 0.50  ?  Types: Cigarettes  ?  Quit date: 11/29/2019  ?  Years since quitting: 2.1  ? Smokeless tobacco: Never  ?Vaping Use  ? Vaping Use: Never used  ?Substance and Sexual Activity  ?  Alcohol use: Yes  ?  Comment: rarely  ? Drug use: Never  ? Sexual activity: Not on file  ?Other Topics Concern  ? Not on file  ?Social History Narrative  ? Not on file  ? ?Social Determinants of Health  ? ?Financial Resource Strain: Low Risk   ? Difficulty of Paying Living Expenses: Not hard at all  ?Food Insecurity: No Food Insecurity  ? Worried About Charity fundraiser in the Last Year: Never true  ? Ran Out of Food in the Last Year: Never true  ?Transportation Needs: No Transportation Needs  ? Lack of Transportation (Medical): No  ? Lack of Transportation (Non-Medical): No  ?Physical Activity: Inactive  ? Days of Exercise per Week: 0 days  ? Minutes of Exercise per Session: 0  min  ?Stress: No Stress Concern Present  ? Feeling of Stress : Not at all  ?Social Connections: Moderately Integrated  ? Frequency of Communication with Friends and Family: More than three times a week  ? Frequency of Social Gatherings with Friends and Family: More than three times a week  ? Attends Religious Services: More than 4 times per year  ? Active Member of Clubs or Organizations: Yes  ? Attends Archivist Meetings: More than 4 times per year  ? Marital Status: Divorced  ?Intimate Partner Violence: Not At Risk  ? Fear of Current or Ex-Partner: No  ? Emotionally Abused: No  ? Physically Abused: No  ? Sexually Abused: No  ? ? ?Physical Exam: ?There were no vitals filed for this visit. ?There is no height or weight on file to calculate BMI. ?GEN: NAD ?EYE: Sclerae anicteric ?ENT: MMM ?CV: Non-tachycardic ?GI: Soft, NT/ND ?NEURO:  Alert & Oriented x 3 ? ?Lab Results: ?No results for input(s): WBC, HGB, HCT, PLT in the last 72 hours. ?BMET ?No results for input(s): NA, K, CL, CO2, GLUCOSE, BUN, CREATININE, CALCIUM in the last 72 hours. ?LFT ?No results for input(s): PROT, ALBUMIN, AST, ALT, ALKPHOS, BILITOT, BILIDIR, IBILI in the last 72 hours. ?PT/INR ?No results for input(s): LABPROT, INR in the last 72 hours. ? ? ?Impression / Plan: ?This is a 76 y.o.female  who presents for Colonoscopy for positive Cologuard. ? ?The risks and benefits of endoscopic evaluation/treatment were discussed with the patient and/or family; these include but are not limited to the risk of perforation, infection, bleeding, missed lesions, lack of diagnosis, severe illness requiring hospitalization, as well as anesthesia and sedation related illnesses.  The patient's history has been reviewed, patient examined, no change in status, and deemed stable for procedure.  The patient and/or family is agreeable to proceed.  ? ? ?Justice Britain, MD ?[facility] ?Advanced Endoscopy ?Office # [phone] ? ?

## 2022-01-08 NOTE — Progress Notes (Signed)
VS-CW ? ?Pt's states no medical or surgical changes since previsit or office visit. ? ?Pt states she drank about 4oz of water at 1:45pm. MD and CRNA made aware. Procedure to be delayed until 3:45pm.  ?

## 2022-01-08 NOTE — Patient Instructions (Signed)
Handouts given on hemorrhoids and polyps. Await pathology results. ?High Fiber Diet recommended and use of FiberCon 1-2 tablets daily. ?Continue present medications. ?Repeat colonoscopy in 5-10 years for surveillance based on pathology results. ? ? ?YOU HAD AN ENDOSCOPIC PROCEDURE TODAY AT [facility]:   Refer to the procedure report that was given to you for any specific questions about what was found during the examination.  If the procedure report does not answer your questions, please call your gastroenterologist to clarify.  If you requested that your care partner not be given the details of your procedure findings, then the procedure report has been included in a sealed envelope for you to review at your convenience later. ? ?YOU SHOULD EXPECT: Some feelings of bloating in the abdomen. Passage of more gas than usual.  Walking can help get rid of the air that was put into your GI tract during the procedure and reduce the bloating. If you had a lower endoscopy (such as a colonoscopy or flexible sigmoidoscopy) you may notice spotting of blood in your stool or on the toilet paper. If you underwent a bowel prep for your procedure, you may not have a normal bowel movement for a few days. ? ?Please Note:  You might notice some irritation and congestion in your nose or some drainage.  This is from the oxygen used during your procedure.  There is no need for concern and it should clear up in a day or so. ? ?SYMPTOMS TO REPORT IMMEDIATELY: ? ?Following lower endoscopy (colonoscopy or flexible sigmoidoscopy): ? Excessive amounts of blood in the stool ? Significant tenderness or worsening of abdominal pains ? Swelling of the abdomen that is new, acute ? Fever of 100?F or higher ? ?For urgent or emergent issues, a gastroenterologist can be reached at any hour by calling [phone]. ?Do not use MyChart messaging for urgent concerns.  ? ? ?DIET:  We do recommend a small meal at first, but then you may  proceed to your regular diet.  Drink plenty of fluids but you should avoid alcoholic beverages for 24 hours. ? ?ACTIVITY:  You should plan to take it easy for the rest of today and you should NOT DRIVE or use heavy machinery until tomorrow (because of the sedation medicines used during the test).   ? ?FOLLOW UP: ?Our staff will call the number listed on your records 48-72 hours following your procedure to check on you and address any questions or concerns that you may have regarding the information given to you following your procedure. If we do not reach you, we will leave a message.  We will attempt to reach you two times.  During this call, we will ask if you have developed any symptoms of COVID 19. If you develop any symptoms (ie: fever, flu-like symptoms, shortness of breath, cough etc.) before then, please call [phone].  If you test positive for Covid 19 in the 2 weeks post procedure, please call and report this information to [place].   ? ?If any biopsies were taken you will be contacted by phone or by letter within the next 1-3 weeks.  Please call us at [phone] if you have not heard about the biopsies in 3 weeks.  ? ? ?SIGNATURES/CONFIDENTIALITY: ?You and/or your care partner have signed paperwork which will be entered into your electronic medical record.  These signatures attest to the fact that that the information above on your After Visit Summary has been reviewed and is understood.  Full responsibility of the confidentiality of this discharge information lies with you and/or your care-partner.  ?

## 2022-01-08 NOTE — Op Note (Addendum)
[facility] ?Patient Name: Kelly Dixon ?Procedure Date: 01/08/2022 4:08 PM ?MRN: [id number] ?Endoscopist: Justice Britain , MD ?Age: 76 ?Referring MD:  ?Date of Birth: 07/23/1946 ?Gender: Female ?Account #: [id number] ?Procedure:                Colonoscopy ?Indications:              Positive Cologuard test ?Medicines:                Monitored Anesthesia Care ?Procedure:                Pre-Anesthesia Assessment: ?                          - Prior to the procedure, a History and Physical  ?                          was performed, and patient medications and  ?                          allergies were reviewed. The patient's tolerance of  ?                          previous anesthesia was also reviewed. The risks  ?                          and benefits of the procedure and the sedation  ?                          options and risks were discussed with the patient.  ?                          All questions were answered, and informed consent  ?                          was obtained. Prior Anticoagulants: The patient has  ?                          taken no previous anticoagulant or antiplatelet  ?                          agents. ASA Grade Assessment: II - A patient with  ?                          mild systemic disease. After reviewing the risks  ?                          and benefits, the patient was deemed in  ?                          satisfactory condition to undergo the procedure. ?                          After obtaining informed consent, the colonoscope  ?  was passed under direct vision. Throughout the  ?                          procedure, the patient's blood pressure, pulse, and  ?                          oxygen saturations were monitored continuously. The  ?                          Olympus CF-[id number] (#[id number]) Colonoscope was  ?                          introduced through the anus and advanced to the 5  ?                          cm into the ileum. The colonoscopy was  performed  ?                          without difficulty. The patient tolerated the  ?                          procedure. The quality of the bowel preparation was  ?                          good. The terminal ileum, ileocecal valve,  ?                          appendiceal orifice, and rectum were photographed. ?Scope In: 4:14:46 PM ?Scope Out: 4:29:59 PM ?Scope Withdrawal Time: 0 hours 11 minutes 34 seconds  ?Total Procedure Duration: 0 hours 15 minutes 13 seconds  ?Findings:                 The digital rectal exam findings include  ?                          hemorrhoids. Pertinent negatives include no  ?                          palpable rectal lesions. ?                          The terminal ileum and ileocecal valve appeared  ?                          normal. ?                          A 6 mm polyp was found in the rectum. The polyp was  ?                          sessile. The polyp was removed with a cold snare.  ?                          Resection and retrieval were complete. ?  Multiple small and large-mouthed diverticula were  ?                          found in the entire colon. ?                          Normal mucosa was found in the entire colon  ?                          otherwise. ?                          Non-bleeding non-thrombosed external and internal  ?                          hemorrhoids were found during retroflexion, during  ?                          perianal exam and during digital exam. The  ?                          hemorrhoids were Grade II (internal hemorrhoids  ?                          that prolapse but reduce spontaneously). ?Complications:            No immediate complications. ?Estimated Blood Loss:     Estimated blood loss was minimal. ?Impression:               - Hemorrhoids found on digital rectal exam. ?                          - The examined portion of the ileum was normal. ?                          - One 6 mm polyp in the rectum, removed with a  cold  ?                          snare. Resected and retrieved. ?                          - Diverticulosis in the entire examined colon. ?                          - Normal mucosa in the entire examined colon  ?                          otherwise. ?                          - Non-bleeding non-thrombosed external and internal  ?                          hemorrhoids. ?Recommendation:           - The patient will be observed post-procedure,  ?  until all discharge criteria are met. ?                          - Discharge patient to home. ?                          - Patient has a contact number available for  ?                          emergencies. The signs and symptoms of potential  ?                          delayed complications were discussed with the  ?                          patient. Return to normal activities tomorrow.  ?                          Written discharge instructions were provided to the  ?                          patient. ?                          - High fiber diet. ?                          - Use FiberCon 1-2 tablets PO daily. ?                          - Await pathology results. ?                          - Repeat colonoscopy in 5-10 years for surveillance  ?                          based on pathology results. ?                          - Recommend a CBC and Iron indices be checked at  ?                          patient convenience to ensure no other Positive  ?                          Cologuard. ?                          - The findings and recommendations were discussed  ?                          with the patient. ?                          - The findings and recommendations were discussed  ?  with the patient's family. ?Justice Britain, MD ?01/08/2022 4:39:12 PM ?

## 2022-01-09 ENCOUNTER — Ambulatory Visit: Payer: Medicare HMO | Admitting: Orthopaedic Surgery

## 2022-01-09 ENCOUNTER — Encounter: Payer: Self-pay | Admitting: Orthopaedic Surgery

## 2022-01-09 ENCOUNTER — Ambulatory Visit: Payer: Self-pay

## 2022-01-09 ENCOUNTER — Ambulatory Visit (INDEPENDENT_AMBULATORY_CARE_PROVIDER_SITE_OTHER): Payer: Medicare HMO

## 2022-01-09 VITALS — Ht 62.0 in | Wt 193.0 lb

## 2022-01-09 DIAGNOSIS — M170 Bilateral primary osteoarthritis of knee: Secondary | ICD-10-CM | POA: Diagnosis not present

## 2022-01-09 DIAGNOSIS — M25562 Pain in left knee: Secondary | ICD-10-CM | POA: Diagnosis not present

## 2022-01-09 DIAGNOSIS — M25561 Pain in right knee: Secondary | ICD-10-CM

## 2022-01-09 DIAGNOSIS — G8929 Other chronic pain: Secondary | ICD-10-CM | POA: Diagnosis not present

## 2022-01-09 NOTE — Progress Notes (Signed)
? ?Office Visit Note ?  ?Patient: Kelly Dixon           ?Date of Birth: 05-31-1946           ?MRN: [id number] ?Visit Date: 01/09/2022 ?             ?Requested by: Shelda Pal, DO ?[address] ?[address] ?[place],   [id number] ?PCP: Shelda Pal, DO ? ? ?Assessment & Plan: ?Visit Diagnoses:  ?1. Chronic pain of both knees   ?2. Bilateral primary osteoarthritis of knee   ? ? ?Plan: Mrs. Donahoo has a history of osteoarthritis both knees over a period of many years.  She relates taking Tylenol to ease "some of my pain".  In the past she has had cortisone injections which have provided minimal relief.  She has been experiencing compromise of her activities. She is at the point where she wants to consider knee replacement.  Her films demonstrate end-stage osteoarthritis of both knees with bone-on-bone in the medial compartment associated with increased varus bilaterally.  After much discussion she really would like to proceed with knee replacement surgery.  I will refer her to Dr. Ninfa Linden.  We did talk about the procedure and what she can expect postoperatively in terms of rehab, physical therapy.  I think she like to consider having both knees performed within this calendar year ? ?Follow-Up Instructions: Return Refer to Dr. Ninfa Linden for left total knee replacement.  ? ?Orders:  ?Orders Placed This Encounter  ?Procedures  ? XR KNEE 3 VIEW RIGHT  ? XR KNEE 3 VIEW LEFT  ? Ambulatory referral to Orthopedic Surgery  ? ?No orders of the defined types were placed in this encounter. ? ? ? ? Procedures: ?No procedures performed ? ? ?Clinical Data: ?No additional findings. ? ? ?Subjective: ?Chief Complaint  ?Patient presents with  ? Left Knee - Pain  ? Right Knee - Pain  ?Patient presents today for bilateral knee pain. She said that her left one was fractured many many years ago. She did not have pain at the time, but seemed to develop it some time later after gaining weight. She said that her  right knee started after compensating for the left knee pain. She said that they now both hurt equally the same. The pain is constant except with sleeping. She has occasional swelling. She takes Tylenol as needed. She is not diabetic.  Vies to swim frequently during the week as part of an exercise program as she has difficulty being on her feet for exercise.  She has had cortisone injections in the past with little if any relief. ? ?HPI ? ?Review of Systems ? ? ?Objective: ?Vital Signs: Ht '5\' 2"'$  (1.575 m)   Wt 193 lb (87.5 kg)   BMI 35.30 kg/m?  ? ?Physical Exam ?Constitutional:   ?   Appearance: She is well-developed.  ?Pulmonary:  ?   Effort: Pulmonary effort is normal.  ?Skin: ?   General: Skin is warm and dry.  ?Neurological:  ?   Mental Status: She is alert and oriented to person, place, and time.  ?Psychiatric:     ?   Behavior: Behavior normal.  ? ? ?Ortho Exam awake alert and oriented x3.  Comfortable sitting.  Does have somewhat of a waddling gait favoring both of her knees.  Increased varus with weightbearing.  Lacks a few degrees to full extension bilaterally at the knees more so on the left than the right.  Mostly  pain along the medial compartment of both knees with palpable osteophytes.  May be very small effusion.  Flexed about 105 degrees on the right and about 100 degrees on the left without instability.  No popliteal pain.  No calf pain.  Some mild ankle swelling.  Motor and sensory exam intact to both lower extremities.  Positive crepitation patella motion but no significant pain with compression ? ?Specialty Comments:  ?No specialty comments available. ? ?Imaging: ?XR KNEE 3 VIEW LEFT ? ?Result Date: 01/09/2022 ?Films of the left knee were obtained in 3 projections standing.  Films of the left knee are very similar to those on the right with end-stage osteoarthritis.  There is bone-on-bone in the medial compartment with subchondral cysts, subchondral sclerosis on both sides of the joint and  peripheral osteophytes.  There is about 7 degrees of varus.  Considerable degenerative changes in the patellofemoral and lateral compartments as well.  Decreased bone density.  There may be some loose bodies.  Films are consistent with advanced, end-stage osteoarthritis ? ?XR KNEE 3 VIEW RIGHT ? ?Result Date: 01/09/2022 ?Films of the right knee are obtained in 3 projections standing.  There is about 7 to 8 degrees of varus with bone-on-bone in the medial compartment.  Considerable degenerative changes at the patellofemoral and lateral compartment as well.  Medially there is subchondral sclerosis, subchondral cysts and peripheral osteophytes.  There may be some loose bodies posteriorly.  Bone density appears decreased.  Films are consistent with end-stage osteoarthritis  ? ? ?PMFS History: ?Patient Active Problem List  ? Diagnosis Date Noted  ? Bilateral primary osteoarthritis of knee 01/09/2022  ? Chronic pain of both knees 10/09/2020  ? Essential hypertension 09/04/2020  ? Malignant neoplasm of upper-outer quadrant of right breast in female, estrogen receptor positive ([facility]) 09/08/2019  ? ?Past Medical History:  ?Diagnosis Date  ? Arthritis   ? Breast cancer ([facility]) 2020  ? right breast  ? Gallstones 05/31/2018  ? GERD (gastroesophageal reflux disease)   ? Hypertension   ? Personal history of radiation therapy   ? PONV (postoperative nausea and vomiting)   ? Syphilis   ?  ?Family History  ?Problem Relation Age of Onset  ? Arthritis Mother   ? Hearing loss Mother   ? Heart disease Father   ? Arthritis Sister   ? Colon cancer Neg Hx   ? Colon polyps Neg Hx   ? Esophageal cancer Neg Hx   ? Rectal cancer Neg Hx   ? Stomach cancer Neg Hx   ?  ?Past Surgical History:  ?Procedure Laterality Date  ? BREAST LUMPECTOMY WITH RADIOACTIVE SEED AND SENTINEL LYMPH NODE BIOPSY Right 09/15/2019  ? Procedure: RIGHT BREAST LUMPECTOMY WITH RADIOACTIVE SEED AND SENTINEL LYMPH NODE BIOPSY;  Surgeon: Donnie Mesa, MD;  Location: [facility];  Service: General;  Laterality: Right;  ? CHOLECYSTECTOMY N/A 04/13/2019  ? Procedure: LAPAROSCOPIC CHOLECYSTECTOMY WITH INTRAOPERATIVE CHOLANGIOGRAM;  Surgeon: Donnie Mesa, MD;  Location: [facility];  Service: General;  Laterality: N/A;  ? ?Social History  ? ?Occupational History  ? Not on file  ?Tobacco Use  ? Smoking status: Former  ?  Packs/day: 0.50  ?  Types: Cigarettes  ?  Quit date: 11/29/2019  ?  Years since quitting: 2.1  ? Smokeless tobacco: Never  ?Vaping Use  ? Vaping Use: Never used  ?Substance and Sexual Activity  ? Alcohol use: Yes  ?  Comment: rarely  ? Drug use: Never  ? Sexual activity:  Not on file  ? ? ? ? ? ? ?

## 2022-01-10 ENCOUNTER — Telehealth: Payer: Self-pay | Admitting: *Deleted

## 2022-01-10 ENCOUNTER — Telehealth: Payer: Self-pay

## 2022-01-10 NOTE — Telephone Encounter (Signed)
?  Follow up Call- ? ? ?  01/08/2022  ?  2:16 PM  ?Call back number  ?Post procedure Call Back phone  # [phone]  ?Permission to leave phone message Yes  ?  ? ?Patient questions: ? ?Do you have a fever, pain , or abdominal swelling? No. ?Pain Score  0 * ? ?Have you tolerated food without any problems? Yes.   ? ?Have you been able to return to your normal activities? Yes.   ? ?Do you have any questions about your discharge instructions: ?Diet   No. ?Medications  No. ?Follow up visit  No. ? ?Do you have questions or concerns about your Care? No. ? ?Actions: ?* If pain score is 4 or above: ?No action needed, pain <4. ? ? ?

## 2022-01-10 NOTE — Telephone Encounter (Signed)
Attempted to call patient for their post-procedure follow-up call. Number provided no longer in service. ? ?

## 2022-01-10 NOTE — Telephone Encounter (Signed)
?  Follow up Call- ? ? ?  01/08/2022  ?  2:16 PM  ?Call back number  ?Post procedure Call Back phone  # [phone]  ?Permission to leave phone message Yes  ?  ? ?Patient questions: ? ?Do you have a fever, pain , or abdominal swelling? No. ?Pain Score  0 * ? ?Have you tolerated food without any problems? Yes.   ? ?Have you been able to return to your normal activities? Yes.   ? ?Do you have any questions about your discharge instructions: ?Diet   No. ?Medications  No. ?Follow up visit  No. ? ?Do you have questions or concerns about your Care? No. ? ?Actions: ?* If pain score is 4 or above: ?No action needed, pain <4. ? ? ?

## 2022-01-13 ENCOUNTER — Encounter: Payer: Self-pay | Admitting: Gastroenterology

## 2022-01-22 ENCOUNTER — Ambulatory Visit: Payer: Medicare HMO

## 2022-01-22 ENCOUNTER — Encounter: Payer: Self-pay | Admitting: *Deleted

## 2022-01-22 ENCOUNTER — Ambulatory Visit: Payer: Medicare HMO | Admitting: Orthopaedic Surgery

## 2022-01-22 VITALS — Ht 62.0 in | Wt 193.0 lb

## 2022-01-22 DIAGNOSIS — M1712 Unilateral primary osteoarthritis, left knee: Secondary | ICD-10-CM | POA: Insufficient documentation

## 2022-01-22 DIAGNOSIS — M1711 Unilateral primary osteoarthritis, right knee: Secondary | ICD-10-CM

## 2022-01-22 DIAGNOSIS — M170 Bilateral primary osteoarthritis of knee: Secondary | ICD-10-CM | POA: Diagnosis not present

## 2022-01-22 NOTE — Progress Notes (Signed)
The patient is a very pleasant 76 year old with severe end-stage arthritis of both her knees.  Her left knee hurts worse than the right but both on her severely.  When I see her walk she has a hard time even getting around especially when she sits down for a while and gets up to stand.  She has been seen by Dr. Durward Fortes for a long period of time.  She has tried failed all forms conservative treatment for several years now including activity modification, weight loss, anti-inflammatories, and multiple injections.  At this point her bilateral knee pain is daily and it is 10 out of 10.  It is detrimentally affecting her mobility, her quality of life and actives daily living.  She is not a diabetic.  Her BMI is 35.  She currently denies any headache, chest pain, shortness of breath, fever, chills, nausea, or vomiting.  She works in the school system. ? ?Examination of both knees shows flexion contractures of both knees with painful arc of motion.  Both knees have varus malalignment with patellofemoral crepitation and global tenderness. ? ?X-rays in the canopy system of both knees show severe end-stage arthritis of both knees.  Both knees have complete loss of joint space medial and patellofemoral.  There is severe bone-on-bone wear.  There is osteophytes in all 3 compartments. ? ?I talked in length in detail about knee replacement surgery.  Her left knee is more painful to her and there is certainly more deformity on x-ray.  She would like to proceed with that knee first for knee replacement.  I showed her knee replacement model and explained what the surgery involves.  I went over her x-rays with her.  I discussed the risks and benefits of surgery and talked about what to expect from an intraoperative and postoperative course.  All questions and concerns were answered and addressed.  We will work on getting her surgery scheduled for her left knee in June.  She would like to get her right knee done after that if the  left knee goes well. ?

## 2022-01-24 ENCOUNTER — Ambulatory Visit: Payer: Medicare HMO

## 2022-02-11 ENCOUNTER — Ambulatory Visit (INDEPENDENT_AMBULATORY_CARE_PROVIDER_SITE_OTHER): Payer: Medicare HMO

## 2022-02-11 DIAGNOSIS — Z0000 Encounter for general adult medical examination without abnormal findings: Secondary | ICD-10-CM

## 2022-02-11 NOTE — Progress Notes (Signed)
? ?Subjective:  ? Kelly Dixon is a 76 y.o. female who presents for Medicare Annual (Subsequent) preventive examination. ? ?I connected with  Kelly Dixon on 02/11/22 by a audio enabled telemedicine application and verified that I am speaking with the correct person using two identifiers. ? ?Patient Location: Home ? ?Provider Location: Office/Clinic ? ?I discussed the limitations of evaluation and management by telemedicine. The patient expressed understanding and agreed to proceed.  ? ?Review of Systems    ? ?Cardiac Risk Factors include: advanced age (>53mn, >>26women);hypertension ? ?   ?Objective:  ?  ?Today's Vitals  ? 02/11/22 0904  ?PainSc: 8   ? ?There is no height or weight on file to calculate BMI. ? ? ?  02/11/2022  ?  9:03 AM 01/18/2021  ? 12:27 PM 12/30/2019  ?  9:12 AM 10/08/2019  ?  7:34 AM 09/15/2019  ? 11:36 AM 09/13/2019  ?  9:44 AM 04/06/2019  ? 10:11 AM  ?Advanced Directives  ?Does Patient Have a Medical Advance Directive? Yes No No No No No No  ?Type of [place] DNR (pink MOST or yellow form);Living will        ?Does patient want to make changes to medical advance directive? No - Patient declined        ?Copy of [place] [place] Chart? Yes - validated most recent copy scanned in chart (See row information)        ?Would patient like information on creating a medical advance directive?  Yes (MAU/Ambulatory/Procedural Areas - Information given) No - Patient declined No - Patient declined No - Patient declined No - Patient declined No - Patient declined  ? ? ?Current Medications (verified) ?Outpatient Encounter Medications as of 02/11/2022  ?Medication Sig  ? Acetaminophen (TYLENOL PO) Take by mouth.  ? amLODipine (NORVASC) 5 MG tablet TAKE 1 TABLET(5 MG) BY MOUTH DAILY  ? NON FORMULARY dphenhydramine HO PO  ? ?No facility-administered encounter medications on file as of 02/11/2022.  ? ? ?Allergies (verified) ?Patient has no known  allergies.  ? ?History: ?Past Medical History:  ?Diagnosis Date  ? Arthritis   ? Breast cancer ([place] 2020  ? right breast  ? Gallstones 05/31/2018  ? GERD (gastroesophageal reflux disease)   ? Hypertension   ? Personal history of radiation therapy   ? PONV (postoperative nausea and vomiting)   ? Syphilis   ? ?Past Surgical History:  ?Procedure Laterality Date  ? BREAST LUMPECTOMY WITH RADIOACTIVE SEED AND SENTINEL LYMPH NODE BIOPSY Right 09/15/2019  ? Procedure: RIGHT BREAST LUMPECTOMY WITH RADIOACTIVE SEED AND SENTINEL LYMPH NODE BIOPSY;  Surgeon: TDonnie Mesa MD;  [facility]  Service: General;  Laterality: Right;  ? CHOLECYSTECTOMY N/A 04/13/2019  ? Procedure: LAPAROSCOPIC CHOLECYSTECTOMY WITH INTRAOPERATIVE CHOLANGIOGRAM;  Surgeon: TDonnie Mesa MD;  [facility]  Service: General;  Laterality: N/A;  ? ?Family History  ?Problem Relation Age of Onset  ? Arthritis Mother   ? Hearing loss Mother   ? Heart disease Father   ? Arthritis Sister   ? Colon cancer Neg Hx   ? Colon polyps Neg Hx   ? Esophageal cancer Neg Hx   ? Rectal cancer Neg Hx   ? Stomach cancer Neg Hx   ? ?Social History  ? ?Socioeconomic History  ? Marital status: Divorced  ?  Spouse name: Not on file  ? Number of children: Not on file  ?  Years of education: Not on file  ? Highest education level: Not on file  ?Occupational History  ? Not on file  ?Tobacco Use  ? Smoking status: Former  ?  Packs/day: 0.50  ?  Types: Cigarettes  ?  Quit date: 11/29/2019  ?  Years since quitting: 2.2  ? Smokeless tobacco: Never  ?Vaping Use  ? Vaping Use: Never used  ?Substance and Sexual Activity  ? Alcohol use: Yes  ?  Comment: rarely  ? Drug use: Never  ? Sexual activity: Not on file  ?Other Topics Concern  ? Not on file  ?Social History Narrative  ? Not on file  ? ?Social Determinants of Health  ? ?Financial Resource Strain: Not on file  ?Food Insecurity: Not on file  ?Transportation Needs: Not on file  ?Physical Activity: Not on  file  ?Stress: Not on file  ?Social Connections: Not on file  ? ? ?Tobacco Counseling ?Counseling given: Not Answered ? ? ?Clinical Intake: ? ?Pre-visit preparation completed: Yes ? ?Pain : 0-10 ?Pain Score: 8  ?Pain Type: Chronic pain ?Pain Location: Knee ?Pain Orientation: Left, Right ?Pain Descriptors / Indicators: Aching, Dull, Sore ?Pain Onset: More than a month ago ?Pain Frequency: Constant ? ?  ? ?Nutritional Risks: None ?Diabetes: No ? ?How often do you need to have someone help you when you read instructions, pamphlets, or other written materials from your doctor or pharmacy?: 1 - Never ? ?Diabetic?No ? ?Interpreter Needed?: No ? ?Information entered by :: Kelly Dixon ? ? ?Activities of Daily Living ? ?  02/11/2022  ?  9:05 AM  ?In your present state of health, do you have any difficulty performing the following activities:  ?Hearing? 1  ?Comment needs hearing aid  ?Vision? 0  ?Difficulty concentrating or making decisions? 0  ?Walking or climbing stairs? 1  ?Dressing or bathing? 0  ?Doing errands, shopping? 0  ?Preparing Food and eating ? N  ?Using the Toilet? N  ?In the past six months, have you accidently leaked urine? Y  ?Do you have problems with loss of bowel control? N  ?Managing your Medications? N  ?Managing your Finances? N  ?Housekeeping or managing your Housekeeping? N  ? ? ?Patient Care Team: ?Kelly Pal, DO as PCP - General (Family Medicine) ?Kelly Mesa, MD as Consulting Physician (General Surgery) ?Kelly Lose, MD as Consulting Physician (Hematology and Oncology) ?Kelly Gibson, MD as Attending Physician (Radiation Oncology) ? ?Indicate any recent Medical Services you may have received from other than [facility] providers in the past year (date may be approximate). ? ?   ?Assessment:  ? This is a routine wellness examination for Kelly Dixon. ? ?Hearing/Vision screen ?No results found. ? ?Dietary issues and exercise activities discussed: ?Current Exercise Habits: Home exercise routine,  Type of exercise: Other - see comments (swimming), Time (Minutes): 45, Frequency (Times/Week): 3, Weekly Exercise (Minutes/Week): 135, Intensity: Mild ? ? Goals Addressed   ?None ?  ? ?Depression Screen ? ?  02/11/2022  ?  9:03 AM 01/18/2021  ? 12:30 PM 12/30/2019  ?  9:16 AM 12/28/2018  ? 11:23 AM  ?PHQ 2/9 Scores  ?PHQ - 2 Score 0 1 0 0  ?  ?Fall Risk ? ?  02/11/2022  ?  9:03 AM 01/18/2021  ? 12:30 PM 12/30/2019  ?  9:16 AM 12/28/2018  ? 11:23 AM  ?Fall Risk   ?Falls in the past year? 0 0 0 0  ?Number falls in past yr: 0 0 0   ?  Injury with Fall? 0 0 0   ?Risk for fall due to : No Fall Risks     ?Follow up Falls evaluation completed Falls prevention discussed Education provided;Falls prevention discussed   ? ? ?FALL RISK PREVENTION PERTAINING TO THE HOME: ? ?Any stairs in or around the home? Yes  ?If so, are there any without handrails? No  ?Home free of loose throw rugs in walkways, pet beds, electrical cords, etc? Yes  ?Adequate lighting in your home to reduce risk of falls? Yes  ? ?ASSISTIVE DEVICES UTILIZED TO PREVENT FALLS: ? ?Life alert? No  ?Use of a cane, walker or w/c? No  ?Grab bars in the bathroom? Yes  ?Shower chair or bench in shower? Yes  ?Elevated toilet seat or a handicapped toilet? No  ? ?TIMED UP AND GO: ? ?Was the test performed? No .  ? ? ?Cognitive Function: ?  ?  ? ?  02/11/2022  ?  9:08 AM 01/18/2021  ? 12:41 PM  ?6CIT Screen  ?What Year? 0 points 0 points  ?What month? 0 points 0 points  ?What time? 0 points 0 points  ?Count back from 20 0 points 0 points  ?Months in reverse 0 points 0 points  ?Repeat phrase 0 points 0 points  ?Total Score 0 points 0 points  ? ? ?Immunizations ?Immunization History  ?Administered Date(s) Administered  ? Moderna Covid-19 Vaccine Bivalent Booster 48yr & up 08/04/2021  ? PFIZER(Purple Top)SARS-COV-2 Vaccination 11/25/2019, 12/15/2019, 07/24/2020, 01/18/2021  ? PPD Test 06/24/2018  ? Pneumococcal Conjugate-13 05/27/2018  ? Tdap 05/27/2018  ? ? ?TDAP status: Up to  date ? ?Flu Vaccine status: Due, Education has been provided regarding the importance of this vaccine. Advised may receive this vaccine at local pharmacy or Health Dept. Aware to provide a copy of the vaccination rec

## 2022-02-11 NOTE — Patient Instructions (Signed)
Ms. Pettaway , ?Thank you for taking time to come for your Medicare Wellness Visit. I appreciate your ongoing commitment to your health goals. Please review the following plan we discussed and let me know if I can assist you in the future.  ? ?Screening recommendations/referrals: ?Colonoscopy: 01/08/22 due 01/09/27 ?Mammogram: 09/20/21 due 09/20/22 ?Bone Density: 08/01/20 due 08/01/22 ?Recommended yearly ophthalmology/optometry visit for glaucoma screening and checkup ?Recommended yearly dental visit for hygiene and checkup ? ?Vaccinations: ?Influenza vaccine: Due-May obtain vaccine at your local pharmacy.  ?Pneumococcal vaccine: Due-May obtain vaccine at your local pharmacy.  ?Tdap vaccine: up to date ?Shingles vaccine: Due-May obtain vaccine at your local pharmacy.    ?Covid-19:completed ? ?Advanced directives: yes, not on file ? ?Conditions/risks identified: see problem list  ? ?Next appointment: Follow up in one year for your annual wellness visit  ? ? ?Preventive Care 42 Years and Older, Female ?Preventive care refers to lifestyle choices and visits with your health care provider that can promote health and wellness. ?What does preventive care include? ?A yearly physical exam. This is also called an annual well check. ?Dental exams once or twice a year. ?Routine eye exams. Ask your health care provider how often you should have your eyes checked. ?Personal lifestyle choices, including: ?Daily care of your teeth and gums. ?Regular physical activity. ?Eating a healthy diet. ?Avoiding tobacco and drug use. ?Limiting alcohol use. ?Practicing safe sex. ?Taking low-dose aspirin every day. ?Taking vitamin and mineral supplements as recommended by your health care provider. ?What happens during an annual well check? ?The services and screenings done by your health care provider during your annual well check will depend on your age, overall health, lifestyle risk factors, and family history of disease. ?Counseling  ?Your  health care provider may ask you questions about your: ?Alcohol use. ?Tobacco use. ?Drug use. ?Emotional well-being. ?Home and relationship well-being. ?Sexual activity. ?Eating habits. ?History of falls. ?Memory and ability to understand (cognition). ?Work and work Statistician. ?Reproductive health. ?Screening  ?You may have the following tests or measurements: ?Height, weight, and BMI. ?Blood pressure. ?Lipid and cholesterol levels. These may be checked every 5 years, or more frequently if you are over 63 years old. ?Skin check. ?Lung cancer screening. You may have this screening every year starting at age 2 if you have a 30-pack-year history of smoking and currently smoke or have quit within the past 15 years. ?Fecal occult blood test (FOBT) of the stool. You may have this test every year starting at age 11. ?Flexible sigmoidoscopy or colonoscopy. You may have a sigmoidoscopy every 5 years or a colonoscopy every 10 years starting at age 15. ?Hepatitis C blood test. ?Hepatitis B blood test. ?Sexually transmitted disease (STD) testing. ?Diabetes screening. This is done by checking your blood sugar (glucose) after you have not eaten for a while (fasting). You may have this done every 1-3 years. ?Bone density scan. This is done to screen for osteoporosis. You may have this done starting at age 57. ?Mammogram. This may be done every 1-2 years. Talk to your health care provider about how often you should have regular mammograms. ?Talk with your health care provider about your test results, treatment options, and if necessary, the need for more tests. ?Vaccines  ?Your health care provider may recommend certain vaccines, such as: ?Influenza vaccine. This is recommended every year. ?Tetanus, diphtheria, and acellular pertussis (Tdap, Td) vaccine. You may need a Td booster every 10 years. ?Zoster vaccine. You may need this after age 86. ?  Pneumococcal 13-valent conjugate (PCV13) vaccine. One dose is recommended after age  10. ?Pneumococcal polysaccharide (PPSV23) vaccine. One dose is recommended after age 76. ?Talk to your health care provider about which screenings and vaccines you need and how often you need them. ?This information is not intended to replace advice given to you by your health care provider. Make sure you discuss any questions you have with your health care provider. ?Document Released: 10/13/2015 Document Revised: 06/05/2016 Document Reviewed: 07/18/2015 ?Elsevier Interactive Patient Education ? 2017 [place]. ? ?Fall Prevention in the Home ?Falls can cause injuries. They can happen to people of all ages. There are many things you can do to make your home safe and to help prevent falls. ?What can I do on the outside of my home? ?Regularly fix the edges of walkways and driveways and fix any cracks. ?Remove anything that might make you trip as you walk through a door, such as a raised step or threshold. ?Trim any bushes or trees on the path to your home. ?Use bright outdoor lighting. ?Clear any walking paths of anything that might make someone trip, such as rocks or tools. ?Regularly check to see if handrails are loose or broken. Make sure that both sides of any steps have handrails. ?Any raised decks and porches should have guardrails on the edges. ?Have any leaves, snow, or ice cleared regularly. ?Use sand or salt on walking paths during winter. ?Clean up any spills in your garage right away. This includes oil or grease spills. ?What can I do in the bathroom? ?Use night lights. ?Install grab bars by the toilet and in the tub and shower. Do not use towel bars as grab bars. ?Use non-skid mats or decals in the tub or shower. ?If you need to sit down in the shower, use a plastic, non-slip stool. ?Keep the floor dry. Clean up any water that spills on the floor as soon as it happens. ?Remove soap buildup in the tub or shower regularly. ?Attach bath mats securely with double-sided non-slip rug tape. ?Do not have throw  rugs and other things on the floor that can make you trip. ?What can I do in the bedroom? ?Use night lights. ?Make sure that you have a light by your bed that is easy to reach. ?Do not use any sheets or blankets that are too big for your bed. They should not hang down onto the floor. ?Have a firm chair that has side arms. You can use this for support while you get dressed. ?Do not have throw rugs and other things on the floor that can make you trip. ?What can I do in the kitchen? ?Clean up any spills right away. ?Avoid walking on wet floors. ?Keep items that you use a lot in easy-to-reach places. ?If you need to reach something above you, use a strong step stool that has a grab bar. ?Keep electrical cords out of the way. ?Do not use floor polish or wax that makes floors slippery. If you must use wax, use non-skid floor wax. ?Do not have throw rugs and other things on the floor that can make you trip. ?What can I do with my stairs? ?Do not leave any items on the stairs. ?Make sure that there are handrails on both sides of the stairs and use them. Fix handrails that are broken or loose. Make sure that handrails are as long as the stairways. ?Check any carpeting to make sure that it is firmly attached to the stairs. Fix any  carpet that is loose or worn. ?Avoid having throw rugs at the top or bottom of the stairs. If you do have throw rugs, attach them to the floor with carpet tape. ?Make sure that you have a light switch at the top of the stairs and the bottom of the stairs. If you do not have them, ask someone to add them for you. ?What else can I do to help prevent falls? ?Wear shoes that: ?Do not have high heels. ?Have rubber bottoms. ?Are comfortable and fit you well. ?Are closed at the toe. Do not wear sandals. ?If you use a stepladder: ?Make sure that it is fully opened. Do not climb a closed stepladder. ?Make sure that both sides of the stepladder are locked into place. ?Ask someone to hold it for you, if  possible. ?Clearly mark and make sure that you can see: ?Any grab bars or handrails. ?First and last steps. ?Where the edge of each step is. ?Use tools that help you move around (mobility aids) if they are neede

## 2022-02-14 ENCOUNTER — Other Ambulatory Visit: Payer: Self-pay

## 2022-03-06 ENCOUNTER — Other Ambulatory Visit: Payer: Self-pay | Admitting: Physician Assistant

## 2022-03-06 DIAGNOSIS — M1712 Unilateral primary osteoarthritis, left knee: Secondary | ICD-10-CM

## 2022-03-18 NOTE — Pre-Procedure Instructions (Signed)
Surgical Instructions    Your procedure is scheduled on Tuesday, June 27th.  Report to [facility] Main Entrance "A" at 12:40 P.M., then check in with the Admitting office.  Call this number if you have problems the morning of surgery:  [phone]   If you have any questions prior to your surgery date call [phone]: Open Monday-Friday 8am-4pm    Remember:  Do not eat after midnight the night before your surgery  You may drink clear liquids until 11:40 a.m. the morning of your surgery.   Clear liquids allowed are: Water, Non-Citrus Juices (without pulp), Carbonated Beverages, Clear Tea, Black Coffee Only (NO MILK, CREAM OR POWDERED CREAMER of any kind), and Gatorade.   Enhanced Recovery after Surgery for Orthopedics Enhanced Recovery after Surgery is a protocol used to improve the stress on your body and your recovery after surgery.  Patient Instructions  The day of surgery (if you do NOT have diabetes):  Drink ONE (1) Pre-Surgery Clear Ensure by 11:40 am the morning of surgery   This drink was given to you during your hospital  pre-op appointment visit. Nothing else to drink after completing the  Pre-Surgery Clear Ensure.         If you have questions, please contact your surgeon's office.     Take these medicines the morning of surgery with A SIP OF WATER  acetaminophen (TYLENOL) amLODipine (NORVASC)  fexofenadine (ALLEGRA)   As of today, STOP taking any Aspirin (unless otherwise instructed by your surgeon) Aleve, Naproxen, Ibuprofen, Motrin, Advil, Goody's, BC's, all herbal medications, fish oil, and all vitamins.                     Do NOT Smoke (Tobacco/Vaping) for 24 hours prior to your procedure.  If you use a CPAP at night, you may bring your mask/headgear for your overnight stay.   Contacts, glasses, piercing's, hearing aid's, dentures or partials may not be worn into surgery, please bring cases for these belongings.    For patients admitted to the hospital,  discharge time will be determined by your treatment team.   Patients discharged the day of surgery will not be allowed to drive home, and someone needs to stay with them for 24 hours.  SURGICAL WAITING ROOM VISITATION Patients having surgery or a procedure may have two support people in the waiting room. These visitors may be switched out with other visitors if needed. Children under the age of 60 must have an adult accompany them who is not the patient. If the patient needs to stay at the hospital during part of their recovery, the visitor guidelines for inpatient rooms apply.  Please refer to the [facility] website for the visitor guidelines for Inpatients (after your surgery is over and you are in a regular room).    Special instructions:   [place]- Preparing For Surgery  Before surgery, you can play an important role. Because skin is not sterile, your skin needs to be as free of germs as possible. You can reduce the number of germs on your skin by washing with CHG (chlorahexidine gluconate) Soap before surgery.  CHG is an antiseptic cleaner which kills germs and bonds with the skin to continue killing germs even after washing.    Oral Hygiene is also important to reduce your risk of infection.  Remember - BRUSH YOUR TEETH THE MORNING OF SURGERY WITH YOUR REGULAR TOOTHPASTE  Please do not use if you have an allergy to CHG or antibacterial  soaps. If your skin becomes reddened/irritated stop using the CHG.  Do not shave (including legs and underarms) for at least 48 hours prior to first CHG shower. It is OK to shave your face.  Please follow these instructions carefully.   Shower the NIGHT BEFORE SURGERY and the MORNING OF SURGERY  If you chose to wash your hair, wash your hair first as usual with your normal shampoo.  After you shampoo, rinse your hair and body thoroughly to remove the shampoo.  Use CHG Soap as you would any other liquid soap. You can apply CHG directly to the  skin and wash gently with a scrungie or a clean washcloth.   Apply the CHG Soap to your body ONLY FROM THE NECK DOWN.  Do not use on open wounds or open sores. Avoid contact with your eyes, ears, mouth and genitals (private parts). Wash Face and genitals (private parts)  with your normal soap.   Wash thoroughly, paying special attention to the area where your surgery will be performed.  Thoroughly rinse your body with warm water from the neck down.  DO NOT shower/wash with your normal soap after using and rinsing off the CHG Soap.  Pat yourself dry with a CLEAN TOWEL.  Wear CLEAN PAJAMAS to bed the night before surgery  Place CLEAN SHEETS on your bed the night before your surgery  DO NOT SLEEP WITH PETS.   Day of Surgery: Take a shower with CHG soap. Do not wear jewelry or makeup Do not wear lotions, powders, perfumes, or deodorant. Do not shave 48 hours prior to surgery.   Do not bring valuables to the hospital.  [facility] is not responsible for any belongings or valuables. Do not wear nail polish, gel polish, artificial nails, or any other type of covering on natural nails (fingers and toes) If you have artificial nails or gel coating that need to be removed by a nail salon, please have this removed prior to surgery. Artificial nails or gel coating may interfere with anesthesia's ability to adequately monitor your vital signs. Wear Clean/Comfortable clothing the morning of surgery Remember to brush your teeth WITH YOUR REGULAR TOOTHPASTE.   Please read over the following fact sheets that you were given.    If you received a COVID test during your pre-op visit  it is requested that you wear a mask when out in public, stay away from anyone that may not be feeling well and notify your surgeon if you develop symptoms. If you have been in contact with anyone that has tested positive in the last 10 days please notify you surgeon.

## 2022-03-19 ENCOUNTER — Encounter (HOSPITAL_COMMUNITY)
Admission: RE | Admit: 2022-03-19 | Discharge: 2022-03-19 | Disposition: A | Discharge status: Home / Self Care | Payer: Medicare HMO | Source: Ambulatory Visit | Attending: Orthopaedic Surgery | Admitting: Orthopaedic Surgery

## 2022-03-19 ENCOUNTER — Encounter: Payer: Self-pay | Admitting: Orthopaedic Surgery

## 2022-03-19 ENCOUNTER — Other Ambulatory Visit: Payer: Self-pay

## 2022-03-19 ENCOUNTER — Encounter (HOSPITAL_COMMUNITY): Payer: Self-pay

## 2022-03-19 VITALS — BP 133/67 | HR 77 | Temp 97.7°F | Resp 18 | Ht 62.0 in | Wt 206.5 lb

## 2022-03-19 DIAGNOSIS — Z87891 Personal history of nicotine dependence: Secondary | ICD-10-CM | POA: Diagnosis not present

## 2022-03-19 DIAGNOSIS — I2510 Atherosclerotic heart disease of native coronary artery without angina pectoris: Secondary | ICD-10-CM | POA: Insufficient documentation

## 2022-03-19 DIAGNOSIS — M1712 Unilateral primary osteoarthritis, left knee: Secondary | ICD-10-CM | POA: Diagnosis not present

## 2022-03-19 DIAGNOSIS — I10 Essential (primary) hypertension: Secondary | ICD-10-CM | POA: Diagnosis not present

## 2022-03-19 DIAGNOSIS — Z01818 Encounter for other preprocedural examination: Secondary | ICD-10-CM | POA: Diagnosis not present

## 2022-03-19 HISTORY — DX: Anemia, unspecified: D64.9

## 2022-03-19 LAB — TYPE AND SCREEN
ABO/RH(D): O POS
Antibody Screen: NEGATIVE

## 2022-03-19 LAB — CBC
HCT: 38.2 % (ref 36.0–46.0)
Hemoglobin: 12.3 g/dL (ref 12.0–15.0)
MCH: 30.6 pg (ref 26.0–34.0)
MCHC: 32.2 g/dL (ref 30.0–36.0)
MCV: 95 fL (ref 80.0–100.0)
Platelets: 215 10*3/uL (ref 150–400)
RBC: 4.02 MIL/uL (ref 3.87–5.11)
RDW: 11.9 % (ref 11.5–15.5)
WBC: 3.8 10*3/uL — ABNORMAL LOW (ref 4.0–10.5)
nRBC: 0 % (ref 0.0–0.2)

## 2022-03-19 LAB — SURGICAL PCR SCREEN
MRSA, PCR: NEGATIVE
Staphylococcus aureus: NEGATIVE

## 2022-03-19 LAB — BASIC METABOLIC PANEL
Anion gap: 7 (ref 5–15)
BUN: 11 mg/dL (ref 8–23)
CO2: 25 mmol/L (ref 22–32)
Calcium: 9.2 mg/dL (ref 8.9–10.3)
Chloride: 110 mmol/L (ref 98–111)
Creatinine, Ser: 0.75 mg/dL (ref 0.44–1.00)
GFR, Estimated: >60 mL/min (ref >60)
Glucose, Bld: 106 mg/dL — ABNORMAL HIGH (ref 70–99)
Potassium: 3.7 mmol/L (ref 3.5–5.1)
Sodium: 142 mmol/L (ref 135–145)

## 2022-03-19 NOTE — Progress Notes (Addendum)
PCP - Dr. Riki Sheer Cardiologist - denies  PPM/ICD - n/a  Chest x-ray - n/a EKG - 03/19/22 Stress Test - denies ECHO - denies Cardiac Cath - denies  Sleep Study - denies CPAP - denies  Blood Thinner Instructions: n/a Aspirin Instructions: n/a  ERAS Protcol -Clear liquids until 1140 DOS PRE-SURGERY Ensure or G2- Ensure provided.  COVID TEST- n/a  Anesthesia review: Yes, EKG review.   Patient denies shortness of breath, fever, cough and chest pain at PAT appointment   All instructions explained to the patient, with a verbal understanding of the material. Patient agrees to go over the instructions while at home for a better understanding. Patient also instructed to self quarantine after being tested for COVID-19. The opportunity to ask questions was provided.

## 2022-03-20 NOTE — Anesthesia Preprocedure Evaluation (Addendum)
Anesthesia Evaluation  Patient identified by MRN, date of birth, ID band Patient awake    Reviewed: Allergy & Precautions, H&P , NPO status , Patient's Chart, lab work & pertinent test results, reviewed documented beta blocker date and time   History of Anesthesia Complications (+) history of anesthetic complications  Airway Mallampati: I  TM Distance: >3 FB Neck ROM: full    Dental no notable dental hx. (+) Edentulous Upper, Partial Lower,    Pulmonary neg pulmonary ROS, Patient abstained from smoking., former smoker,    Pulmonary exam normal breath sounds clear to auscultation       Cardiovascular Exercise Tolerance: Good hypertension, Pt. on medications  Rhythm:regular Rate:Normal     Neuro/Psych negative neurological ROS  negative psych ROS   GI/Hepatic Neg liver ROS, GERD  Controlled,  Endo/Other  negative endocrine ROS  Renal/GU negative Renal ROS  negative genitourinary   Musculoskeletal  (+) Arthritis , Osteoarthritis,    Abdominal   Peds  Hematology  (+) Blood dyscrasia, ,   Anesthesia Other Findings   Reproductive/Obstetrics negative OB ROS                           Anesthesia Physical Anesthesia Plan  ASA: 2  Anesthesia Plan: MAC, Regional and Spinal   Post-op Pain Management: Regional block* and Minimal or no pain anticipated   Induction: Intravenous  PONV Risk Score and Plan: 2 and Ondansetron, Dexamethasone, Midazolam and Propofol infusion  Airway Management Planned: Natural Airway, Nasal Cannula, Simple Face Mask and Mask  Additional Equipment: None  Intra-op Plan:   Post-operative Plan:   Informed Consent: I have reviewed the patients History and Physical, chart, labs and discussed the procedure including the risks, benefits and alternatives for the proposed anesthesia with the patient or authorized representative who has indicated his/her understanding and  acceptance.     Dental Advisory Given  Plan Discussed with: CRNA and Anesthesiologist  Anesthesia Plan Comments: (See APP note by Joslyn Hy, FNP  Pt is 76 years old with hx HTN. Quit smoking 2 years ago. BMI 38 I spoke with pt by telephone. She swims 5 days a week for 45 minutes at a time. Denies every experiencing chest pain or shortness of breath when exercising.  )       Anesthesia Quick Evaluation

## 2022-03-20 NOTE — Progress Notes (Signed)
Anesthesia Chart Review:   Case: [id number] Date/Time: 03/26/22 1426   Procedure: LEFT TOTAL KNEE ARTHROPLASTY (Left: Knee)   Anesthesia type: Choice   Pre-op diagnosis: OSTEOARTHRITIS / DEGENERATIVE JOINT DISEASE LEFT KNEE   Location: [facility] OR ROOM 02 / [place] [facility]   Surgeons: Mcarthur Rossetti, MD       DISCUSSION: Pt is 76 years old with hx HTN. Quit smoking 2 years ago. BMI 38  I spoke with pt by telephone. She swims 5 days a week for 45 minutes at a time. Denies every experiencing chest pain or shortness of breath when exercising.   Reviewed EKG with Dr. Therisa Doyne.   VS: BP 133/67   Pulse 77   Temp 36.5 C (Oral)   Resp 18   Ht '5\' 2"'$  (1.575 m)   Wt 93.7 kg   SpO2 98%   BMI 37.77 kg/m   PROVIDERS: - PCP is Shelda Pal, DO   LABS: Labs reviewed: Acceptable for surgery. (all labs ordered are listed, but only abnormal results are displayed)  Labs Reviewed  BASIC METABOLIC PANEL - Abnormal; Notable for the following components:      Result Value   Glucose, Bld 106 (*)    All other components within normal limits  CBC - Abnormal; Notable for the following components:   WBC 3.8 (*)    All other components within normal limits  SURGICAL PCR SCREEN  TYPE AND SCREEN     EKG 03/19/22: NSR. Anterior infarct , age undetermined   CV: N/A  Past Medical History:  Diagnosis Date   Anemia    Arthritis    Breast cancer ([place]) 2020   right breast   Gallstones 05/31/2018   GERD (gastroesophageal reflux disease)    Hypertension    Personal history of radiation therapy    PONV (postoperative nausea and vomiting)    Syphilis     Past Surgical History:  Procedure Laterality Date   BREAST LUMPECTOMY WITH RADIOACTIVE SEED AND SENTINEL LYMPH NODE BIOPSY Right 09/15/2019   Procedure: RIGHT BREAST LUMPECTOMY WITH RADIOACTIVE SEED AND SENTINEL LYMPH NODE BIOPSY;  Surgeon: Donnie Mesa, MD;  Location: [place];  Service: General;  Laterality:  Right;   CHOLECYSTECTOMY N/A 04/13/2019   Procedure: LAPAROSCOPIC CHOLECYSTECTOMY WITH INTRAOPERATIVE CHOLANGIOGRAM;  Surgeon: Donnie Mesa, MD;  Location: [place];  Service: General;  Laterality: N/A;   DILATION AND CURETTAGE OF UTERUS      MEDICATIONS:  acetaminophen (TYLENOL) 500 MG tablet   amLODipine (NORVASC) 5 MG tablet   fexofenadine (ALLEGRA) 180 MG tablet   No current facility-administered medications for this encounter.    If no changes, I anticipate pt can proceed with surgery as scheduled.   Willeen Cass, PhD, FNP-BC [facility] Short Stay Surgical Center/Anesthesiology Phone: [phone] 03/20/2022 1:50 PM

## 2022-03-22 ENCOUNTER — Telehealth: Payer: Self-pay | Admitting: *Deleted

## 2022-03-22 NOTE — Telephone Encounter (Signed)
Attempted Ortho bundle pre-op call to patient; no answer and left VM requesting call back. 

## 2022-03-25 ENCOUNTER — Telehealth: Payer: Self-pay | Admitting: *Deleted

## 2022-03-25 ENCOUNTER — Other Ambulatory Visit: Payer: Self-pay | Admitting: *Deleted

## 2022-03-25 DIAGNOSIS — M1712 Unilateral primary osteoarthritis, left knee: Secondary | ICD-10-CM

## 2022-03-25 NOTE — Telephone Encounter (Signed)
Ortho bundle Pre-op call completed. 

## 2022-03-26 ENCOUNTER — Encounter (HOSPITAL_COMMUNITY)
Admission: RE | Disposition: A | Discharge status: Home / Self Care | Payer: Self-pay | Source: Home / Self Care | Attending: Orthopaedic Surgery

## 2022-03-26 ENCOUNTER — Ambulatory Visit (HOSPITAL_COMMUNITY): Payer: Medicare HMO

## 2022-03-26 ENCOUNTER — Ambulatory Visit (HOSPITAL_COMMUNITY): Payer: Medicare HMO | Admitting: Emergency Medicine

## 2022-03-26 ENCOUNTER — Encounter (HOSPITAL_COMMUNITY): Payer: Self-pay | Admitting: Orthopaedic Surgery

## 2022-03-26 ENCOUNTER — Observation Stay (HOSPITAL_COMMUNITY)
Admission: RE | Admit: 2022-03-26 | Discharge: 2022-03-28 | Disposition: A | Discharge status: Home / Self Care | Payer: Medicare HMO | Attending: Orthopaedic Surgery | Admitting: Orthopaedic Surgery

## 2022-03-26 ENCOUNTER — Observation Stay (HOSPITAL_COMMUNITY): Payer: Medicare HMO

## 2022-03-26 ENCOUNTER — Ambulatory Visit (HOSPITAL_BASED_OUTPATIENT_CLINIC_OR_DEPARTMENT_OTHER): Payer: Medicare HMO | Admitting: Anesthesiology

## 2022-03-26 ENCOUNTER — Other Ambulatory Visit: Payer: Self-pay

## 2022-03-26 DIAGNOSIS — Z96652 Presence of left artificial knee joint: Secondary | ICD-10-CM

## 2022-03-26 DIAGNOSIS — M1712 Unilateral primary osteoarthritis, left knee: Secondary | ICD-10-CM | POA: Diagnosis not present

## 2022-03-26 DIAGNOSIS — G8918 Other acute postprocedural pain: Secondary | ICD-10-CM | POA: Diagnosis not present

## 2022-03-26 DIAGNOSIS — I10 Essential (primary) hypertension: Secondary | ICD-10-CM | POA: Diagnosis not present

## 2022-03-26 DIAGNOSIS — Z87891 Personal history of nicotine dependence: Secondary | ICD-10-CM | POA: Insufficient documentation

## 2022-03-26 DIAGNOSIS — Z853 Personal history of malignant neoplasm of breast: Secondary | ICD-10-CM | POA: Insufficient documentation

## 2022-03-26 DIAGNOSIS — Z471 Aftercare following joint replacement surgery: Secondary | ICD-10-CM | POA: Diagnosis not present

## 2022-03-26 HISTORY — PX: TOTAL KNEE ARTHROPLASTY: SHX125

## 2022-03-26 LAB — ABO/RH: ABO/RH(D): O POS

## 2022-03-26 SURGERY — ARTHROPLASTY, KNEE, TOTAL
Anesthesia: Monitor Anesthesia Care | Site: Knee | Laterality: Left

## 2022-03-26 MED ORDER — FENTANYL CITRATE (PF) 100 MCG/2ML IJ SOLN: 25.0000 ug | INTRAMUSCULAR | Status: DC | PRN

## 2022-03-26 MED ORDER — ORAL CARE MOUTH RINSE: 15.0000 mL | Freq: Once | OROMUCOSAL | Status: DC

## 2022-03-26 MED ORDER — ONDANSETRON HCL 4 MG/2ML IJ SOLN
INTRAMUSCULAR | Status: AC
  Filled 2022-03-26: qty 2

## 2022-03-26 MED ORDER — LACTATED RINGERS IV SOLN: INTRAVENOUS | Status: DC

## 2022-03-26 MED ORDER — ACETAMINOPHEN 325 MG PO TABS: 325.0000 mg | ORAL_TABLET | Freq: Four times a day (QID) | ORAL | Status: DC | PRN

## 2022-03-26 MED ORDER — PROPOFOL 10 MG/ML IV BOLUS
INTRAVENOUS | Status: DC | PRN
  Administered 2022-03-26 (×2): 20 mg via INTRAVENOUS

## 2022-03-26 MED ORDER — ACETAMINOPHEN 325 MG PO TABS: 325.0000 mg | ORAL_TABLET | ORAL | Status: DC | PRN

## 2022-03-26 MED ORDER — MIDAZOLAM HCL 2 MG/2ML IJ SOLN
INTRAMUSCULAR | Status: AC
  Administered 2022-03-26: 2 mg via INTRAVENOUS
  Filled 2022-03-26: qty 2

## 2022-03-26 MED ORDER — PROPOFOL 500 MG/50ML IV EMUL
INTRAVENOUS | Status: DC | PRN
  Administered 2022-03-26: 75 ug/kg/min via INTRAVENOUS
  Administered 2022-03-26: 100 ug/kg/min via INTRAVENOUS

## 2022-03-26 MED ORDER — ONDANSETRON HCL 4 MG/2ML IJ SOLN
INTRAMUSCULAR | Status: DC | PRN
  Administered 2022-03-26: 4 mg via INTRAVENOUS

## 2022-03-26 MED ORDER — METHOCARBAMOL 500 MG PO TABS
500.0000 mg | ORAL_TABLET | Freq: Four times a day (QID) | ORAL | Status: DC | PRN
  Administered 2022-03-26 – 2022-03-28 (×3): 500 mg via ORAL
  Filled 2022-03-26 (×3): qty 1

## 2022-03-26 MED ORDER — ONDANSETRON HCL 4 MG/2ML IJ SOLN: 4.0000 mg | Freq: Four times a day (QID) | INTRAMUSCULAR | Status: DC | PRN

## 2022-03-26 MED ORDER — METOCLOPRAMIDE HCL 5 MG PO TABS: 5.0000 mg | ORAL_TABLET | Freq: Three times a day (TID) | ORAL | Status: DC | PRN

## 2022-03-26 MED ORDER — ORAL CARE MOUTH RINSE
15.0000 mL | Freq: Once | OROMUCOSAL | Status: AC
Start: 2022-03-26 — End: 2022-03-26

## 2022-03-26 MED ORDER — BUPIVACAINE IN DEXTROSE 0.75-8.25 % IT SOLN
INTRATHECAL | Status: DC | PRN
  Administered 2022-03-26: 1.8 mL via INTRATHECAL

## 2022-03-26 MED ORDER — PHENYLEPHRINE HCL-NACL 20-0.9 MG/250ML-% IV SOLN
INTRAVENOUS | Status: DC | PRN
  Administered 2022-03-26: 50 ug/min via INTRAVENOUS

## 2022-03-26 MED ORDER — POVIDONE-IODINE 10 % EX SWAB
2.0000 "application " | Freq: Once | CUTANEOUS | Status: DC
  Administered 2022-03-26: 2 via TOPICAL

## 2022-03-26 MED ORDER — ASPIRIN 81 MG PO CHEW
81.0000 mg | CHEWABLE_TABLET | Freq: Two times a day (BID) | ORAL | Status: DC
  Administered 2022-03-26 – 2022-03-28 (×4): 81 mg via ORAL
  Filled 2022-03-26 (×4): qty 1

## 2022-03-26 MED ORDER — DEXAMETHASONE SODIUM PHOSPHATE 10 MG/ML IJ SOLN
INTRAMUSCULAR | Status: AC
  Filled 2022-03-26: qty 1

## 2022-03-26 MED ORDER — OXYCODONE HCL 5 MG PO TABS: 5.0000 mg | ORAL_TABLET | Freq: Once | ORAL | Status: DC | PRN

## 2022-03-26 MED ORDER — MIDAZOLAM HCL 2 MG/2ML IJ SOLN
INTRAMUSCULAR | Status: AC
Start: 2022-03-26 — End: ?
  Filled 2022-03-26: qty 2

## 2022-03-26 MED ORDER — FENTANYL CITRATE (PF) 100 MCG/2ML IJ SOLN
INTRAMUSCULAR | Status: AC
  Administered 2022-03-26: 100 ug via INTRAVENOUS
  Filled 2022-03-26: qty 2

## 2022-03-26 MED ORDER — FENTANYL CITRATE (PF) 100 MCG/2ML IJ SOLN: 100.0000 ug | Freq: Once | INTRAMUSCULAR | Status: AC

## 2022-03-26 MED ORDER — MENTHOL 3 MG MT LOZG: 1.0000 | LOZENGE | OROMUCOSAL | Status: DC | PRN

## 2022-03-26 MED ORDER — ROPIVACAINE HCL 5 MG/ML IJ SOLN
INTRAMUSCULAR | Status: DC | PRN
  Administered 2022-03-26: 30 mL via EPIDURAL

## 2022-03-26 MED ORDER — ALUM & MAG HYDROXIDE-SIMETH 200-200-20 MG/5ML PO SUSP: 30.0000 mL | ORAL | Status: DC | PRN

## 2022-03-26 MED ORDER — LACTATED RINGERS IV SOLN
INTRAVENOUS | Status: DC
Start: 2022-03-26 — End: 2022-03-26

## 2022-03-26 MED ORDER — OXYCODONE HCL 5 MG/5ML PO SOLN: 5.0000 mg | Freq: Once | ORAL | Status: DC | PRN

## 2022-03-26 MED ORDER — FENTANYL CITRATE (PF) 250 MCG/5ML IJ SOLN
INTRAMUSCULAR | Status: AC
  Filled 2022-03-26: qty 5

## 2022-03-26 MED ORDER — PROPOFOL 1000 MG/100ML IV EMUL
INTRAVENOUS | Status: AC
  Filled 2022-03-26: qty 100

## 2022-03-26 MED ORDER — CHLORHEXIDINE GLUCONATE 0.12 % MT SOLN
15.0000 mL | Freq: Once | OROMUCOSAL | Status: AC
  Administered 2022-03-26: 15 mL via OROMUCOSAL
  Filled 2022-03-26: qty 15

## 2022-03-26 MED ORDER — PANTOPRAZOLE SODIUM 40 MG PO TBEC
40.0000 mg | DELAYED_RELEASE_TABLET | Freq: Every day | ORAL | Status: DC
  Administered 2022-03-26 – 2022-03-28 (×3): 40 mg via ORAL
  Filled 2022-03-26 (×3): qty 1

## 2022-03-26 MED ORDER — DIPHENHYDRAMINE HCL 12.5 MG/5ML PO ELIX: 12.5000 mg | ORAL_SOLUTION | ORAL | Status: DC | PRN

## 2022-03-26 MED ORDER — ONDANSETRON HCL 4 MG PO TABS: 4.0000 mg | ORAL_TABLET | Freq: Four times a day (QID) | ORAL | Status: DC | PRN

## 2022-03-26 MED ORDER — CEFAZOLIN SODIUM-DEXTROSE 2-4 GM/100ML-% IV SOLN
2.0000 g | INTRAVENOUS | Status: AC
  Administered 2022-03-26: 2 g via INTRAVENOUS
  Filled 2022-03-26: qty 100

## 2022-03-26 MED ORDER — DOCUSATE SODIUM 100 MG PO CAPS
100.0000 mg | ORAL_CAPSULE | Freq: Two times a day (BID) | ORAL | Status: DC
  Administered 2022-03-26 – 2022-03-28 (×4): 100 mg via ORAL
  Filled 2022-03-26 (×4): qty 1

## 2022-03-26 MED ORDER — POVIDONE-IODINE 10 % EX SWAB
2.0000 | Freq: Once | CUTANEOUS | Status: AC
  Administered 2022-03-26: 2 via TOPICAL

## 2022-03-26 MED ORDER — OXYCODONE HCL 5 MG PO TABS
10.0000 mg | ORAL_TABLET | ORAL | Status: DC | PRN
  Administered 2022-03-27 (×2): 10 mg via ORAL

## 2022-03-26 MED ORDER — METOCLOPRAMIDE HCL 5 MG/ML IJ SOLN: 5.0000 mg | Freq: Three times a day (TID) | INTRAMUSCULAR | Status: DC | PRN

## 2022-03-26 MED ORDER — OXYCODONE HCL 5 MG PO TABS
5.0000 mg | ORAL_TABLET | ORAL | Status: DC | PRN
  Administered 2022-03-28: 5 mg via ORAL
  Filled 2022-03-26 (×2): qty 2
  Filled 2022-03-26: qty 1

## 2022-03-26 MED ORDER — HYDROMORPHONE HCL 1 MG/ML IJ SOLN
0.5000 mg | INTRAMUSCULAR | Status: DC | PRN
  Administered 2022-03-27: 1 mg via INTRAVENOUS
  Filled 2022-03-26: qty 1

## 2022-03-26 MED ORDER — TRANEXAMIC ACID-NACL 1000-0.7 MG/100ML-% IV SOLN
1000.0000 mg | INTRAVENOUS | Status: AC
  Administered 2022-03-26: 1000 mg via INTRAVENOUS
  Filled 2022-03-26: qty 100

## 2022-03-26 MED ORDER — ONDANSETRON HCL 4 MG/2ML IJ SOLN: 4.0000 mg | Freq: Once | INTRAMUSCULAR | Status: DC | PRN

## 2022-03-26 MED ORDER — PHENOL 1.4 % MT LIQD
1.0000 | OROMUCOSAL | Status: DC | PRN
Start: 2022-03-26 — End: 2022-03-28

## 2022-03-26 MED ORDER — PROPOFOL 10 MG/ML IV BOLUS
INTRAVENOUS | Status: AC
  Filled 2022-03-26: qty 20

## 2022-03-26 MED ORDER — AMLODIPINE BESYLATE 5 MG PO TABS
5.0000 mg | ORAL_TABLET | Freq: Every day | ORAL | Status: DC
  Administered 2022-03-27 – 2022-03-28 (×2): 5 mg via ORAL
  Filled 2022-03-26 (×2): qty 1

## 2022-03-26 MED ORDER — DEXAMETHASONE SODIUM PHOSPHATE 10 MG/ML IJ SOLN
INTRAMUSCULAR | Status: DC | PRN
  Administered 2022-03-26: 5 mg via INTRAVENOUS

## 2022-03-26 MED ORDER — CHLORHEXIDINE GLUCONATE 0.12 % MT SOLN: 15.0000 mL | Freq: Once | OROMUCOSAL | Status: DC

## 2022-03-26 MED ORDER — ACETAMINOPHEN 160 MG/5ML PO SOLN: 325.0000 mg | ORAL | Status: DC | PRN

## 2022-03-26 MED ORDER — MEPERIDINE HCL 25 MG/ML IJ SOLN: 6.2500 mg | INTRAMUSCULAR | Status: DC | PRN

## 2022-03-26 MED ORDER — MIDAZOLAM HCL 2 MG/2ML IJ SOLN
2.0000 mg | Freq: Once | INTRAMUSCULAR | Status: AC
Start: 2022-03-26 — End: 2022-03-26

## 2022-03-26 MED ORDER — METHOCARBAMOL 1000 MG/10ML IJ SOLN
500.0000 mg | Freq: Four times a day (QID) | INTRAVENOUS | Status: DC | PRN
  Filled 2022-03-26: qty 5

## 2022-03-26 SURGICAL SUPPLY — 73 items
BAG COUNTER SPONGE SURGICOUNT (BAG) ×2 IMPLANT
BANDAGE ESMARK 6X9 LF (GAUZE/BANDAGES/DRESSINGS) ×1 IMPLANT
BLADE SAG 18X100X1.27 (BLADE) ×2 IMPLANT
BNDG ELASTIC 6X15 VLCR STRL LF (GAUZE/BANDAGES/DRESSINGS) ×2 IMPLANT
BNDG ELASTIC 6X5.8 VLCR STR LF (GAUZE/BANDAGES/DRESSINGS) ×4 IMPLANT
BNDG ESMARK 6X9 LF (GAUZE/BANDAGES/DRESSINGS) ×2
BOWL SMART MIX CTS (DISPOSABLE) ×2 IMPLANT
CEMENT BONE R 1X40 (Cement) ×4 IMPLANT
COVER SURGICAL LIGHT HANDLE (MISCELLANEOUS) ×2 IMPLANT
CUFF TOURN SGL QUICK 34 (TOURNIQUET CUFF) ×1
CUFF TOURN SGL QUICK 42 (TOURNIQUET CUFF) IMPLANT
CUFF TRNQT CYL 34X4.125X (TOURNIQUET CUFF) ×1 IMPLANT
DRAPE EXTREMITY T 121X128X90 (DISPOSABLE) ×2 IMPLANT
DRAPE HALF SHEET 40X57 (DRAPES) ×2 IMPLANT
DRAPE U-SHAPE 47X51 STRL (DRAPES) ×2 IMPLANT
DRSG ADAPTIC 3X8 NADH LF (GAUZE/BANDAGES/DRESSINGS) ×1 IMPLANT
DRSG PAD ABDOMINAL 8X10 ST (GAUZE/BANDAGES/DRESSINGS) ×2 IMPLANT
DURAPREP 26ML APPLICATOR (WOUND CARE) ×2 IMPLANT
ELECT CAUTERY BLADE 6.4 (BLADE) ×2 IMPLANT
ELECT REM PT RETURN 9FT ADLT (ELECTROSURGICAL) ×2
ELECTRODE REM PT RTRN 9FT ADLT (ELECTROSURGICAL) ×1 IMPLANT
FACESHIELD WRAPAROUND (MASK) ×4 IMPLANT
FACESHIELD WRAPAROUND OR TEAM (MASK) ×2 IMPLANT
FEMUR CMT CR STD SZ 6 LT KNEE (Joint) ×2 IMPLANT
FEMUR CMTD CR STD SZ 6 LT KNEE (Joint) IMPLANT
GAUZE PAD ABD 8X10 STRL (GAUZE/BANDAGES/DRESSINGS) ×2 IMPLANT
GAUZE SPONGE 4X4 12PLY STRL (GAUZE/BANDAGES/DRESSINGS) ×2 IMPLANT
GAUZE XEROFORM 1X8 LF (GAUZE/BANDAGES/DRESSINGS) ×2 IMPLANT
GLOVE BIOGEL PI IND STRL 8 (GLOVE) ×2 IMPLANT
GLOVE BIOGEL PI INDICATOR 8 (GLOVE) ×2
GLOVE ORTHO TXT STRL SZ7.5 (GLOVE) ×2 IMPLANT
GLOVE SURG ORTHO 8.0 STRL STRW (GLOVE) ×2 IMPLANT
GOWN STRL REUS W/ TWL LRG LVL3 (GOWN DISPOSABLE) IMPLANT
GOWN STRL REUS W/ TWL XL LVL3 (GOWN DISPOSABLE) ×2 IMPLANT
GOWN STRL REUS W/TWL LRG LVL3 (GOWN DISPOSABLE)
GOWN STRL REUS W/TWL XL LVL3 (GOWN DISPOSABLE) ×2
HANDPIECE INTERPULSE COAX TIP (DISPOSABLE) ×1
HDLS TROCR DRIL PIN KNEE 75 (PIN) ×1
IMMOBILIZER KNEE 22 UNIV (SOFTGOODS) ×2 IMPLANT
INSERT TIB ARTSURF SZ 6-7 16 (Joint) ×1 IMPLANT
IV NS 1000ML (IV SOLUTION) ×1
IV NS 1000ML BAXH (IV SOLUTION) ×1 IMPLANT
KIT BASIN OR (CUSTOM PROCEDURE TRAY) ×2 IMPLANT
KIT TURNOVER KIT B (KITS) ×2 IMPLANT
MANIFOLD NEPTUNE II (INSTRUMENTS) ×2 IMPLANT
NDL 18GX1X1/2 (RX/OR ONLY) (NEEDLE) IMPLANT
NEEDLE 18GX1X1/2 (RX/OR ONLY) (NEEDLE) IMPLANT
NS IRRIG 1000ML POUR BTL (IV SOLUTION) ×2 IMPLANT
PACK TOTAL JOINT (CUSTOM PROCEDURE TRAY) ×2 IMPLANT
PAD ARMBOARD 7.5X6 YLW CONV (MISCELLANEOUS) ×2 IMPLANT
PADDING CAST COTTON 6X4 STRL (CAST SUPPLIES) ×3 IMPLANT
PIN DRILL HDLS TROCAR 75 4PK (PIN) IMPLANT
SCREW FEMALE HEX FIX 25X2.5 (ORTHOPEDIC DISPOSABLE SUPPLIES) ×1 IMPLANT
SET HNDPC FAN SPRY TIP SCT (DISPOSABLE) ×1 IMPLANT
SET PAD KNEE POSITIONER (MISCELLANEOUS) ×2 IMPLANT
STAPLER VISISTAT 35W (STAPLE) ×2 IMPLANT
STEM POLY PAT PLY 32M KNEE (Knees) ×1 IMPLANT
STEM TIB ST PERS 14+30 (Stem) ×1 IMPLANT
STEM TIBIA 5 DEG SZ E L KNEE (Knees) IMPLANT
SUCTION FRAZIER HANDLE 10FR (MISCELLANEOUS) ×1
SUCTION TUBE FRAZIER 10FR DISP (MISCELLANEOUS) ×1 IMPLANT
SUT VIC AB 0 CT1 27 (SUTURE) ×1
SUT VIC AB 0 CT1 27XBRD ANBCTR (SUTURE) ×1 IMPLANT
SUT VIC AB 1 CT1 27 (SUTURE) ×2
SUT VIC AB 1 CT1 27XBRD ANBCTR (SUTURE) ×2 IMPLANT
SUT VIC AB 2-0 CT1 27 (SUTURE) ×2
SUT VIC AB 2-0 CT1 TAPERPNT 27 (SUTURE) ×2 IMPLANT
SYR 50ML LL SCALE MARK (SYRINGE) IMPLANT
TIBIA STEM 5 DEG SZ E L KNEE (Knees) ×2 IMPLANT
TOWEL GREEN STERILE (TOWEL DISPOSABLE) ×2 IMPLANT
TOWEL GREEN STERILE FF (TOWEL DISPOSABLE) ×2 IMPLANT
TRAY CATH 16FR W/PLASTIC CATH (SET/KITS/TRAYS/PACK) IMPLANT
WRAP KNEE MAXI GEL POST OP (GAUZE/BANDAGES/DRESSINGS) ×2 IMPLANT

## 2022-03-26 NOTE — Brief Op Note (Signed)
03/26/2022  2:16 PM  PATIENT:  Kelly Dixon  76 y.o. female  PRE-OPERATIVE DIAGNOSIS:  OSTEOARTHRITIS / DEGENERATIVE JOINT DISEASE LEFT KNEE  POST-OPERATIVE DIAGNOSIS:  OSTEOARTHRITIS / DEGENERATIVE JOINT DISEASE LEFT KNEE  PROCEDURE:  Procedure(s): LEFT TOTAL KNEE ARTHROPLASTY (Left)  SURGEON:  Surgeon(s) and Role:    Kathryne Hitch, MD - Primary  PHYSICIAN ASSISTANT:  Rexene Edison, PA-C  ANESTHESIA:   regional and spinal  COUNTS:  YES  TOURNIQUET:   Total Tourniquet Time Documented: Thigh (Left) - 69 minutes Total: Thigh (Left) - 69 minutes   DICTATION: .Other Dictation: Dictation Number [id number]  PLAN OF CARE: Admit for overnight observation  PATIENT DISPOSITION:  PACU - hemodynamically stable.   Delay start of Pharmacological VTE agent (>24hrs) due to surgical blood loss or risk of bleeding: no

## 2022-03-26 NOTE — Op Note (Signed)
NAME: Kelly Dixon, Kelly Dixon MEDICAL RECORD NO: [id number] ACCOUNT NO: [id number] DATE OF BIRTH: Jan 31, 1946 FACILITY: [facility] LOCATION: [facility] PHYSICIAN: Lind Guest. Ninfa Linden, MD  Operative Report   DATE OF PROCEDURE: 03/26/2022  PREOPERATIVE DIAGNOSIS:  Primary osteoarthritis and degenerative joint disease, left knee.  POSTOPERATIVE DIAGNOSIS:  Primary osteoarthritis and degenerative joint disease, left knee.  PROCEDURE:  Cemented left total knee arthroplasty.  IMPLANTS:  Biomet/Zimmer Persona knee system with size 6 left standard CR femur, size E left tibial tray, size 16 mm left medial congruent polyethylene insert, 32 mm patellar button.  SURGEON:  Lind Guest. Ninfa Linden, MD  ASSISTANT:  Erskine Emery, PA-C  ANESTHESIA:   1.  Left lower extremity adductor canal block. 2.  Spinal.  ANTIBIOTICS:  2 g IV Ancef.  ESTIMATED BLOOD LOSS:  Less than 100 mL  TOURNIQUET TIME:  Less than an hour and a half.  COMPLICATIONS:  None.  INDICATIONS:  The patient is a 76 year old morbidly obese female with BMI of 37 and unfortunately has severe arthritis involving both of her knees.  Her left knee is hurting worse than the right knee.  There is significant deformity on x-rays and her  clinical exam shows severe pain.  At this point, her left knee pain is daily and is detrimentally affecting her mobility, her quality of life and activities of daily living to the point she does wish to proceed with a knee replacement.  We agree with  this as well considering her x-ray findings and clinical exam findings combined with failure of conservative treatment.  We did talk in length and detail about the risk of acute blood loss anemia, nerve or vessel injury, fracture, infection, DVT, implant  failure and skin and soft tissue issues.  We talked about our goals being decreased pain, improve mobility and hopefully overall improve quality of life.  DESCRIPTION OF PROCEDURE:  After informed consent was obtained,  appropriate left knee was marked and adductor canal block was obtained in the left lower extremity in the holding room.  She was then brought to the operating room and sat up on the  operating table where spinal anesthesia was obtained.  She was laid in supine position on the operating table.  A nonsterile tourniquet was placed around her upper left thigh and her left thigh, knee, leg, ankle and foot were prepped and draped with  DuraPrep and sterile drapes including a sterile stockinette.  A timeout was called.  She was identified correct patient, correct left knee.  We then used Esmarch to wrap that leg and tourniquet was inflated to 300 mm of pressure.  We then made a direct  midline incision over the patella and carried this proximally and distally. We dissected down to the knee joint, carried out a medial parapatellar arthrotomy.  We found significant deformity and osteoarthritis of the left knee.  We removed osteophytes  from all 3 compartments as well as remnants of the ACL, medial and lateral meniscus.  We then had the knee in a flexed position and made our extramedullary tibial cut for correction of varus and valgus and 3 degrees slope.  We set this cut for taking 2  mm off the low side and 10 mm off the high side.  We made this cut without difficulty and ended up being much larger tibia cut than I expected.  We then used the intramedullary guide for making our distal femur cut, setting this for a left knee at 5  degrees externally rotated.  We made  this 10 mm distal femoral cut without difficulty, and brought the knee back down to full extension and it took at least a 14 mm extension block to have her full extension and not hyperextending. We then went back to  the femur and put our femoral sizing guide based off the epicondylar axis.  Based off of this, we chose a size 6 femur.  We put a 4-in-1 cutting block for a size 6 femur and we made our anterior and posterior cuts, followed by our chamfer  cuts.  We then  went back to the tibia and chose a size E tibial tray for left knee for coverage over the tibial plateau.  We set the rotation off the tibial tubercle and the femur and made our keel punch and drill hole off of this.  With the trial E left tibia, we  trialed a left 6 CR standard femur and we went up to a 16 mm medial congruent polyethylene insert.  We felt this gave her good stability with continued good range of motion.  We then made our patellar cut and drilled three holes for size 32 patellar  button.  Again, we put her through range of motion with all trial components in the knee and we were pleased with range of motion and stability.  We then removed all trial instrumentation from the knee. We irrigated the knee with normal saline solution  using pulsatile lavage and then dried the knee real well.  We put the knee in a flexed position, we then mixed our cement and cemented the Biomet Zimmer Persona tibial tray for left knee, size E, followed by cementing our size 6 left CR standard femur.   We placed our 16 mm medial congruent polyethylene insert and cemented our 32 mm patellar button.  We then held the knee in full extension with compression and need to allow the cement to harden.  Once it hardened, we put the knee through range of motion  and we felt good about the range of motion and stability.  We then let the tourniquet down.  Hemostasis was obtained with electrocautery.  We closed the arthrotomy with interrupted #1 Vicryl suture followed by 0 Vicryl to close deep tissue and 2-0 Vicryl  to close subcutaneous tissue.  The skin was closed with staples.  Well-padded sterile dressing was applied.  She was taken to recovery room in stable condition with all final counts being correct.  No complications noted.  Of note, Benita Stabile, PA-C,  assisted during the entire case and assistance was crucial and medically necessary for assisting and helping out with this case with retracting of soft  tissues and helping guide implant placement as well as being directly involved with a layered closure  of the wound.   PUS D: 03/26/2022 2:14:57 pm T: 03/26/2022 9:46:00 pm  JOB: [id number]/ [id number]

## 2022-03-26 NOTE — Op Note (Signed)
NAME: Kelly Dixon, PRETTYMAN MEDICAL RECORD NO: [id number] ACCOUNT NO: [id number] DATE OF BIRTH: 1946/01/08 FACILITY: [facility] LOCATION: [facility] PHYSICIAN: Lind Guest. Ninfa Linden, MD  Operative Report   DATE OF PROCEDURE: 03/26/2022  ADDENDUM:  As the soft tissue on her left total knee arthroplasty was being closed, the scrub nurse noted that there was a piece of saw blade that had broken off and was missing.  Intraoperative x-ray was obtained sterilely that showed questionable whether or not  that piece of blade was in the soft tissue.  They eventually did find the piece of blade and it was not in the soft tissue.  The patient is a larger individual.  However, a subsequent x-ray intraoperative showed that there was certainly could be a piece  of the bone cement in the arthrotomy.  I felt it was better since the knee was not close to just open up the arthrotomy again and to explore the knee and take a look.  We opened up the arthrotomy and did not find any fragments of cement or of the metal  piece at all.  I then irrigated the knee again with normal saline solution using pulsatile lavage.  We dried the knee real well and then closed the arthrotomy with #1 Vicryl, followed by 0 Vicryl to close deep tissue and 2-0 Vicryl to close the  subcutaneous tissue and the skin was closed with staples.  Again, this is addendum to our previous operative note and there were no foreign bodies noted within the knee.   VAI D: 03/26/2022 2:50:09 pm T: 03/26/2022 9:42:00 pm  JOB: [id number]/ [id number]

## 2022-03-27 ENCOUNTER — Encounter (HOSPITAL_COMMUNITY): Payer: Self-pay | Admitting: Orthopaedic Surgery

## 2022-03-27 ENCOUNTER — Other Ambulatory Visit: Payer: Self-pay

## 2022-03-27 DIAGNOSIS — Z87891 Personal history of nicotine dependence: Secondary | ICD-10-CM | POA: Diagnosis not present

## 2022-03-27 DIAGNOSIS — M1712 Unilateral primary osteoarthritis, left knee: Secondary | ICD-10-CM | POA: Diagnosis not present

## 2022-03-27 DIAGNOSIS — I10 Essential (primary) hypertension: Secondary | ICD-10-CM | POA: Diagnosis not present

## 2022-03-27 DIAGNOSIS — Z853 Personal history of malignant neoplasm of breast: Secondary | ICD-10-CM | POA: Diagnosis not present

## 2022-03-27 LAB — CBC
HCT: 34.5 % — ABNORMAL LOW (ref 36.0–46.0)
Hemoglobin: 11.3 g/dL — ABNORMAL LOW (ref 12.0–15.0)
MCH: 30.9 pg (ref 26.0–34.0)
MCHC: 32.8 g/dL (ref 30.0–36.0)
MCV: 94.3 fL (ref 80.0–100.0)
Platelets: 186 10*3/uL (ref 150–400)
RBC: 3.66 MIL/uL — ABNORMAL LOW (ref 3.87–5.11)
RDW: 12 % (ref 11.5–15.5)
WBC: 10.5 10*3/uL (ref 4.0–10.5)
nRBC: 0 % (ref 0.0–0.2)

## 2022-03-27 LAB — BASIC METABOLIC PANEL
Anion gap: 8 (ref 5–15)
BUN: 12 mg/dL (ref 8–23)
CO2: 24 mmol/L (ref 22–32)
Calcium: 8.9 mg/dL (ref 8.9–10.3)
Chloride: 106 mmol/L (ref 98–111)
Creatinine, Ser: 0.74 mg/dL (ref 0.44–1.00)
GFR, Estimated: >60 mL/min (ref >60)
Glucose, Bld: 160 mg/dL — ABNORMAL HIGH (ref 70–99)
Potassium: 4.1 mmol/L (ref 3.5–5.1)
Sodium: 138 mmol/L (ref 135–145)

## 2022-03-27 MED ORDER — KETOROLAC TROMETHAMINE 15 MG/ML IJ SOLN
7.5000 mg | Freq: Four times a day (QID) | INTRAMUSCULAR | Status: AC
Start: 2022-03-27 — End: 2022-03-28
  Administered 2022-03-27 – 2022-03-28 (×3): 7.5 mg via INTRAVENOUS
  Filled 2022-03-27 (×3): qty 1

## 2022-03-27 MED ORDER — ZOLPIDEM TARTRATE 5 MG PO TABS
5.0000 mg | ORAL_TABLET | Freq: Every evening | ORAL | Status: DC | PRN
  Administered 2022-03-27: 5 mg via ORAL
  Filled 2022-03-27: qty 1

## 2022-03-27 NOTE — Anesthesia Postprocedure Evaluation (Signed)
Anesthesia Post Note  Patient: Kelly Dixon  Procedure(s) Performed: LEFT TOTAL KNEE ARTHROPLASTY (Left: Knee)     Patient location during evaluation: PACU Anesthesia Type: Regional and Spinal Level of consciousness: awake and alert Pain management: pain level controlled Vital Signs Assessment: post-procedure vital signs reviewed and stable Respiratory status: spontaneous breathing, nonlabored ventilation, respiratory function stable and patient connected to nasal cannula oxygen Cardiovascular status: blood pressure returned to baseline and stable Postop Assessment: no apparent nausea or vomiting Anesthetic complications: no   No notable events documented.  Last Vitals:  Vitals:   03/27/22 1617 03/27/22 1944  BP: 117/66 115/80  Pulse: 81 97  Resp: 18 18  Temp: 36.8 C 36.5 C  SpO2: 97% 97%    Last Pain:  Vitals:   03/27/22 1944  TempSrc: Oral  PainSc: 7                  Jessia Kief

## 2022-03-27 NOTE — Progress Notes (Signed)
Physical Therapy Treatment Patient Details Name: Kelly Dixon MRN: [id number] DOB: Apr 16, 1946 Today's Date: 03/27/2022   History of Present Illness 76 y/o female admitted on 03/26/22 following L TKA. PMH: HTN, hx of breast cancer    PT Comments    Patient able to negotiate 8 stairs with L hand rail sideways and min guard. Instructed patient on safe stair negotiation with up with good and down with bad. Patient increased ambulation distance this session. Better pain control which made progressing possible. Encouraged patient to continue to performing exercises while in chair and in bed. D/c plan remains appropriate.     Recommendations for follow up therapy are one component of a multi-disciplinary discharge planning process, led by the attending physician.  Recommendations may be updated based on patient status, additional functional criteria and insurance authorization.  Follow Up Recommendations  Follow physician's recommendations for discharge plan and follow up therapies     Assistance Recommended at Discharge Frequent or constant Supervision/Assistance  Patient can return home with the following A little help with walking and/or transfers;A little help with bathing/dressing/bathroom;Assistance with cooking/housework;Assist for transportation;Help with stairs or ramp for entrance   Equipment Recommendations  Rolling Trulee Hamstra (2 wheels)    Recommendations for Other Services       Precautions / Restrictions Precautions Precautions: Fall;Knee Required Braces or Orthoses: Knee Immobilizer - Left Knee Immobilizer - Left: On when out of bed or walking Restrictions Weight Bearing Restrictions: Yes LLE Weight Bearing: Weight bearing as tolerated     Mobility  Bed Mobility Overal bed mobility: Needs Assistance Bed Mobility: Supine to Sit     Supine to sit: Supervision     General bed mobility comments: able to lift L LE and come to EOB without assistance     Transfers Overall transfer level: Needs assistance Equipment used: Rolling Majel Giel (2 wheels) Transfers: Sit to/from Stand Sit to Stand: Supervision           General transfer comment: supervision for safety    Ambulation/Gait Ambulation/Gait assistance: Supervision Gait Distance (Feet): 70 Feet Assistive device: Rolling Gesenia Bantz (2 wheels) Gait Pattern/deviations: Decreased stride length, Step-to pattern, Decreased stance time - left, Decreased weight shift to left, Antalgic Gait velocity: decreased     General Gait Details: supervision for safety. Improved gait quality this session but continues with step to pattern   Stairs Stairs: Yes Stairs assistance: Min guard Stair Management: One rail Left, Step to pattern, Sideways Number of Stairs: 8 General stair comments: able to ascend stairs sideways with use of L rail. Instructed on up with good and down with bad.   Wheelchair Mobility    Modified Rankin (Stroke Patients Only)       Balance Overall balance assessment: Needs assistance Sitting-balance support: No upper extremity supported, Feet supported Sitting balance-Leahy Scale: Good     Standing balance support: Bilateral upper extremity supported, Reliant on assistive device for balance Standing balance-Leahy Scale: Poor Standing balance comment: reliant on RW                            Cognition Arousal/Alertness: Awake/alert Behavior During Therapy: WFL for tasks assessed/performed Overall Cognitive Status: Within Functional Limits for tasks assessed                                          Exercises  General Comments        Pertinent Vitals/Pain Pain Assessment Pain Assessment: Faces Pain Score: 9  Faces Pain Scale: Hurts whole lot Pain Location: L knee Pain Descriptors / Indicators: Discomfort, Grimacing, Sore, Sharp Pain Intervention(s): Monitored during session, Repositioned    Home Living                           Prior Function            PT Goals (current goals can now be found in the care plan section) Acute Rehab PT Goals Patient Stated Goal: to reduce pain and be able to go home PT Goal Formulation: With patient Time For Goal Achievement: 04/10/22 Potential to Achieve Goals: Good Progress towards PT goals: Progressing toward goals    Frequency    7X/week      PT Plan Current plan remains appropriate    Co-evaluation              AM-PAC PT "6 Clicks" Mobility   Outcome Measure  Help needed turning from your back to your side while in a flat bed without using bedrails?: A Little Help needed moving from lying on your back to sitting on the side of a flat bed without using bedrails?: A Little Help needed moving to and from a bed to a chair (including a wheelchair)?: A Little Help needed standing up from a chair using your arms (e.g., wheelchair or bedside chair)?: A Little Help needed to walk in hospital room?: A Little Help needed climbing 3-5 steps with a railing? : A Little 6 Click Score: 18    End of Session Equipment Utilized During Treatment: Gait belt Activity Tolerance: Patient tolerated treatment well;Patient limited by pain Patient left: in chair;with call bell/phone within reach Nurse Communication: Mobility status PT Visit Diagnosis: Unsteadiness on feet (R26.81);Muscle weakness (generalized) (M62.81);Difficulty in walking, not elsewhere classified (R26.2);Pain Pain - Right/Left: Left Pain - part of body: Knee     Time: 9509-3267 PT Time Calculation (min) (ACUTE ONLY): 24 min  Charges:  $Gait Training: 23-37 mins                     Starsky Nanna A. Gilford Rile PT, DPT Acute Rehabilitation Services Office [phone]    Linna Hoff 03/27/2022, 2:52 PM

## 2022-03-27 NOTE — Evaluation (Signed)
Physical Therapy Evaluation Patient Details Name: Kelly Dixon MRN: [id number] DOB: 1946/01/03 Today's Date: 03/27/2022  History of Present Illness  76 y/o female admitted on 03/26/22 following L TKA. PMH: HTN, hx of breast cancer  Clinical Impression  Patient admitted following above procedure. PTA, patient lives with niece and was independent. Patient currently limited by functional deficits listed below. Patient limited by pain this AM. Able to perform transfers and in room ambulation with min guard and use of RW. Notified RN of patient requesting pain meds. Educated patient on L LE positioning, HEP, and importance of mobility, patient verbalized understanding. Patient will benefit from skilled PT services during acute stay to address listed deficits.        Recommendations for follow up therapy are one component of a multi-disciplinary discharge planning process, led by the attending physician.  Recommendations may be updated based on patient status, additional functional criteria and insurance authorization.  Follow Up Recommendations Follow physician's recommendations for discharge plan and follow up therapies      Assistance Recommended at Discharge Frequent or constant Supervision/Assistance  Patient can return home with the following  A little help with walking and/or transfers;A little help with bathing/dressing/bathroom;Assistance with cooking/housework;Assist for transportation;Help with stairs or ramp for entrance    Equipment Recommendations Rolling Aras Albarran (2 wheels)  Recommendations for Other Services       Functional Status Assessment Patient has had a recent decline in their functional status and demonstrates the ability to make significant improvements in function in a reasonable and predictable amount of time.     Precautions / Restrictions Precautions Precautions: Fall;Knee Required Braces or Orthoses: Knee Immobilizer - Left Knee Immobilizer - Left: On when out of  bed or walking Restrictions Weight Bearing Restrictions: Yes LLE Weight Bearing: Weight bearing as tolerated      Mobility  Bed Mobility Overal bed mobility: Needs Assistance Bed Mobility: Supine to Sit     Supine to sit: Supervision     General bed mobility comments: able to utilize UEs to assist L LE off bed. Supervision for safety    Transfers Overall transfer level: Needs assistance Equipment used: Rolling Sudie Bandel (2 wheels) Transfers: Sit to/from Stand Sit to Stand: Min guard           General transfer comment: min guard for safety. Cues for hand placement    Ambulation/Gait Ambulation/Gait assistance: Min guard Gait Distance (Feet): 35 Feet Assistive device: Rolling Alwyn Cordner (2 wheels) Gait Pattern/deviations: Decreased stride length, Step-to pattern, Decreased stance time - left, Decreased weight shift to left, Antalgic Gait velocity: decreased     General Gait Details: min guard for safety. Painful with weightbearing. Step to pattern. Cues for shorter step length as patient tending to take large steps  Stairs            Wheelchair Mobility    Modified Rankin (Stroke Patients Only)       Balance Overall balance assessment: Needs assistance Sitting-balance support: No upper extremity supported, Feet supported Sitting balance-Leahy Scale: Good     Standing balance support: Bilateral upper extremity supported, Reliant on assistive device for balance Standing balance-Leahy Scale: Poor Standing balance comment: reliant on RW                             Pertinent Vitals/Pain Pain Assessment Pain Assessment: 0-10 Pain Score: 9  Pain Location: L knee Pain Descriptors / Indicators: Discomfort, Grimacing, Sore, Sharp Pain Intervention(s): Monitored during  session, Repositioned, Limited activity within patient's tolerance, Patient requesting pain meds-RN notified    Home Living Family/patient expects to be discharged to:: Private  residence Living Arrangements: Other relatives (niece) Available Help at Discharge: Family;Friend(s);Available 24 hours/day Type of Home: House Home Access: Stairs to enter   [employer] of Steps: threshold Alternate Level Stairs-Number of Steps: flight Home Layout: Two level;Bed/bath upstairs Home Equipment: Toilet riser;Shower seat;Rolling Environmental consultant (2 wheels)      Prior Function Prior Level of Function : Independent/Modified Independent;Working/employed;Driving             Mobility Comments: working as Writer        Extremity/Trunk Assessment   Upper Extremity Assessment Upper Extremity Assessment: Overall WFL for tasks assessed    Lower Extremity Assessment Lower Extremity Assessment: LLE deficits/detail LLE Deficits / Details: s/p L TKA - limited eval due to pain. Able to flex to 40 degrees    Cervical / Trunk Assessment Cervical / Trunk Assessment: Normal  Communication   Communication: No difficulties  Cognition Arousal/Alertness: Awake/alert Behavior During Therapy: WFL for tasks assessed/performed Overall Cognitive Status: Within Functional Limits for tasks assessed                                          General Comments      Exercises     Assessment/Plan    PT Assessment Patient needs continued PT services  PT Problem List Decreased strength;Decreased range of motion;Decreased activity tolerance;Decreased balance;Decreased mobility;Decreased knowledge of precautions;Decreased knowledge of use of DME       PT Treatment Interventions Gait training;DME instruction;Stair training;Functional mobility training;Therapeutic activities;Therapeutic exercise;Balance training;Patient/family education    PT Goals (Current goals can be found in the Care Plan section)  Acute Rehab PT Goals Patient Stated Goal: to reduce pain and be able to go home PT Goal Formulation: With patient Time For Goal  Achievement: 04/10/22 Potential to Achieve Goals: Good    Frequency 7X/week     Co-evaluation               AM-PAC PT "6 Clicks" Mobility  Outcome Measure Help needed turning from your back to your side while in a flat bed without using bedrails?: A Little Help needed moving from lying on your back to sitting on the side of a flat bed without using bedrails?: A Little Help needed moving to and from a bed to a chair (including a wheelchair)?: A Little Help needed standing up from a chair using your arms (e.g., wheelchair or bedside chair)?: A Little Help needed to walk in hospital room?: A Little Help needed climbing 3-5 steps with a railing? : A Lot 6 Click Score: 17    End of Session Equipment Utilized During Treatment: Gait belt Activity Tolerance: Patient tolerated treatment well;Patient limited by pain Patient left: in chair;with call bell/phone within reach Nurse Communication: Patient requests pain meds;Mobility status PT Visit Diagnosis: Unsteadiness on feet (R26.81);Muscle weakness (generalized) (M62.81);Difficulty in walking, not elsewhere classified (R26.2);Pain Pain - Right/Left: Left Pain - part of body: Knee    Time: 0825-0915 PT Time Calculation (min) (ACUTE ONLY): 50 min   Charges:   PT Evaluation $PT Eval Low Complexity: 1 Low PT Treatments $Gait Training: 23-37 mins        Yasiel Goyne A. Gilford Rile PT, DPT Acute Rehabilitation Services Office [phone]   Chesley Noon  A Trebor Galdamez 03/27/2022, 9:24 AM

## 2022-03-27 NOTE — Discharge Instructions (Signed)

## 2022-03-27 NOTE — Care Plan (Signed)
Ortho Bundle Case Management Note  Patient Details  Name: Kelly Dixon MRN: [id number] Date of Birth: Mar 18, 1946                  [facility] RNCM call to patient prior to her upcoming Left total knee arthroplasty with Dr. Ninfa Linden on 03/26/22. She is an Ortho bundle patient through [facility] and is agreeable to case management. She lives alone, but has a niece that will be assisting after discharge. She has a 2 story home that has approximately 10 stairs to her bedroom. Will need to work with therapy on stairs prior to discharge. She will need a RW and has a riser for her toilet already. Anticipate HHPT will be needed after a short hospital stay. Referral made to [facility] after choice provided. Reviewed all post op care instructions. Will continue to follow for needs.   DME Arranged:  Gilford Rile rolling DME Agency:  [facility]  HH Arranged:  PT HH Agency:  [facility]  Additional Comments: Please contact me with any questions of if this plan should need to change.  Jamse Arn, RN, BSN, [employer]  [phone] 03/27/2022, 8:22 AM

## 2022-03-27 NOTE — Progress Notes (Signed)
Subjective: 1 Day Post-Op Procedure(s) (LRB): LEFT TOTAL KNEE ARTHROPLASTY (Left) Patient reports pain as moderate.    Objective: Vital signs in last 24 hours: Temp:  [97.5 F (36.4 C)-98.5 F (36.9 C)] 98.2 F (36.8 C) (06/28 0352) Pulse Rate:  [61-98] 79 (06/28 0352) Resp:  [11-26] 17 (06/28 0352) BP: (108-146)/(61-108) 109/70 (06/28 0352) SpO2:  [92 %-99 %] 94 % (06/28 0352) Weight:  [92.1 kg] 92.1 kg (06/27 0944)  Intake/Output from previous day: 06/27 0701 - 06/28 0700 In: 1100 [I.V.:1000; IV Piggyback:100] Out: 2350 [Urine:2200; Blood:150] Intake/Output this shift: No intake/output data recorded.  Recent Labs    03/27/22 0259  HGB 11.3*   Recent Labs    03/27/22 0259  WBC 10.5  RBC 3.66*  HCT 34.5*  PLT 186   Recent Labs    03/27/22 0259  NA 138  K 4.1  CL 106  CO2 24  BUN 12  CREATININE 0.74  GLUCOSE 160*  CALCIUM 8.9   No results for input(s): "LABPT", "INR" in the last 72 hours.  Sensation intact distally Intact pulses distally Dorsiflexion/Plantar flexion intact Incision: dressing C/D/I No cellulitis present Compartment soft   Assessment/Plan: 1 Day Post-Op Procedure(s) (LRB): LEFT TOTAL KNEE ARTHROPLASTY (Left) Up with therapy      Mcarthur Rossetti 03/27/2022, 7:50 AM

## 2022-03-28 DIAGNOSIS — I10 Essential (primary) hypertension: Secondary | ICD-10-CM | POA: Diagnosis not present

## 2022-03-28 DIAGNOSIS — M1712 Unilateral primary osteoarthritis, left knee: Secondary | ICD-10-CM | POA: Diagnosis not present

## 2022-03-28 DIAGNOSIS — Z87891 Personal history of nicotine dependence: Secondary | ICD-10-CM | POA: Diagnosis not present

## 2022-03-28 DIAGNOSIS — Z853 Personal history of malignant neoplasm of breast: Secondary | ICD-10-CM | POA: Diagnosis not present

## 2022-03-28 MED ORDER — OXYCODONE HCL 5 MG PO TABS
5.0000 mg | ORAL_TABLET | ORAL | 0 refills | Status: DC | PRN
Start: 2022-03-28 — End: 2022-05-07

## 2022-03-28 MED ORDER — METHOCARBAMOL 500 MG PO TABS: 500.0000 mg | ORAL_TABLET | Freq: Four times a day (QID) | ORAL | 1 refills | Status: DC | PRN

## 2022-03-28 MED ORDER — ASPIRIN 81 MG PO CHEW: 81.0000 mg | CHEWABLE_TABLET | Freq: Two times a day (BID) | ORAL | 1 refills | Status: DC

## 2022-03-28 NOTE — Progress Notes (Signed)
Subjective: 2 Days Post-Op Procedure(s) (LRB): LEFT TOTAL KNEE ARTHROPLASTY (Left) Patient reports pain as moderate.    Objective: Vital signs in last 24 hours: Temp:  [97.7 F (36.5 C)-98.4 F (36.9 C)] 98.4 F (36.9 C) (06/29 0818) Pulse Rate:  [81-108] 90 (06/29 0818) Resp:  [18-20] 20 (06/29 0818) BP: (101-117)/(54-80) 105/58 (06/29 0818) SpO2:  [95 %-97 %] 95 % (06/29 0818)  Intake/Output from previous day: No intake/output data recorded. Intake/Output this shift: Total I/O In: 240 [P.O.:240] Out: -   Recent Labs    03/27/22 0259  HGB 11.3*   Recent Labs    03/27/22 0259  WBC 10.5  RBC 3.66*  HCT 34.5*  PLT 186   Recent Labs    03/27/22 0259  NA 138  K 4.1  CL 106  CO2 24  BUN 12  CREATININE 0.74  GLUCOSE 160*  CALCIUM 8.9   No results for input(s): "LABPT", "INR" in the last 72 hours.  Sensation intact distally Intact pulses distally Dorsiflexion/Plantar flexion intact Incision: scant drainage Compartment soft   Assessment/Plan: 2 Days Post-Op Procedure(s) (LRB): LEFT TOTAL KNEE ARTHROPLASTY (Left) Up with therapy Discharge home with home health today.      Mcarthur Rossetti 03/28/2022, 10:45 AM

## 2022-03-28 NOTE — Discharge Summary (Signed)
Patient ID: Kelly Dixon MRN: [id number] DOB/AGE: Apr 30, 1946 76 y.o.  Admit date: 03/26/2022 Discharge date: 03/28/2022  Admission Diagnoses:  Principal Problem:   Unilateral primary osteoarthritis, left knee Active Problems:   Status post total left knee replacement   Discharge Diagnoses:  Same  Past Medical History:  Diagnosis Date   Anemia    Arthritis    Breast cancer ([place]) 2020   right breast   Gallstones 05/31/2018   GERD (gastroesophageal reflux disease)    Hypertension    Personal history of radiation therapy    PONV (postoperative nausea and vomiting)    Syphilis     Surgeries: Procedure(s): LEFT TOTAL KNEE ARTHROPLASTY on 03/26/2022   Consultants:   Discharged Condition: Improved  Hospital Course: Kelly Dixon is an 76 y.o. female who was admitted 03/26/2022 for operative treatment ofUnilateral primary osteoarthritis, left knee. Patient has severe unremitting pain that affects sleep, daily activities, and work/hobbies. After pre-op clearance the patient was taken to the operating room on 03/26/2022 and underwent  Procedure(s): LEFT TOTAL KNEE ARTHROPLASTY.    Patient was given perioperative antibiotics:  Anti-infectives (From admission, onward)    Start     Dose/Rate Route Frequency Ordered Stop   03/26/22 1015  ceFAZolin (ANCEF) IVPB 2g/100 mL premix        2 g 200 mL/hr over 30 Minutes Intravenous On call to O.R. 03/26/22 1011 03/26/22 1229        Patient was given sequential compression devices, early ambulation, and chemoprophylaxis to prevent DVT.  Patient benefited maximally from hospital stay and there were no complications.    Recent vital signs: Patient Vitals for the past 24 hrs:  BP Temp Temp src Pulse Resp SpO2  03/28/22 0818 (!) 105/58 98.4 F (36.9 C) Oral 90 20 95 %  03/28/22 0333 (!) 101/54 98.4 F (36.9 C) -- (!) 108 19 95 %  03/27/22 1944 115/80 97.7 F (36.5 C) Oral 97 18 97 %  03/27/22 1617 117/66 98.3 F (36.8 C) Oral 81  18 97 %     Recent laboratory studies:  Recent Labs    03/27/22 0259  WBC 10.5  HGB 11.3*  HCT 34.5*  PLT 186  NA 138  K 4.1  CL 106  CO2 24  BUN 12  CREATININE 0.74  GLUCOSE 160*  CALCIUM 8.9     Discharge Medications:   Allergies as of 03/28/2022   No Known Allergies      Medication List     TAKE these medications    acetaminophen 500 MG tablet Commonly known as: TYLENOL Take 1,000 mg by mouth every 6 (six) hours as needed for moderate pain.   amLODipine 5 MG tablet Commonly known as: NORVASC TAKE 1 TABLET(5 MG) BY MOUTH DAILY   aspirin 81 MG chewable tablet Chew 1 tablet (81 mg total) by mouth 2 (two) times daily.   fexofenadine 180 MG tablet Commonly known as: ALLEGRA Take 180 mg by mouth daily.   methocarbamol 500 MG tablet Commonly known as: ROBAXIN Take 1 tablet (500 mg total) by mouth every 6 (six) hours as needed for muscle spasms.   oxyCODONE 5 MG immediate release tablet Commonly known as: Oxy IR/ROXICODONE Take 1-2 tablets (5-10 mg total) by mouth every 4 (four) hours as needed for moderate pain (pain score 4-6).               Durable Medical Equipment  (From admission, onward)  Start     Ordered   03/26/22 1646  DME 3 n 1  Once        03/26/22 1645   03/26/22 1646  DME Walker rolling  Once       Question Answer Comment  Walker: With 5 Inch Wheels   Patient needs a walker to treat with the following condition Status post total left knee replacement      03/26/22 1645            Diagnostic Studies: DG Knee Left Port  Result Date: 03/26/2022 CLINICAL DATA:  Status post left knee arthroplasty. EXAM: PORTABLE LEFT KNEE - 1-2 VIEW COMPARISON:  Previous studies including the examination done earlier today FINDINGS: There is left knee arthroplasty. There is interval removal of a metallic foreign body seen in the subcutaneous plane in the medial aspect of left knee since the immediate previous examination done  earlier today. There are pockets of air in the soft tissues. Skin staples are noted. IMPRESSION: Status post left knee arthroplasty. There is interval removal of small metallic foreign body from the medial aspect of the left knee. Electronically Signed   By: Elmer Picker M.D.   On: 03/26/2022 16:01   DG Knee 1-2 Views Left  Result Date: 03/26/2022 CLINICAL DATA:  Left knee arthroplasty, evaluate for foreign body EXAM: LEFT KNEE - 1-2 VIEW COMPARISON:  Left knee radiographs done on 01/09/2022 FINDINGS: There is interval left knee arthroplasty. No fracture lines are seen in bony structures. There is 8 x 4 x 2 mm metallic density in the subcutaneous plane medial to the distal metaphyseal region of left femur. This finding was not evident in the previous left knee radiographs. In the previous study, severe degenerative changes were noted in the left knee. IMPRESSION: Status post left knee arthroplasty. There is new 8 x 4 x 2 mm metallic density in the subcutaneous plane medial to the distal left femur suggesting foreign body in the soft tissues. Another possibility would be foreign body on the skin. Imaging findings were relayed to the surgeon in [place] by telephone call. Electronically Signed   By: Elmer Picker M.D.   On: 03/26/2022 14:42    Disposition: Discharge disposition: 01-Home or Self Care          Follow-up Information     Mcarthur Rossetti, MD. Go on 04/09/2022.   Specialty: Orthopedic Surgery Why: at 9:15 am for your first post op appointment with Dr. Ninfa Linden in office Contact information: [place] [address] [phone]         [facility] [place] Follow up.   Specialty: Home Health Services Why: Someone from the home health agency will be in contact with you to schedule your first in home physical therapy appointment after discharge. Contact information: [place] [address] [phone]         [facility]. Go on 04/09/2022.   Specialty: Rehabilitation Why: at 10:15 am for your first Outpatient Physical Therapy appointment in our office immediately following your appointment with Dr. Ninfa Linden.  Go to elevators in building and exit on 2nd floor. Check in at desk upstairs for therapy. Contact information: [address] [place] [address] [phone]                 Signed: Mcarthur Rossetti 03/28/2022, 10:46 AM

## 2022-03-28 NOTE — Progress Notes (Signed)
Physical Therapy Treatment Patient Details Name: Kelly Dixon MRN: [id number] DOB: 1946/02/16 Today's Date: 03/28/2022   History of Present Illness 76 y/o female admitted on 03/26/22 following L TKA. PMH: HTN, hx of breast cancer    PT Comments    Patient making significant progress towards therapy goals. Patient ambulated 150' x 2 with RW and supervision with cues for heel strike and RW management. Able to negotiate stairs sideways with L rail to simulate home navigation. Encouraged patient to continue performing exercises focusing on quad strengthening and ROM, patient verbalized understanding and has been performing in room. Patient does not require second session this PM prior to discharging home. D/c plan remains appropriate and patient is mobilizing well to return home.     Recommendations for follow up therapy are one component of a multi-disciplinary discharge planning process, led by the attending physician.  Recommendations may be updated based on patient status, additional functional criteria and insurance authorization.  Follow Up Recommendations  Follow physician's recommendations for discharge plan and follow up therapies     Assistance Recommended at Discharge Frequent or constant Supervision/Assistance  Patient can return home with the following A little help with walking and/or transfers;A little help with bathing/dressing/bathroom;Assistance with cooking/housework;Assist for transportation;Help with stairs or ramp for entrance   Equipment Recommendations  Rolling Massey Ruhland (2 wheels)    Recommendations for Other Services       Precautions / Restrictions Precautions Precautions: Fall;Knee Required Braces or Orthoses: Knee Immobilizer - Left Knee Immobilizer - Left: On when out of bed or walking Restrictions Weight Bearing Restrictions: Yes LLE Weight Bearing: Weight bearing as tolerated     Mobility  Bed Mobility Overal bed mobility: Modified Independent                   Transfers Overall transfer level: Needs assistance Equipment used: Rolling Willie Loy (2 wheels) Transfers: Sit to/from Stand Sit to Stand: Supervision                Ambulation/Gait Ambulation/Gait assistance: Supervision Gait Distance (Feet): 150 Feet (+150') Assistive device: Rolling Shauntia Levengood (2 wheels) Gait Pattern/deviations: Decreased stride length, Step-to pattern, Decreased stance time - left, Decreased weight shift to left, Antalgic Gait velocity: decreased     General Gait Details: supervision for safety. Cues for close RW proximity and heel strike on L   Stairs Stairs: Yes Stairs assistance: Supervision Stair Management: One rail Left, Step to pattern, Sideways Number of Stairs: 6 General stair comments: able to ascend stairs sideways with use of L rail with supervision. Patient will be able to ascend flight of stairs in home. Discussed with patient about planning for the day to limit number of times she has to ascend/descend stairs   Wheelchair Mobility    Modified Rankin (Stroke Patients Only)       Balance Overall balance assessment: Needs assistance Sitting-balance support: No upper extremity supported, Feet supported Sitting balance-Leahy Scale: Good     Standing balance support: Bilateral upper extremity supported, Reliant on assistive device for balance Standing balance-Leahy Scale: Poor Standing balance comment: reliant on RW                            Cognition Arousal/Alertness: Awake/alert Behavior During Therapy: WFL for tasks assessed/performed Overall Cognitive Status: Within Functional Limits for tasks assessed  Exercises      General Comments        Pertinent Vitals/Pain Pain Assessment Pain Assessment: Faces Faces Pain Scale: Hurts a little bit Pain Location: L knee Pain Descriptors / Indicators: Sharp Pain Intervention(s): Monitored during  session    Home Living                          Prior Function            PT Goals (current goals can now be found in the care plan section) Acute Rehab PT Goals Patient Stated Goal: to go home PT Goal Formulation: With patient Time For Goal Achievement: 04/10/22 Potential to Achieve Goals: Good Progress towards PT goals: Progressing toward goals    Frequency    7X/week      PT Plan Current plan remains appropriate    Co-evaluation              AM-PAC PT "6 Clicks" Mobility   Outcome Measure  Help needed turning from your back to your side while in a flat bed without using bedrails?: None Help needed moving from lying on your back to sitting on the side of a flat bed without using bedrails?: None Help needed moving to and from a bed to a chair (including a wheelchair)?: A Little Help needed standing up from a chair using your arms (e.g., wheelchair or bedside chair)?: A Little Help needed to walk in hospital room?: A Little Help needed climbing 3-5 steps with a railing? : A Little 6 Click Score: 20    End of Session Equipment Utilized During Treatment: Gait belt Activity Tolerance: Patient tolerated treatment well Patient left: in chair;with call bell/phone within reach Nurse Communication: Mobility status PT Visit Diagnosis: Unsteadiness on feet (R26.81);Muscle weakness (generalized) (M62.81);Difficulty in walking, not elsewhere classified (R26.2);Pain Pain - Right/Left: Left Pain - part of body: Knee     Time: [id number] PT Time Calculation (min) (ACUTE ONLY): 25 min  Charges:  $Gait Training: 8-22 mins $Therapeutic Activity: 8-22 mins                     Maryalice Pasley A. Gilford Rile PT, DPT Acute Rehabilitation Services Office [phone]    Linna Hoff 03/28/2022, 8:53 AM

## 2022-04-04 ENCOUNTER — Encounter: Payer: Self-pay | Admitting: Family Medicine

## 2022-04-04 DIAGNOSIS — I10 Essential (primary) hypertension: Secondary | ICD-10-CM

## 2022-04-05 MED ORDER — AMLODIPINE BESYLATE 5 MG PO TABS: ORAL_TABLET | ORAL | 3 refills | Status: DC

## 2022-04-09 ENCOUNTER — Encounter: Payer: Self-pay | Admitting: Physical Therapy

## 2022-04-09 ENCOUNTER — Ambulatory Visit: Payer: Medicare HMO | Admitting: Physical Therapy

## 2022-04-09 ENCOUNTER — Ambulatory Visit (INDEPENDENT_AMBULATORY_CARE_PROVIDER_SITE_OTHER): Payer: Medicare HMO | Admitting: Orthopaedic Surgery

## 2022-04-09 ENCOUNTER — Encounter: Payer: Self-pay | Admitting: Orthopaedic Surgery

## 2022-04-09 ENCOUNTER — Telehealth: Payer: Self-pay | Admitting: *Deleted

## 2022-04-09 DIAGNOSIS — M25562 Pain in left knee: Secondary | ICD-10-CM

## 2022-04-09 DIAGNOSIS — M25662 Stiffness of left knee, not elsewhere classified: Secondary | ICD-10-CM | POA: Diagnosis not present

## 2022-04-09 DIAGNOSIS — M6281 Muscle weakness (generalized): Secondary | ICD-10-CM

## 2022-04-09 DIAGNOSIS — R262 Difficulty in walking, not elsewhere classified: Secondary | ICD-10-CM

## 2022-04-09 DIAGNOSIS — R600 Localized edema: Secondary | ICD-10-CM | POA: Diagnosis not present

## 2022-04-09 DIAGNOSIS — Z96652 Presence of left artificial knee joint: Secondary | ICD-10-CM

## 2022-04-09 NOTE — Telephone Encounter (Signed)
Ortho bundle 14 day in office meeting today completed.

## 2022-04-09 NOTE — Progress Notes (Signed)
The patient is a 76 year old female that is at her first postoperative visit 2 weeks status post a left total knee arthroplasty.  She states she is doing great.  She reports good range of motion and strength.  She starts outpatient physical therapy today.  She is not taking any pain medications at all.  She has been compliant with taking a baby aspirin twice daily.  She denies any calf pain.  She is very motivated.  Her home health physical therapy notes show that she has been able to flex passively to 114 degrees.  On my exam today her staples have been removed and Steri-Strips applied.  Her calf is soft.  There is bruising to be expected.  Her knee feels ligamentously stable.  She will transition outpatient physical therapy.  We will see her back in 4 weeks to see how she is doing overall.  She wants to consider a right total knee arthroplasty may be next summer.  I told her that we would x-ray her and left operative knee in about 3 to 6 months from now but we would still see her back on some regular visits to make sure things are doing well.

## 2022-04-09 NOTE — Therapy (Signed)
OUTPATIENT PHYSICAL THERAPY LOWER EXTREMITY EVALUATION   Patient Name: Kelly Dixon MRN: [id number] DOB:10-04-1945, 76 y.o., female Today's Date: 04/09/2022   PT End of Session - 04/09/22 1020     Visit Number 1    Number of Visits 24    Date for PT Re-Evaluation 06/07/22    Authorization Type [employer]    Progress Note Due on Visit 10    PT Start Time 1015    PT Stop Time 1100    PT Time Calculation (min) 45 min    Activity Tolerance Patient tolerated treatment well    Behavior During Therapy [facility] for tasks assessed/performed             Past Medical History:  Diagnosis Date   Anemia    Arthritis    Breast cancer ([place]) 2020   right breast   Gallstones 05/31/2018   GERD (gastroesophageal reflux disease)    Hypertension    Personal history of radiation therapy    PONV (postoperative nausea and vomiting)    Syphilis    Past Surgical History:  Procedure Laterality Date   BREAST LUMPECTOMY WITH RADIOACTIVE SEED AND SENTINEL LYMPH NODE BIOPSY Right 09/15/2019   Procedure: RIGHT BREAST LUMPECTOMY WITH RADIOACTIVE SEED AND SENTINEL LYMPH NODE BIOPSY;  Surgeon: Donnie Mesa, MD;  Location: [facility];  Service: General;  Laterality: Right;   CHOLECYSTECTOMY N/A 04/13/2019   Procedure: LAPAROSCOPIC CHOLECYSTECTOMY WITH INTRAOPERATIVE CHOLANGIOGRAM;  Surgeon: Donnie Mesa, MD;  Location: [facility];  Service: General;  Laterality: N/A;   DILATION AND CURETTAGE OF UTERUS     TOTAL KNEE ARTHROPLASTY Left 03/26/2022   Procedure: LEFT TOTAL KNEE ARTHROPLASTY;  Surgeon: Mcarthur Rossetti, MD;  Location: [facility];  Service: Orthopedics;  Laterality: Left;   Patient Active Problem List   Diagnosis Date Noted   Status post total left knee replacement 03/26/2022   Unilateral primary osteoarthritis, left knee 01/22/2022   Unilateral primary osteoarthritis, right knee 01/22/2022   Bilateral primary osteoarthritis of knee 01/09/2022   Chronic pain of both knees  10/09/2020   Essential hypertension 09/04/2020   Malignant neoplasm of upper-outer quadrant of right breast in female, estrogen receptor positive ([place]) 09/08/2019    PCP: Shelda Pal DO  REFERRING PROVIDER: Mcarthur Rossetti, MD  REFERRING DIAG: [phone] (ICD-10-CM) - Unilateral primary osteoarthritis, left knee  THERAPY DIAG:  Acute pain of left knee  Stiffness of left knee, not elsewhere classified  Difficulty in walking, not elsewhere classified  Muscle weakness (generalized)  Localized edema  Rationale for Evaluation and Treatment Rehabilitation  ONSET DATE: 03/26/2022 Left TKA  SUBJECTIVE:   SUBJECTIVE STATEMENT: Pt s/p left TKA on 03/26/22. Pt stating she received HHPT. Pt reporting more stiffness than pain in her her left knee. 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   PERTINENT HISTORY: HTN, hx of breast cancer  PAIN:  Are you having pain? Yes: NPRS scale: 2-3/10 Pain location: lfet knee Pain description: achy Aggravating factors: standing Relieving factors: resting, ice  PRECAUTIONS: Fall  WEIGHT BEARING RESTRICTIONS No  FALLS:  Has patient fallen in last 6 months? No  LIVING ENVIRONMENT: Lives with: lives with their family and lives with their spouse Lives in: House/apartment Stairs: No Has following equipment at home: Single point cane  OCCUPATION: substitute teacher  PLOF: Independent  PATIENT GOALS Get to  teaching high school   OBJECTIVE:    PATIENT SURVEYS:  04/09/22: FOTO 61% (predicted 75%)  COGNITION:  04/09/22: Overall cognitive status: Within functional limits for tasks assessed     SENSATION: 04/09/22: WFL  EDEMA:  04/09/22: Circumferential: left knee mid patella: 46 centimeters                                          Rt knee mid patella : 44 centimeters  MUSCLE LENGTH: 04/09/22:  Hamstrings: Right 58 deg; Left 65 deg          POSTURE:  04/09/22: rounded shoulders, forward head, increased lumbar lordosis, and increased thoracic kyphosis  PALPATION: 04/09/2022: mild tenderness around joint line and incision, bruising noted in lower leg (purple, redness, and yellow coloration)  LOWER EXTREMITY ROM:   ROM Right 04/09/22 Left 04/09/22  Knee flexion A: 115 P: 118 A: 108 P: 110  Knee extension A: -20 P: -18 A: -16 P: -12   (Blank rows = not tested)  LOWER EXTREMITY MMT:  MMT Right eval Left eval  Hip flexion 5/5 5/5  Hip extension    Hip abduction    Hip adduction    Hip internal rotation    Hip external rotation    Knee flexion 4/5 4-/5  Knee extension 5/5 4-/5  Ankle dorsiflexion    Ankle plantarflexion    Ankle inversion    Ankle eversion     (Blank rows = not tested)    FUNCTIONAL TESTS:  5 times sit to stand: 12 seconds UE support  GAIT: Distance walked: 30 feet  Assistive device utilized: Single point cane Level of assistance: Modified independence Comments: mild antalgic gait pattern    TODAY'S TREATMENT: 04/09/22 HEP instruction/performance c cues for techniques, handout provided.  Trial set performed of each for comprehension and symptom assessment.  See below for exercise list.   PATIENT EDUCATION:  Education details: PT POC, HEP Person educated: Patient Education method: Explanation, Demonstration, Tactile cues, Verbal cues, and Handouts Education comprehension: verbalized understanding and returned demonstration   HOME EXERCISE PROGRAM: Access Code: [email or web address] URL: [email or web address] Date: 04/09/2022 Prepared by: Kearney Hard  Exercises - Supine Bridge  - 3 x daily - 7 x weekly - 2 sets - 10 reps - Hooklying Hamstring Stretch with Strap  - 3 x daily - 7 x weekly - 3 reps - Long Sitting Quad Set with Towel Roll Under Heel  - 3 x daily - 7 x weekly - 2 sets - 10 reps - 5 seocnds hold - Active Straight  Leg Raise with Quad Set  - 3 x daily - 7 x weekly - 2 sets - 10 reps - Supine Heel Slide with Strap  - 3 x daily - 7 x weekly - 10 reps - 5-10 seconds hold - Sit to Stand  - 2 x daily - 7 x weekly - 2 sets - 10 reps  ASSESSMENT:  CLINICAL IMPRESSION: Patient is a 76 y.o. who comes to clinic with complaints of left knee pain s/p left TKA on 03/26/2022 with mobility, strength and movement coordination deficits that impair their ability to perform usual daily and recreational functional activities without increase difficulty/symptoms.  Patient to benefit from skilled PT services to address impairments and limitations to improve to previous level of function without restriction secondary to condition.    OBJECTIVE IMPAIRMENTS decreased balance, decreased endurance, decreased mobility,  difficulty walking, decreased ROM, decreased strength, increased edema, impaired flexibility, and pain.   ACTIVITY LIMITATIONS lifting, bending, sitting, standing, squatting, sleeping, stairs, and transfers  PARTICIPATION LIMITATIONS: cleaning, laundry, driving, and community activity  PERSONAL FACTORS : HTN, hx of breast cancer are also affecting patient's functional outcome.   REHAB POTENTIAL: Good  CLINICAL DECISION MAKING: Stable/uncomplicated  EVALUATION COMPLEXITY: Low   GOALS: Goals reviewed with patient? Yes  SHORT TERM GOALS: Target date: 05/07/2022   Patient will demonstrate independent use of initial home exercise program to maintain progress from in clinic treatments. Goal status: New   Long term PT goals (target dates for all long term goals are 8 weeks  06/07/2022) Patient will demonstrate/report pain at worst less than or equal to 2/10 to facilitate minimal limitation in daily activity secondary to pain symptoms. Goal status: New   Patient will demonstrate independent use of home exercise program to facilitate ability to maintain/progress functional gains from skilled physical therapy  services. Goal status: New   Patient will demonstrate FOTO outcome > or = 75 % to indicate reduced disability due to condition. Goal status: New   Pt will improve her Left knee active arc of motion from 5-120 degrees. Goal status: New       5.  Pt will be able to navigate stairs with single rail with step over step gait pattern.   Goal status: New   PLAN: PT FREQUENCY: 2-3 x/ week  PT DURATION: 8 weeks to 06/07/2022  PLANNED INTERVENTIONS: Therapeutic exercises, Therapeutic activity, Neuromuscular re-education, Balance training, Gait training, Patient/Family education, Joint mobilization, Stair training, Dry Needling, Electrical stimulation, Cryotherapy, Moist heat, Taping, Ultrasound, and Manual therapy  PLAN FOR NEXT SESSION: Knee ROM, strengthening, functional mobility.    Oretha Caprice, PT, MPT 04/09/2022, 12:20 PM  Referring diagnosis? M17.12 (ICD-10-CM) - Unilateral primary osteoarthritis Treatment diagnosis? (if different than referring diagnosis) M25.562, M25.662, R26.2, M62.81, R60.0 What was this (referring dx) caused by? '[x]'$  Surgery '[]'$  Fall '[]'$  Ongoing issue '[]'$  Arthritis '[]'$  Other: ____________  Laterality: '[]'$  Rt '[x]'$  Lt '[]'$  Both  Check all possible CPT codes:  *CHOOSE 10 OR LESS*    '[x]'$  97110 (Therapeutic Exercise)  '[]'$  92507 (SLP Treatment)  '[x]'$  97112 (Neuro Re-ed)   '[]'$  92526 (Swallowing Treatment)   '[x]'$  97116 (Gait Training)   '[]'$  [id number] (Cognitive Training, 1st 15 minutes) '[x]'$  97140 (Manual Therapy)   '[]'$  97130 (Cognitive Training, each add'l 15 minutes)  '[]'$  97164 (Re-evaluation)                              '[]'$  Other, List CPT Code ____________  '[x]'$  97530 (Therapeutic Activities)     '[]'$  80165 (Self Care)   '[]'$  All codes above (97110 - 97535)  '[]'$  97012 (Mechanical Traction)  '[x]'$  97014 (E-stim Unattended)  '[]'$  97032 (E-stim manual)  '[]'$  97033 (Ionto)  '[]'$  97035 (Ultrasound) '[]'$  97750 (Physical Performance Training) '[]'$  [id number] (Aquatic Therapy) '[x]'$  97016  (Vasopneumatic Device) '[]'$  [id number] (Paraffin) '[]'$  97034 (Contrast Bath) '[]'$  97597 (Wound Care 1st 20 sq cm) '[]'$  97598 (Wound Care each add'l 20 sq cm) '[]'$  97760 (Orthotic Fabrication, Fitting, Training Initial) '[]'$  [id number] (Prosthetic Management and Training Initial) '[]'$  [id number] (Orthotic or Prosthetic Training/ Modification Subsequent)

## 2022-04-10 ENCOUNTER — Encounter: Payer: Medicare HMO | Admitting: Physical Therapy

## 2022-04-10 NOTE — Therapy (Deleted)
OUTPATIENT PHYSICAL THERAPY TREATMENT NOTE   Patient Name: Kelly Dixon MRN: [id number] DOB:1946-06-01, 76 y.o., female Today's Date: 04/10/2022  PCP: Shelda Pal, DO REFERRING PROVIDER: Mcarthur Rossetti, MD  END OF SESSION:    Past Medical History:  Diagnosis Date   Anemia    Arthritis    Breast cancer ([place]) 2020   right breast   Gallstones 05/31/2018   GERD (gastroesophageal reflux disease)    Hypertension    Personal history of radiation therapy    PONV (postoperative nausea and vomiting)    Syphilis    Past Surgical History:  Procedure Laterality Date   BREAST LUMPECTOMY WITH RADIOACTIVE SEED AND SENTINEL LYMPH NODE BIOPSY Right 09/15/2019   Procedure: RIGHT BREAST LUMPECTOMY WITH RADIOACTIVE SEED AND SENTINEL LYMPH NODE BIOPSY;  Surgeon: Donnie Mesa, MD;  Location: [place];  Service: General;  Laterality: Right;   CHOLECYSTECTOMY N/A 04/13/2019   Procedure: LAPAROSCOPIC CHOLECYSTECTOMY WITH INTRAOPERATIVE CHOLANGIOGRAM;  Surgeon: Donnie Mesa, MD;  Location: [place];  Service: General;  Laterality: N/A;   DILATION AND CURETTAGE OF UTERUS     TOTAL KNEE ARTHROPLASTY Left 03/26/2022   Procedure: LEFT TOTAL KNEE ARTHROPLASTY;  Surgeon: Mcarthur Rossetti, MD;  Location: [place];  Service: Orthopedics;  Laterality: Left;   Patient Active Problem List   Diagnosis Date Noted   Status post total left knee replacement 03/26/2022   Unilateral primary osteoarthritis, left knee 01/22/2022   Unilateral primary osteoarthritis, right knee 01/22/2022   Bilateral primary osteoarthritis of knee 01/09/2022   Chronic pain of both knees 10/09/2020   Essential hypertension 09/04/2020   Malignant neoplasm of upper-outer quadrant of right breast in female, estrogen receptor positive ([place]) 09/08/2019    REFERRING DIAG:  M17.12 (ICD-10-CM) - Unilateral primary osteoarthritis, left knee  THERAPY DIAG:  No diagnosis found.  Rationale for  Evaluation and Treatment Rehabilitation  PERTINENT HISTORY: HTN, hx of breast cancer  PRECAUTIONS: fall  SUBJECTIVE: ***  PAIN:  Are you having pain? {[id number]}   OBJECTIVE: (objective measures completed at initial evaluation unless otherwise dated) PATIENT SURVEYS:  04/09/22: FOTO 61% (predicted 75%)   COGNITION:           04/09/22: Overall cognitive status: Within functional limits for tasks assessed                             SENSATION: 04/09/22: WFL   EDEMA:  04/09/22: Circumferential: left knee mid patella: 46 centimeters                                          Rt knee mid patella : 44 centimeters   MUSCLE LENGTH: 04/09/22: Hamstrings: Right 58 deg; Left 65 deg           POSTURE:  04/09/22: rounded shoulders, forward head, increased lumbar lordosis, and increased thoracic kyphosis   PALPATION: 04/09/2022: mild tenderness around joint line and incision, bruising noted in lower leg (purple, redness, and yellow coloration)   LOWER EXTREMITY ROM:    ROM Right 04/09/22 Left 04/09/22  Knee flexion A: 115 P: 118 A: 108 P: 110  Knee extension A: -20 P: -18 A: -16 P: -12   (Blank rows = not tested)   LOWER EXTREMITY MMT:   MMT Right eval Left eval  Hip flexion 5/5 5/5  Hip extension  Hip abduction      Hip adduction      Hip internal rotation      Hip external rotation      Knee flexion 4/5 4-/5  Knee extension 5/5 4-/5  Ankle dorsiflexion      Ankle plantarflexion      Ankle inversion      Ankle eversion       (Blank rows = not tested)       FUNCTIONAL TESTS:  5 times sit to stand: 12 seconds UE support   GAIT: Distance walked: 30 feet  Assistive device utilized: Single point cane Level of assistance: Modified independence Comments: mild antalgic gait pattern       TODAY'S TREATMENT: 04/09/22 HEP instruction/performance c cues for techniques, handout provided.  Trial set performed of each for comprehension and symptom assessment.  See  below for exercise list.     PATIENT EDUCATION:  Education details: PT POC, HEP Person educated: Patient Education method: Explanation, Demonstration, Tactile cues, Verbal cues, and Handouts Education comprehension: verbalized understanding and returned demonstration     HOME EXERCISE PROGRAM: Access Code: [email or web address] URL: [email or web address] Date: 04/09/2022 Prepared by: Kearney Hard   Exercises - Supine Bridge  - 3 x daily - 7 x weekly - 2 sets - 10 reps - Hooklying Hamstring Stretch with Strap  - 3 x daily - 7 x weekly - 3 reps - Long Sitting Quad Set with Towel Roll Under Heel  - 3 x daily - 7 x weekly - 2 sets - 10 reps - 5 seocnds hold - Active Straight Leg Raise with Quad Set  - 3 x daily - 7 x weekly - 2 sets - 10 reps - Supine Heel Slide with Strap  - 3 x daily - 7 x weekly - 10 reps - 5-10 seconds hold - Sit to Stand  - 2 x daily - 7 x weekly - 2 sets - 10 reps   ASSESSMENT:   CLINICAL IMPRESSION: Patient is a 76 y.o. who comes to clinic with complaints of left knee pain s/p left TKA on 03/26/2022 with mobility, strength and movement coordination deficits that impair their ability to perform usual daily and recreational functional activities without increase difficulty/symptoms.  Patient to benefit from skilled PT services to address impairments and limitations to improve to previous level of function without restriction secondary to condition.      OBJECTIVE IMPAIRMENTS decreased balance, decreased endurance, decreased mobility, difficulty walking, decreased ROM, decreased strength, increased edema, impaired flexibility, and pain.    ACTIVITY LIMITATIONS lifting, bending, sitting, standing, squatting, sleeping, stairs, and transfers   PARTICIPATION LIMITATIONS: cleaning, laundry, driving, and community activity   PERSONAL FACTORS : HTN, hx of breast cancer are also affecting patient's functional outcome.    REHAB POTENTIAL: Good   CLINICAL DECISION  MAKING: Stable/uncomplicated   EVALUATION COMPLEXITY: Low     GOALS: Goals reviewed with patient? Yes   SHORT TERM GOALS: Target date: 05/07/2022    Patient will demonstrate independent use of initial home exercise program to maintain progress from in clinic treatments. Goal status: New   Long term PT goals (target dates for all long term goals are 8 weeks  06/07/2022) Patient will demonstrate/report pain at worst less than or equal to 2/10 to facilitate minimal limitation in daily activity secondary to pain symptoms. Goal status: New   Patient will demonstrate independent use of home exercise program to facilitate ability to maintain/progress functional gains from skilled physical therapy services. Goal status:  New   Patient will demonstrate FOTO outcome > or = 75 % to indicate reduced disability due to condition. Goal status: New   Pt will improve her Left knee active arc of motion from 5-120 degrees. Goal status: New       5.  Pt will be able to navigate stairs with single rail with step over step gait pattern.   Goal status: New     PLAN: PT FREQUENCY: 2-3 x/ week   PT DURATION: 8 weeks to 06/07/2022   PLANNED INTERVENTIONS: Therapeutic exercises, Therapeutic activity, Neuromuscular re-education, Balance training, Gait training, Patient/Family education, Joint mobilization, Stair training, Dry Needling, Electrical stimulation, Cryotherapy, Moist heat, Taping, Ultrasound, and Manual therapy   PLAN FOR NEXT SESSION: Knee ROM, strengthening, functional mobility.           Oretha Caprice, PT, MPT 04/10/2022, 8:13 AM

## 2022-04-11 ENCOUNTER — Encounter: Payer: Self-pay | Admitting: Physical Therapy

## 2022-04-11 ENCOUNTER — Ambulatory Visit: Payer: Medicare HMO | Admitting: Physical Therapy

## 2022-04-11 DIAGNOSIS — M25662 Stiffness of left knee, not elsewhere classified: Secondary | ICD-10-CM

## 2022-04-11 DIAGNOSIS — R262 Difficulty in walking, not elsewhere classified: Secondary | ICD-10-CM

## 2022-04-11 DIAGNOSIS — R600 Localized edema: Secondary | ICD-10-CM

## 2022-04-11 DIAGNOSIS — M25562 Pain in left knee: Secondary | ICD-10-CM

## 2022-04-11 DIAGNOSIS — M6281 Muscle weakness (generalized): Secondary | ICD-10-CM

## 2022-04-11 NOTE — Therapy (Signed)
OUTPATIENT PHYSICAL THERAPY TREATMENT NOTE   Patient Name: Kelly Dixon MRN: [id number] DOB:06-10-1946, 76 y.o., female Today's Date: 04/11/2022   END OF SESSION:   PT End of Session - 04/11/22 1202     Visit Number 2    Number of Visits 24    Date for PT Re-Evaluation 06/07/22    Authorization Type [employer]    Progress Note Due on Visit 10    PT Start Time 1145    PT Stop Time 1230    PT Time Calculation (min) 45 min    Activity Tolerance Patient tolerated treatment well    Behavior During Therapy [facility] for tasks assessed/performed             Past Medical History:  Diagnosis Date   Anemia    Arthritis    Breast cancer ([place]) 2020   right breast   Gallstones 05/31/2018   GERD (gastroesophageal reflux disease)    Hypertension    Personal history of radiation therapy    PONV (postoperative nausea and vomiting)    Syphilis    Past Surgical History:  Procedure Laterality Date   BREAST LUMPECTOMY WITH RADIOACTIVE SEED AND SENTINEL LYMPH NODE BIOPSY Right 09/15/2019   Procedure: RIGHT BREAST LUMPECTOMY WITH RADIOACTIVE SEED AND SENTINEL LYMPH NODE BIOPSY;  Surgeon: Donnie Mesa, MD;  Location: [place];  Service: General;  Laterality: Right;   CHOLECYSTECTOMY N/A 04/13/2019   Procedure: LAPAROSCOPIC CHOLECYSTECTOMY WITH INTRAOPERATIVE CHOLANGIOGRAM;  Surgeon: Donnie Mesa, MD;  Location: [place];  Service: General;  Laterality: N/A;   DILATION AND CURETTAGE OF UTERUS     TOTAL KNEE ARTHROPLASTY Left 03/26/2022   Procedure: LEFT TOTAL KNEE ARTHROPLASTY;  Surgeon: Mcarthur Rossetti, MD;  Location: [place];  Service: Orthopedics;  Laterality: Left;   Patient Active Problem List   Diagnosis Date Noted   Status post total left knee replacement 03/26/2022   Unilateral primary osteoarthritis, left knee 01/22/2022   Unilateral primary osteoarthritis, right knee 01/22/2022   Bilateral primary osteoarthritis of knee 01/09/2022   Chronic pain of both knees  10/09/2020   Essential hypertension 09/04/2020   Malignant neoplasm of upper-outer quadrant of right breast in female, estrogen receptor positive ([place]) 09/08/2019     THERAPY DIAG:  Acute pain of left knee  Stiffness of left knee, not elsewhere classified  Difficulty in walking, not elsewhere classified  Muscle weakness (generalized)  Localized edema   PCP: Shelda Pal DO   REFERRING PROVIDER: Mcarthur Rossetti, MD   REFERRING DIAG: [phone] (ICD-10-CM) - Unilateral primary osteoarthritis, left knee     Rationale for Evaluation and Treatment Rehabilitation   ONSET DATE: 03/26/2022 Left TKA   SUBJECTIVE:    SUBJECTIVE STATEMENT: Pt says her knee is ok, its her calf and rest of her body that hurts today   PERTINENT HISTORY: HTN, hx of breast cancer   PAIN:  Are you having pain? Yes: NPRS scale: 4/10 Pain location: lfet knee, calf Pain description: achy Aggravating factors: standing Relieving factors: resting, ice   PRECAUTIONS: Fall   WEIGHT BEARING RESTRICTIONS No   FALLS:  Has patient fallen in last 6 months? No   LIVING ENVIRONMENT: Lives with: lives with their family and lives with their spouse Lives in: House/apartment Stairs: No Has following equipment at home: Single point cane   OCCUPATION: substitute teacher   PLOF: Independent   PATIENT GOALS Get to teaching high school     OBJECTIVE:      PATIENT SURVEYS:  04/09/22: FOTO 61% (predicted 75%)   COGNITION:           04/09/22: Overall cognitive status: Within functional limits for tasks assessed                             SENSATION: 04/09/22: WFL   EDEMA:  04/09/22: Circumferential: left knee mid patella: 46 centimeters                                          Rt knee mid patella : 44 centimeters   MUSCLE LENGTH: 04/09/22: Hamstrings: Right 58 deg; Left 65 deg           POSTURE:  04/09/22: rounded shoulders, forward head, increased lumbar lordosis, and increased  thoracic kyphosis   PALPATION: 04/09/2022: mild tenderness around joint line and incision, bruising noted in lower leg (purple, redness, and yellow coloration)   LOWER EXTREMITY ROM:    ROM Right 04/09/22 Left 04/09/22  Knee flexion A: 115 P: 118 A: 108 P: 110  Knee extension A: -20 P: -18 A: -16 P: -12   (Blank rows = not tested)   LOWER EXTREMITY MMT:   MMT Right eval Left eval  Hip flexion 5/5 5/5  Hip extension      Hip abduction      Hip adduction      Hip internal rotation      Hip external rotation      Knee flexion 4/5 4-/5  Knee extension 5/5 4-/5  Ankle dorsiflexion      Ankle plantarflexion      Ankle inversion      Ankle eversion       (Blank rows = not tested)       FUNCTIONAL TESTS:  5 times sit to stand: 12 seconds UE support   GAIT: Distance walked: 30 feet  Assistive device utilized: Single point cane Level of assistance: Modified independence Comments: mild antalgic gait pattern       TODAY'S TREATMENT: 04/11/22 -Nu step L5 X 8 min seat 7 UE/LE  -Stantboard stretch 30 sec X 3  -Heel and toe raises X 20 -Leg press DL 50# 2X15, then SL 25# X 15 each leg -Seated H.S. stretch 2 X 30 sec bilat -Seated AAROM knee flexion stretch 10 sec X 10 -Seated LAQ with 3 sec hold X15   Manual therapy for left knee PROM to her tolerance   Vasopnuematic device X 10 min to left knee, low compression, X 10 min, lowest temp.  04/09/22 HEP instruction/performance c cues for techniques, handout provided.  Trial set performed of each for comprehension and symptom assessment.  See below for exercise list.     PATIENT EDUCATION:  Education details: PT POC, HEP Person educated: Patient Education method: Explanation, Demonstration, Tactile cues, Verbal cues, and Handouts Education comprehension: verbalized understanding and returned demonstration     HOME EXERCISE PROGRAM: Access Code: [email or web address] URL: [email or web address] Date:  04/09/2022 Prepared by: Kearney Hard   Exercises - Supine Bridge  - 3 x daily - 7 x weekly - 2 sets - 10 reps - Hooklying Hamstring Stretch with Strap  - 3 x daily - 7 x weekly - 3 reps - Long Sitting Quad Set with Towel Roll Under Heel  - 3 x daily - 7 x weekly - 2 sets - 10 reps - 5  seocnds hold - Active Straight Leg Raise with Quad Set  - 3 x daily - 7 x weekly - 2 sets - 10 reps - Supine Heel Slide with Strap  - 3 x daily - 7 x weekly - 10 reps - 5-10 seconds hold - Sit to Stand  - 2 x daily - 7 x weekly - 2 sets - 10 reps   ASSESSMENT:   CLINICAL IMPRESSION: Patient is doing quite well with her ROM up to this point. She had good overall tolerance to strength and ROM program today and we will monitor for soreness from this next time. I did provide vasopnuematic intervention at end of session to reduce the edema noted in left knee.      OBJECTIVE IMPAIRMENTS decreased balance, decreased endurance, decreased mobility, difficulty walking, decreased ROM, decreased strength, increased edema, impaired flexibility, and pain.    ACTIVITY LIMITATIONS lifting, bending, sitting, standing, squatting, sleeping, stairs, and transfers   PARTICIPATION LIMITATIONS: cleaning, laundry, driving, and community activity   PERSONAL FACTORS : HTN, hx of breast cancer are also affecting patient's functional outcome.    REHAB POTENTIAL: Good   CLINICAL DECISION MAKING: Stable/uncomplicated   EVALUATION COMPLEXITY: Low     GOALS: Goals reviewed with patient? Yes   SHORT TERM GOALS: Target date: 05/07/2022    Patient will demonstrate independent use of initial home exercise program to maintain progress from in clinic treatments. Goal status: New   Long term PT goals (target dates for all long term goals are 8 weeks  06/07/2022) Patient will demonstrate/report pain at worst less than or equal to 2/10 to facilitate minimal limitation in daily activity secondary to pain symptoms. Goal status: New    Patient will demonstrate independent use of home exercise program to facilitate ability to maintain/progress functional gains from skilled physical therapy services. Goal status: New   Patient will demonstrate FOTO outcome > or = 75 % to indicate reduced disability due to condition. Goal status: New   Pt will improve her Left knee active arc of motion from 5-120 degrees. Goal status: New       5.  Pt will be able to navigate stairs with single rail with step over step gait pattern.   Goal status: New     PLAN: PT FREQUENCY: 2-3 x/ week   PT DURATION: 8 weeks to 06/07/2022   PLANNED INTERVENTIONS: Therapeutic exercises, Therapeutic activity, Neuromuscular re-education, Balance training, Gait training, Patient/Family education, Joint mobilization, Stair training, Dry Needling, Electrical stimulation, Cryotherapy, Moist heat, Taping, Ultrasound, and Manual therapy   PLAN FOR NEXT SESSION: Knee ROM, strengthening, functional mobility, vaso if desired.    Debbe Odea, PT,DPT 04/11/2022, 12:03 PM

## 2022-04-16 ENCOUNTER — Ambulatory Visit (INDEPENDENT_AMBULATORY_CARE_PROVIDER_SITE_OTHER): Payer: Medicare HMO | Admitting: Physical Therapy

## 2022-04-16 ENCOUNTER — Encounter: Payer: Self-pay | Admitting: Physical Therapy

## 2022-04-16 DIAGNOSIS — R262 Difficulty in walking, not elsewhere classified: Secondary | ICD-10-CM | POA: Diagnosis not present

## 2022-04-16 DIAGNOSIS — M25562 Pain in left knee: Secondary | ICD-10-CM

## 2022-04-16 DIAGNOSIS — M6281 Muscle weakness (generalized): Secondary | ICD-10-CM

## 2022-04-16 DIAGNOSIS — M25662 Stiffness of left knee, not elsewhere classified: Secondary | ICD-10-CM | POA: Diagnosis not present

## 2022-04-16 DIAGNOSIS — R600 Localized edema: Secondary | ICD-10-CM | POA: Diagnosis not present

## 2022-04-16 NOTE — Therapy (Signed)
OUTPATIENT PHYSICAL THERAPY TREATMENT NOTE   Patient Name: Kelly Dixon MRN: [id number] DOB:Jun 14, 1946, 76 y.o., female Today's Date: 04/16/2022   END OF SESSION:   PT End of Session - 04/16/22 0858     Visit Number 3    Number of Visits 24    Date for PT Re-Evaluation 06/07/22    Authorization Type [employer]    Progress Note Due on Visit 10    PT Start Time 0845    PT Stop Time 0933    PT Time Calculation (min) 48 min    Activity Tolerance Patient tolerated treatment well    Behavior During Therapy [facility] for tasks assessed/performed             Past Medical History:  Diagnosis Date   Anemia    Arthritis    Breast cancer ([place]) 2020   right breast   Gallstones 05/31/2018   GERD (gastroesophageal reflux disease)    Hypertension    Personal history of radiation therapy    PONV (postoperative nausea and vomiting)    Syphilis    Past Surgical History:  Procedure Laterality Date   BREAST LUMPECTOMY WITH RADIOACTIVE SEED AND SENTINEL LYMPH NODE BIOPSY Right 09/15/2019   Procedure: RIGHT BREAST LUMPECTOMY WITH RADIOACTIVE SEED AND SENTINEL LYMPH NODE BIOPSY;  Surgeon: Donnie Mesa, MD;  Location: [place];  Service: General;  Laterality: Right;   CHOLECYSTECTOMY N/A 04/13/2019   Procedure: LAPAROSCOPIC CHOLECYSTECTOMY WITH INTRAOPERATIVE CHOLANGIOGRAM;  Surgeon: Donnie Mesa, MD;  Location: [place];  Service: General;  Laterality: N/A;   DILATION AND CURETTAGE OF UTERUS     TOTAL KNEE ARTHROPLASTY Left 03/26/2022   Procedure: LEFT TOTAL KNEE ARTHROPLASTY;  Surgeon: Mcarthur Rossetti, MD;  Location: [place];  Service: Orthopedics;  Laterality: Left;   Patient Active Problem List   Diagnosis Date Noted   Status post total left knee replacement 03/26/2022   Unilateral primary osteoarthritis, left knee 01/22/2022   Unilateral primary osteoarthritis, right knee 01/22/2022   Bilateral primary osteoarthritis of knee 01/09/2022   Chronic pain of both knees  10/09/2020   Essential hypertension 09/04/2020   Malignant neoplasm of upper-outer quadrant of right breast in female, estrogen receptor positive ([place]) 09/08/2019     THERAPY DIAG:  Acute pain of left knee  Stiffness of left knee, not elsewhere classified  Muscle weakness (generalized)  Difficulty in walking, not elsewhere classified  Localized edema   PCP: Shelda Pal DO   REFERRING PROVIDER: Mcarthur Rossetti, MD   REFERRING DIAG: [phone] (ICD-10-CM) - Unilateral primary osteoarthritis, left knee     Rationale for Evaluation and Treatment Rehabilitation   ONSET DATE: 03/26/2022 Left TKA   SUBJECTIVE:    SUBJECTIVE STATEMENT: Pt says her knee feels a little worse, feels like it wants    PERTINENT HISTORY: HTN, hx of breast cancer   PAIN:  Are you having pain? Yes: NPRS scale: 4/10 Pain location: lfet knee, calf Pain description: achy Aggravating factors: standing Relieving factors: resting, ice   PRECAUTIONS: Fall   WEIGHT BEARING RESTRICTIONS No   FALLS:  Has patient fallen in last 6 months? No   LIVING ENVIRONMENT: Lives with: lives with their family and lives with their spouse Lives in: House/apartment Stairs: No Has following equipment at home: Single point cane   OCCUPATION: substitute teacher   PLOF: Independent   PATIENT GOALS Get to teaching high school     OBJECTIVE:      PATIENT SURVEYS:  04/09/22: FOTO 61% (  predicted 75%)   COGNITION:           04/09/22: Overall cognitive status: Within functional limits for tasks assessed                             SENSATION: 04/09/22: WFL   EDEMA:  04/09/22: Circumferential: left knee mid patella: 46 centimeters                                          Rt knee mid patella : 44 centimeters   MUSCLE LENGTH: 04/09/22: Hamstrings: Right 58 deg; Left 65 deg           POSTURE:  04/09/22: rounded shoulders, forward head, increased lumbar lordosis, and increased thoracic kyphosis    PALPATION: 04/09/2022: mild tenderness around joint line and incision, bruising noted in lower leg (purple, redness, and yellow coloration)   LOWER EXTREMITY ROM:    ROM Right 04/09/22 Left 04/09/22 Left 04/16/22  Knee flexion A: 115 P: 118 A: 108 P: 110 A:110 P:120  Knee extension A: -20 P: -18 A: -16 P: -12 A:-5   (Blank rows = not tested)   LOWER EXTREMITY MMT:   MMT Right eval Left eval  Hip flexion 5/5 5/5  Hip extension      Hip abduction      Hip adduction      Hip internal rotation      Hip external rotation      Knee flexion 4/5 4-/5  Knee extension 5/5 4-/5  Ankle dorsiflexion      Ankle plantarflexion      Ankle inversion      Ankle eversion       (Blank rows = not tested)       FUNCTIONAL TESTS:  5 times sit to stand: 12 seconds UE support   GAIT: Distance walked: 30 feet  Assistive device utilized: Single point cane Level of assistance: Modified independence Comments: mild antalgic gait pattern       TODAY'S TREATMENT: 04/16/22 -Nu step L5 X 8 min seat 7 UE/LE  -Stantboard stretch 30 sec X 3  -Heel and toe raises 2X15 -March walking and lateral walking in bars 3 round trips -Leg press DL 56# 2X15, then SL 25# 2X15 on left and 1X15 on Rt -Supine  H.S. stretch 3 X 30 sec bilat -Seated LAQ with 5 sec hold X15 -Gait within clinic 50 feet X 2, 75 feet X1 with SPC and supervision  -Manual therapy for left knee PROM to her tolerance  -Vasopnuematic device X 10 min to left knee, low compression, X 10 min, lowest temp.  04/11/22 -Nu step L5 X 8 min seat 7 UE/LE  -Stantboard stretch 30 sec X 3  -Heel and toe raises X 20 -Leg press DL 50# 2X15, then SL 25# X 15 each leg -Seated H.S. stretch 2 X 30 sec bilat -Seated AAROM knee flexion stretch 10 sec X 10 -Seated LAQ with 3 sec hold X15   Manual therapy for left knee PROM to her tolerance   Vasopnuematic device X 10 min to left knee, low compression, X 10 min, lowest temp.  04/09/22 HEP  instruction/performance c cues for techniques, handout provided.  Trial set performed of each for comprehension and symptom assessment.  See below for exercise list.     PATIENT EDUCATION:  Education details:  PT POC, HEP Person educated: Patient Education method: Explanation, Demonstration, Tactile cues, Verbal cues, and Handouts Education comprehension: verbalized understanding and returned demonstration     HOME EXERCISE PROGRAM: Access Code: [email or web address] URL: [email or web address] Date: 04/09/2022 Prepared by: Kearney Hard   Exercises - Supine Bridge  - 3 x daily - 7 x weekly - 2 sets - 10 reps - Hooklying Hamstring Stretch with Strap  - 3 x daily - 7 x weekly - 3 reps - Long Sitting Quad Set with Towel Roll Under Heel  - 3 x daily - 7 x weekly - 2 sets - 10 reps - 5 seocnds hold - Active Straight Leg Raise with Quad Set  - 3 x daily - 7 x weekly - 2 sets - 10 reps - Supine Heel Slide with Strap  - 3 x daily - 7 x weekly - 10 reps - 5-10 seconds hold - Sit to Stand  - 2 x daily - 7 x weekly - 2 sets - 10 reps   ASSESSMENT:   CLINICAL IMPRESSION: She has now met knee flexion ROM goal already. She does still lack some knee extension ROM and strength that we will continue to work to improve with PT as tolerated. Overall I am very pleased with her progress.      OBJECTIVE IMPAIRMENTS decreased balance, decreased endurance, decreased mobility, difficulty walking, decreased ROM, decreased strength, increased edema, impaired flexibility, and pain.    ACTIVITY LIMITATIONS lifting, bending, sitting, standing, squatting, sleeping, stairs, and transfers   PARTICIPATION LIMITATIONS: cleaning, laundry, driving, and community activity   PERSONAL FACTORS : HTN, hx of breast cancer are also affecting patient's functional outcome.    REHAB POTENTIAL: Good   CLINICAL DECISION MAKING: Stable/uncomplicated   EVALUATION COMPLEXITY: Low     GOALS: Goals reviewed with patient?  Yes   SHORT TERM GOALS: Target date: 05/07/2022    Patient will demonstrate independent use of initial home exercise program to maintain progress from in clinic treatments. Goal status: New   Long term PT goals (target dates for all long term goals are 8 weeks  06/07/2022) Patient will demonstrate/report pain at worst less than or equal to 2/10 to facilitate minimal limitation in daily activity secondary to pain symptoms. Goal status: New   Patient will demonstrate independent use of home exercise program to facilitate ability to maintain/progress functional gains from skilled physical therapy services. Goal status: New   Patient will demonstrate FOTO outcome > or = 75 % to indicate reduced disability due to condition. Goal status: New   Pt will improve her Left knee active arc of motion from 5-120 degrees. Goal status: New       5.  Pt will be able to navigate stairs with single rail with step over step gait pattern.   Goal status: New     PLAN: PT FREQUENCY: 2-3 x/ week   PT DURATION: 8 weeks to 06/07/2022   PLANNED INTERVENTIONS: Therapeutic exercises, Therapeutic activity, Neuromuscular re-education, Balance training, Gait training, Patient/Family education, Joint mobilization, Stair training, Dry Needling, Electrical stimulation, Cryotherapy, Moist heat, Taping, Ultrasound, and Manual therapy   PLAN FOR NEXT SESSION: Knee ROM, strengthening, functional mobility, vaso if desired.    Debbe Odea, PT,DPT 04/16/2022, 9:35 AM

## 2022-04-18 ENCOUNTER — Encounter: Payer: Self-pay | Admitting: Physical Therapy

## 2022-04-18 ENCOUNTER — Ambulatory Visit: Payer: Medicare HMO | Admitting: Physical Therapy

## 2022-04-18 DIAGNOSIS — M25562 Pain in left knee: Secondary | ICD-10-CM | POA: Diagnosis not present

## 2022-04-18 DIAGNOSIS — R262 Difficulty in walking, not elsewhere classified: Secondary | ICD-10-CM | POA: Diagnosis not present

## 2022-04-18 DIAGNOSIS — R600 Localized edema: Secondary | ICD-10-CM

## 2022-04-18 DIAGNOSIS — M6281 Muscle weakness (generalized): Secondary | ICD-10-CM | POA: Diagnosis not present

## 2022-04-18 DIAGNOSIS — M25662 Stiffness of left knee, not elsewhere classified: Secondary | ICD-10-CM

## 2022-04-18 NOTE — Therapy (Signed)
OUTPATIENT PHYSICAL THERAPY TREATMENT NOTE   Patient Name: Kelly Dixon MRN: [id number] DOB:[birth year], 76 y.o., female Today's Date: 04/18/2022   END OF SESSION:   PT End of Session - 04/18/22 0906     Visit Number 4    Number of Visits 24    Date for PT Re-Evaluation 06/07/22    Authorization Type [employer]    Progress Note Due on Visit 10    PT Start Time 0845    PT Stop Time 0933    PT Time Calculation (min) 48 min    Activity Tolerance Patient tolerated treatment well    Behavior During Therapy [facility] for tasks assessed/performed             Past Medical History:  Diagnosis Date   Anemia    Arthritis    Breast cancer ([place]) 2020   right breast   Gallstones 05/31/2018   GERD (gastroesophageal reflux disease)    Hypertension    Personal history of radiation therapy    PONV (postoperative nausea and vomiting)    Syphilis    Past Surgical History:  Procedure Laterality Date   BREAST LUMPECTOMY WITH RADIOACTIVE SEED AND SENTINEL LYMPH NODE BIOPSY Right 09/15/2019   Procedure: RIGHT BREAST LUMPECTOMY WITH RADIOACTIVE SEED AND SENTINEL LYMPH NODE BIOPSY;  Surgeon: Donnie Mesa, MD;  Location: [place];  Service: General;  Laterality: Right;   CHOLECYSTECTOMY N/A 04/13/2019   Procedure: LAPAROSCOPIC CHOLECYSTECTOMY WITH INTRAOPERATIVE CHOLANGIOGRAM;  Surgeon: Donnie Mesa, MD;  Location: [place];  Service: General;  Laterality: N/A;   DILATION AND CURETTAGE OF UTERUS     TOTAL KNEE ARTHROPLASTY Left 03/26/2022   Procedure: LEFT TOTAL KNEE ARTHROPLASTY;  Surgeon: Mcarthur Rossetti, MD;  Location: [place];  Service: Orthopedics;  Laterality: Left;   Patient Active Problem List   Diagnosis Date Noted   Status post total left knee replacement 03/26/2022   Unilateral primary osteoarthritis, left knee 01/22/2022   Unilateral primary osteoarthritis, right knee 01/22/2022   Bilateral primary osteoarthritis of knee 01/09/2022   Chronic pain of both knees  10/09/2020   Essential hypertension 09/04/2020   Malignant neoplasm of upper-outer quadrant of right breast in female, estrogen receptor positive ([place]) 09/08/2019     THERAPY DIAG:  Acute pain of left knee  Stiffness of left knee, not elsewhere classified  Muscle weakness (generalized)  Difficulty in walking, not elsewhere classified  Localized edema   PCP: Shelda Pal DO   REFERRING PROVIDER: Mcarthur Rossetti, MD   REFERRING DIAG: [phone] (ICD-10-CM) - Unilateral primary osteoarthritis, left knee     Rationale for Evaluation and Treatment Rehabilitation   ONSET DATE: 03/26/2022 Left TKA   SUBJECTIVE:    SUBJECTIVE STATEMENT: Pt says her knee feels good, she was able to walk 20 minutes yesterday and not have increased pain from it   PERTINENT HISTORY: HTN, hx of breast cancer   PAIN:  Are you having pain? Yes: NPRS scale: 4/10 Pain location: lfet knee, calf Pain description: achy Aggravating factors: standing Relieving factors: resting, ice   PRECAUTIONS: Fall   WEIGHT BEARING RESTRICTIONS No   FALLS:  Has patient fallen in last 6 months? No   LIVING ENVIRONMENT: Lives with: lives with their family and lives with their spouse Lives in: House/apartment Stairs: No Has following equipment at home: Single point cane   OCCUPATION: substitute teacher   PLOF: Independent   PATIENT GOALS Get to teaching high school     OBJECTIVE:  PATIENT SURVEYS:  04/09/22: FOTO 61% (predicted 75%)   COGNITION:           04/09/22: Overall cognitive status: Within functional limits for tasks assessed                             SENSATION: 04/09/22: WFL   EDEMA:  04/09/22: Circumferential: left knee mid patella: 46 centimeters                                          Rt knee mid patella : 44 centimeters   MUSCLE LENGTH: 04/09/22: Hamstrings: Right 58 deg; Left 65 deg           POSTURE:  04/09/22: rounded shoulders, forward head, increased  lumbar lordosis, and increased thoracic kyphosis   PALPATION: 04/09/2022: mild tenderness around joint line and incision, bruising noted in lower leg (purple, redness, and yellow coloration)   LOWER EXTREMITY ROM:    ROM Right 04/09/22 Left 04/09/22 Left 04/16/22  Knee flexion A: 115 P: 118 A: 108 P: 110 A:110 P:120  Knee extension A: -20 P: -18 A: -16 P: -12 A:-5   (Blank rows = not tested)   LOWER EXTREMITY MMT:   MMT Right eval Left eval  Hip flexion 5/5 5/5  Hip extension      Hip abduction      Hip adduction      Hip internal rotation      Hip external rotation      Knee flexion 4/5 4-/5  Knee extension 5/5 4-/5  Ankle dorsiflexion      Ankle plantarflexion      Ankle inversion      Ankle eversion       (Blank rows = not tested)       FUNCTIONAL TESTS:  5 times sit to stand: 12 seconds UE support   GAIT: Distance walked: 30 feet  Assistive device utilized: Single point cane Level of assistance: Modified independence Comments: mild antalgic gait pattern       TODAY'S TREATMENT: 04/18/22 -Nu step L5 X 8 min seat 7 UE/LE  -gastroc and soleus stretch 30 sec X 2 each on left leg -Step ups onto 6 inch step leading with left X 10 fwd and X 10 lateral -March walking and tandem walk in bars 3 round trips -Leg press DL 56# 2X15, then SL 25# 2X15 on left and 1X15 on Rt -Supine  H.S. stretch 3 X 30 sec bilat -Seated LAQ with red band X15 -Seated SLR  -Gait without AD in clinic, mod I now (decreased speed)  -Manual therapy for left knee PROM with overpressure into extension to her tolerance  -Vasopnuematic device X 10 min to left knee, low compression, X 10 min, lowest temp.  04/16/22 -Nu step L5 X 8 min seat 7 UE/LE  -Stantboard stretch 30 sec X 3  -Heel and toe raises 2X15 -March walking and lateral walking in bars 3 round trips -Leg press DL 56# 2X15, then SL 25# 2X15 on left and 1X15 on Rt -Supine  H.S. stretch 3 X 30 sec bilat -Seated LAQ with 5 sec  hold X15 -Gait within clinic 50 feet X 2, 75 feet X1 with SPC and supervision  -Manual therapy for left knee PROM to her tolerance  -Vasopnuematic device X 10 min to left knee, low compression, X  10 min, lowest temp.  04/11/22 -Nu step L5 X 8 min seat 7 UE/LE  -Stantboard stretch 30 sec X 3  -Heel and toe raises X 20 -Leg press DL 50# 2X15, then SL 25# X 15 each leg -Seated H.S. stretch 2 X 30 sec bilat -Seated AAROM knee flexion stretch 10 sec X 10 -Seated LAQ with 3 sec hold X15   Manual therapy for left knee PROM to her tolerance   Vasopnuematic device X 10 min to left knee, low compression, X 10 min, lowest temp.  04/09/22 HEP instruction/performance c cues for techniques, handout provided.  Trial set performed of each for comprehension and symptom assessment.  See below for exercise list.     PATIENT EDUCATION:  Education details: PT POC, HEP Person educated: Patient Education method: Explanation, Demonstration, Tactile cues, Verbal cues, and Handouts Education comprehension: verbalized understanding and returned demonstration     HOME EXERCISE PROGRAM: Access Code: [email or web address] URL: [email or web address] Date: 04/09/2022 Prepared by: Kearney Hard   Exercises - Supine Bridge  - 3 x daily - 7 x weekly - 2 sets - 10 reps - Hooklying Hamstring Stretch with Strap  - 3 x daily - 7 x weekly - 3 reps - Long Sitting Quad Set with Towel Roll Under Heel  - 3 x daily - 7 x weekly - 2 sets - 10 reps - 5 seocnds hold - Active Straight Leg Raise with Quad Set  - 3 x daily - 7 x weekly - 2 sets - 10 reps - Supine Heel Slide with Strap  - 3 x daily - 7 x weekly - 10 reps - 5-10 seconds hold - Sit to Stand  - 2 x daily - 7 x weekly - 2 sets - 10 reps   ASSESSMENT:   CLINICAL IMPRESSION: She is making excellent progress, likely will only need 2-3 more weeks of PT if she continues to progress the way she is.     OBJECTIVE IMPAIRMENTS decreased balance, decreased  endurance, decreased mobility, difficulty walking, decreased ROM, decreased strength, increased edema, impaired flexibility, and pain.    ACTIVITY LIMITATIONS lifting, bending, sitting, standing, squatting, sleeping, stairs, and transfers   PARTICIPATION LIMITATIONS: cleaning, laundry, driving, and community activity   PERSONAL FACTORS : HTN, hx of breast cancer are also affecting patient's functional outcome.    REHAB POTENTIAL: Good   CLINICAL DECISION MAKING: Stable/uncomplicated   EVALUATION COMPLEXITY: Low     GOALS: Goals reviewed with patient? Yes   SHORT TERM GOALS: Target date: 05/07/2022    Patient will demonstrate independent use of initial home exercise program to maintain progress from in clinic treatments. Goal status: New   Long term PT goals (target dates for all long term goals are 8 weeks  06/07/2022) Patient will demonstrate/report pain at worst less than or equal to 2/10 to facilitate minimal limitation in daily activity secondary to pain symptoms. Goal status: New   Patient will demonstrate independent use of home exercise program to facilitate ability to maintain/progress functional gains from skilled physical therapy services. Goal status: New   Patient will demonstrate FOTO outcome > or = 75 % to indicate reduced disability due to condition. Goal status: New   Pt will improve her Left knee active arc of motion from 5-120 degrees. Goal status: New       5.  Pt will be able to navigate stairs with single rail with step over step gait pattern.   Goal status: New     PLAN: PT  FREQUENCY: 2-3 x/ week   PT DURATION: 8 weeks to 06/07/2022   PLANNED INTERVENTIONS: Therapeutic exercises, Therapeutic activity, Neuromuscular re-education, Balance training, Gait training, Patient/Family education, Joint mobilization, Stair training, Dry Needling, Electrical stimulation, Cryotherapy, Moist heat, Taping, Ultrasound, and Manual therapy   PLAN FOR NEXT SESSION: Knee  extension ROM, strengthening, dynamic gait/balance activity, vaso if desired.    Debbe Odea, PT,DPT 04/18/2022, 9:06 AM

## 2022-04-23 ENCOUNTER — Ambulatory Visit: Payer: Medicare HMO | Admitting: Physical Therapy

## 2022-04-23 ENCOUNTER — Encounter: Payer: Self-pay | Admitting: Physical Therapy

## 2022-04-23 DIAGNOSIS — M6281 Muscle weakness (generalized): Secondary | ICD-10-CM | POA: Diagnosis not present

## 2022-04-23 DIAGNOSIS — R262 Difficulty in walking, not elsewhere classified: Secondary | ICD-10-CM | POA: Diagnosis not present

## 2022-04-23 DIAGNOSIS — R600 Localized edema: Secondary | ICD-10-CM

## 2022-04-23 DIAGNOSIS — M25562 Pain in left knee: Secondary | ICD-10-CM | POA: Diagnosis not present

## 2022-04-23 DIAGNOSIS — M25662 Stiffness of left knee, not elsewhere classified: Secondary | ICD-10-CM

## 2022-04-23 NOTE — Therapy (Signed)
OUTPATIENT PHYSICAL THERAPY TREATMENT NOTE   Patient Name: Kelly Dixon MRN: [id number] DOB:June 16, 1946, 76 y.o., female Today's Date: 04/23/2022   END OF SESSION:   PT End of Session - 04/23/22 0934     Visit Number 5    Number of Visits 24    Date for PT Re-Evaluation 06/07/22    Authorization Type [employer]    Progress Note Due on Visit 10    PT Start Time 0930    PT Stop Time 1019    PT Time Calculation (min) 49 min    Activity Tolerance Patient tolerated treatment well    Behavior During Therapy [facility] for tasks assessed/performed             Past Medical History:  Diagnosis Date   Anemia    Arthritis    Breast cancer ([place]) 2020   right breast   Gallstones 05/31/2018   GERD (gastroesophageal reflux disease)    Hypertension    Personal history of radiation therapy    PONV (postoperative nausea and vomiting)    Syphilis    Past Surgical History:  Procedure Laterality Date   BREAST LUMPECTOMY WITH RADIOACTIVE SEED AND SENTINEL LYMPH NODE BIOPSY Right 09/15/2019   Procedure: RIGHT BREAST LUMPECTOMY WITH RADIOACTIVE SEED AND SENTINEL LYMPH NODE BIOPSY;  Surgeon: Donnie Mesa, MD;  Location: [place];  Service: General;  Laterality: Right;   CHOLECYSTECTOMY N/A 04/13/2019   Procedure: LAPAROSCOPIC CHOLECYSTECTOMY WITH INTRAOPERATIVE CHOLANGIOGRAM;  Surgeon: Donnie Mesa, MD;  Location: [place];  Service: General;  Laterality: N/A;   DILATION AND CURETTAGE OF UTERUS     TOTAL KNEE ARTHROPLASTY Left 03/26/2022   Procedure: LEFT TOTAL KNEE ARTHROPLASTY;  Surgeon: Mcarthur Rossetti, MD;  Location: [place];  Service: Orthopedics;  Laterality: Left;   Patient Active Problem List   Diagnosis Date Noted   Status post total left knee replacement 03/26/2022   Unilateral primary osteoarthritis, left knee 01/22/2022   Unilateral primary osteoarthritis, right knee 01/22/2022   Bilateral primary osteoarthritis of knee 01/09/2022   Chronic pain of both knees  10/09/2020   Essential hypertension 09/04/2020   Malignant neoplasm of upper-outer quadrant of right breast in female, estrogen receptor positive ([place]) 09/08/2019     THERAPY DIAG:  Acute pain of left knee  Stiffness of left knee, not elsewhere classified  Muscle weakness (generalized)  Difficulty in walking, not elsewhere classified  Localized edema   PCP: Shelda Pal DO   REFERRING PROVIDER: Mcarthur Rossetti, MD   REFERRING DIAG: [phone] (ICD-10-CM) - Unilateral primary osteoarthritis, left knee     Rationale for Evaluation and Treatment Rehabilitation   ONSET DATE: 03/26/2022 Left TKA   SUBJECTIVE:    SUBJECTIVE STATEMENT: Pt stating no pain at rest. Pt reporting she left her cane in the car today walking into the clinic.    PERTINENT HISTORY: HTN, hx of breast cancer   PAIN:  Are you having pain? Yes: NPRS scale: 4/10 Pain location: lfet knee, calf Pain description: achy Aggravating factors: standing Relieving factors: resting, ice   PRECAUTIONS: Fall   WEIGHT BEARING RESTRICTIONS No   FALLS:  Has patient fallen in last 6 months? No   LIVING ENVIRONMENT: Lives with: lives with their family and lives with their spouse Lives in: House/apartment Stairs: No Has following equipment at home: Single point cane   OCCUPATION: substitute teacher   PLOF: Independent   PATIENT GOALS Get to teaching high school     OBJECTIVE:  PATIENT SURVEYS:  04/09/22: FOTO 61% (predicted 75%)   COGNITION:           04/09/22: Overall cognitive status: Within functional limits for tasks assessed                             SENSATION: 04/09/22: WFL   EDEMA:  04/09/22: Circumferential: left knee mid patella: 46 centimeters                                          Rt knee mid patella : 44 centimeters   MUSCLE LENGTH: 04/09/22: Hamstrings: Right 58 deg; Left 65 deg           POSTURE:  04/09/22: rounded shoulders, forward head, increased lumbar  lordosis, and increased thoracic kyphosis   PALPATION: 04/09/2022: mild tenderness around joint line and incision, bruising noted in lower leg (purple, redness, and yellow coloration)   LOWER EXTREMITY ROM:    ROM Right 04/09/22 Left 04/09/22 Left 04/16/22 Left 04/23/22  Knee flexion A: 115 P: 118 A: 108 P: 110 A:110 P:120 A: 112 P: 116  Knee extension A: -20 P: -18 A: -16 P: -12 A:-5 A: -8 P: -5   (Blank rows = not tested)   LOWER EXTREMITY MMT:   MMT Right eval Left eval  Hip flexion 5/5 5/5  Hip extension      Hip abduction      Hip adduction      Hip internal rotation      Hip external rotation      Knee flexion 4/5 4-/5  Knee extension 5/5 4-/5  Ankle dorsiflexion      Ankle plantarflexion      Ankle inversion      Ankle eversion       (Blank rows = not tested)       FUNCTIONAL TESTS:  5 times sit to stand: 12 seconds UE support   GAIT: Distance walked: 30 feet  Assistive device utilized: Single point cane Level of assistance: Modified independence Comments: mild antalgic gait pattern       TODAY'S TREATMENT: 04/23/22 TherEx: Recumbent bike: seat at 5, Level 3 x 8 minutes gastroc and soleus stretch x 2 each holding 20 seconds on slant board Step ups onto 6 inch step leading with left X 10 fwd and X 10 lateral Standing on Airex: marching x 20, cone taps x 20 (2 cones set up at front corners of mat) Leg press DL 62# 2X15, Left LE only 25# 2X15  Seated SLR x 15 Mini squats at TM bar Manual:  for left knee PROM with overpressure into extension to her tolerance Modalities:  Vasopnuematic device X 10 min to left knee, low compression, X 10 min, lowest temp.  04/18/22 -Nu step L5 X 8 min seat 7 UE/LE  -gastroc and soleus stretch 30 sec X 2 each on left leg -Step ups onto 6 inch step leading with left X 10 fwd and X 10 lateral -March walking and tandem walk in bars 3 round trips -Leg press DL 56# 2X15, then SL 25# 2X15 on left and 1X15 on Rt -Supine   H.S. stretch 3 X 30 sec bilat -Seated LAQ with red band X15 -Seated SLR  -Gait without AD in clinic, mod I now (decreased speed)  -Manual therapy for left knee PROM with overpressure into  extension to her tolerance  -Vasopnuematic device X 10 min to left knee, low compression, X 10 min, lowest temp.  04/16/22 -Nu step L5 X 8 min seat 7 UE/LE  -Stantboard stretch 30 sec X 3  -Heel and toe raises 2X15 -March walking and lateral walking in bars 3 round trips -Leg press DL 56# 2X15, then SL 25# 2X15 on left and 1X15 on Rt -Supine  H.S. stretch 3 X 30 sec bilat -Seated LAQ with 5 sec hold X15 -Gait within clinic 50 feet X 2, 75 feet X1 with SPC and supervision  -Manual therapy for left knee PROM to her tolerance  -Vasopnuematic device X 10 min to left knee, low compression, X 10 min, lowest temp.  04/11/22 -Nu step L5 X 8 min seat 7 UE/LE  -Stantboard stretch 30 sec X 3  -Heel and toe raises X 20 -Leg press DL 50# 2X15, then SL 25# X 15 each leg -Seated H.S. stretch 2 X 30 sec bilat -Seated AAROM knee flexion stretch 10 sec X 10 -Seated LAQ with 3 sec hold X15   Manual therapy for left knee PROM to her tolerance   Vasopnuematic device X 10 min to left knee, low compression, X 10 min, lowest temp.     PATIENT EDUCATION:  Education details: PT POC, HEP Person educated: Patient Education method: Explanation, Demonstration, Tactile cues, Verbal cues, and Handouts Education comprehension: verbalized understanding and returned demonstration     HOME EXERCISE PROGRAM: Access Code: [email or web address] URL: [email or web address] Date: 04/09/2022 Prepared by: Kearney Hard   Exercises - Supine Bridge  - 3 x daily - 7 x weekly - 2 sets - 10 reps - Hooklying Hamstring Stretch with Strap  - 3 x daily - 7 x weekly - 3 reps - Long Sitting Quad Set with Towel Roll Under Heel  - 3 x daily - 7 x weekly - 2 sets - 10 reps - 5 seocnds hold - Active Straight Leg Raise with Quad Set  -  3 x daily - 7 x weekly - 2 sets - 10 reps - Supine Heel Slide with Strap  - 3 x daily - 7 x weekly - 10 reps - 5-10 seconds hold - Sit to Stand  - 2 x daily - 7 x weekly - 2 sets - 10 reps   ASSESSMENT:   CLINICAL IMPRESSION: Pt reporting no pain in left knee at rest. Treatment focusing on LE strengthening and beginning dynamic balance. Active left knee flexion 112 degrees in supine. Continue skilled PT progressing toward goals set.      OBJECTIVE IMPAIRMENTS decreased balance, decreased endurance, decreased mobility, difficulty walking, decreased ROM, decreased strength, increased edema, impaired flexibility, and pain.    ACTIVITY LIMITATIONS lifting, bending, sitting, standing, squatting, sleeping, stairs, and transfers   PARTICIPATION LIMITATIONS: cleaning, laundry, driving, and community activity   PERSONAL FACTORS : HTN, hx of breast cancer are also affecting patient's functional outcome.    REHAB POTENTIAL: Good   CLINICAL DECISION MAKING: Stable/uncomplicated   EVALUATION COMPLEXITY: Low     GOALS: Goals reviewed with patient? Yes   SHORT TERM GOALS: Target date: 05/07/2022    Patient will demonstrate independent use of initial home exercise program to maintain progress from in clinic treatments. Goal status: MET 04/23/22   Long term PT goals (target dates for all long term goals are 8 weeks  06/07/2022) Patient will demonstrate/report pain at worst less than or equal to 2/10 to facilitate minimal limitation in daily activity secondary to  pain symptoms. Goal status: New   Patient will demonstrate independent use of home exercise program to facilitate ability to maintain/progress functional gains from skilled physical therapy services. Goal status: New   Patient will demonstrate FOTO outcome > or = 75 % to indicate reduced disability due to condition. Goal status: New   Pt will improve her Left knee active arc of motion from 5-120 degrees. Goal status: New       5.  Pt  will be able to navigate stairs with single rail with step over step gait pattern.   Goal status: New     PLAN: PT FREQUENCY: 2-3 x/ week   PT DURATION: 8 weeks to 06/07/2022   PLANNED INTERVENTIONS: Therapeutic exercises, Therapeutic activity, Neuromuscular re-education, Balance training, Gait training, Patient/Family education, Joint mobilization, Stair training, Dry Needling, Electrical stimulation, Cryotherapy, Moist heat, Taping, Ultrasound, and Manual therapy   PLAN FOR NEXT SESSION: Knee extension ROM, strengthening, dynamic gait/balance activity, vaso if desired.    Oretha Caprice, PT, MPT 04/23/2022, 9:37 AM

## 2022-04-29 ENCOUNTER — Ambulatory Visit: Payer: Medicare HMO | Admitting: Physical Therapy

## 2022-04-29 ENCOUNTER — Encounter: Payer: Self-pay | Admitting: Physical Therapy

## 2022-04-29 DIAGNOSIS — R262 Difficulty in walking, not elsewhere classified: Secondary | ICD-10-CM

## 2022-04-29 DIAGNOSIS — M25562 Pain in left knee: Secondary | ICD-10-CM

## 2022-04-29 DIAGNOSIS — M25662 Stiffness of left knee, not elsewhere classified: Secondary | ICD-10-CM

## 2022-04-29 DIAGNOSIS — R600 Localized edema: Secondary | ICD-10-CM

## 2022-04-29 DIAGNOSIS — M6281 Muscle weakness (generalized): Secondary | ICD-10-CM | POA: Diagnosis not present

## 2022-04-29 NOTE — Therapy (Signed)
OUTPATIENT PHYSICAL THERAPY TREATMENT NOTE   Patient Name: Kelly Dixon MRN: [id number] DOB:1945-12-29, 76 y.o., female Today's Date: 04/29/2022   END OF SESSION:   PT End of Session - 04/29/22 0809     Visit Number 6    Number of Visits 24    Date for PT Re-Evaluation 06/07/22    Authorization Type [employer]    Progress Note Due on Visit 10    PT Start Time 0800    PT Stop Time 0842    PT Time Calculation (min) 42 min    Activity Tolerance Patient tolerated treatment well    Behavior During Therapy [facility] for tasks assessed/performed              Past Medical History:  Diagnosis Date   Anemia    Arthritis    Breast cancer ([place]) 2020   right breast   Gallstones 05/31/2018   GERD (gastroesophageal reflux disease)    Hypertension    Personal history of radiation therapy    PONV (postoperative nausea and vomiting)    Syphilis    Past Surgical History:  Procedure Laterality Date   BREAST LUMPECTOMY WITH RADIOACTIVE SEED AND SENTINEL LYMPH NODE BIOPSY Right 09/15/2019   Procedure: RIGHT BREAST LUMPECTOMY WITH RADIOACTIVE SEED AND SENTINEL LYMPH NODE BIOPSY;  Surgeon: Donnie Mesa, MD;  Location: [place];  Service: General;  Laterality: Right;   CHOLECYSTECTOMY N/A 04/13/2019   Procedure: LAPAROSCOPIC CHOLECYSTECTOMY WITH INTRAOPERATIVE CHOLANGIOGRAM;  Surgeon: Donnie Mesa, MD;  Location: [place];  Service: General;  Laterality: N/A;   DILATION AND CURETTAGE OF UTERUS     TOTAL KNEE ARTHROPLASTY Left 03/26/2022   Procedure: LEFT TOTAL KNEE ARTHROPLASTY;  Surgeon: Mcarthur Rossetti, MD;  Location: [place];  Service: Orthopedics;  Laterality: Left;   Patient Active Problem List   Diagnosis Date Noted   Status post total left knee replacement 03/26/2022   Unilateral primary osteoarthritis, left knee 01/22/2022   Unilateral primary osteoarthritis, right knee 01/22/2022   Bilateral primary osteoarthritis of knee 01/09/2022   Chronic pain of both  knees 10/09/2020   Essential hypertension 09/04/2020   Malignant neoplasm of upper-outer quadrant of right breast in female, estrogen receptor positive ([place]) 09/08/2019     THERAPY DIAG:  Acute pain of left knee  Stiffness of left knee, not elsewhere classified  Muscle weakness (generalized)  Difficulty in walking, not elsewhere classified  Localized edema   PCP: Shelda Pal DO   REFERRING PROVIDER: Mcarthur Rossetti, MD   REFERRING DIAG: [phone] (ICD-10-CM) - Unilateral primary osteoarthritis, left knee     Rationale for Evaluation and Treatment Rehabilitation   ONSET DATE: 03/26/2022 Left TKA   SUBJECTIVE:    SUBJECTIVE STATEMENT: Pt stating no pain upon arrival. Pt stating her knee is worse at night. Pt states she is having trouble sleeping due to increased "pressure" feeling in her knee.    PERTINENT HISTORY: HTN, hx of breast cancer   PAIN:  Are you having pain? No  Pain location: lfet knee, calf Pain description: achy Aggravating factors: standing Relieving factors: resting, ice   PRECAUTIONS: Fall   WEIGHT BEARING RESTRICTIONS No   FALLS:  Has patient fallen in last 6 months? No   LIVING ENVIRONMENT: Lives with: lives with their family and lives with their spouse Lives in: House/apartment Stairs: No Has following equipment at home: Single point cane   OCCUPATION: substitute teacher   PLOF: Independent   PATIENT GOALS Get to teaching high school  OBJECTIVE:      PATIENT SURVEYS:  04/09/22: FOTO 61% (predicted 75%)   COGNITION:           04/09/22: Overall cognitive status: Within functional limits for tasks assessed                             SENSATION: 04/09/22: WFL   EDEMA:  04/09/22: Circumferential: left knee mid patella: 46 centimeters                                          Rt knee mid patella : 44 centimeters   MUSCLE LENGTH: 04/09/22: Hamstrings: Right 58 deg; Left 65 deg           POSTURE:  04/09/22:  rounded shoulders, forward head, increased lumbar lordosis, and increased thoracic kyphosis   PALPATION: 04/09/2022: mild tenderness around joint line and incision, bruising noted in lower leg (purple, redness, and yellow coloration)   LOWER EXTREMITY ROM:    ROM Right 04/09/22 Left 04/09/22 Left 04/16/22 Left 04/23/22 Left 04/29/22  Knee flexion A: 115 P: 118 A: 108 P: 110 A:110 P:120 A: 112 P: 116 A: 112 P: 115  Knee extension A: -20 P: -18 A: -16 P: -12 A:-5 A: -8 P: -5 A :-8 P: -5   (Blank rows = not tested)   LOWER EXTREMITY MMT:   MMT Right eval Left eval  Hip flexion 5/5 5/5  Hip extension      Hip abduction      Hip adduction      Hip internal rotation      Hip external rotation      Knee flexion 4/5 4-/5  Knee extension 5/5 4-/5  Ankle dorsiflexion      Ankle plantarflexion      Ankle inversion      Ankle eversion       (Blank rows = not tested)       FUNCTIONAL TESTS:  5 times sit to stand: 12 seconds UE support   GAIT: Distance walked: 30 feet  Assistive device utilized: Single point cane Level of assistance: Modified independence Comments: mild antalgic gait pattern       TODAY'S TREATMENT: 04/23/22 TherEx: Recumbent bike: seat at 5, Level 3 x 8 minutes gastroc and soleus stretch x 2 each holding 30 seconds on slant board Step ups onto 6 inch step leading with left X 15 fwd and X 15 lateral Airex beam: walking forward x 6 c intermittent support,  Airex beam: tandem stance holding 30 seconds c finger tap support, tandem stance with head turns c UE support Leg press DL 75# 3 x 10, Left LE only 37# 3 x 10 Side stepping toward 3 cones in diagonal pattern performed x 5 in each direction Standing lunges reaching in diagonal pattern toward 2 cones placed behind pt, Pt stating lunging with her Rt knee was painful, so pt continued only with left LE x 10 Side stepping c mini squat Mini squats at TM bar x 15  Manual:  for left knee PROM with  overpressure into extension to her tolerance Modalities:  Vasopnuematic device X 10 min to left knee, medium compression, X 10 min, 34 degrees  04/23/22 TherEx: Recumbent bike: seat at 5, Level 3 x 8 minutes gastroc and soleus stretch x 2 each holding 20 seconds on slant board  Step ups onto 6 inch step leading with left X 10 fwd and X 10 lateral Standing on Airex: marching x 20, cone taps x 20 (2 cones set up at front corners of mat) Leg press DL 62# 2X15, Left LE only 25# 2X15  Seated SLR x 15 Mini squats at TM bar Manual:  for left knee PROM with overpressure into extension to her tolerance Modalities:  Vasopnuematic device X 10 min to left knee, low compression, X 10 min, lowest temp.  04/18/22 -Nu step L5 X 8 min seat 7 UE/LE  -gastroc and soleus stretch 30 sec X 2 each on left leg -Step ups onto 6 inch step leading with left X 10 fwd and X 10 lateral -March walking and tandem walk in bars 3 round trips -Leg press DL 56# 2X15, then SL 25# 2X15 on left and 1X15 on Rt -Supine  H.S. stretch 3 X 30 sec bilat -Seated LAQ with red band X15 -Seated SLR  -Gait without AD in clinic, mod I now (decreased speed)  -Manual therapy for left knee PROM with overpressure into extension to her tolerance  -Vasopnuematic device X 10 min to left knee, low compression, X 10 min, lowest temp.      PATIENT EDUCATION:  Education details: PT POC, HEP Person educated: Patient Education method: Explanation, Demonstration, Tactile cues, Verbal cues, and Handouts Education comprehension: verbalized understanding and returned demonstration     HOME EXERCISE PROGRAM: Access Code: [email or web address] URL: [email or web address] Date: 04/09/2022 Prepared by: Kearney Hard   Exercises - Supine Bridge  - 3 x daily - 7 x weekly - 2 sets - 10 reps - Hooklying Hamstring Stretch with Strap  - 3 x daily - 7 x weekly - 3 reps - Long Sitting Quad Set with Towel Roll Under Heel  - 3 x daily - 7 x  weekly - 2 sets - 10 reps - 5 seocnds hold - Active Straight Leg Raise with Quad Set  - 3 x daily - 7 x weekly - 2 sets - 10 reps - Supine Heel Slide with Strap  - 3 x daily - 7 x weekly - 10 reps - 5-10 seconds hold - Sit to Stand  - 2 x daily - 7 x weekly - 2 sets - 10 reps   ASSESSMENT:   CLINICAL IMPRESSION: Pt stating she has signed up with a gym membership and will be going 2 days each week. Pt is amb with no device currently with mild antalgic gait. Pt reporting today when performing alternating lunges that her Rt knee was giving her more problems than her left. Pt reporting more swelling and pain at night when trying to get to sleep. Continue skilled PT progressing with AROM, strength and dynamic balance.      OBJECTIVE IMPAIRMENTS decreased balance, decreased endurance, decreased mobility, difficulty walking, decreased ROM, decreased strength, increased edema, impaired flexibility, and pain.    ACTIVITY LIMITATIONS lifting, bending, sitting, standing, squatting, sleeping, stairs, and transfers   PARTICIPATION LIMITATIONS: cleaning, laundry, driving, and community activity   PERSONAL FACTORS : HTN, hx of breast cancer are also affecting patient's functional outcome.    REHAB POTENTIAL: Good   CLINICAL DECISION MAKING: Stable/uncomplicated   EVALUATION COMPLEXITY: Low     GOALS: Goals reviewed with patient? Yes   SHORT TERM GOALS: Target date: 05/07/2022    Patient will demonstrate independent use of initial home exercise program to maintain progress from in clinic treatments. Goal status: MET 04/23/22   Long term PT  goals (target dates for all long term goals are 8 weeks  06/07/2022) Patient will demonstrate/report pain at worst less than or equal to 2/10 to facilitate minimal limitation in daily activity secondary to pain symptoms. Goal status: MET 04/29/22   Patient will demonstrate independent use of home exercise program to facilitate ability to maintain/progress  functional gains from skilled physical therapy services. Goal status: New   Patient will demonstrate FOTO outcome > or = 75 % to indicate reduced disability due to condition. Goal status: New   Pt will improve her Left knee active arc of motion from 5-120 degrees. Goal status: On-going 04/29/22       5.  Pt will be able to navigate stairs with single rail with step over step gait pattern.   Goal status: New     PLAN: PT FREQUENCY: 2-3 x/ week   PT DURATION: 8 weeks to 06/07/2022   PLANNED INTERVENTIONS: Therapeutic exercises, Therapeutic activity, Neuromuscular re-education, Balance training, Gait training, Patient/Family education, Joint mobilization, Stair training, Dry Needling, Electrical stimulation, Cryotherapy, Moist heat, Taping, Ultrasound, and Manual therapy   PLAN FOR NEXT SESSION: Try stairs in lobby next session. Knee extension ROM, strengthening, dynamic gait/balance activity, vaso if desired.    Oretha Caprice, PT, MPT 04/29/2022, 8:22 AM

## 2022-05-01 ENCOUNTER — Encounter: Payer: Self-pay | Admitting: Physical Therapy

## 2022-05-01 ENCOUNTER — Ambulatory Visit (INDEPENDENT_AMBULATORY_CARE_PROVIDER_SITE_OTHER): Payer: Medicare HMO | Admitting: Physical Therapy

## 2022-05-01 DIAGNOSIS — R600 Localized edema: Secondary | ICD-10-CM | POA: Diagnosis not present

## 2022-05-01 DIAGNOSIS — M25562 Pain in left knee: Secondary | ICD-10-CM

## 2022-05-01 DIAGNOSIS — M25662 Stiffness of left knee, not elsewhere classified: Secondary | ICD-10-CM

## 2022-05-01 DIAGNOSIS — M6281 Muscle weakness (generalized): Secondary | ICD-10-CM

## 2022-05-01 DIAGNOSIS — R262 Difficulty in walking, not elsewhere classified: Secondary | ICD-10-CM

## 2022-05-01 NOTE — Therapy (Signed)
OUTPATIENT PHYSICAL THERAPY TREATMENT NOTE   Patient Name: Kelly Dixon MRN: [id number] DOB:1945-12-14, 76 y.o., female Today's Date: 05/01/2022   END OF SESSION:   PT End of Session - 05/01/22 0806     Visit Number 6    Number of Visits 24    Date for PT Re-Evaluation 06/07/22    Authorization Type [employer]    Progress Note Due on Visit 10    PT Start Time 0802    PT Stop Time 0842    PT Time Calculation (min) 40 min    Activity Tolerance Patient tolerated treatment well    Behavior During Therapy [facility] for tasks assessed/performed               Past Medical History:  Diagnosis Date   Anemia    Arthritis    Breast cancer (HCC) 2020   right breast   Gallstones 05/31/2018   GERD (gastroesophageal reflux disease)    Hypertension    Personal history of radiation therapy    PONV (postoperative nausea and vomiting)    Syphilis    Past Surgical History:  Procedure Laterality Date   BREAST LUMPECTOMY WITH RADIOACTIVE SEED AND SENTINEL LYMPH NODE BIOPSY Right 09/15/2019   Procedure: RIGHT BREAST LUMPECTOMY WITH RADIOACTIVE SEED AND SENTINEL LYMPH NODE BIOPSY;  Surgeon: [name], MD;  Location: [facility];  Service: General;  Laterality: Right;   CHOLECYSTECTOMY N/A 04/13/2019   Procedure: LAPAROSCOPIC CHOLECYSTECTOMY WITH INTRAOPERATIVE CHOLANGIOGRAM;  Surgeon: [name], MD;  Location: [facility];  Service: General;  Laterality: N/A;   DILATION AND CURETTAGE OF UTERUS     TOTAL KNEE ARTHROPLASTY Left 03/26/2022   Procedure: LEFT TOTAL KNEE ARTHROPLASTY;  Surgeon: Kathryne Hitch, MD;  Location: [facility];  Service: Orthopedics;  Laterality: Left;   Patient Active Problem List   Diagnosis Date Noted   Status post total left knee replacement 03/26/2022   Unilateral primary osteoarthritis, left knee 01/22/2022   Unilateral primary osteoarthritis, right knee 01/22/2022   Bilateral primary osteoarthritis of knee 01/09/2022   Chronic pain of both  knees 10/09/2020   Essential hypertension 09/04/2020   Malignant neoplasm of upper-outer quadrant of right breast in female, estrogen receptor positive (HCC) 09/08/2019     THERAPY DIAG:  Acute pain of left knee  Stiffness of left knee, not elsewhere classified  Muscle weakness (generalized)  Difficulty in walking, not elsewhere classified  Localized edema   PCP: Sharlene Dory DO   REFERRING PROVIDER: Kathryne Hitch, MD   REFERRING DIAG: [phone] (ICD-10-CM) - Unilateral primary osteoarthritis, left knee     Rationale for Evaluation and Treatment Rehabilitation   ONSET DATE: 03/26/2022 Left TKA   SUBJECTIVE:    SUBJECTIVE STATEMENT: Pt stating she went to gym yesterday and worked with trainer and her whole body is "feeling it".    PERTINENT HISTORY: HTN, hx of breast cancer   PAIN:  Are you having pain? No pain upon arrival only tightness Pain location: lfet knee, calf Pain description: achy Aggravating factors: standing Relieving factors: resting, ice   PRECAUTIONS: Fall   WEIGHT BEARING RESTRICTIONS No   FALLS:  Has patient fallen in last 6 months? No   LIVING ENVIRONMENT: Lives with: lives with their family and lives with their spouse Lives in: House/apartment Stairs: No Has following equipment at home: Single point cane   OCCUPATION: substitute teacher   PLOF: Independent   PATIENT GOALS Get to teaching high school     OBJECTIVE:  PATIENT SURVEYS:  04/09/22: FOTO 61% (predicted 75%)   COGNITION:           04/09/22: Overall cognitive status: Within functional limits for tasks assessed                             SENSATION: 04/09/22: WFL   EDEMA:  04/09/22: Circumferential: left knee mid patella: 46 centimeters                                          Rt knee mid patella : 44 centimeters   MUSCLE LENGTH: 04/09/22: Hamstrings: Right 58 deg; Left 65 deg           POSTURE:  04/09/22: rounded shoulders, forward head,  increased lumbar lordosis, and increased thoracic kyphosis   PALPATION: 04/09/2022: mild tenderness around joint line and incision, bruising noted in lower leg (purple, redness, and yellow coloration)   LOWER EXTREMITY ROM:    ROM Right 04/09/22 Left 04/09/22 Left 04/16/22 Left 04/23/22 Left 04/29/22  Knee flexion A: 115 P: 118 A: 108 P: 110 A:110 P:120 A: 112 P: 116 A: 112 P: 115  Knee extension A: -20 P: -18 A: -16 P: -12 A:-5 A: -8 P: -5 A :-8 P: -5   (Blank rows = not tested)   LOWER EXTREMITY MMT:   MMT Right eval Left eval  Hip flexion 5/5 5/5  Hip extension      Hip abduction      Hip adduction      Hip internal rotation      Hip external rotation      Knee flexion 4/5 4-/5  Knee extension 5/5 4-/5  Ankle dorsiflexion      Ankle plantarflexion      Ankle inversion      Ankle eversion       (Blank rows = not tested)       FUNCTIONAL TESTS:  Eval: 5 times sit to stand: 12 seconds UE support 05/01/22:    GAIT: Distance walked: 30 feet  Assistive device utilized: Single point cane Level of assistance: Modified independence Comments: mild antalgic gait pattern       TODAY'S TREATMENT: 04/30/22 TherEx/Neuromuscular Re-edu:  Recumbent bike: seat at 5, Level 3 x 8 minutes Step ups onto 6 inch step leading with left X 15  Airex mat: alternating cone taps x 20 forward and then crossing x 20 Leg press DL 87# 2 x 15, Left LE only 50# 2 x 10 Vectors: sliding disc toward 3 cones x 10 with single UE support, x 10 without UE support TRX squats x 15  Fitter first round board: x 1 minute with UE support Rocker Board: x 1 minute with UE support Sit to stand x 10 Manual:  Left knee PROM with overpressure into extension to her tolerance Modalities:  Vasopnuematic device X 10 min  left knee, medium compression,  34 degrees  04/23/22 TherEx: Recumbent bike: seat at 5, Level 3 x 8 minutes gastroc and soleus stretch x 2 each holding 30 seconds on slant board Step  ups onto 6 inch step leading with left X 15 fwd and X 15 lateral Airex beam: walking forward x 6 c intermittent support,  Airex beam: tandem stance holding 30 seconds c finger tap support, tandem stance with head turns c UE support Leg press DL 75#  3 x 10, Left LE only 37# 3 x 10 Side stepping toward 3 cones in diagonal pattern performed x 5 in each direction Standing lunges reaching in diagonal pattern toward 2 cones placed behind pt, Pt stating lunging with her Rt knee was painful, so pt continued only with left LE x 10 Side stepping c mini squat Mini squats at TM bar x 15  Manual:  for left knee PROM with overpressure into extension to her tolerance Modalities:  Vasopnuematic device X 10 min to left knee, medium compression, X 10 min, 34 degrees  04/23/22 TherEx: Recumbent bike: seat at 5, Level 3 x 8 minutes gastroc and soleus stretch x 2 each holding 20 seconds on slant board Step ups onto 6 inch step leading with left X 10 fwd and X 10 lateral Standing on Airex: marching x 20, cone taps x 20 (2 cones set up at front corners of mat) Leg press DL 62# 2X15, Left LE only 25# 2X15  Seated SLR x 15 Mini squats at TM bar Manual:  for left knee PROM with overpressure into extension to her tolerance Modalities:  Vasopnuematic device X 10 min to left knee, low compression, X 10 min, lowest temp.        PATIENT EDUCATION:  Education details: PT POC, HEP Person educated: Patient Education method: Explanation, Demonstration, Tactile cues, Verbal cues, and Handouts Education comprehension: verbalized understanding and returned demonstration     HOME EXERCISE PROGRAM: Access Code: [email or web address] URL: [email or web address] Date: 04/09/2022 Prepared by: Kearney Hard   Exercises - Supine Bridge  - 3 x daily - 7 x weekly - 2 sets - 10 reps - Hooklying Hamstring Stretch with Strap  - 3 x daily - 7 x weekly - 3 reps - Long Sitting Quad Set with Towel Roll Under Heel  - 3 x  daily - 7 x weekly - 2 sets - 10 reps - 5 seocnds hold - Active Straight Leg Raise with Quad Set  - 3 x daily - 7 x weekly - 2 sets - 10 reps - Supine Heel Slide with Strap  - 3 x daily - 7 x weekly - 10 reps - 5-10 seconds hold - Sit to Stand  - 2 x daily - 7 x weekly - 2 sets - 10 reps   ASSESSMENT:   CLINICAL IMPRESSION: Pt had her first visit to the gym yesterday and pt arrived reporting entire body soreness. Pt progressing with strengthening activities and balance. Continue skilled PT.      OBJECTIVE IMPAIRMENTS decreased balance, decreased endurance, decreased mobility, difficulty walking, decreased ROM, decreased strength, increased edema, impaired flexibility, and pain.    ACTIVITY LIMITATIONS lifting, bending, sitting, standing, squatting, sleeping, stairs, and transfers   PARTICIPATION LIMITATIONS: cleaning, laundry, driving, and community activity   PERSONAL FACTORS : HTN, hx of breast cancer are also affecting patient's functional outcome.    REHAB POTENTIAL: Good   CLINICAL DECISION MAKING: Stable/uncomplicated   EVALUATION COMPLEXITY: Low     GOALS: Goals reviewed with patient? Yes   SHORT TERM GOALS: Target date: 05/07/2022    Patient will demonstrate independent use of initial home exercise program to maintain progress from in clinic treatments. Goal status: MET 04/23/22   Long term PT goals (target dates for all long term goals are 8 weeks  06/07/2022) Patient will demonstrate/report pain at worst less than or equal to 2/10 to facilitate minimal limitation in daily activity secondary to pain symptoms. Goal status: MET 04/29/22   Patient will  demonstrate independent use of home exercise program to facilitate ability to maintain/progress functional gains from skilled physical therapy services. Goal status: New   Patient will demonstrate FOTO outcome > or = 75 % to indicate reduced disability due to condition. Goal status: New   Pt will improve her Left knee active  arc of motion from 5-120 degrees. Goal status: On-going 04/29/22       5.  Pt will be able to navigate stairs with single rail with step over step gait pattern.   Goal status: New     PLAN: PT FREQUENCY: 2-3 x/ week   PT DURATION: 8 weeks to 06/07/2022   PLANNED INTERVENTIONS: Therapeutic exercises, Therapeutic activity, Neuromuscular re-education, Balance training, Gait training, Patient/Family education, Joint mobilization, Stair training, Dry Needling, Electrical stimulation, Cryotherapy, Moist heat, Taping, Ultrasound, and Manual therapy   PLAN FOR NEXT SESSION: Try stairs in lobby next session. Knee extension ROM, strengthening, dynamic gait/balance activity, vaso if desired.    Oretha Caprice, PT, MPT 05/01/2022, 8:09 AM

## 2022-05-03 ENCOUNTER — Encounter: Payer: Medicare HMO | Admitting: Physical Therapy

## 2022-05-06 ENCOUNTER — Encounter: Payer: Self-pay | Admitting: Physical Therapy

## 2022-05-06 ENCOUNTER — Ambulatory Visit (INDEPENDENT_AMBULATORY_CARE_PROVIDER_SITE_OTHER): Payer: Medicare HMO | Admitting: Physical Therapy

## 2022-05-06 DIAGNOSIS — M6281 Muscle weakness (generalized): Secondary | ICD-10-CM | POA: Diagnosis not present

## 2022-05-06 DIAGNOSIS — R600 Localized edema: Secondary | ICD-10-CM

## 2022-05-06 DIAGNOSIS — R262 Difficulty in walking, not elsewhere classified: Secondary | ICD-10-CM

## 2022-05-06 DIAGNOSIS — M25662 Stiffness of left knee, not elsewhere classified: Secondary | ICD-10-CM | POA: Diagnosis not present

## 2022-05-06 DIAGNOSIS — M25562 Pain in left knee: Secondary | ICD-10-CM

## 2022-05-06 NOTE — Therapy (Signed)
OUTPATIENT PHYSICAL THERAPY TREATMENT NOTE   Patient Name: Kelly Dixon MRN: [id number] DOB:June 22, 1946, 76 y.o., female Today's Date: 05/06/2022   END OF SESSION:   PT End of Session - 05/06/22 0905     Visit Number 7    Number of Visits 24    Date for PT Re-Evaluation 06/07/22    Authorization Type [employer]    Progress Note Due on Visit 10    PT Start Time 0800    PT Stop Time 0850    PT Time Calculation (min) 50 min    Activity Tolerance Patient tolerated treatment well    Behavior During Therapy [facility] for tasks assessed/performed                Past Medical History:  Diagnosis Date   Anemia    Arthritis    Breast cancer ([place]) 2020   right breast   Gallstones 05/31/2018   GERD (gastroesophageal reflux disease)    Hypertension    Personal history of radiation therapy    PONV (postoperative nausea and vomiting)    Syphilis    Past Surgical History:  Procedure Laterality Date   BREAST LUMPECTOMY WITH RADIOACTIVE SEED AND SENTINEL LYMPH NODE BIOPSY Right 09/15/2019   Procedure: RIGHT BREAST LUMPECTOMY WITH RADIOACTIVE SEED AND SENTINEL LYMPH NODE BIOPSY;  Surgeon: Donnie Mesa, MD;  Location: [place];  Service: General;  Laterality: Right;   CHOLECYSTECTOMY N/A 04/13/2019   Procedure: LAPAROSCOPIC CHOLECYSTECTOMY WITH INTRAOPERATIVE CHOLANGIOGRAM;  Surgeon: Donnie Mesa, MD;  Location: [place];  Service: General;  Laterality: N/A;   DILATION AND CURETTAGE OF UTERUS     TOTAL KNEE ARTHROPLASTY Left 03/26/2022   Procedure: LEFT TOTAL KNEE ARTHROPLASTY;  Surgeon: Mcarthur Rossetti, MD;  Location: [place];  Service: Orthopedics;  Laterality: Left;   Patient Active Problem List   Diagnosis Date Noted   Status post total left knee replacement 03/26/2022   Unilateral primary osteoarthritis, left knee 01/22/2022   Unilateral primary osteoarthritis, right knee 01/22/2022   Bilateral primary osteoarthritis of knee 01/09/2022   Chronic pain of both  knees 10/09/2020   Essential hypertension 09/04/2020   Malignant neoplasm of upper-outer quadrant of right breast in female, estrogen receptor positive ([place]) 09/08/2019     THERAPY DIAG:  Acute pain of left knee  Stiffness of left knee, not elsewhere classified  Muscle weakness (generalized)  Difficulty in walking, not elsewhere classified  Localized edema   PCP: Shelda Pal DO   REFERRING PROVIDER: Mcarthur Rossetti, MD   REFERRING DIAG: [phone] (ICD-10-CM) - Unilateral primary osteoarthritis, left knee     Rationale for Evaluation and Treatment Rehabilitation   ONSET DATE: 03/26/2022 Left TKA   SUBJECTIVE:    SUBJECTIVE STATEMENT: No pain reported this visit. Pt working on her knee flexion at home with HEP. Pt stating she has been elevating more on her couch with ice.    PERTINENT HISTORY: HTN, hx of breast cancer   PAIN:  Are you having pain? No pain upon arrival only tightness Pain location: lfet knee, calf Pain description: achy Aggravating factors: standing Relieving factors: resting, ice   PRECAUTIONS: Fall   WEIGHT BEARING RESTRICTIONS No   FALLS:  Has patient fallen in last 6 months? No   LIVING ENVIRONMENT: Lives with: lives with their family and lives with their spouse Lives in: House/apartment Stairs: No Has following equipment at home: Single point cane   OCCUPATION: substitute teacher   PLOF: Independent   PATIENT GOALS Get  to teaching high school     OBJECTIVE:      PATIENT SURVEYS:  04/09/22: FOTO 61% (predicted 75%)   COGNITION:           04/09/22: Overall cognitive status: Within functional limits for tasks assessed                             SENSATION: 04/09/22: WFL   EDEMA:  04/09/22: Circumferential: left knee mid patella: 46 centimeters                                          Rt knee mid patella : 44 centimeters   MUSCLE LENGTH: 04/09/22: Hamstrings: Right 58 deg; Left 65 deg           POSTURE:   04/09/22: rounded shoulders, forward head, increased lumbar lordosis, and increased thoracic kyphosis   PALPATION: 04/09/2022: mild tenderness around joint line and incision, bruising noted in lower leg (purple, redness, and yellow coloration)   LOWER EXTREMITY ROM:    ROM Right 04/09/22 Left 04/09/22 Left 04/16/22 Left 04/23/22 Left 04/29/22 Left 05/06/22  Knee flexion A: 115 P: 118 A: 108 P: 110 A:110 P:120 A: 112 P: 116 A: 112 P: 115 A: 120  Knee extension A: -20 P: -18 A: -16 P: -12 A:-5 A: -8 P: -5 A :-8 P: -5 A: -6 P: -5   (Blank rows = not tested)   LOWER EXTREMITY MMT:   MMT Right eval Left eval  Hip flexion 5/5 5/5  Hip extension      Hip abduction      Hip adduction      Hip internal rotation      Hip external rotation      Knee flexion 4/5 4-/5  Knee extension 5/5 4-/5  Ankle dorsiflexion      Ankle plantarflexion      Ankle inversion      Ankle eversion       (Blank rows = not tested)       FUNCTIONAL TESTS:  Eval: 5 times sit to stand: 12 seconds UE support 05/01/22:    GAIT: Distance walked: 30 feet  Assistive device utilized: Single point cane Level of assistance: Modified independence Comments: mild antalgic gait pattern       TODAY'S TREATMENT: 05/06/22 TherEx/Neuromuscular Re-edu:  NuStep Level 5 x 8 minutes Side stepping over 3 cones c close supervision x 6 each direction Airex mat: alternating cone taps x 20 forward and then crossing x 20 Leg press DL 100# 2 x 15, Left LE only 75# 2 x 10, 37# calf raises Left LE  Squats on incline x 15 SLS on Airex: c close supervision for balance Fitter first round board: x 1 minute with UE support Rocker Board: x 1 minute with UE support Therapeutic Activities:  Up and down 1 flight of stairs with single rail with step to and progressing to step through going down and step over step ascending.  Modalities:  Vasopnuematic device X 10 min  left knee, medium compression,  34  degrees  04/30/22 TherEx/Neuromuscular Re-edu:  Recumbent bike: seat at 5, Level 3 x 8 minutes Step ups onto 6 inch step leading with left X 15  Airex mat: alternating cone taps x 20 forward and then crossing x 20 Leg press DL 87# 2 x 15, Left  LE only 50# 2 x 10 Vectors: sliding disc toward 3 cones x 10 with single UE support, x 10 without UE support TRX squats x 15  Fitter first round board: x 1 minute with UE support Rocker Board: x 1 minute with UE support Sit to stand x 10 Manual:  Left knee PROM with overpressure into extension to her tolerance Modalities:  Vasopnuematic device X 10 min  left knee, medium compression,  34 degrees  04/23/22 TherEx: Recumbent bike: seat at 5, Level 3 x 8 minutes gastroc and soleus stretch x 2 each holding 30 seconds on slant board Step ups onto 6 inch step leading with left X 15 fwd and X 15 lateral Airex beam: walking forward x 6 c intermittent support,  Airex beam: tandem stance holding 30 seconds c finger tap support, tandem stance with head turns c UE support Leg press DL 75# 3 x 10, Left LE only 37# 3 x 10 Side stepping toward 3 cones in diagonal pattern performed x 5 in each direction Standing lunges reaching in diagonal pattern toward 2 cones placed behind pt, Pt stating lunging with her Rt knee was painful, so pt continued only with left LE x 10 Side stepping c mini squat Mini squats at TM bar x 15  Manual:  for left knee PROM with overpressure into extension to her tolerance Modalities:  Vasopnuematic device X 10 min to left knee, medium compression, X 10 min, 34 degrees  04/23/22 TherEx: Recumbent bike: seat at 5, Level 3 x 8 minutes gastroc and soleus stretch x 2 each holding 20 seconds on slant board Step ups onto 6 inch step leading with left X 10 fwd and X 10 lateral Standing on Airex: marching x 20, cone taps x 20 (2 cones set up at front corners of mat) Leg press DL 62# 2X15, Left LE only 25# 2X15  Seated SLR x 15 Mini squats  at TM bar Manual:  for left knee PROM with overpressure into extension to her tolerance Modalities:  Vasopnuematic device X 10 min to left knee, low compression, X 10 min, lowest temp.        PATIENT EDUCATION:  Education details: PT POC, HEP Person educated: Patient Education method: Explanation, Demonstration, Tactile cues, Verbal cues, and Handouts Education comprehension: verbalized understanding and returned demonstration     HOME EXERCISE PROGRAM: Access Code: [email or web address] URL: [email or web address] Date: 04/09/2022 Prepared by: Kearney Hard   Exercises - Supine Bridge  - 3 x daily - 7 x weekly - 2 sets - 10 reps - Hooklying Hamstring Stretch with Strap  - 3 x daily - 7 x weekly - 3 reps - Long Sitting Quad Set with Towel Roll Under Heel  - 3 x daily - 7 x weekly - 2 sets - 10 reps - 5 seocnds hold - Active Straight Leg Raise with Quad Set  - 3 x daily - 7 x weekly - 2 sets - 10 reps - Supine Heel Slide with Strap  - 3 x daily - 7 x weekly - 10 reps - 5-10 seconds hold - Sit to Stand  - 2 x daily - 7 x weekly - 2 sets - 10 reps   ASSESSMENT:   CLINICAL IMPRESSION: Pt reporting no pain in her left knee. Pt stating she is still working hard at home on her flexion. Pt has a follow up MD tomorrow and pt is hoping to get released for swimming. Pt has improved her left knee flexion to 120 degrees.  Pt still lacking 5 degrees of extension. Continue skilled PT to progress toward LTG's.      OBJECTIVE IMPAIRMENTS decreased balance, decreased endurance, decreased mobility, difficulty walking, decreased ROM, decreased strength, increased edema, impaired flexibility, and pain.    ACTIVITY LIMITATIONS lifting, bending, sitting, standing, squatting, sleeping, stairs, and transfers   PARTICIPATION LIMITATIONS: cleaning, laundry, driving, and community activity   PERSONAL FACTORS : HTN, hx of breast cancer are also affecting patient's functional outcome.    REHAB  POTENTIAL: Good   CLINICAL DECISION MAKING: Stable/uncomplicated   EVALUATION COMPLEXITY: Low     GOALS: Goals reviewed with patient? Yes   SHORT TERM GOALS: Target date: 05/07/2022    Patient will demonstrate independent use of initial home exercise program to maintain progress from in clinic treatments. Goal status: MET 04/23/22   Long term PT goals (target dates for all long term goals are 8 weeks  06/07/2022) Patient will demonstrate/report pain at worst less than or equal to 2/10 to facilitate minimal limitation in daily activity secondary to pain symptoms. Goal status: MET 04/29/22   Patient will demonstrate independent use of home exercise program to facilitate ability to maintain/progress functional gains from skilled physical therapy services. Goal status: New   Patient will demonstrate FOTO outcome > or = 75 % to indicate reduced disability due to condition. Goal status: New   Pt will improve her Left knee active arc of motion from 5-120 degrees. Goal status: On-going 04/29/22       5.  Pt will be able to navigate stairs with single rail with step over step gait pattern.   Goal status: New     PLAN: PT FREQUENCY: 2-3 x/ week   PT DURATION: 8 weeks to 06/07/2022   PLANNED INTERVENTIONS: Therapeutic exercises, Therapeutic activity, Neuromuscular re-education, Balance training, Gait training, Patient/Family education, Joint mobilization, Stair training, Dry Needling, Electrical stimulation, Cryotherapy, Moist heat, Taping, Ultrasound, and Manual therapy   PLAN FOR NEXT SESSION: Try stairs in lobby next session. Knee extension ROM, strengthening, dynamic gait/balance activity, vaso if desired.    Oretha Caprice, PT, MPT 05/06/2022, 9:08 AM

## 2022-05-07 ENCOUNTER — Encounter: Payer: Self-pay | Admitting: Physician Assistant

## 2022-05-07 ENCOUNTER — Telehealth: Payer: Self-pay | Admitting: *Deleted

## 2022-05-07 ENCOUNTER — Ambulatory Visit (INDEPENDENT_AMBULATORY_CARE_PROVIDER_SITE_OTHER): Payer: Medicare HMO | Admitting: Physician Assistant

## 2022-05-07 DIAGNOSIS — Z96652 Presence of left artificial knee joint: Secondary | ICD-10-CM

## 2022-05-07 NOTE — Progress Notes (Signed)
HPI: [name] returns today status post left total knee arthroplasty 03/26/2022.  She states she is overall doing well.  She reports therapy is measured 0 to 120 degrees of flexion.  She is taking no pain medications muscle relaxants.  She states she has 0 out of 10 pain in the knee.  Patient does note some heaviness whenever she goes to bed at night in the knee but she describes this as absolutely no pain.  Physical exam: General well-developed well-nourished female who ambulates without any assistive device. Left knee full extension flexion to 115 degrees.  No instability valgus varus stressing.  Surgical incisions well-healed.  No signs of infection.  Left calf supple nontender.  Impression: Status post left total knee arthroplasty  Plan: She will continue to work on range of motion and strengthening of the knee.  Follow-up with [place] in 4 months we will obtain AP and lateral view of the knee at that time.  She will follow-up sooner if there is any questions or concerns.  Questions were encouraged and answered

## 2022-05-07 NOTE — Telephone Encounter (Signed)
Ortho bundle in person meeting and survey completed.

## 2022-05-08 ENCOUNTER — Encounter: Payer: Self-pay | Admitting: Physical Therapy

## 2022-05-08 ENCOUNTER — Ambulatory Visit (INDEPENDENT_AMBULATORY_CARE_PROVIDER_SITE_OTHER): Payer: Medicare HMO | Admitting: Physical Therapy

## 2022-05-08 DIAGNOSIS — R600 Localized edema: Secondary | ICD-10-CM | POA: Diagnosis not present

## 2022-05-08 DIAGNOSIS — M25662 Stiffness of left knee, not elsewhere classified: Secondary | ICD-10-CM | POA: Diagnosis not present

## 2022-05-08 DIAGNOSIS — M25562 Pain in left knee: Secondary | ICD-10-CM

## 2022-05-08 DIAGNOSIS — M6281 Muscle weakness (generalized): Secondary | ICD-10-CM

## 2022-05-08 DIAGNOSIS — R262 Difficulty in walking, not elsewhere classified: Secondary | ICD-10-CM

## 2022-05-08 NOTE — Therapy (Signed)
OUTPATIENT PHYSICAL THERAPY TREATMENT NOTE   Patient Name: Kelly Dixon MRN: [id number] DOB:25-Nov-1945, 77 y.o., female Today's Date: 05/08/2022   END OF SESSION:   PT End of Session - 05/08/22 0805     Visit Number 8    Number of Visits 24    Date for PT Re-Evaluation 06/07/22    Authorization Type [employer]    Progress Note Due on Visit 10    PT Start Time 0800    PT Stop Time 0845    PT Time Calculation (min) 45 min    Activity Tolerance Patient tolerated treatment well    Behavior During Therapy [facility] for tasks assessed/performed                 Past Medical History:  Diagnosis Date   Anemia    Arthritis    Breast cancer ([place]) 2020   right breast   Gallstones 05/31/2018   GERD (gastroesophageal reflux disease)    Hypertension    Personal history of radiation therapy    PONV (postoperative nausea and vomiting)    Syphilis    Past Surgical History:  Procedure Laterality Date   BREAST LUMPECTOMY WITH RADIOACTIVE SEED AND SENTINEL LYMPH NODE BIOPSY Right 09/15/2019   Procedure: RIGHT BREAST LUMPECTOMY WITH RADIOACTIVE SEED AND SENTINEL LYMPH NODE BIOPSY;  Surgeon: Donnie Mesa, MD;  Location: [place];  Service: General;  Laterality: Right;   CHOLECYSTECTOMY N/A 04/13/2019   Procedure: LAPAROSCOPIC CHOLECYSTECTOMY WITH INTRAOPERATIVE CHOLANGIOGRAM;  Surgeon: Donnie Mesa, MD;  Location: [place];  Service: General;  Laterality: N/A;   DILATION AND CURETTAGE OF UTERUS     TOTAL KNEE ARTHROPLASTY Left 03/26/2022   Procedure: LEFT TOTAL KNEE ARTHROPLASTY;  Surgeon: Mcarthur Rossetti, MD;  Location: [place];  Service: Orthopedics;  Laterality: Left;   Patient Active Problem List   Diagnosis Date Noted   Status post total left knee replacement 03/26/2022   Unilateral primary osteoarthritis, left knee 01/22/2022   Unilateral primary osteoarthritis, right knee 01/22/2022   Bilateral primary osteoarthritis of knee 01/09/2022   Chronic pain of both  knees 10/09/2020   Essential hypertension 09/04/2020   Malignant neoplasm of upper-outer quadrant of right breast in female, estrogen receptor positive ([place]) 09/08/2019     THERAPY DIAG:  Acute pain of left knee  Stiffness of left knee, not elsewhere classified  Muscle weakness (generalized)  Localized edema  Difficulty in walking, not elsewhere classified   PCP: Shelda Pal DO   REFERRING PROVIDER: Mcarthur Rossetti, MD   REFERRING DIAG: [phone] (ICD-10-CM) - Unilateral primary osteoarthritis, left knee     Rationale for Evaluation and Treatment Rehabilitation   ONSET DATE: 03/26/2022 Left TKA   SUBJECTIVE:    SUBJECTIVE STATEMENT: No pain reported this visit. Pt working on her knee flexion at home with HEP. Pt stating she has been elevating more on her couch with ice.    PERTINENT HISTORY: HTN, hx of breast cancer   PAIN:  Are you having pain? No pain upon arrival only tightness Pain location: lfet knee, calf Pain description: achy Aggravating factors: standing Relieving factors: resting, ice   PRECAUTIONS: Fall   WEIGHT BEARING RESTRICTIONS No   FALLS:  Has patient fallen in last 6 months? No   LIVING ENVIRONMENT: Lives with: lives with their family and lives with their spouse Lives in: House/apartment Stairs: No Has following equipment at home: Single point cane   OCCUPATION: substitute teacher   PLOF: Independent   PATIENT GOALS  Get to teaching high school     OBJECTIVE:      PATIENT SURVEYS:  04/09/22: FOTO 61% (predicted 75%) 05/08/22: FOTO  69%     COGNITION:           04/09/22: Overall cognitive status: Within functional limits for tasks assessed                             SENSATION: 04/09/22: WFL   EDEMA:  04/09/22: Circumferential: left knee mid patella: 46 centimeters                                          Rt knee mid patella : 44 centimeters   MUSCLE LENGTH: 04/09/22: Hamstrings: Right 58 deg; Left 65 deg            POSTURE:  04/09/22: rounded shoulders, forward head, increased lumbar lordosis, and increased thoracic kyphosis   PALPATION: 04/09/2022: mild tenderness around joint line and incision, bruising noted in lower leg (purple, redness, and yellow coloration)   LOWER EXTREMITY ROM:    ROM Right 04/09/22 Left 04/09/22 Left 04/16/22 Left 04/23/22 Left 04/29/22 Left 05/06/22 Left 05/08/22  Knee flexion A: 115 P: 118 A: 108 P: 110 A:110 P:120 A: 112 P: 116 A: 112 P: 115 A: 120 A: 120  Knee extension A: -20 P: -18 A: -16 P: -12 A:-5 A: -8 P: -5 A :-8 P: -5 A: -6 P: -5 A: -5   (Blank rows = not tested)   LOWER EXTREMITY MMT:   MMT Right eval Left eval Left 05/08/22  Hip flexion 5/5 5/5 5/5  Hip extension       Hip abduction       Hip adduction       Hip internal rotation       Hip external rotation       Knee flexion 4/5 4-/5 5/5  Knee extension 5/5 4-/5 5/5  Ankle dorsiflexion       Ankle plantarflexion       Ankle inversion       Ankle eversion        (Blank rows = not tested)       FUNCTIONAL TESTS:  Eval: 5 times sit to stand: 12 seconds UE support  05/08/22: 10 seconds no UE support   GAIT: Distance walked: 30 feet  Assistive device utilized: Single point cane Level of assistance: Modified independence Comments: mild antalgic gait pattern       TODAY'S TREATMENT: 05/08/22 TherEx/Neuromuscular Re-edu:  Side stepping over 3 cones c close supervision x 6 each direction Stepping forward of 6 cones each direction x 2 Rocker Board: x 1 minute with UE support Recumbent bike: Level 4 x 8 minutes Sit to stand no UE support Therapeutic Activities:  Up and down 1 flight of stairs with single rail with step to and progressing to step through going down and step over step ascending.  Modalities:  Vasopnuematic device X 10 min  left knee, medium compression,  34 degrees   05/06/22 TherEx/Neuromuscular Re-edu:  NuStep Level 5 x 8 minutes Side stepping over 3 cones c  close supervision x 6 each direction Airex mat: alternating cone taps x 20 forward and then crossing x 20 Leg press DL 100# 2 x 15, Left LE only 75# 2 x 10, 37# calf raises Left LE  Squats on incline x 15 SLS on Airex: c close supervision for balance Fitter first round board: x 1 minute with UE support Rocker Board: x 1 minute with UE support Therapeutic Activities:  Up and down 1 flight of stairs with single rail with step to and progressing to step through going down and step over step ascending.  Modalities:  Vasopnuematic device X 10 min  left knee, medium compression,  34 degrees  04/30/22 TherEx/Neuromuscular Re-edu:  Recumbent bike: seat at 5, Level 3 x 8 minutes Step ups onto 6 inch step leading with left X 15  Airex mat: alternating cone taps x 20 forward and then crossing x 20 Leg press DL 87# 2 x 15, Left LE only 50# 2 x 10 Vectors: sliding disc toward 3 cones x 10 with single UE support, x 10 without UE support TRX squats x 15  Fitter first round board: x 1 minute with UE support Rocker Board: x 1 minute with UE support Sit to stand x 10 Manual:  Left knee PROM with overpressure into extension to her tolerance Modalities:  Vasopnuematic device X 10 min  left knee, medium compression,  34 degrees         PATIENT EDUCATION:  Education details: updated HEP Person educated: Patient Education method: Explanation, Demonstration, Tactile cues, Verbal cues, and Handouts Education comprehension: verbalized understanding and returned demonstration     HOME EXERCISE PROGRAM: Access Code: [email or web address] URL: [email or web address] Date: 05/08/2022 Prepared by: Kearney Hard  Exercises - Supine Bridge  - 3 x daily - 7 x weekly - 2 sets - 10 reps - Hooklying Hamstring Stretch with Strap  - 3 x daily - 7 x weekly - 3 reps - Long Sitting Quad Set with Towel Roll Under Heel  - 3 x daily - 7 x weekly - 2 sets - 10 reps - 5 seocnds hold - Active Straight Leg Raise  with Quad Set  - 3 x daily - 7 x weekly - 2 sets - 10 reps - Supine Heel Slide with Strap  - 3 x daily - 7 x weekly - 10 reps - 5-10 seconds hold - Sit to Stand  - 2 x daily - 7 x weekly - 2 sets - 10 reps - Standing Hip Abduction with Counter Support  - 1 x daily - 7 x weekly - 2 sets - 10 reps - Standing Hip Extension with Counter Support  - 1 x daily - 7 x weekly - 2 sets - 10 reps - Standing Heel Raise with Support  - 1 x daily - 7 x weekly - 2 sets - 10 reps - Standing Hip Flexor Stretch  - 1 x daily - 7 x weekly - 3 reps - 15-20 seconds hold   ASSESSMENT:   CLINICAL IMPRESSION: Pt is currently working with personal trainer at her local gym and has made excellent progress with PT. Pt has met  4 out of 5 LTG's set at her initial evaluation. Pt's Left knee strength has improved to 5/5 and her overall active arc of motion is 5-120 degrees. Pt is being discharged from skilled PT.      OBJECTIVE IMPAIRMENTS decreased balance, decreased endurance, decreased mobility, difficulty walking, decreased ROM, decreased strength, increased edema, impaired flexibility, and pain.    ACTIVITY LIMITATIONS lifting, bending, sitting, standing, squatting, sleeping, stairs, and transfers   PARTICIPATION LIMITATIONS: cleaning, laundry, driving, and community activity   PERSONAL FACTORS : HTN, hx of breast cancer are also affecting patient's functional  outcome.    REHAB POTENTIAL: Good   CLINICAL DECISION MAKING: Stable/uncomplicated   EVALUATION COMPLEXITY: Low     GOALS: Goals reviewed with patient? Yes   SHORT TERM GOALS: Target date: 05/07/2022    Patient will demonstrate independent use of initial home exercise program to maintain progress from in clinic treatments. Goal status: MET 04/23/22   Long term PT goals (target dates for all long term goals are 8 weeks  06/07/2022) Patient will demonstrate/report pain at worst less than or equal to 2/10 to facilitate minimal limitation in daily activity  secondary to pain symptoms. Goal status: MET 04/29/22   Patient will demonstrate independent use of home exercise program to facilitate ability to maintain/progress functional gains from skilled physical therapy services. Goal status: MET 05/08/22   Patient will demonstrate FOTO outcome > or = 75 % to indicate reduced disability due to condition. Goal status: Not Met 05/08/22   Pt will improve her Left knee active arc of motion from 5-120 degrees. Goal status: MET 05/08/22       5.  Pt will be able to navigate stairs with single rail with step over step gait pattern.   Goal status: MET 05/08/22     PLAN: PT FREQUENCY: 2-3 x/ week   PT DURATION: 8 weeks to 06/07/2022   PLANNED INTERVENTIONS: Therapeutic exercises, Therapeutic activity, Neuromuscular re-education, Balance training, Gait training, Patient/Family education, Joint mobilization, Stair training, Dry Needling, Electrical stimulation, Cryotherapy, Moist heat, Taping, Ultrasound, and Manual therapy   PLAN FOR NEXT SESSION: Pt discharged   Oretha Caprice, PT, MPT 05/08/2022, 8:44 AM   PHYSICAL THERAPY DISCHARGE SUMMARY  Visits from Start of Care: 7  Current functional level related to goals / functional outcomes: See above   Remaining deficits: See above   Education / Equipment: HEP updated, self STM    Patient agrees to discharge. Patient goals were partially met. Patient is being discharged due to being pleased with the current functional level.

## 2022-05-10 ENCOUNTER — Encounter: Payer: Self-pay | Admitting: Family Medicine

## 2022-05-10 ENCOUNTER — Ambulatory Visit (INDEPENDENT_AMBULATORY_CARE_PROVIDER_SITE_OTHER): Payer: Medicare HMO | Admitting: Family Medicine

## 2022-05-10 VITALS — BP 110/68 | HR 86 | Temp 98.1°F | Ht 62.0 in | Wt 196.4 lb

## 2022-05-10 DIAGNOSIS — R748 Abnormal levels of other serum enzymes: Secondary | ICD-10-CM

## 2022-05-10 DIAGNOSIS — Z23 Encounter for immunization: Secondary | ICD-10-CM

## 2022-05-10 DIAGNOSIS — Z0000 Encounter for general adult medical examination without abnormal findings: Secondary | ICD-10-CM | POA: Diagnosis not present

## 2022-05-10 DIAGNOSIS — I10 Essential (primary) hypertension: Secondary | ICD-10-CM

## 2022-05-10 LAB — LIPID PANEL
Cholesterol: 174 mg/dL (ref 0–200)
HDL: 56.2 mg/dL (ref >39.00)
LDL Cholesterol: 94 mg/dL (ref 0–99)
NonHDL: 117.94
Total CHOL/HDL Ratio: 3
Triglycerides: 118 mg/dL (ref 0.0–149.0)
VLDL: 23.6 mg/dL (ref 0.0–40.0)

## 2022-05-10 LAB — CBC
HCT: 36.1 % (ref 36.0–46.0)
Hemoglobin: 11.5 g/dL — ABNORMAL LOW (ref 12.0–15.0)
MCHC: 32 g/dL (ref 30.0–36.0)
MCV: 94.4 fl (ref 78.0–100.0)
Platelets: 227 10*3/uL (ref 150.0–400.0)
RBC: 3.82 Mil/uL — ABNORMAL LOW (ref 3.87–5.11)
RDW: 13.5 % (ref 11.5–15.5)
WBC: 4.9 10*3/uL (ref 4.0–10.5)

## 2022-05-10 LAB — IBC + FERRITIN
Ferritin: 21.9 ng/mL (ref 10.0–291.0)
Iron: 51 ug/dL (ref 42–145)
Saturation Ratios: 12.7 % — ABNORMAL LOW (ref 20.0–50.0)
TIBC: 401.8 ug/dL (ref 250.0–450.0)
Transferrin: 287 mg/dL (ref 212.0–360.0)

## 2022-05-10 LAB — COMPREHENSIVE METABOLIC PANEL
ALT: 7 U/L (ref 0–35)
AST: 11 U/L (ref 0–37)
Albumin: 4.3 g/dL (ref 3.5–5.2)
Alkaline Phosphatase: 68 U/L (ref 39–117)
BUN: 13 mg/dL (ref 6–23)
CO2: 26 mEq/L (ref 19–32)
Calcium: 9 mg/dL (ref 8.4–10.5)
Chloride: 107 mEq/L (ref 96–112)
Creatinine, Ser: 0.67 mg/dL (ref 0.40–1.20)
GFR: 86.49 mL/min (ref >60.00)
Glucose, Bld: 103 mg/dL — ABNORMAL HIGH (ref 70–99)
Potassium: 3.9 mEq/L (ref 3.5–5.1)
Sodium: 141 mEq/L (ref 135–145)
Total Bilirubin: 0.4 mg/dL (ref 0.2–1.2)
Total Protein: 6.7 g/dL (ref 6.0–8.3)

## 2022-05-10 NOTE — Patient Instructions (Addendum)
Give [place] 2-3 business days to get the results of your labs back.   Keep the diet clean and stay active.  Please get me a copy of your advanced directive form at your convenience.   The Shingrix vaccine (for shingles) is a 2 shot series spaced 2-6 months apart. It can make people feel low energy, achy and almost like they have the flu for 48 hours after injection. 1/5 people can have nausea and/or vomiting. Please plan accordingly when deciding on when to get this shot. Call your pharmacy to get this. The second shot of the series is less severe regarding the side effects, but it still lasts 48 hours.   I recommend getting the flu shot in mid October. This suggestion would change if the CDC comes out with a different recommendation.   Let us know if you need anything.

## 2022-05-10 NOTE — Progress Notes (Signed)
Chief Complaint  Patient presents with   Annual Exam    Diverticulitis--discuss getting labs.     Well Woman Kelly Dixon is here for a complete physical.   Her last physical was >1 year ago.  Current diet: in general, a "healthy" diet. Current exercise: PT tx for L knee; lifting wts, swimming. Weight is starting to go down and she denies daytime fatigue. Seatbelt? Yes Advanced directive? Yes  Health Maintenance CCS- Yes Shingrix- No DEXA- Yes Mammogram- Yes Tetanus- Yes Pneumonia- Due for PCV20 Hep C screen- Yes  Past Medical History:  Diagnosis Date   Anemia    Arthritis    Breast cancer ([place]) 2020   right breast   Gallstones 05/31/2018   GERD (gastroesophageal reflux disease)    Hypertension    Personal history of radiation therapy    PONV (postoperative nausea and vomiting)    Syphilis      Past Surgical History:  Procedure Laterality Date   BREAST LUMPECTOMY WITH RADIOACTIVE SEED AND SENTINEL LYMPH NODE BIOPSY Right 09/15/2019   Procedure: RIGHT BREAST LUMPECTOMY WITH RADIOACTIVE SEED AND SENTINEL LYMPH NODE BIOPSY;  Surgeon: Donnie Mesa, MD;  Location: [place];  Service: General;  Laterality: Right;   CHOLECYSTECTOMY N/A 04/13/2019   Procedure: LAPAROSCOPIC CHOLECYSTECTOMY WITH INTRAOPERATIVE CHOLANGIOGRAM;  Surgeon: Donnie Mesa, MD;  Location: [place];  Service: General;  Laterality: N/A;   DILATION AND CURETTAGE OF UTERUS     TOTAL KNEE ARTHROPLASTY Left 03/26/2022   Procedure: LEFT TOTAL KNEE ARTHROPLASTY;  Surgeon: Mcarthur Rossetti, MD;  Location: [place];  Service: Orthopedics;  Laterality: Left;    Medications  Current Outpatient Medications on File Prior to Visit  Medication Sig Dispense Refill   acetaminophen (TYLENOL) 500 MG tablet Take 1,000 mg by mouth every 6 (six) hours as needed for moderate pain.     amLODipine (NORVASC) 5 MG tablet TAKE 1 TABLET(5 MG) BY MOUTH DAILY 30 tablet 3   fexofenadine (ALLEGRA) 180 MG  tablet Take 180 mg by mouth daily.     Allergies No Known Allergies  Review of Systems: Constitutional:  no fevers Eye:  no recent significant change in vision Ears:  +steady hearing loss Nose/Mouth/Throat:  no complaints of nasal congestion, no sore throat Cardiovascular: no chest pain Respiratory:  No shortness of breath Gastrointestinal:  No change in bowel habits GU:  Female: negative for dysuria Integumentary:  no abnormal skin lesions reported Neurologic:  no headaches Endocrine:  denies unexplained weight changes  Exam BP 110/68   Pulse 86   Temp 98.1 F (36.7 C) (Oral)   Ht '5\' 2"'$  (1.575 m)   Wt 196 lb 6 oz (89.1 kg)   SpO2 95%   BMI 35.92 kg/m  General:  well developed, well nourished, in no apparent distress Skin:  no significant moles, warts, or growths Head:  no masses, lesions, or tenderness Eyes:  pupils equal and round, sclera anicteric without injection Ears:  HOH. Canals 90% obstructed w cerumen, TMs shiny without retraction, no obvious effusion, no erythema Nose:  nares patent, septum midline, mucosa normal, and no drainage or sinus tenderness Throat/Pharynx:  lips and gingiva without lesion; tongue and uvula midline; non-inflamed pharynx; no exudates or postnasal drainage Neck: neck supple without adenopathy, thyromegaly, or masses Lungs:  clear to auscultation, breath sounds equal bilaterally, no respiratory distress Cardio:  regular rate and rhythm, no bruits or LE edema Abdomen:  abdomen soft, nontender; bowel sounds normal; no masses or organomegaly Genital: Deferred  Neuro:  gait cautious; deep tendon reflexes normal and symmetric Psych: well oriented with normal range of affect and appropriate judgment/insight  Assessment and Plan  Well adult exam  Essential hypertension - Plan: Comprehensive metabolic panel, CBC, IBC + Ferritin  Low serum HDL - Plan: Lipid panel   Well 76 y.o. female. Counseled on diet and exercise. Advanced directive form  requested today.  Shingrix rec'd.  Other orders as above. Follow up in 6 mo or prn. The patient voiced understanding and agreement to the plan.  [place], DO 05/10/22 9:05 AM

## 2022-05-13 ENCOUNTER — Other Ambulatory Visit: Payer: Self-pay

## 2022-05-13 DIAGNOSIS — Z862 Personal history of diseases of the blood and blood-forming organs and certain disorders involving the immune mechanism: Secondary | ICD-10-CM

## 2022-05-13 MED ORDER — FERROUS SULFATE 325 (65 FE) MG PO TBEC: 325.0000 mg | DELAYED_RELEASE_TABLET | Freq: Every day | ORAL | 3 refills | Status: AC

## 2022-05-14 ENCOUNTER — Encounter: Payer: Medicare HMO | Admitting: Physical Therapy

## 2022-05-16 ENCOUNTER — Encounter: Payer: Medicare HMO | Admitting: Physical Therapy

## 2022-05-20 ENCOUNTER — Encounter: Payer: Self-pay | Admitting: Gastroenterology

## 2022-05-21 ENCOUNTER — Encounter: Payer: Medicare HMO | Admitting: Physical Therapy

## 2022-05-23 ENCOUNTER — Encounter: Payer: Medicare HMO | Admitting: Physical Therapy

## 2022-08-04 ENCOUNTER — Other Ambulatory Visit: Payer: Self-pay | Admitting: Family Medicine

## 2022-08-04 DIAGNOSIS — I10 Essential (primary) hypertension: Secondary | ICD-10-CM

## 2022-08-08 ENCOUNTER — Other Ambulatory Visit: Payer: Self-pay | Admitting: Adult Health

## 2022-08-08 DIAGNOSIS — Z1231 Encounter for screening mammogram for malignant neoplasm of breast: Secondary | ICD-10-CM

## 2022-09-16 ENCOUNTER — Ambulatory Visit: Payer: Medicare HMO | Admitting: Physician Assistant

## 2022-09-25 ENCOUNTER — Ambulatory Visit
Admission: RE | Admit: 2022-09-25 | Discharge: 2022-09-25 | Disposition: A | Discharge status: Home / Self Care | Payer: Medicare HMO | Source: Ambulatory Visit | Attending: Adult Health | Admitting: Adult Health

## 2022-09-25 DIAGNOSIS — Z1231 Encounter for screening mammogram for malignant neoplasm of breast: Secondary | ICD-10-CM

## 2022-09-26 ENCOUNTER — Encounter: Payer: Self-pay | Admitting: Orthopaedic Surgery

## 2022-09-26 ENCOUNTER — Ambulatory Visit (INDEPENDENT_AMBULATORY_CARE_PROVIDER_SITE_OTHER): Payer: Medicare HMO

## 2022-09-26 ENCOUNTER — Other Ambulatory Visit: Payer: Self-pay | Admitting: Adult Health

## 2022-09-26 ENCOUNTER — Telehealth: Payer: Self-pay | Admitting: *Deleted

## 2022-09-26 ENCOUNTER — Ambulatory Visit: Payer: Medicare HMO | Admitting: Orthopaedic Surgery

## 2022-09-26 DIAGNOSIS — Z96652 Presence of left artificial knee joint: Secondary | ICD-10-CM

## 2022-09-26 DIAGNOSIS — R928 Other abnormal and inconclusive findings on diagnostic imaging of breast: Secondary | ICD-10-CM

## 2022-09-26 NOTE — Telephone Encounter (Signed)
Ortho bundle in office visit completed.

## 2022-09-26 NOTE — Progress Notes (Signed)
The patient is now 6 months status post a left total knee replacement.  She says that knee replacement is done very well for her.  She has not known severe end-stage arthritis of her right knee and has failed conservative treatment the right knee.  She would like to have that one replaced in June of this coming year.  Her left knee was replaced in June of this year.  On exam her left knee is nice and straight.  She has full extension and full flexion.  It is limply stable.  Her right knee has varus malalignment with significant pain and crepitation.  An AP and lateral of the left knee standing also shows the right knee.  The left knee has a well-seated total knee arthroplasty with no complicating features.  The right knee has significant varus malalignment with bone-on-bone wear of the medial compartment and previous views also show bone-on-bone wear of the patellofemoral joint and osteophytes in all 3 compartments of the right knee.  At this point we will see her in 3 months around March of this year for setting her up for surgery in June.  All question concerns were answered and addressed.

## 2022-09-28 ENCOUNTER — Encounter (HOSPITAL_BASED_OUTPATIENT_CLINIC_OR_DEPARTMENT_OTHER): Payer: Self-pay | Admitting: Emergency Medicine

## 2022-09-28 ENCOUNTER — Encounter (HOSPITAL_COMMUNITY): Payer: Self-pay

## 2022-09-28 ENCOUNTER — Inpatient Hospital Stay (HOSPITAL_BASED_OUTPATIENT_CLINIC_OR_DEPARTMENT_OTHER)
Admission: EM | Admit: 2022-09-28 | Discharge: 2022-09-30 | DRG: 390 | Disposition: A | Discharge status: Home / Self Care | Payer: Medicare HMO | Attending: Internal Medicine | Admitting: Internal Medicine

## 2022-09-28 ENCOUNTER — Emergency Department (HOSPITAL_BASED_OUTPATIENT_CLINIC_OR_DEPARTMENT_OTHER): Payer: Medicare HMO

## 2022-09-28 ENCOUNTER — Other Ambulatory Visit: Payer: Self-pay

## 2022-09-28 DIAGNOSIS — I700 Atherosclerosis of aorta: Secondary | ICD-10-CM | POA: Diagnosis not present

## 2022-09-28 DIAGNOSIS — E669 Obesity, unspecified: Secondary | ICD-10-CM | POA: Diagnosis present

## 2022-09-28 DIAGNOSIS — Z87891 Personal history of nicotine dependence: Secondary | ICD-10-CM | POA: Diagnosis not present

## 2022-09-28 DIAGNOSIS — R000 Tachycardia, unspecified: Secondary | ICD-10-CM | POA: Diagnosis present

## 2022-09-28 DIAGNOSIS — R1084 Generalized abdominal pain: Secondary | ICD-10-CM | POA: Diagnosis present

## 2022-09-28 DIAGNOSIS — D649 Anemia, unspecified: Secondary | ICD-10-CM | POA: Diagnosis present

## 2022-09-28 DIAGNOSIS — Z8261 Family history of arthritis: Secondary | ICD-10-CM

## 2022-09-28 DIAGNOSIS — K6389 Other specified diseases of intestine: Secondary | ICD-10-CM | POA: Diagnosis not present

## 2022-09-28 DIAGNOSIS — Z4682 Encounter for fitting and adjustment of non-vascular catheter: Secondary | ICD-10-CM | POA: Diagnosis not present

## 2022-09-28 DIAGNOSIS — K219 Gastro-esophageal reflux disease without esophagitis: Secondary | ICD-10-CM | POA: Diagnosis present

## 2022-09-28 DIAGNOSIS — K56609 Unspecified intestinal obstruction, unspecified as to partial versus complete obstruction: Principal | ICD-10-CM

## 2022-09-28 DIAGNOSIS — M1990 Unspecified osteoarthritis, unspecified site: Secondary | ICD-10-CM | POA: Diagnosis present

## 2022-09-28 DIAGNOSIS — Z96652 Presence of left artificial knee joint: Secondary | ICD-10-CM | POA: Diagnosis present

## 2022-09-28 DIAGNOSIS — I10 Essential (primary) hypertension: Secondary | ICD-10-CM

## 2022-09-28 DIAGNOSIS — Z79899 Other long term (current) drug therapy: Secondary | ICD-10-CM

## 2022-09-28 DIAGNOSIS — Z9049 Acquired absence of other specified parts of digestive tract: Secondary | ICD-10-CM | POA: Diagnosis not present

## 2022-09-28 DIAGNOSIS — R109 Unspecified abdominal pain: Secondary | ICD-10-CM | POA: Diagnosis not present

## 2022-09-28 DIAGNOSIS — Z853 Personal history of malignant neoplasm of breast: Secondary | ICD-10-CM

## 2022-09-28 DIAGNOSIS — Z923 Personal history of irradiation: Secondary | ICD-10-CM | POA: Diagnosis not present

## 2022-09-28 DIAGNOSIS — R1110 Vomiting, unspecified: Secondary | ICD-10-CM | POA: Diagnosis not present

## 2022-09-28 DIAGNOSIS — Z6835 Body mass index (BMI) 35.0-35.9, adult: Secondary | ICD-10-CM | POA: Diagnosis not present

## 2022-09-28 DIAGNOSIS — Z8249 Family history of ischemic heart disease and other diseases of the circulatory system: Secondary | ICD-10-CM

## 2022-09-28 DIAGNOSIS — K5669 Other partial intestinal obstruction: Secondary | ICD-10-CM | POA: Diagnosis not present

## 2022-09-28 DIAGNOSIS — R739 Hyperglycemia, unspecified: Secondary | ICD-10-CM | POA: Diagnosis present

## 2022-09-28 LAB — URINALYSIS, ROUTINE W REFLEX MICROSCOPIC
Bilirubin Urine: NEGATIVE
Glucose, UA: NEGATIVE mg/dL
Ketones, ur: NEGATIVE mg/dL
Nitrite: NEGATIVE
Protein, ur: NEGATIVE mg/dL
Specific Gravity, Urine: 1.025 (ref 1.005–1.030)
pH: 5.5 (ref 5.0–8.0)

## 2022-09-28 LAB — COMPREHENSIVE METABOLIC PANEL
ALT: 9 U/L (ref 0–44)
AST: 15 U/L (ref 15–41)
Albumin: 4.3 g/dL (ref 3.5–5.0)
Alkaline Phosphatase: 67 U/L (ref 38–126)
Anion gap: 8 (ref 5–15)
BUN: 11 mg/dL (ref 8–23)
CO2: 26 mmol/L (ref 22–32)
Calcium: 9.2 mg/dL (ref 8.9–10.3)
Chloride: 105 mmol/L (ref 98–111)
Creatinine, Ser: 0.74 mg/dL (ref 0.44–1.00)
GFR, Estimated: >60 mL/min (ref >60)
Glucose, Bld: 146 mg/dL — ABNORMAL HIGH (ref 70–99)
Potassium: 3.6 mmol/L (ref 3.5–5.1)
Sodium: 139 mmol/L (ref 135–145)
Total Bilirubin: 0.4 mg/dL (ref 0.3–1.2)
Total Protein: 8 g/dL (ref 6.5–8.1)

## 2022-09-28 LAB — CBC WITH DIFFERENTIAL/PLATELET
Abs Immature Granulocytes: 0.03 10*3/uL (ref 0.00–0.07)
Basophils Absolute: 0 10*3/uL (ref 0.0–0.1)
Basophils Relative: 0 %
Eosinophils Absolute: 0.3 10*3/uL (ref 0.0–0.5)
Eosinophils Relative: 3 %
HCT: 41.5 % (ref 36.0–46.0)
Hemoglobin: 13.7 g/dL (ref 12.0–15.0)
Immature Granulocytes: 0 %
Lymphocytes Relative: 10 %
Lymphs Abs: 0.9 10*3/uL (ref 0.7–4.0)
MCH: 30 pg (ref 26.0–34.0)
MCHC: 33 g/dL (ref 30.0–36.0)
MCV: 91 fL (ref 80.0–100.0)
Monocytes Absolute: 0.6 10*3/uL (ref 0.1–1.0)
Monocytes Relative: 6 %
Neutro Abs: 7.8 10*3/uL — ABNORMAL HIGH (ref 1.7–7.7)
Neutrophils Relative %: 81 %
Platelets: 227 10*3/uL (ref 150–400)
RBC: 4.56 MIL/uL (ref 3.87–5.11)
RDW: 13 % (ref 11.5–15.5)
WBC: 9.7 10*3/uL (ref 4.0–10.5)
nRBC: 0 % (ref 0.0–0.2)

## 2022-09-28 LAB — CBG MONITORING, ED: Glucose-Capillary: 145 mg/dL — ABNORMAL HIGH (ref 70–99)

## 2022-09-28 LAB — URINALYSIS, MICROSCOPIC (REFLEX)

## 2022-09-28 LAB — LIPASE, BLOOD: Lipase: 33 U/L (ref 11–51)

## 2022-09-28 MED ORDER — ONDANSETRON HCL 4 MG/2ML IJ SOLN
4.0000 mg | Freq: Once | INTRAMUSCULAR | Status: AC
  Administered 2022-09-28: 4 mg via INTRAVENOUS
  Filled 2022-09-28: qty 2

## 2022-09-28 MED ORDER — SODIUM CHLORIDE 0.9 % IV BOLUS
1000.0000 mL | Freq: Once | INTRAVENOUS | Status: AC
  Administered 2022-09-28: 1000 mL via INTRAVENOUS

## 2022-09-28 MED ORDER — MORPHINE SULFATE (PF) 2 MG/ML IV SOLN
2.0000 mg | Freq: Once | INTRAVENOUS | Status: AC
  Administered 2022-09-28: 2 mg via INTRAVENOUS
  Filled 2022-09-28: qty 1

## 2022-09-28 MED ORDER — IOHEXOL 300 MG/ML  SOLN
85.0000 mL | Freq: Once | INTRAMUSCULAR | Status: AC | PRN
  Administered 2022-09-28: 85 mL via INTRAVENOUS

## 2022-09-28 MED ORDER — METOCLOPRAMIDE HCL 5 MG/ML IJ SOLN
10.0000 mg | Freq: Once | INTRAMUSCULAR | Status: AC
  Administered 2022-09-28: 10 mg via INTRAVENOUS
  Filled 2022-09-28: qty 2

## 2022-09-28 NOTE — ED Provider Notes (Cosign Needed)
[facility] Provider Note   CSN: [id number] Arrival date & time: 09/28/22  1914     History  Chief Complaint  Patient presents with   Abdominal Pain    Kelly Dixon is a 76 y.o. female.  With past medical history of hypertension, GERD, breast cancer, arthritis who presents to the emergency department with abdominal pain.  Patient states that about 4 PM this evening she had a sudden onset of abdominal pain.  She states that immediately afterwards she began having nausea and vomiting.  She describes the pain as sharp and constant.  She states that it radiates from the right to the left in her lower abdomen but then also points midline vertically.  She began having chills once she got here to the emergency department. She denies radiation of her pain to the back.  She denies having any diarrhea.  Denies having dysuria, vaginal discharge.  She denies having hematemesis, melena, hematochezia.  Denies eating something that may have been contaminated or recent antibiotics.   Abdominal Pain Associated symptoms: chills, nausea and vomiting   Associated symptoms: no diarrhea, no dysuria and no vaginal discharge        Home Medications Prior to Admission medications   Medication Sig Start Date End Date Taking? Authorizing Provider  acetaminophen (TYLENOL) 500 MG tablet Take 1,000 mg by mouth every 6 (six) hours as needed for moderate pain.    [provider]  amLODipine (NORVASC) 5 MG tablet TAKE 1 TABLET(5 MG) BY MOUTH DAILY 08/05/22   [name], Crosby Oyster, DO  ferrous sulfate 325 (65 FE) MG EC tablet Take 1 tablet (325 mg total) by mouth daily with breakfast. 05/13/22   Mansouraty, Telford Nab., MD  fexofenadine (ALLEGRA) 180 MG tablet Take 180 mg by mouth daily.    [provider]      Allergies    Patient has no known allergies.    Review of Systems   Review of Systems  Constitutional:  Positive for appetite change and chills.   Gastrointestinal:  Positive for abdominal pain, nausea and vomiting. Negative for diarrhea.  Genitourinary:  Negative for dysuria and vaginal discharge.  All other systems reviewed and are negative.   Physical Exam Updated Vital Signs BP (!) 144/75   Pulse 99   Temp 98.2 F (36.8 C) (Oral)   Resp (!) 23   Ht '5\' 2"'$  (1.575 m)   Wt 88 kg   SpO2 95%   BMI 35.48 kg/m  Physical Exam Vitals and nursing note reviewed.  Constitutional:      Appearance: She is well-developed. She is ill-appearing. She is not toxic-appearing.  HENT:     Head: Normocephalic and atraumatic.  Eyes:     General: No scleral icterus. Cardiovascular:     Rate and Rhythm: Regular rhythm. Tachycardia present.     Heart sounds: Normal heart sounds. No murmur heard. Pulmonary:     Effort: Pulmonary effort is normal. No respiratory distress.  Abdominal:     General: Abdomen is protuberant. Bowel sounds are increased. There is no distension.     Palpations: Abdomen is soft.     Tenderness: There is generalized abdominal tenderness. There is guarding. There is no rebound.  Skin:    General: Skin is warm and dry.     Capillary Refill: Capillary refill takes less than 2 seconds.     Findings: No rash.  Neurological:     General: No focal deficit present.  Mental Status: She is alert and oriented to person, place, and time.  Psychiatric:        Mood and Affect: Mood normal.        Behavior: Behavior normal.        Thought Content: Thought content normal.        Judgment: Judgment normal.     ED Results / Procedures / Treatments   Labs (all labs ordered are listed, but only abnormal results are displayed) Labs Reviewed  COMPREHENSIVE METABOLIC PANEL - Abnormal; Notable for the following components:      Result Value   Glucose, Bld 146 (*)    All other components within normal limits  CBC WITH DIFFERENTIAL/PLATELET - Abnormal; Notable for the following components:   Neutro Abs 7.8 (*)    All other  components within normal limits  URINALYSIS, ROUTINE W REFLEX MICROSCOPIC - Abnormal; Notable for the following components:   APPearance HAZY (*)    Hgb urine dipstick TRACE (*)    Leukocytes,Ua MODERATE (*)    All other components within normal limits  URINALYSIS, MICROSCOPIC (REFLEX) - Abnormal; Notable for the following components:   Bacteria, UA MANY (*)    Non Squamous Epithelial PRESENT (*)    All other components within normal limits  CBG MONITORING, ED - Abnormal; Notable for the following components:   Glucose-Capillary 145 (*)    All other components within normal limits  LIPASE, BLOOD    EKG None  Radiology CT Abdomen Pelvis W Contrast  Result Date: 09/28/2022 CLINICAL DATA:  Abdomen pain vomiting EXAM: CT ABDOMEN AND PELVIS WITH CONTRAST TECHNIQUE: Multidetector CT imaging of the abdomen and pelvis was performed using the standard protocol following bolus administration of intravenous contrast. RADIATION DOSE REDUCTION: This exam was performed according to the departmental dose-optimization program which includes automated exposure control, adjustment of the mA and/or kV according to patient size and/or use of iterative reconstruction technique. CONTRAST:  55m OMNIPAQUE IOHEXOL 300 MG/ML  SOLN COMPARISON:  None Available. FINDINGS: Lower chest: Lung bases are clear.  Small hiatal hernia Hepatobiliary: Status post cholecystectomy. Mild intra and extrahepatic biliary dilatation likely due to surgical change. Pancreas: Unremarkable. No pancreatic ductal dilatation or surrounding inflammatory changes. Spleen: Normal in size without focal abnormality. Adrenals/Urinary Tract: Adrenal glands are normal. Kidneys show no hydronephrosis. 4 mm nonobstructing left kidney stone. The bladder is unremarkable. Stomach/Bowel: Mild fluid distension of the stomach. Ligament of Treitz does not definitively cross midline and is slightly low in position. Multiple fluid-filled dilated loops of small  bowel in the left abdomen measuring up to 3.8 cm without well-defined transition point. Terminal ileum is also fluid-filled and dilated. No acute bowel wall thickening. The colon is decompressed. Sigmoid colon diverticular disease. Vascular/Lymphatic: Moderate aortic atherosclerosis. No aneurysm. No suspicious lymph nodes Reproductive: Uterus and bilateral adnexa are unremarkable. Other: No free air.  Small free fluid in the pelvis. Musculoskeletal: No acute osseous abnormality. IMPRESSION: 1. Multiple fluid-filled dilated loops of small bowel with overall appearance suspicious for a bowel obstruction transition point not well-defined but possibly at the distal ileum. Negative for intramural air or free air. Small volume free fluid in the pelvis. 2. Diverticular disease of the left colon without acute wall thickening 3. Slightly low lying duodenal jejunal junction which does not completely cross the midline suggesting malrotation but no findings to suggest volvulus as cause for the bowel obstruction. 4. Nonobstructing left kidney stone Electronically Signed   By: KDonavan FoilM.D.   On: 09/28/2022  21:47    Procedures Procedures   Medications Ordered in ED Medications  sodium chloride 0.9 % bolus 1,000 mL (0 mLs Intravenous Stopped 09/28/22 2227)  ondansetron (ZOFRAN) injection 4 mg (4 mg Intravenous Given 09/28/22 2042)  morphine (PF) 2 MG/ML injection 2 mg (2 mg Intravenous Given 09/28/22 2043)  iohexol (OMNIPAQUE) 300 MG/ML solution 85 mL (85 mLs Intravenous Contrast Given 09/28/22 2118)  metoCLOPramide (REGLAN) injection 10 mg (10 mg Intravenous Given 09/28/22 2121)    ED Course/ Medical Decision Making/ A&P Clinical Course as of 09/28/22 2259  Sat Sep 28, 2022  2042 Stable 63 YOM with sudden abdominal pain.  CT /Labs and dispo [CC]  2207 Consulted with Dr. Harlow Asa, gen surg who recommends hospitalist admission, will consult.  [LA]  2252 Consulted and spoke with Dr. Nevada Crane, hospitalist who  agrees to admit patient to [facility] [LA]    Clinical Course User Index [CC] Tretha Sciara, MD [LA] Mickie Hillier, PA-C                           Medical Decision Making Amount and/or Complexity of Data Reviewed Labs: ordered. Radiology: ordered.  Risk Prescription drug management. Decision regarding hospitalization.  Initial Impression and Ddx 76 year old female who presents to the emergency department with abdominal pain and vomiting.  She is uncomfortable on initial exam, mildly restless, nauseated.  She is diffusely tender to palpation.  Will obtain labs, CT abdomen pelvis, IV with fluids, antiemetics, small dose of morphine. Patient PMH that increases complexity of ED encounter: Hypertension, GERD, breast cancer, arthritis Differential: Acute hepatobiliary disease, pancreatitis, appendicitis, PUD, gastritis, SBO, diverticulitis, colitis, viral gastroenteritis, Crohn's, UC, vascular catastrophe, UTI, pyelonephritis, renal stone, obstructed stone, infected stone, ovarian torsion, ectopic pregnancy, TOA, PID, STD, etc.   Interpretation of Diagnostics I independent reviewed and interpreted the labs as followed: CBC without leukocytosis, CMP without electrolyte derangement, AKI, transaminitis.  UA without UTI, lipase is negative  - I independently visualized the following imaging with scope of interpretation limited to determining acute life threatening conditions related to emergency care: CT abdomen pelvis, which revealed small bowel obstruction  Patient Reassessment and Ultimate Disposition/Management Patient was initially given Zofran and then had a subsequent episode of vomiting.  She was redosed with 10 mg of IV Reglan with improvement in symptoms.  She had no further episodes of vomiting.  Also given morphine with improvement in her symptoms. CT shows a small bowel obstruction.  Given her vomiting will place NG tube and consult general surgery.  Spoke with Dr. Harlow Asa, general  surgery, recommends hospitalist admission preferably over to [facility].  Will consult. Called and spoke with Dr. Nevada Crane, hospitalist who agrees to admit the patient.  Symptoms are inconsistent with other etiologies of abdominal pain like pancreatitis (lipase negative), PUD or gastritis.  UA without definitive UTI and symptoms inconsistent with acute cystitis, pyelonephritis, stone, obstructed stone, infected stone.  Considered and doubt torsion.  No vaginal discharge or concern for PID, TOA or ectopic pregnancy.  Imaging helps to rule out appendicitis, acute hepatobiliary disease.  No evidence of diverticulitis.  As noted above the patient will be admitted to hospitalist service and general surgical consult for her small bowel obstruction.  I have updated the patient on the plan of care and she is agreeable to this.  NG tube has been placed for decompression.  Will obtain post NG tube KUB.   Patient management required discussion with the following services or consulting  groups:  Hospitalist Service and General/Trauma Surgery  Complexity of Problems Addressed Acute complicated illness or Injury  Additional Data Reviewed and Analyzed Further history obtained from: Past medical history and medications listed in the EMR, Recent PCP notes, Care Everywhere, and Prior labs/imaging results  Patient Encounter Risk Assessment Minor Procedures, Use of parenteral controlled substances, Consideration of hospitalization, and Major procedures  Final Clinical Impression(s) / ED Diagnoses Final diagnoses:  Small bowel obstruction [facility]    Rx / DC Orders ED Discharge Orders     None         Mickie Hillier, PA-C 09/28/22 2304

## 2022-09-28 NOTE — ED Triage Notes (Addendum)
Pt c/o generalized (but worse around umbilicus) abd pain that started suddenly around 1600; +vomiting, denies diarrhea

## 2022-09-28 NOTE — ED Notes (Signed)
[facility] at bedside 

## 2022-09-29 ENCOUNTER — Encounter (HOSPITAL_COMMUNITY): Payer: Self-pay | Admitting: Family Medicine

## 2022-09-29 ENCOUNTER — Inpatient Hospital Stay (HOSPITAL_COMMUNITY): Payer: Medicare HMO

## 2022-09-29 DIAGNOSIS — K5669 Other partial intestinal obstruction: Secondary | ICD-10-CM | POA: Diagnosis not present

## 2022-09-29 DIAGNOSIS — M1990 Unspecified osteoarthritis, unspecified site: Secondary | ICD-10-CM | POA: Diagnosis present

## 2022-09-29 DIAGNOSIS — K219 Gastro-esophageal reflux disease without esophagitis: Secondary | ICD-10-CM | POA: Diagnosis present

## 2022-09-29 DIAGNOSIS — K6389 Other specified diseases of intestine: Secondary | ICD-10-CM | POA: Diagnosis not present

## 2022-09-29 DIAGNOSIS — D649 Anemia, unspecified: Secondary | ICD-10-CM

## 2022-09-29 DIAGNOSIS — K56609 Unspecified intestinal obstruction, unspecified as to partial versus complete obstruction: Secondary | ICD-10-CM | POA: Diagnosis present

## 2022-09-29 DIAGNOSIS — Z923 Personal history of irradiation: Secondary | ICD-10-CM | POA: Diagnosis not present

## 2022-09-29 DIAGNOSIS — Z96652 Presence of left artificial knee joint: Secondary | ICD-10-CM | POA: Diagnosis present

## 2022-09-29 DIAGNOSIS — Z8261 Family history of arthritis: Secondary | ICD-10-CM | POA: Diagnosis not present

## 2022-09-29 DIAGNOSIS — Z8249 Family history of ischemic heart disease and other diseases of the circulatory system: Secondary | ICD-10-CM | POA: Diagnosis not present

## 2022-09-29 DIAGNOSIS — E669 Obesity, unspecified: Secondary | ICD-10-CM

## 2022-09-29 DIAGNOSIS — R739 Hyperglycemia, unspecified: Secondary | ICD-10-CM | POA: Diagnosis present

## 2022-09-29 DIAGNOSIS — Z79899 Other long term (current) drug therapy: Secondary | ICD-10-CM | POA: Diagnosis not present

## 2022-09-29 DIAGNOSIS — Z87891 Personal history of nicotine dependence: Secondary | ICD-10-CM | POA: Diagnosis not present

## 2022-09-29 DIAGNOSIS — Z9049 Acquired absence of other specified parts of digestive tract: Secondary | ICD-10-CM | POA: Diagnosis not present

## 2022-09-29 DIAGNOSIS — Z4682 Encounter for fitting and adjustment of non-vascular catheter: Secondary | ICD-10-CM | POA: Diagnosis not present

## 2022-09-29 DIAGNOSIS — R1084 Generalized abdominal pain: Secondary | ICD-10-CM | POA: Diagnosis present

## 2022-09-29 DIAGNOSIS — I10 Essential (primary) hypertension: Secondary | ICD-10-CM | POA: Diagnosis present

## 2022-09-29 DIAGNOSIS — Z853 Personal history of malignant neoplasm of breast: Secondary | ICD-10-CM | POA: Diagnosis not present

## 2022-09-29 DIAGNOSIS — Z6835 Body mass index (BMI) 35.0-35.9, adult: Secondary | ICD-10-CM | POA: Diagnosis not present

## 2022-09-29 DIAGNOSIS — R000 Tachycardia, unspecified: Secondary | ICD-10-CM | POA: Diagnosis present

## 2022-09-29 LAB — CBC
HCT: 37 % (ref 36.0–46.0)
Hemoglobin: 11.8 g/dL — ABNORMAL LOW (ref 12.0–15.0)
MCH: 30.2 pg (ref 26.0–34.0)
MCHC: 31.9 g/dL (ref 30.0–36.0)
MCV: 94.6 fL (ref 80.0–100.0)
Platelets: 203 10*3/uL (ref 150–400)
RBC: 3.91 MIL/uL (ref 3.87–5.11)
RDW: 13.3 % (ref 11.5–15.5)
WBC: 6.4 10*3/uL (ref 4.0–10.5)
nRBC: 0 % (ref 0.0–0.2)

## 2022-09-29 LAB — BASIC METABOLIC PANEL
Anion gap: 6 (ref 5–15)
BUN: 10 mg/dL (ref 8–23)
CO2: 26 mmol/L (ref 22–32)
Calcium: 8.5 mg/dL — ABNORMAL LOW (ref 8.9–10.3)
Chloride: 106 mmol/L (ref 98–111)
Creatinine, Ser: 0.69 mg/dL (ref 0.44–1.00)
GFR, Estimated: >60 mL/min (ref >60)
Glucose, Bld: 113 mg/dL — ABNORMAL HIGH (ref 70–99)
Potassium: 3.9 mmol/L (ref 3.5–5.1)
Sodium: 138 mmol/L (ref 135–145)

## 2022-09-29 LAB — GLUCOSE, CAPILLARY
Glucose-Capillary: 114 mg/dL — ABNORMAL HIGH (ref 70–99)
Glucose-Capillary: 105 mg/dL — ABNORMAL HIGH (ref 70–99)
Glucose-Capillary: 101 mg/dL — ABNORMAL HIGH (ref 70–99)

## 2022-09-29 LAB — MAGNESIUM: Magnesium: 2.2 mg/dL (ref 1.7–2.4)

## 2022-09-29 MED ORDER — SODIUM CHLORIDE 0.9% FLUSH
3.0000 mL | Freq: Two times a day (BID) | INTRAVENOUS | Status: DC
  Administered 2022-09-29 – 2022-09-30 (×4): 3 mL via INTRAVENOUS

## 2022-09-29 MED ORDER — ONDANSETRON HCL 4 MG PO TABS: 4.0000 mg | ORAL_TABLET | Freq: Four times a day (QID) | ORAL | Status: DC | PRN

## 2022-09-29 MED ORDER — DIATRIZOATE MEGLUMINE & SODIUM 66-10 % PO SOLN
90.0000 mL | Freq: Once | ORAL | Status: AC
  Administered 2022-09-29: 90 mL via NASOGASTRIC
  Filled 2022-09-29: qty 90

## 2022-09-29 MED ORDER — LABETALOL HCL 5 MG/ML IV SOLN: 10.0000 mg | Freq: Four times a day (QID) | INTRAVENOUS | Status: DC | PRN

## 2022-09-29 MED ORDER — POTASSIUM CHLORIDE 10 MEQ/100ML IV SOLN
10.0000 meq | INTRAVENOUS | Status: AC
  Administered 2022-09-29 (×2): 10 meq via INTRAVENOUS
  Filled 2022-09-29 (×2): qty 100

## 2022-09-29 MED ORDER — HYDROMORPHONE HCL 1 MG/ML IJ SOLN: 0.5000 mg | INTRAMUSCULAR | Status: DC | PRN

## 2022-09-29 MED ORDER — ACETAMINOPHEN 325 MG PO TABS: 650.0000 mg | ORAL_TABLET | Freq: Four times a day (QID) | ORAL | Status: DC | PRN

## 2022-09-29 MED ORDER — ACETAMINOPHEN 650 MG RE SUPP: 650.0000 mg | Freq: Four times a day (QID) | RECTAL | Status: DC | PRN

## 2022-09-29 MED ORDER — DIPHENHYDRAMINE HCL 50 MG/ML IJ SOLN
12.5000 mg | Freq: Four times a day (QID) | INTRAMUSCULAR | Status: DC | PRN
  Administered 2022-09-29: 12.5 mg via INTRAVENOUS
  Filled 2022-09-29: qty 1

## 2022-09-29 MED ORDER — LORATADINE 10 MG PO TABS
10.0000 mg | ORAL_TABLET | Freq: Every day | ORAL | Status: DC
  Administered 2022-09-30: 10 mg via ORAL
  Filled 2022-09-29: qty 1

## 2022-09-29 MED ORDER — ENOXAPARIN SODIUM 40 MG/0.4ML IJ SOSY
40.0000 mg | PREFILLED_SYRINGE | INTRAMUSCULAR | Status: DC
  Administered 2022-09-29 – 2022-09-30 (×2): 40 mg via SUBCUTANEOUS
  Filled 2022-09-29 (×2): qty 0.4

## 2022-09-29 MED ORDER — ONDANSETRON HCL 4 MG/2ML IJ SOLN: 4.0000 mg | Freq: Four times a day (QID) | INTRAMUSCULAR | Status: DC | PRN

## 2022-09-29 MED ORDER — LACTATED RINGERS IV SOLN
INTRAVENOUS | Status: AC
  Administered 2022-09-29: 100 mL via INTRAVENOUS

## 2022-09-29 NOTE — H&P (Signed)
History and Physical    Kelly Dixon ZDG:[id number] DOB: [birth year] DOA: 09/28/2022  PCP: Shelda Pal, DO   Patient coming from: Home   Chief Complaint: Abdominal pain, N/V   HPI: Kelly Dixon is a pleasant 76 y.o. female with medical history significant for breast cancer, hypertension, laparoscopic cholecystectomy, and remote tubal ligation who presents to the emergency department with abdominal pain, nausea, and vomiting.  Patient reports that she been in her usual state of health until approximately 4 PM today when she developed acute onset of pain across the mid abdomen, nausea, and nonbloody vomiting.  She had a similar episode approximately 3 weeks ago after eating pizza, but notes that the person she was eating the pizza with also developed nausea and vomiting then.  [facility] ED Course: Upon arrival to the ED, patient is found to be afebrile and saturating in the 90s on room air with transient tachycardia and stable blood pressure.  CMP and CBC were unremarkable and lipase was normal.  CT of the abdomen and pelvis demonstrates multiple fluid-filled loops of small bowel suspicious for SBO.  Surgery was consulted by the ED PA, NG tube was placed, and the patient was given a liter of normal saline, morphine, Reglan, and Zofran.  She was transferred to [facility] for admission.  Review of Systems:  All other systems reviewed and apart from HPI, are negative.  Past Medical History:  Diagnosis Date   Anemia    Arthritis    Breast cancer ([place]) 2020   right breast   Gallstones 05/31/2018   GERD (gastroesophageal reflux disease)    Hypertension    Personal history of radiation therapy    PONV (postoperative nausea and vomiting)    Syphilis     Past Surgical History:  Procedure Laterality Date   BREAST BIOPSY Right 08/2019   BREAST LUMPECTOMY Right 08/2019   BREAST LUMPECTOMY WITH RADIOACTIVE SEED AND SENTINEL LYMPH NODE BIOPSY Right 09/15/2019   Procedure:  RIGHT BREAST LUMPECTOMY WITH RADIOACTIVE SEED AND SENTINEL LYMPH NODE BIOPSY;  Surgeon: Donnie Mesa, MD;  Location: [place];  Service: General;  Laterality: Right;   CHOLECYSTECTOMY N/A 04/13/2019   Procedure: LAPAROSCOPIC CHOLECYSTECTOMY WITH INTRAOPERATIVE CHOLANGIOGRAM;  Surgeon: Donnie Mesa, MD;  Location: [place];  Service: General;  Laterality: N/A;   DILATION AND CURETTAGE OF UTERUS     TOTAL KNEE ARTHROPLASTY Left 03/26/2022   Procedure: LEFT TOTAL KNEE ARTHROPLASTY;  Surgeon: Mcarthur Rossetti, MD;  Location: [place];  Service: Orthopedics;  Laterality: Left;    Social History:   reports that she quit smoking about 2 years ago. Her smoking use included cigarettes. She smoked an average of .5 packs per day. She has never used smokeless tobacco. She reports current alcohol use. She reports that she does not use drugs.  No Known Allergies  Family History  Problem Relation Age of Onset   Arthritis Mother    Hearing loss Mother    Heart disease Father    Arthritis Sister    Colon cancer Neg Hx    Colon polyps Neg Hx    Esophageal cancer Neg Hx    Rectal cancer Neg Hx    Stomach cancer Neg Hx      Prior to Admission medications   Medication Sig Start Date End Date Taking? Authorizing Provider  acetaminophen (TYLENOL) 500 MG tablet Take 1,000 mg by mouth every 6 (six) hours as needed for moderate pain.    [provider]  amLODipine (NORVASC) 5 MG tablet TAKE 1 TABLET(5 MG) BY MOUTH DAILY 08/05/22   Wendling, Crosby Oyster, DO  ferrous sulfate 325 (65 FE) MG EC tablet Take 1 tablet (325 mg total) by mouth daily with breakfast. 05/13/22   Mansouraty, Telford Nab., MD  fexofenadine (ALLEGRA) 180 MG tablet Take 180 mg by mouth daily.    [provider]    Physical Exam: Vitals:   09/28/22 2330 09/28/22 2344 09/28/22 2345 09/29/22 0024  BP: 136/71   136/76  Pulse: 96  (!) 106 (!) 103  Resp: 20  (!) 21 18  Temp:  99.2 F (37.3 C)  99 F  (37.2 C)  TempSrc:    Oral  SpO2: 91%  93% 97%  Weight:      Height:        Constitutional: NAD, calm  Eyes: PERTLA, lids and conjunctivae normal ENMT: Mucous membranes are moist. Posterior pharynx clear of any exudate or lesions.   Neck: supple, no masses  Respiratory: no wheezing, no crackles. No accessory muscle use.  Cardiovascular: S1 & S2 heard, regular rate and rhythm. No JVD. Abdomen: soft, no rebound pain or guarding. Occasional high-pitched bowel sound.  Musculoskeletal: no clubbing / cyanosis. No joint deformity upper and lower extremities.   Skin: no significant rashes, lesions, ulcers. Warm, dry, well-perfused. Neurologic: CN 2-12 grossly intact. Moving all extremities. Alert and oriented.  Psychiatric: Pleasant. Cooperative.    Labs and Imaging on Admission: I have personally reviewed following labs and imaging studies  CBC: Recent Labs  Lab 09/28/22 2010  WBC 9.7  NEUTROABS 7.8*  HGB 13.7  HCT 41.5  MCV 91.0  PLT 621   Basic Metabolic Panel: Recent Labs  Lab 09/28/22 2010  NA 139  K 3.6  CL 105  CO2 26  GLUCOSE 146*  BUN 11  CREATININE 0.74  CALCIUM 9.2   GFR: Estimated Creatinine Clearance: 63.6 mL/min (by C-G formula based on SCr of 0.74 mg/dL). Liver Function Tests: Recent Labs  Lab 09/28/22 2010  AST 15  ALT 9  ALKPHOS 67  BILITOT 0.4  PROT 8.0  ALBUMIN 4.3   Recent Labs  Lab 09/28/22 2010  LIPASE 33   No results for input(s): "AMMONIA" in the last 168 hours. Coagulation Profile: No results for input(s): "INR", "PROTIME" in the last 168 hours. Cardiac Enzymes: No results for input(s): "CKTOTAL", "CKMB", "CKMBINDEX", "TROPONINI" in the last 168 hours. BNP (last 3 results) No results for input(s): "PROBNP" in the last 8760 hours. HbA1C: No results for input(s): "HGBA1C" in the last 72 hours. CBG: Recent Labs  Lab 09/28/22 2029  GLUCAP 145*   Lipid Profile: No results for input(s): "CHOL", "HDL", "LDLCALC", "TRIG",  "CHOLHDL", "LDLDIRECT" in the last 72 hours. Thyroid Function Tests: No results for input(s): "TSH", "T4TOTAL", "FREET4", "T3FREE", "THYROIDAB" in the last 72 hours. Anemia Panel: No results for input(s): "VITAMINB12", "FOLATE", "FERRITIN", "TIBC", "IRON", "RETICCTPCT" in the last 72 hours. Urine analysis:    Component Value Date/Time   COLORURINE YELLOW 09/28/2022 1930   APPEARANCEUR HAZY (A) 09/28/2022 1930   LABSPEC 1.025 09/28/2022 1930   PHURINE 5.5 09/28/2022 1930   GLUCOSEU NEGATIVE 09/28/2022 1930   HGBUR TRACE (A) 09/28/2022 1930   BILIRUBINUR NEGATIVE 09/28/2022 1930   KETONESUR NEGATIVE 09/28/2022 1930   PROTEINUR NEGATIVE 09/28/2022 1930   NITRITE NEGATIVE 09/28/2022 1930   LEUKOCYTESUR MODERATE (A) 09/28/2022 1930   Sepsis Labs: '@LABRCNTIP'$ (procalcitonin:4,lacticidven:4) )No results found for this or any previous visit (from the past 240 hour(s)).  Radiological Exams on Admission: DG Abd Portable 1V  Result Date: 09/28/2022 CLINICAL DATA:  Encounter for nasogastric tube placement. EXAM: PORTABLE ABDOMEN - 1 VIEW COMPARISON:  None Available. FINDINGS: NG tube with side port and distal tip projecting over the gastric fundus. Partially imaged abdomen demonstrate multiple dilated small bowel loops concerning for ileus and/or obstruction. IMPRESSION: NG tube with side port and distal tip projecting over the gastric fundus. Electronically Signed   By: Keane Police D.O.   On: 09/28/2022 23:20   CT Abdomen Pelvis W Contrast  Result Date: 09/28/2022 CLINICAL DATA:  Abdomen pain vomiting EXAM: CT ABDOMEN AND PELVIS WITH CONTRAST TECHNIQUE: Multidetector CT imaging of the abdomen and pelvis was performed using the standard protocol following bolus administration of intravenous contrast. RADIATION DOSE REDUCTION: This exam was performed according to the departmental dose-optimization program which includes automated exposure control, adjustment of the mA and/or kV according to  patient size and/or use of iterative reconstruction technique. CONTRAST:  74m OMNIPAQUE IOHEXOL 300 MG/ML  SOLN COMPARISON:  None Available. FINDINGS: Lower chest: Lung bases are clear.  Small hiatal hernia Hepatobiliary: Status post cholecystectomy. Mild intra and extrahepatic biliary dilatation likely due to surgical change. Pancreas: Unremarkable. No pancreatic ductal dilatation or surrounding inflammatory changes. Spleen: Normal in size without focal abnormality. Adrenals/Urinary Tract: Adrenal glands are normal. Kidneys show no hydronephrosis. 4 mm nonobstructing left kidney stone. The bladder is unremarkable. Stomach/Bowel: Mild fluid distension of the stomach. Ligament of Treitz does not definitively cross midline and is slightly low in position. Multiple fluid-filled dilated loops of small bowel in the left abdomen measuring up to 3.8 cm without well-defined transition point. Terminal ileum is also fluid-filled and dilated. No acute bowel wall thickening. The colon is decompressed. Sigmoid colon diverticular disease. Vascular/Lymphatic: Moderate aortic atherosclerosis. No aneurysm. No suspicious lymph nodes Reproductive: Uterus and bilateral adnexa are unremarkable. Other: No free air.  Small free fluid in the pelvis. Musculoskeletal: No acute osseous abnormality. IMPRESSION: 1. Multiple fluid-filled dilated loops of small bowel with overall appearance suspicious for a bowel obstruction transition point not well-defined but possibly at the distal ileum. Negative for intramural air or free air. Small volume free fluid in the pelvis. 2. Diverticular disease of the left colon without acute wall thickening 3. Slightly low lying duodenal jejunal junction which does not completely cross the midline suggesting malrotation but no findings to suggest volvulus as cause for the bowel obstruction. 4. Nonobstructing left kidney stone Electronically Signed   By: KDonavan FoilM.D.   On: 09/28/2022 21:47    EKG:  Independently reviewed. Sinus rhythm.   Assessment/Plan  1. SBO  - Continue NPO, NGT decompression, IVF hydration, optimize electrolytes, follow-up surgery recommendations   2. HTN  - Treat as-needed only for now     DVT prophylaxis: Lovenox  Code Status: Full  Level of Care: Level of care: Telemetry Family Communication: none present  Disposition Plan:  Patient is from: home  Anticipated d/c is to: Home  Anticipated d/c date is: 10/01/22 Patient currently: Pending return of bowel function  Consults called: Surgery  Admission status: Inpatient     TVianne Bulls MD [facility]  09/29/2022, 1:30 AM

## 2022-09-29 NOTE — Progress Notes (Signed)
Plan of Care Note for accepted transfer   Patient: KRYSTYL CANNELL MRN: [id number]   DOA: 09/28/2022  Facility requesting transfer: [facility] ED. Requesting Provider: Theodis Blaze, PA-EDP. Reason for transfer: Small bowel obstruction.  Facility course: The patient is a 76 year old female with past medical history significant for breast cancer in 2020, hypertension, GERD, postcholecystectomy in 2020, who presented to [facility] ED with complaints of sudden onset diffuse abdominal pain associated with nausea and vomiting.    Noted to have chills in the ED with a Tmax of 99.2, without leukocytosis.  CT abdomen and pelvis with contrast revealed small bowel obstruction with transition point not well-defined but possibly at the distal ileum.  EDP discussed the case with general surgery Dr. Lavone Neri who reviewed imaging, no apparent evidence of volvulus.    General surgery recommended admission to hospitalist medicine service.  NG tube was placed to suction in the ED due to recurrent episodes of vomiting.  The patient received 1 L normal saline IV fluid bolus x 1, IV Zofran 4 mg x 1, IV morphine 2 mg x 1, and IV Reglan 10 mg x 1.  The patient was admitted at [facility] long hospital telemetry unit as inpatient status.  Plan of care: The patient is accepted for admission to Telemetry unit, at [facility] as inpatient status.  Author: Kayleen Memos, DO 09/29/2022  Check [email or web address] for on-call coverage.  Nursing staff, Please call [place] number on Amion as soon as patient's arrival, so appropriate admitting provider can evaluate the pt.

## 2022-09-29 NOTE — Progress Notes (Signed)
PROGRESS NOTE    Kelly Dixon  VFI:[id number] DOB: 02-27-1946 DOA: 09/28/2022 PCP: Shelda Pal, DO   Brief Narrative:  HPI per Dr. Mitzi Hansen on 09/28/22 Kelly Dixon is a pleasant 76 y.o. female with medical history significant for breast cancer, hypertension, laparoscopic cholecystectomy, and remote tubal ligation who presents to the emergency department with abdominal pain, nausea, and vomiting.   Patient reports that she been in her usual state of health until approximately 4 PM today when she developed acute onset of pain across the mid abdomen, nausea, and nonbloody vomiting.  She had a similar episode approximately 3 weeks ago after eating pizza, but notes that the person she was eating the pizza with also developed nausea and vomiting then.   [facility] ED Course: Upon arrival to the ED, patient is found to be afebrile and saturating in the 90s on room air with transient tachycardia and stable blood pressure.  CMP and CBC were unremarkable and lipase was normal.  CT of the abdomen and pelvis demonstrates multiple fluid-filled loops of small bowel suspicious for SBO.   Surgery was consulted by the ED PA, NG tube was placed, and the patient was given a liter of normal saline, morphine, Reglan, and Zofran.  She was transferred to [facility] for admission.  **Interim History -Patient had an NG tube placed and surgery was consulted and have initiated small bowel protocol and recommending pulmonary toileting and out of bed with assistance and feels there is no current surgical indication at this time.  Assessment and Plan: No notes have been filed under this hospital service. Service: Hospitalist  SBO with associated abdominal pain, nausea vomiting -Continue NPO with NGT decompression -Small bowel protocol and surgical consultation feel that this will hopefully resolve on its own no current indication for surgery at this time -Continue with IVF hydration with  lactated Ringer's at 100 MLS per hour, optimize electrolytes,  -History of breast cancer and a history of laparoscopic cholecystectomy as well as remote tubal ligation -Surgery consulted and will Follow-up surgery recommendations as patient is undergoing Small Bowel Protocol    HTN  - Treat as-needed only for now with IV labetalol 10 mg every 6h for systolic blood pressure greater than 416 or diastolic blood pressures in the 100 -Continue to Monitor BP Per Protocol and Last BP reading was 141/76   Normocytic Anemia -Hgb/Hct Trend: Recent Labs  Lab 09/28/22 2010 09/29/22 0705  HGB 13.7 11.8*  HCT 41.5 37.0  MCV 91.0 94.6  -Check Anemia Panel in the AM; Likely Dilutional Drop -Continue to Monitor for S/Sx of Bleeding; No overt bleeding noted -Repeat CBC in the AM  Hyperglycemia -Check HbA1c in the AM -Blood Sugars ranging from 113-146 on Daily BMP/CMP -If necessary will place on Sensitive Novology SSI Insulin  History of breast cancer -Has had right-sided breast cancer with a breast lumpectomy with radioactive seed and sentinel lymph node biopsy  Obesity -Complicates overall prognosis and care -Estimated body mass index is 35.16 kg/m as calculated from the following:   Height as of this encounter: '5\' 2"'$  (1.575 m).   Weight as of this encounter: 87.2 kg.  -Weight Loss and Dietary Counseling given  DVT prophylaxis: enoxaparin (LOVENOX) injection 40 mg Start: 09/29/22 1000    Code Status: Full Code Family Communication: Discussed with family present at bedside   Disposition Plan:  Level of care: Telemetry Status is: Inpatient Remains inpatient given that she will need her NG tube removed and tolerate  diet prior to discharge disposition and clearance for general surgery   Consultants:  General surgery  Procedures:  As delineated as above  Antimicrobials:  Anti-infectives (From admission, onward)    None       Subjective: Seen and examined at bedside and the  patient was doing okay and felt better after her NG tube placement.  Awaiting small bowel protocol.  Denies any lightheadedness or dizziness and thinks that her nausea vomiting is improved.  Feels okay.  No other concerns or continues time.  Objective: Vitals:   09/29/22 0024 09/29/22 0500 09/29/22 0527 09/29/22 0842  BP: 136/76  (!) 124/59 113/67  Pulse: (!) 103  79 72  Resp: '18  18 18  '$ Temp: 99 F (37.2 C)  98.3 F (36.8 C) 100.1 F (37.8 C)  TempSrc: Oral  Oral Oral  SpO2: 97%  96% 95%  Weight:  87.2 kg    Height:        Intake/Output Summary (Last 24 hours) at 09/29/2022 1004 Last data filed at 09/29/2022 0300 Gross per 24 hour  Intake 1057.79 ml  Output --  Net 1057.79 ml   Filed Weights   09/28/22 1922 09/29/22 0500  Weight: 88 kg 87.2 kg   Examination: Physical Exam:  Constitutional: WN/WD obese Caucasian female currently no acute distress  Respiratory: Diminished to auscultation bilaterally, no wheezing, rales, rhonchi or crackles. Normal respiratory effort and patient is not tachypenic. No accessory muscle use.  Unlabored breathing Cardiovascular: RRR, no murmurs / rubs / gallops. S1 and S2 auscultated. No extremity edema.  Abdomen: Soft, slightly-tender, distended secondary to body habitus and bowel sounds are diminished.  GU: Deferred. Musculoskeletal: No clubbing / cyanosis of digits/nails. No joint deformity upper and lower extremities..  Skin: No rashes, lesions, ulcers on limited skin evaluation. No induration; Warm and dry.  Neurologic: CN 2-12 grossly intact with no focal deficits. Romberg sign and cerebellar reflexes not assessed.  Psychiatric: Normal judgment and insight. Alert and oriented x 3. Normal mood and appropriate affect.   Data Reviewed: I have personally reviewed following labs and imaging studies  CBC: Recent Labs  Lab 09/28/22 2010 09/29/22 0705  WBC 9.7 6.4  NEUTROABS 7.8*  --   HGB 13.7 11.8*  HCT 41.5 37.0  MCV 91.0 94.6  PLT  227 540   Basic Metabolic Panel: Recent Labs  Lab 09/28/22 2010 09/29/22 0705  NA 139 138  K 3.6 3.9  CL 105 106  CO2 26 26  GLUCOSE 146* 113*  BUN 11 10  CREATININE 0.74 0.69  CALCIUM 9.2 8.5*  MG  --  2.2   GFR: Estimated Creatinine Clearance: 63.2 mL/min (by C-G formula based on SCr of 0.69 mg/dL). Liver Function Tests: Recent Labs  Lab 09/28/22 2010  AST 15  ALT 9  ALKPHOS 67  BILITOT 0.4  PROT 8.0  ALBUMIN 4.3   Recent Labs  Lab 09/28/22 2010  LIPASE 33   No results for input(s): "AMMONIA" in the last 168 hours. Coagulation Profile: No results for input(s): "INR", "PROTIME" in the last 168 hours. Cardiac Enzymes: No results for input(s): "CKTOTAL", "CKMB", "CKMBINDEX", "TROPONINI" in the last 168 hours. BNP (last 3 results) No results for input(s): "PROBNP" in the last 8760 hours. HbA1C: No results for input(s): "HGBA1C" in the last 72 hours. CBG: Recent Labs  Lab 09/28/22 2029  GLUCAP 145*   Lipid Profile: No results for input(s): "CHOL", "HDL", "LDLCALC", "TRIG", "CHOLHDL", "LDLDIRECT" in the last 72 hours. Thyroid Function Tests:  No results for input(s): "TSH", "T4TOTAL", "FREET4", "T3FREE", "THYROIDAB" in the last 72 hours. Anemia Panel: No results for input(s): "VITAMINB12", "FOLATE", "FERRITIN", "TIBC", "IRON", "RETICCTPCT" in the last 72 hours. Sepsis Labs: No results for input(s): "PROCALCITON", "LATICACIDVEN" in the last 168 hours.  No results found for this or any previous visit (from the past 240 hour(s)).   Radiology Studies: DG Abd Portable 1V  Result Date: 09/28/2022 CLINICAL DATA:  Encounter for nasogastric tube placement. EXAM: PORTABLE ABDOMEN - 1 VIEW COMPARISON:  None Available. FINDINGS: NG tube with side port and distal tip projecting over the gastric fundus. Partially imaged abdomen demonstrate multiple dilated small bowel loops concerning for ileus and/or obstruction. IMPRESSION: NG tube with side port and distal tip  projecting over the gastric fundus. Electronically Signed   By: Keane Police D.O.   On: 09/28/2022 23:20   CT Abdomen Pelvis W Contrast  Result Date: 09/28/2022 CLINICAL DATA:  Abdomen pain vomiting EXAM: CT ABDOMEN AND PELVIS WITH CONTRAST TECHNIQUE: Multidetector CT imaging of the abdomen and pelvis was performed using the standard protocol following bolus administration of intravenous contrast. RADIATION DOSE REDUCTION: This exam was performed according to the departmental dose-optimization program which includes automated exposure control, adjustment of the mA and/or kV according to patient size and/or use of iterative reconstruction technique. CONTRAST:  75m OMNIPAQUE IOHEXOL 300 MG/ML  SOLN COMPARISON:  None Available. FINDINGS: Lower chest: Lung bases are clear.  Small hiatal hernia Hepatobiliary: Status post cholecystectomy. Mild intra and extrahepatic biliary dilatation likely due to surgical change. Pancreas: Unremarkable. No pancreatic ductal dilatation or surrounding inflammatory changes. Spleen: Normal in size without focal abnormality. Adrenals/Urinary Tract: Adrenal glands are normal. Kidneys show no hydronephrosis. 4 mm nonobstructing left kidney stone. The bladder is unremarkable. Stomach/Bowel: Mild fluid distension of the stomach. Ligament of Treitz does not definitively cross midline and is slightly low in position. Multiple fluid-filled dilated loops of small bowel in the left abdomen measuring up to 3.8 cm without well-defined transition point. Terminal ileum is also fluid-filled and dilated. No acute bowel wall thickening. The colon is decompressed. Sigmoid colon diverticular disease. Vascular/Lymphatic: Moderate aortic atherosclerosis. No aneurysm. No suspicious lymph nodes Reproductive: Uterus and bilateral adnexa are unremarkable. Other: No free air.  Small free fluid in the pelvis. Musculoskeletal: No acute osseous abnormality. IMPRESSION: 1. Multiple fluid-filled dilated loops of  small bowel with overall appearance suspicious for a bowel obstruction transition point not well-defined but possibly at the distal ileum. Negative for intramural air or free air. Small volume free fluid in the pelvis. 2. Diverticular disease of the left colon without acute wall thickening 3. Slightly low lying duodenal jejunal junction which does not completely cross the midline suggesting malrotation but no findings to suggest volvulus as cause for the bowel obstruction. 4. Nonobstructing left kidney stone Electronically Signed   By: KDonavan FoilM.D.   On: 09/28/2022 21:47    Scheduled Meds:  diatrizoate meglumine-sodium  90 mL Per NG tube Once   enoxaparin (LOVENOX) injection  40 mg Subcutaneous Q24H   sodium chloride flush  3 mL Intravenous Q12H   Continuous Infusions:  lactated ringers 100 mL (09/29/22 0237)    LOS: 0 days   ORaiford Noble DO [facility] Available via Epic secure chat 7am-7pm After these hours, please refer to coverage provider listed on [email or web address] 09/29/2022, 10:04 AM

## 2022-09-29 NOTE — Progress Notes (Signed)
  Transition of Care [facility]) Screening Note   Patient Details  Name: Kelly Dixon Date of Birth: Feb 09, 1946   Transition of Care [facility]) CM/SW Contact:    Vassie Moselle, LCSW Phone Number: 09/29/2022, 11:07 AM    Transition of Care Department [facility]) has reviewed patient and no TOC needs have been identified at this time. We will continue to monitor patient advancement through interdisciplinary progression rounds. If new patient transition needs arise, please place a TOC consult.

## 2022-09-29 NOTE — Consult Note (Signed)
Reason for Consult:sbo Referring Physician: Dr Salvadore Dom Kelly Dixon is an 75 y.o. female.  HPI: 66 yof with history breast cncer, htn, s/p lap chole and btl for ab surgery presents with ab pain, n/v.  She had episode a couple weeks ago that she thought was food poisoning that lasted four days but resolved on own.  Is utd on CSC.  Yesterday had sudden onset of abd pain with n/v. She is not passing flatus or stool.  She was admitted to [facility] long with ng placement and she feels better when I see her. She still has no bowel function.   CT showed dilated sb with no real transition point.    Past Medical History:  Diagnosis Date   Anemia    Arthritis    Breast cancer ([place]) 2020   right breast   Gallstones 05/31/2018   GERD (gastroesophageal reflux disease)    Hypertension    Personal history of radiation therapy    PONV (postoperative nausea and vomiting)    Syphilis     Past Surgical History:  Procedure Laterality Date   BREAST BIOPSY Right 08/2019   BREAST LUMPECTOMY Right 08/2019   BREAST LUMPECTOMY WITH RADIOACTIVE SEED AND SENTINEL LYMPH NODE BIOPSY Right 09/15/2019   Procedure: RIGHT BREAST LUMPECTOMY WITH RADIOACTIVE SEED AND SENTINEL LYMPH NODE BIOPSY;  Surgeon: Donnie Mesa, MD;  Location: [place];  Service: General;  Laterality: Right;   CHOLECYSTECTOMY N/A 04/13/2019   Procedure: LAPAROSCOPIC CHOLECYSTECTOMY WITH INTRAOPERATIVE CHOLANGIOGRAM;  Surgeon: Donnie Mesa, MD;  Location: [place];  Service: General;  Laterality: N/A;   DILATION AND CURETTAGE OF UTERUS     TOTAL KNEE ARTHROPLASTY Left 03/26/2022   Procedure: LEFT TOTAL KNEE ARTHROPLASTY;  Surgeon: Mcarthur Rossetti, MD;  Location: [place];  Service: Orthopedics;  Laterality: Left;    Family History  Problem Relation Age of Onset   Arthritis Mother    Hearing loss Mother    Heart disease Father    Arthritis Sister    Colon cancer Neg Hx    Colon polyps Neg Hx    Esophageal cancer Neg Hx     Rectal cancer Neg Hx    Stomach cancer Neg Hx     Social History:  reports that she quit smoking about 2 years ago. Her smoking use included cigarettes. She smoked an average of .5 packs per day. She has never used smokeless tobacco. She reports current alcohol use. She reports that she does not use drugs.  Allergies: No Known Allergies  Current Facility-Administered Medications  Medication Dose Route Frequency Provider Last Rate Last Admin   acetaminophen (TYLENOL) tablet 650 mg  650 mg Oral Q6H PRN Opyd, Ilene Qua, MD       Or   acetaminophen (TYLENOL) suppository 650 mg  650 mg Rectal Q6H PRN Opyd, Ilene Qua, MD       diatrizoate meglumine-sodium (GASTROGRAFIN) 66-10 % solution 90 mL  90 mL Per NG tube Once Rolm Bookbinder, MD       enoxaparin (LOVENOX) injection 40 mg  40 mg Subcutaneous Q24H Opyd, Ilene Qua, MD       HYDROmorphone (DILAUDID) injection 0.5 mg  0.5 mg Intravenous Q3H PRN Opyd, Ilene Qua, MD       labetalol (NORMODYNE) injection 10 mg  10 mg Intravenous Q6H PRN Opyd, Ilene Qua, MD       lactated ringers infusion   Intravenous Continuous Opyd, Ilene Qua, MD 100 mL/hr at 09/29/22 0237 100 mL  at 09/29/22 0237   ondansetron (ZOFRAN) tablet 4 mg  4 mg Oral Q6H PRN Opyd, Ilene Qua, MD       Or   ondansetron (ZOFRAN) injection 4 mg  4 mg Intravenous Q6H PRN Opyd, Ilene Qua, MD       sodium chloride flush (NS) 0.9 % injection 3 mL  3 mL Intravenous Q12H Opyd, Ilene Qua, MD   3 mL at 09/29/22 0119     Results for orders placed or performed during the hospital encounter of 09/28/22 (from the past 48 hour(s))  Urinalysis, Routine w reflex microscopic Urine, Clean Catch     Status: Abnormal   Collection Time: 09/28/22  7:30 PM  Result Value Ref Range   Color, Urine YELLOW YELLOW   APPearance HAZY (A) CLEAR   Specific Gravity, Urine 1.025 1.005 - 1.030   pH 5.5 5.0 - 8.0   Glucose, UA NEGATIVE NEGATIVE mg/dL   Hgb urine dipstick TRACE (A) NEGATIVE   Bilirubin Urine  NEGATIVE NEGATIVE   Ketones, ur NEGATIVE NEGATIVE mg/dL   Protein, ur NEGATIVE NEGATIVE mg/dL   Nitrite NEGATIVE NEGATIVE   Leukocytes,Ua MODERATE (A) NEGATIVE    Comment: Performed at [facility], [address] [place], [place] [address]  Urinalysis, Microscopic (reflex)     Status: Abnormal   Collection Time: 09/28/22  7:30 PM  Result Value Ref Range   RBC / HPF 0-5 0 - 5 RBC/hpf   WBC, UA 21-50 0 - 5 WBC/hpf   Bacteria, UA MANY (A) NONE SEEN   Squamous Epithelial / LPF 11-20 0 - 5 /HPF   Non Squamous Epithelial PRESENT (A) NONE SEEN   Mucus PRESENT    Granular Casts, UA PRESENT     Comment: Performed at [facility], [address] [place], [place] [address]  Comprehensive metabolic panel     Status: Abnormal   Collection Time: 09/28/22  8:10 PM  Result Value Ref Range   Sodium 139 135 - 145 mmol/L   Potassium 3.6 3.5 - 5.1 mmol/L   Chloride 105 98 - 111 mmol/L   CO2 26 22 - 32 mmol/L   Glucose, Bld 146 (H) 70 - 99 mg/dL    Comment: Glucose reference range applies only to samples taken after fasting for at least 8 hours.   BUN 11 8 - 23 mg/dL   Creatinine, Ser 0.74 0.44 - 1.00 mg/dL   Calcium 9.2 8.9 - 10.3 mg/dL   Total Protein 8.0 6.5 - 8.1 g/dL   Albumin 4.3 3.5 - 5.0 g/dL   AST 15 15 - 41 U/L   ALT 9 0 - 44 U/L   Alkaline Phosphatase 67 38 - 126 U/L   Total Bilirubin 0.4 0.3 - 1.2 mg/dL   GFR, Estimated >60 >60 mL/min    Comment: (NOTE) Calculated using the CKD-EPI Creatinine Equation (2021)    Anion gap 8 5 - 15    Comment: Performed at [facility], [address] [place], [place] [address]  Lipase, blood     Status: None   Collection Time: 09/28/22  8:10 PM  Result Value Ref Range   Lipase 33 11 - 51 U/L    Comment: Performed at [facility], [address] [place], [place] [address]  CBC with Differential     Status: Abnormal   Collection Time: 09/28/22  8:10 PM  Result Value Ref Range   WBC 9.7 4.0 -  10.5  K/uL   RBC 4.56 3.87 - 5.11 MIL/uL   Hemoglobin 13.7 12.0 - 15.0 g/dL   HCT 41.5 36.0 - 46.0 %   MCV 91.0 80.0 - 100.0 fL   MCH 30.0 26.0 - 34.0 pg   MCHC 33.0 30.0 - 36.0 g/dL   RDW 13.0 11.5 - 15.5 %   Platelets 227 150 - 400 K/uL   nRBC 0.0 0.0 - 0.2 %   Neutrophils Relative % 81 %   Neutro Abs 7.8 (H) 1.7 - 7.7 K/uL   Lymphocytes Relative 10 %   Lymphs Abs 0.9 0.7 - 4.0 K/uL   Monocytes Relative 6 %   Monocytes Absolute 0.6 0.1 - 1.0 K/uL   Eosinophils Relative 3 %   Eosinophils Absolute 0.3 0.0 - 0.5 K/uL   Basophils Relative 0 %   Basophils Absolute 0.0 0.0 - 0.1 K/uL   Immature Granulocytes 0 %   Abs Immature Granulocytes 0.03 0.00 - 0.07 K/uL    Comment: Performed at [facility], [address] [place], [place] [address]  CBG monitoring, ED     Status: Abnormal   Collection Time: 09/28/22  8:29 PM  Result Value Ref Range   Glucose-Capillary 145 (H) 70 - 99 mg/dL    Comment: Glucose reference range applies only to samples taken after fasting for at least 8 hours.  Basic metabolic panel     Status: Abnormal   Collection Time: 09/29/22  7:05 AM  Result Value Ref Range   Sodium 138 135 - 145 mmol/L   Potassium 3.9 3.5 - 5.1 mmol/L   Chloride 106 98 - 111 mmol/L   CO2 26 22 - 32 mmol/L   Glucose, Bld 113 (H) 70 - 99 mg/dL    Comment: Glucose reference range applies only to samples taken after fasting for at least 8 hours.   BUN 10 8 - 23 mg/dL   Creatinine, Ser 0.69 0.44 - 1.00 mg/dL   Calcium 8.5 (L) 8.9 - 10.3 mg/dL   GFR, Estimated >60 >60 mL/min    Comment: (NOTE) Calculated using the CKD-EPI Creatinine Equation (2021)    Anion gap 6 5 - 15    Comment: Performed at [facility], [place] [address], [place], [place] [address]  Magnesium     Status: None   Collection Time: 09/29/22  7:05 AM  Result Value Ref Range   Magnesium 2.2 1.7 - 2.4 mg/dL    Comment: Performed at [facility], [place] [address],  [place], [place] [address]  CBC     Status: Abnormal   Collection Time: 09/29/22  7:05 AM  Result Value Ref Range   WBC 6.4 4.0 - 10.5 K/uL   RBC 3.91 3.87 - 5.11 MIL/uL   Hemoglobin 11.8 (L) 12.0 - 15.0 g/dL   HCT 37.0 36.0 - 46.0 %   MCV 94.6 80.0 - 100.0 fL   MCH 30.2 26.0 - 34.0 pg   MCHC 31.9 30.0 - 36.0 g/dL   RDW 13.3 11.5 - 15.5 %   Platelets 203 150 - 400 K/uL   nRBC 0.0 0.0 - 0.2 %    Comment: Performed at [facility], [place] [address], [place], [place] [address]    DG Abd Portable 1V  Result Date: 09/28/2022 CLINICAL DATA:  Encounter for nasogastric tube placement. EXAM: PORTABLE ABDOMEN - 1 VIEW COMPARISON:  None Available. FINDINGS: NG tube with side port and distal tip projecting over the gastric  fundus. Partially imaged abdomen demonstrate multiple dilated small bowel loops concerning for ileus and/or obstruction. IMPRESSION: NG tube with side port and distal tip projecting over the gastric fundus. Electronically Signed   By: Keane Police D.O.   On: 09/28/2022 23:20   CT Abdomen Pelvis W Contrast  Result Date: 09/28/2022 CLINICAL DATA:  Abdomen pain vomiting EXAM: CT ABDOMEN AND PELVIS WITH CONTRAST TECHNIQUE: Multidetector CT imaging of the abdomen and pelvis was performed using the standard protocol following bolus administration of intravenous contrast. RADIATION DOSE REDUCTION: This exam was performed according to the departmental dose-optimization program which includes automated exposure control, adjustment of the mA and/or kV according to patient size and/or use of iterative reconstruction technique. CONTRAST:  43m OMNIPAQUE IOHEXOL 300 MG/ML  SOLN COMPARISON:  None Available. FINDINGS: Lower chest: Lung bases are clear.  Small hiatal hernia Hepatobiliary: Status post cholecystectomy. Mild intra and extrahepatic biliary dilatation likely due to surgical change. Pancreas: Unremarkable. No pancreatic ductal dilatation or surrounding inflammatory changes.  Spleen: Normal in size without focal abnormality. Adrenals/Urinary Tract: Adrenal glands are normal. Kidneys show no hydronephrosis. 4 mm nonobstructing left kidney stone. The bladder is unremarkable. Stomach/Bowel: Mild fluid distension of the stomach. Ligament of Treitz does not definitively cross midline and is slightly low in position. Multiple fluid-filled dilated loops of small bowel in the left abdomen measuring up to 3.8 cm without well-defined transition point. Terminal ileum is also fluid-filled and dilated. No acute bowel wall thickening. The colon is decompressed. Sigmoid colon diverticular disease. Vascular/Lymphatic: Moderate aortic atherosclerosis. No aneurysm. No suspicious lymph nodes Reproductive: Uterus and bilateral adnexa are unremarkable. Other: No free air.  Small free fluid in the pelvis. Musculoskeletal: No acute osseous abnormality. IMPRESSION: 1. Multiple fluid-filled dilated loops of small bowel with overall appearance suspicious for a bowel obstruction transition point not well-defined but possibly at the distal ileum. Negative for intramural air or free air. Small volume free fluid in the pelvis. 2. Diverticular disease of the left colon without acute wall thickening 3. Slightly low lying duodenal jejunal junction which does not completely cross the midline suggesting malrotation but no findings to suggest volvulus as cause for the bowel obstruction. 4. Nonobstructing left kidney stone Electronically Signed   By: KDonavan FoilM.D.   On: 09/28/2022 21:47    Review of Systems  Constitutional:  Negative for fever.  Gastrointestinal:  Positive for abdominal distention, abdominal pain, nausea and vomiting.  All other systems reviewed and are negative.  Blood pressure 113/67, pulse 72, temperature 100.1 F (37.8 C), temperature source Oral, resp. rate 18, height '5\' 2"'$  (1.575 m), weight 87.2 kg, SpO2 95 %. Physical Exam Vitals reviewed.  Constitutional:      Appearance: She is  well-developed.  Cardiovascular:     Rate and Rhythm: Normal rate and regular rhythm.  Pulmonary:     Effort: Pulmonary effort is normal.  Abdominal:     Palpations: Abdomen is soft.     Tenderness: There is no abdominal tenderness.     Hernia: No hernia is present.  Skin:    General: Skin is warm and dry.     Capillary Refill: Capillary refill takes less than 2 seconds.  Neurological:     Mental Status: She is alert.  Psychiatric:        Mood and Affect: Mood normal.        Behavior: Behavior normal.     Assessment/Plan: SBO -no current indication for surgery -ng in place, will start sb protocol and  hopefully resolve on own -pulm toilet, oob, ambulate -pharm dvt prophylaxis is fine from surgery standpoint  I spent 42 minutes seeing patient, reviewing data. I reviewed vitals, labs, as well as independently reviewing the CT scan. Discussed with ER when consulted.   Rolm Bookbinder 09/29/2022, 9:22 AM

## 2022-09-30 DIAGNOSIS — K56609 Unspecified intestinal obstruction, unspecified as to partial versus complete obstruction: Secondary | ICD-10-CM | POA: Diagnosis not present

## 2022-09-30 DIAGNOSIS — I10 Essential (primary) hypertension: Secondary | ICD-10-CM | POA: Diagnosis not present

## 2022-09-30 LAB — COMPREHENSIVE METABOLIC PANEL
ALT: 11 U/L (ref 0–44)
AST: 13 U/L — ABNORMAL LOW (ref 15–41)
Albumin: 4.1 g/dL (ref 3.5–5.0)
Alkaline Phosphatase: 56 U/L (ref 38–126)
Anion gap: 9 (ref 5–15)
BUN: 7 mg/dL — ABNORMAL LOW (ref 8–23)
CO2: 25 mmol/L (ref 22–32)
Calcium: 8.9 mg/dL (ref 8.9–10.3)
Chloride: 104 mmol/L (ref 98–111)
Creatinine, Ser: 0.63 mg/dL (ref 0.44–1.00)
GFR, Estimated: >60 mL/min (ref >60)
Glucose, Bld: 105 mg/dL — ABNORMAL HIGH (ref 70–99)
Potassium: 3.9 mmol/L (ref 3.5–5.1)
Sodium: 138 mmol/L (ref 135–145)
Total Bilirubin: 0.9 mg/dL (ref 0.3–1.2)
Total Protein: 7.2 g/dL (ref 6.5–8.1)

## 2022-09-30 LAB — GLUCOSE, CAPILLARY
Glucose-Capillary: 102 mg/dL — ABNORMAL HIGH (ref 70–99)
Glucose-Capillary: 110 mg/dL — ABNORMAL HIGH (ref 70–99)

## 2022-09-30 LAB — CBC WITH DIFFERENTIAL/PLATELET
Abs Immature Granulocytes: 0.01 10*3/uL (ref 0.00–0.07)
Basophils Absolute: 0 10*3/uL (ref 0.0–0.1)
Basophils Relative: 1 %
Eosinophils Absolute: 0.3 10*3/uL (ref 0.0–0.5)
Eosinophils Relative: 6 %
HCT: 38.2 % (ref 36.0–46.0)
Hemoglobin: 12.3 g/dL (ref 12.0–15.0)
Immature Granulocytes: 0 %
Lymphocytes Relative: 20 %
Lymphs Abs: 1.1 10*3/uL (ref 0.7–4.0)
MCH: 30.5 pg (ref 26.0–34.0)
MCHC: 32.2 g/dL (ref 30.0–36.0)
MCV: 94.8 fL (ref 80.0–100.0)
Monocytes Absolute: 0.5 10*3/uL (ref 0.1–1.0)
Monocytes Relative: 9 %
Neutro Abs: 3.6 10*3/uL (ref 1.7–7.7)
Neutrophils Relative %: 64 %
Platelets: 192 10*3/uL (ref 150–400)
RBC: 4.03 MIL/uL (ref 3.87–5.11)
RDW: 12.6 % (ref 11.5–15.5)
WBC: 5.5 10*3/uL (ref 4.0–10.5)
nRBC: 0 % (ref 0.0–0.2)

## 2022-09-30 LAB — PHOSPHORUS: Phosphorus: 3.6 mg/dL (ref 2.5–4.6)

## 2022-09-30 LAB — IRON AND TIBC
Iron: 45 ug/dL (ref 28–170)
Saturation Ratios: 13 % (ref 10.4–31.8)
TIBC: 349 ug/dL (ref 250–450)
UIBC: 304 ug/dL

## 2022-09-30 LAB — FERRITIN: Ferritin: 38 ng/mL (ref 11–307)

## 2022-09-30 LAB — VITAMIN B12: Vitamin B-12: 148 pg/mL — ABNORMAL LOW (ref 180–914)

## 2022-09-30 LAB — FOLATE: Folate: 15.1 ng/mL (ref >5.9)

## 2022-09-30 LAB — RETICULOCYTES
Immature Retic Fract: 9.3 % (ref 2.3–15.9)
RBC.: 4.06 MIL/uL (ref 3.87–5.11)
Retic Count, Absolute: 47.5 10*3/uL (ref 19.0–186.0)
Retic Ct Pct: 1.2 % (ref 0.4–3.1)

## 2022-09-30 LAB — MAGNESIUM: Magnesium: 2 mg/dL (ref 1.7–2.4)

## 2022-09-30 MED ORDER — CYANOCOBALAMIN 1000 MCG/ML IJ SOLN
1000.0000 ug | Freq: Once | INTRAMUSCULAR | Status: AC
  Administered 2022-09-30: 1000 ug via INTRAMUSCULAR
  Filled 2022-09-30: qty 1

## 2022-09-30 MED ORDER — ONDANSETRON HCL 4 MG PO TABS: 4.0000 mg | ORAL_TABLET | Freq: Four times a day (QID) | ORAL | 0 refills | Status: DC | PRN

## 2022-09-30 MED ORDER — VITAMIN B-12 1000 MCG PO TABS: 1000.0000 ug | ORAL_TABLET | Freq: Every day | ORAL | Status: DC

## 2022-09-30 MED ORDER — CYANOCOBALAMIN 1000 MCG PO TABS: 1000.0000 ug | ORAL_TABLET | Freq: Every day | ORAL | 0 refills | Status: DC

## 2022-09-30 NOTE — Progress Notes (Signed)
Assessment & Plan: HD#2 - small bowel obstruction  Protocol film with contrast throughout colon  Patient having BM's, mild abd pain  Will discontinue NG tube and start clear liquid diet this AM  May advance diet for lunch and if tolerated, OK for discharge later today        Armandina Gemma, MD [facility] practice Office: [phone]        Chief Complaint: SBO  Subjective: Patient in bed, mild abd pain.  Having BM's, passing flatus.  Objective: Vital signs in last 24 hours: Temp:  [97.5 F (36.4 C)] 97.5 F (36.4 C) (12/31 1240) Pulse Rate:  [65] 65 (12/31 1240) Resp:  [18] 18 (12/31 1240) BP: (141)/(76) 141/76 (12/31 1240) SpO2:  [97 %] 97 % (12/31 1240)    Intake/Output from previous day: No intake/output data recorded. Intake/Output this shift: No intake/output data recorded.  Physical Exam: HEENT - sclerae clear, mucous membranes moist Neck - soft Abdomen - soft without distension; mild tenderness Ext - no edema, non-tender Neuro - alert & oriented, no focal deficits  Lab Results:  Recent Labs    09/29/22 0705 09/30/22 0817  WBC 6.4 5.5  HGB 11.8* 12.3  HCT 37.0 38.2  PLT 203 192   BMET Recent Labs    09/28/22 2010 09/29/22 0705  NA 139 138  K 3.6 3.9  CL 105 106  CO2 26 26  GLUCOSE 146* 113*  BUN 11 10  CREATININE 0.74 0.69  CALCIUM 9.2 8.5*   PT/INR No results for input(s): "LABPROT", "INR" in the last 72 hours. Comprehensive Metabolic Panel:    Component Value Date/Time   NA 138 09/29/2022 0705   NA 139 09/28/2022 2010   K 3.9 09/29/2022 0705   K 3.6 09/28/2022 2010   CL 106 09/29/2022 0705   CL 105 09/28/2022 2010   CO2 26 09/29/2022 0705   CO2 26 09/28/2022 2010   BUN 10 09/29/2022 0705   BUN 11 09/28/2022 2010   CREATININE 0.69 09/29/2022 0705   CREATININE 0.74 09/28/2022 2010   CREATININE 0.79 07/22/2019 0823   CREATININE 0.79 05/27/2018 1205   GLUCOSE 113 (H) 09/29/2022 0705   GLUCOSE 146  (H) 09/28/2022 2010   CALCIUM 8.5 (L) 09/29/2022 0705   CALCIUM 9.2 09/28/2022 2010   AST 15 09/28/2022 2010   AST 11 05/10/2022 0935   ALT 9 09/28/2022 2010   ALT 7 05/10/2022 0935   ALKPHOS 67 09/28/2022 2010   ALKPHOS 68 05/10/2022 0935   BILITOT 0.4 09/28/2022 2010   BILITOT 0.4 05/10/2022 0935   PROT 8.0 09/28/2022 2010   PROT 6.7 05/10/2022 0935   ALBUMIN 4.3 09/28/2022 2010   ALBUMIN 4.3 05/10/2022 0935    Studies/Results: DG Abd Portable 1V-Small Bowel Obstruction Protocol-initial, 8 hr delay  Result Date: 09/29/2022 CLINICAL DATA:  8 hour delay for small bowel obstruction. EXAM: PORTABLE ABDOMEN - 1 VIEW COMPARISON:  Upper abdominal radiograph 09/28/2022, CT abdomen and pelvis 09/28/2022 FINDINGS: Oral contrast is now seen throughout the ascending, transverse, and descending colon. No significant oral contrast within the small bowel. Air is seen within nondistended loops of small bowel. No portal venous gas or pneumatosis. Enteric tube tip overlies the stomach in left upper quadrant. Cholecystectomy clips. Evaluation for pneumoperitoneum is limited by supine technique, however none is identified. No acute skeletal abnormality. IMPRESSION: Oral contrast is now seen throughout the ascending, transverse, and descending colon. No significant oral contrast within the small bowel. No dilated  loops of bowel to suggest obstruction. Note is made of diffuse dilatation of small bowel loops on 09/28/2022 CT. Electronically Signed   By: Yvonne Kendall M.D.   On: 09/29/2022 19:30   DG Abd Portable 1V  Result Date: 09/28/2022 CLINICAL DATA:  Encounter for nasogastric tube placement. EXAM: PORTABLE ABDOMEN - 1 VIEW COMPARISON:  None Available. FINDINGS: NG tube with side port and distal tip projecting over the gastric fundus. Partially imaged abdomen demonstrate multiple dilated small bowel loops concerning for ileus and/or obstruction. IMPRESSION: NG tube with side port and distal tip projecting  over the gastric fundus. Electronically Signed   By: Keane Police D.O.   On: 09/28/2022 23:20   CT Abdomen Pelvis W Contrast  Result Date: 09/28/2022 CLINICAL DATA:  Abdomen pain vomiting EXAM: CT ABDOMEN AND PELVIS WITH CONTRAST TECHNIQUE: Multidetector CT imaging of the abdomen and pelvis was performed using the standard protocol following bolus administration of intravenous contrast. RADIATION DOSE REDUCTION: This exam was performed according to the departmental dose-optimization program which includes automated exposure control, adjustment of the mA and/or kV according to patient size and/or use of iterative reconstruction technique. CONTRAST:  26m OMNIPAQUE IOHEXOL 300 MG/ML  SOLN COMPARISON:  None Available. FINDINGS: Lower chest: Lung bases are clear.  Small hiatal hernia Hepatobiliary: Status post cholecystectomy. Mild intra and extrahepatic biliary dilatation likely due to surgical change. Pancreas: Unremarkable. No pancreatic ductal dilatation or surrounding inflammatory changes. Spleen: Normal in size without focal abnormality. Adrenals/Urinary Tract: Adrenal glands are normal. Kidneys show no hydronephrosis. 4 mm nonobstructing left kidney stone. The bladder is unremarkable. Stomach/Bowel: Mild fluid distension of the stomach. Ligament of Treitz does not definitively cross midline and is slightly low in position. Multiple fluid-filled dilated loops of small bowel in the left abdomen measuring up to 3.8 cm without well-defined transition point. Terminal ileum is also fluid-filled and dilated. No acute bowel wall thickening. The colon is decompressed. Sigmoid colon diverticular disease. Vascular/Lymphatic: Moderate aortic atherosclerosis. No aneurysm. No suspicious lymph nodes Reproductive: Uterus and bilateral adnexa are unremarkable. Other: No free air.  Small free fluid in the pelvis. Musculoskeletal: No acute osseous abnormality. IMPRESSION: 1. Multiple fluid-filled dilated loops of small bowel  with overall appearance suspicious for a bowel obstruction transition point not well-defined but possibly at the distal ileum. Negative for intramural air or free air. Small volume free fluid in the pelvis. 2. Diverticular disease of the left colon without acute wall thickening 3. Slightly low lying duodenal jejunal junction which does not completely cross the midline suggesting malrotation but no findings to suggest volvulus as cause for the bowel obstruction. 4. Nonobstructing left kidney stone Electronically Signed   By: KDonavan FoilM.D.   On: 09/28/2022 21:47      TArmandina Gemma1/09/2022  Patient ID: JLisa Dixon female   DOB: [birth year] 77y.o.   MRN: [id number]

## 2022-09-30 NOTE — Progress Notes (Signed)
Mobility Specialist - Progress Note   09/30/22 1458  Mobility  Activity Ambulated independently in hallway  Level of Assistance Independent  Assistive Device None  Distance Ambulated (ft) 500 ft  Activity Response Tolerated well  Mobility Referral Yes  $Mobility charge 1 Mobility   Pt received in bed and agreeable to mobility. No complaints during mobility & actually stated she feels a lot better & is ready to go home. Pt to bench after session with all needs met.    [facility]

## 2022-09-30 NOTE — Discharge Summary (Signed)
Physician Discharge Summary   Patient: Kelly Dixon MRN: [id number] DOB: October 26, 1945  Admit date:     09/28/2022  Discharge date: 09/30/22  Discharge Physician: Raiford Noble, DO   PCP: Shelda Pal, DO   Recommendations at discharge:   Follow up with PCP within 1-2 weeks and repeat CBC,CMP, Mag, Phos Follow up with General Surgery within 1-2 weeks  Discharge Diagnoses: Principal Problem:   SBO (small bowel obstruction) ([place]) Active Problems:   Essential hypertension  Resolved Problems:   * No resolved hospital problems. [facility] Course: HPI per Dr. Mitzi Hansen on 09/28/22 Kelly Dixon is a pleasant 77 y.o. female with medical history significant for breast cancer, hypertension, laparoscopic cholecystectomy, and remote tubal ligation who presents to the emergency department with abdominal pain, nausea, and vomiting.   Patient reports that she been in her usual state of health until approximately 4 PM today when she developed acute onset of pain across the mid abdomen, nausea, and nonbloody vomiting.  She had a similar episode approximately 3 weeks ago after eating pizza, but notes that the person she was eating the pizza with also developed nausea and vomiting then.   [facility] ED Course: Upon arrival to the ED, patient is found to be afebrile and saturating in the 90s on room air with transient tachycardia and stable blood pressure.  CMP and CBC were unremarkable and lipase was normal.  CT of the abdomen and pelvis demonstrates multiple fluid-filled loops of small bowel suspicious for SBO.   Surgery was consulted by the ED PA, NG tube was placed, and the patient was given a liter of normal saline, morphine, Reglan, and Zofran.  She was transferred to [facility] for admission.   **Interim History -Patient had an NG tube placed and surgery was consulted and have initiated small bowel protocol and recommending pulmonary toileting and out of bed with assistance and  feels there is no current surgical indication at this time.  Steadily improved and her NG was removed and she was deemed stable for discharge given that she was having bowel movements and tolerating her diet without issues.  Assessment and Plan:  SBO with associated abdominal pain, nausea vomiting, improved  -Continue NPO with NGT decompression but she had Tube Removed this Am and placed on CLD and tolerated it well and so this was advanced to soft and she tolerated without issues and general surgery felt that she could be discharged safely home -Small bowel protocol and surgical consultation feel that this will hopefully resolve on its own no current indication for surgery at this time -Continue with IVF hydration with lactated Ringer's at 100 MLS per hour, optimize electrolytes,  -History of breast cancer and a history of laparoscopic cholecystectomy as well as remote tubal ligation -Surgery consulted and will Follow-up surgery recommendations as patient is undergoing Small Bowel Protocol and this was done -General Surgery recommending Advancing Diet as Tolerated and patient can be D/C'd Home from their persepective given that she tolerated a soft diet without issues -Follow up with PCP and General Surgery within 1-2 weeks    HTN  -Treat as-needed only for now with IV labetalol 10 mg every 6h for systolic blood pressure greater than 403 or diastolic blood pressures in the 100 -Continue to Monitor BP Per Protocol and Last BP reading was 121/77   Normocytic Anemia -Hgb/Hct Trend: Recent Labs  Lab 09/28/22 2010 09/29/22 0705 09/30/22 0817  HGB 13.7 11.8* 12.3  HCT 41.5 37.0 38.2  MCV 91.0 94.6 94.8  -Checked Anemia Panel and showed an iron level of 45, UIBC 304, TIBC 349, saturation ratio of 13%, ferritin level 38, folate level 15.1 and vitamin B12 level of 148; Likely Dilutional Drop -Will supplement B12 and start oral Supplementation in the AM  -Continue to Monitor for S/Sx of Bleeding;  No overt bleeding noted -Repeat CBC in the AM   Hyperglycemia -Check HbA1c in the AM in the outpatient setting  -Blood Sugars ranging from 105-146 on Daily BMP/CMP -If necessary will place on Sensitive Novology SSI Insulin   History of breast cancer -Has had right-sided breast cancer with a breast lumpectomy with radioactive seed and sentinel lymph node biopsy   Obesity -Complicates overall prognosis and care -Estimated body mass index is 35.16 kg/m as calculated from the following:   Height as of this encounter: '5\' 2"'$  (1.575 m).   Weight as of this encounter: 87.2 kg.  -Weight Loss and Dietary Counseling given  Consultants: General Surgery  Procedures performed: None  Disposition: Home Diet recommendation:  Regular diet -SOFT  DISCHARGE MEDICATION: Allergies as of 09/30/2022   No Known Allergies      Medication List     STOP taking these medications    Tylenol 325 MG tablet Generic drug: acetaminophen       TAKE these medications    amLODipine 5 MG tablet Commonly known as: NORVASC TAKE 1 TABLET(5 MG) BY MOUTH DAILY What changed: See the new instructions.   cyanocobalamin 1000 MCG tablet Take 1 tablet (1,000 mcg total) by mouth daily. Start taking on: October 01, 2022   ferrous sulfate 325 (65 FE) MG EC tablet Take 1 tablet (325 mg total) by mouth daily with breakfast.   fexofenadine 180 MG tablet Commonly known as: ALLEGRA Take 180 mg by mouth daily.   ondansetron 4 MG tablet Commonly known as: ZOFRAN Take 1 tablet (4 mg total) by mouth every 6 (six) hours as needed for nausea.       Discharge Exam: Filed Weights   09/28/22 1922 09/29/22 0500  Weight: 88 kg 87.2 kg   Vitals:   09/29/22 1240 09/30/22 1232  BP: (!) 141/76 121/77  Pulse: 65 77  Resp: 18 20  Temp: (!) 97.5 F (36.4 C) 98.3 F (36.8 C)  SpO2: 97% 96%   Examination: Physical Exam:  Constitutional: WN/WD obese Caucasian female currently no acute distress Respiratory:  Diminished to auscultation bilaterally, no wheezing, rales, rhonchi or crackles. Normal respiratory effort and patient is not tachypenic. No accessory muscle use.  Unlabored breathing Cardiovascular: RRR, no murmurs / rubs / gallops. S1 and S2 auscultated.  No appreciable extremity edema Abdomen: Soft, mildly-tender, distended very body habitus. Bowel sounds positive.  GU: Deferred. Musculoskeletal: No clubbing / cyanosis of digits/nails. No joint deformity upper and lower extremities.  Skin: No rashes, lesions, ulcers on limited skin evaluation. No induration; Warm and dry.  Neurologic: CN 2-12 grossly intact with no focal deficits. Romberg sign and cerebellar reflexes not assessed.  Psychiatric: Normal judgment and insight. Alert and oriented x 3. Normal mood and appropriate affect.   Condition at discharge: stable  The results of significant diagnostics from this hospitalization (including imaging, microbiology, ancillary and laboratory) are listed below for reference.   Imaging Studies: DG Abd Portable 1V-Small Bowel Obstruction Protocol-initial, 8 hr delay  Result Date: 09/29/2022 CLINICAL DATA:  8 hour delay for small bowel obstruction. EXAM: PORTABLE ABDOMEN - 1 VIEW COMPARISON:  Upper abdominal radiograph 09/28/2022, CT abdomen and pelvis  09/28/2022 FINDINGS: Oral contrast is now seen throughout the ascending, transverse, and descending colon. No significant oral contrast within the small bowel. Air is seen within nondistended loops of small bowel. No portal venous gas or pneumatosis. Enteric tube tip overlies the stomach in left upper quadrant. Cholecystectomy clips. Evaluation for pneumoperitoneum is limited by supine technique, however none is identified. No acute skeletal abnormality. IMPRESSION: Oral contrast is now seen throughout the ascending, transverse, and descending colon. No significant oral contrast within the small bowel. No dilated loops of bowel to suggest obstruction. Note  is made of diffuse dilatation of small bowel loops on 09/28/2022 CT. Electronically Signed   By: Yvonne Kendall M.D.   On: 09/29/2022 19:30   DG Abd Portable 1V  Result Date: 09/28/2022 CLINICAL DATA:  Encounter for nasogastric tube placement. EXAM: PORTABLE ABDOMEN - 1 VIEW COMPARISON:  None Available. FINDINGS: NG tube with side port and distal tip projecting over the gastric fundus. Partially imaged abdomen demonstrate multiple dilated small bowel loops concerning for ileus and/or obstruction. IMPRESSION: NG tube with side port and distal tip projecting over the gastric fundus. Electronically Signed   By: Keane Police D.O.   On: 09/28/2022 23:20   CT Abdomen Pelvis W Contrast  Result Date: 09/28/2022 CLINICAL DATA:  Abdomen pain vomiting EXAM: CT ABDOMEN AND PELVIS WITH CONTRAST TECHNIQUE: Multidetector CT imaging of the abdomen and pelvis was performed using the standard protocol following bolus administration of intravenous contrast. RADIATION DOSE REDUCTION: This exam was performed according to the departmental dose-optimization program which includes automated exposure control, adjustment of the mA and/or kV according to patient size and/or use of iterative reconstruction technique. CONTRAST:  69m OMNIPAQUE IOHEXOL 300 MG/ML  SOLN COMPARISON:  None Available. FINDINGS: Lower chest: Lung bases are clear.  Small hiatal hernia Hepatobiliary: Status post cholecystectomy. Mild intra and extrahepatic biliary dilatation likely due to surgical change. Pancreas: Unremarkable. No pancreatic ductal dilatation or surrounding inflammatory changes. Spleen: Normal in size without focal abnormality. Adrenals/Urinary Tract: Adrenal glands are normal. Kidneys show no hydronephrosis. 4 mm nonobstructing left kidney stone. The bladder is unremarkable. Stomach/Bowel: Mild fluid distension of the stomach. Ligament of Treitz does not definitively cross midline and is slightly low in position. Multiple fluid-filled dilated  loops of small bowel in the left abdomen measuring up to 3.8 cm without well-defined transition point. Terminal ileum is also fluid-filled and dilated. No acute bowel wall thickening. The colon is decompressed. Sigmoid colon diverticular disease. Vascular/Lymphatic: Moderate aortic atherosclerosis. No aneurysm. No suspicious lymph nodes Reproductive: Uterus and bilateral adnexa are unremarkable. Other: No free air.  Small free fluid in the pelvis. Musculoskeletal: No acute osseous abnormality. IMPRESSION: 1. Multiple fluid-filled dilated loops of small bowel with overall appearance suspicious for a bowel obstruction transition point not well-defined but possibly at the distal ileum. Negative for intramural air or free air. Small volume free fluid in the pelvis. 2. Diverticular disease of the left colon without acute wall thickening 3. Slightly low lying duodenal jejunal junction which does not completely cross the midline suggesting malrotation but no findings to suggest volvulus as cause for the bowel obstruction. 4. Nonobstructing left kidney stone Electronically Signed   By: KDonavan FoilM.D.   On: 09/28/2022 21:47   MM 3D SCREEN BREAST BILATERAL  Result Date: 09/25/2022 CLINICAL DATA:  Screening. EXAM: DIGITAL SCREENING BILATERAL MAMMOGRAM WITH TOMOSYNTHESIS AND CAD TECHNIQUE: Bilateral screening digital craniocaudal and mediolateral oblique mammograms were obtained. Bilateral screening digital breast tomosynthesis was performed. The images were  evaluated with computer-aided detection. COMPARISON:  Previous exam(s). ACR Breast Density Category b: There are scattered areas of fibroglandular density. FINDINGS: In the right breast, a possible mass warrants further evaluation. In the left breast, no findings suspicious for malignancy. IMPRESSION: Further evaluation is suggested for a possible mass in the right breast. RECOMMENDATION: Diagnostic mammogram and possibly ultrasound of the right breast.  (Code:[id number]-R-45M) The patient will be contacted regarding the findings, and additional imaging will be scheduled. BI-RADS CATEGORY  0: Incomplete. Need additional imaging evaluation and/or prior mammograms for comparison. Electronically Signed   By: [name] M.D.   On: 09/25/2022 12:36    Microbiology: Results for orders placed or performed during the hospital encounter of 03/19/22  Surgical pcr screen     Status: None   Collection Time: 03/19/22  8:36 AM   Specimen: Nasal Mucosa; Nasal Swab  Result Value Ref Range Status   MRSA, PCR NEGATIVE NEGATIVE Final   Staphylococcus aureus NEGATIVE NEGATIVE Final    Comment: (NOTE) The Xpert SA Assay (FDA approved for NASAL specimens in patients 22 years of age and older), is one component of a comprehensive surveillance program. It is not intended to diagnose infection nor to guide or monitor treatment. Performed at [facility], [place] [address] [place], [place] [address]    Labs: CBC: Recent Labs  Lab 09/28/22 2010 09/29/22 0705 09/30/22 0817  WBC 9.7 6.4 5.5  NEUTROABS 7.8*  --  3.6  HGB 13.7 11.8* 12.3  HCT 41.5 37.0 38.2  MCV 91.0 94.6 94.8  PLT 227 203 604   Basic Metabolic Panel: Recent Labs  Lab 09/28/22 2010 09/29/22 0705 09/30/22 0817  NA 139 138 138  K 3.6 3.9 3.9  CL 105 106 104  CO2 '26 26 25  '$ GLUCOSE 146* 113* 105*  BUN 11 10 7*  CREATININE 0.74 0.69 0.63  CALCIUM 9.2 8.5* 8.9  MG  --  2.2 2.0  PHOS  --   --  3.6   Liver Function Tests: Recent Labs  Lab 09/28/22 2010 09/30/22 0817  AST 15 13*  ALT 9 11  ALKPHOS 67 56  BILITOT 0.4 0.9  PROT 8.0 7.2  ALBUMIN 4.3 4.1   CBG: Recent Labs  Lab 09/29/22 1123 09/29/22 1529 09/29/22 2134 09/30/22 0130 09/30/22 1127  GLUCAP 114* 105* 101* 102* 110*   Discharge time spent: greater than 30 minutes.  Signed: Raiford Noble, DO [facility] 09/30/2022

## 2022-10-01 ENCOUNTER — Telehealth: Payer: Self-pay

## 2022-10-01 NOTE — Patient Outreach (Signed)
  Care Coordination TOC Note Transition Care Management Unsuccessful Follow-up Telephone Call  Date of discharge and from where:  [facility] 09/29/22-09/30/22  Attempts:  1st Attempt  Reason for unsuccessful TCM follow-up call:  Unable to leave message  Johnney Killian, RN, BSN, CCM Care Management Coordinator [facility] Phone: [phone]: [phone]

## 2022-10-02 ENCOUNTER — Telehealth: Payer: Self-pay

## 2022-10-02 NOTE — Patient Outreach (Signed)
  Care Coordination TOC Note Transition Care Management Follow-up Telephone Call Date of discharge and from where: -Kelly [facility] 09/30/22 How have you been since you were released from the hospital? "I am feeling pretty good, only a slight pain occasionally". Any questions or concerns? No  Items Reviewed: Did the pt receive and understand the discharge instructions provided? Yes  Medications obtained and verified? Yes  Other? No  Any new allergies since your discharge? No  Dietary orders reviewed? Yes Do you have support at home? Yes   Home Care and Equipment/Supplies: Were home health services ordered? no If so, what is the name of the agency? N/a  Has the agency set up a time to come to the patient's home? not applicable Were any new equipment or medical supplies ordered?  No What is the name of the medical supply agency? N/a Were you able to get the supplies/equipment? not applicable Do you have any questions related to the use of the equipment or supplies? No  Functional Questionnaire: (I = Independent and D = Dependent) ADLs: I  Bathing/Dressing- I  Meal Prep- I  Eating- I  Maintaining continence- I  Transferring/Ambulation- I  Managing Meds- I  Follow up appointments reviewed:  PCP Hospital f/u appt confirmed? Yes  Scheduled to see Dr. Jone Baseman on 10/21/22 @ 2:30. [facility] f/u appt confirmed? No   Are transportation arrangements needed? No  If their condition worsens, is the pt aware to call PCP or go to the Emergency Dept.? Yes Was the patient provided with contact information for the PCP's office or ED? Yes Was to pt encouraged to call back with questions or concerns? Yes  SDOH assessments and interventions completed:   Yes SDOH Interventions Today    Flowsheet Row Most Recent Value  SDOH Interventions   Food Insecurity Interventions Intervention Not Indicated  Housing Interventions Intervention Not Indicated  Transportation Interventions  Intervention Not Indicated       Care Coordination Interventions:  No Care Coordination interventions needed at this time.   Encounter Outcome:  Pt. Visit Completed

## 2022-10-08 ENCOUNTER — Other Ambulatory Visit: Payer: Medicare HMO

## 2022-10-21 ENCOUNTER — Ambulatory Visit
Admission: RE | Admit: 2022-10-21 | Discharge: 2022-10-21 | Disposition: A | Discharge status: Home / Self Care | Payer: Medicare HMO | Source: Ambulatory Visit | Attending: Adult Health | Admitting: Adult Health

## 2022-10-21 ENCOUNTER — Ambulatory Visit (INDEPENDENT_AMBULATORY_CARE_PROVIDER_SITE_OTHER): Payer: Medicare HMO | Admitting: Family Medicine

## 2022-10-21 ENCOUNTER — Encounter: Payer: Self-pay | Admitting: Family Medicine

## 2022-10-21 ENCOUNTER — Ambulatory Visit
Admission: RE | Admit: 2022-10-21 | Discharge: 2022-10-21 | Disposition: A | Discharge status: Home / Self Care | Payer: Self-pay | Source: Ambulatory Visit | Attending: Adult Health | Admitting: Adult Health

## 2022-10-21 VITALS — BP 122/82 | HR 66 | Temp 98.1°F | Ht 62.0 in | Wt 190.5 lb

## 2022-10-21 DIAGNOSIS — R928 Other abnormal and inconclusive findings on diagnostic imaging of breast: Secondary | ICD-10-CM

## 2022-10-21 DIAGNOSIS — K56609 Unspecified intestinal obstruction, unspecified as to partial versus complete obstruction: Secondary | ICD-10-CM

## 2022-10-21 DIAGNOSIS — Z122 Encounter for screening for malignant neoplasm of respiratory organs: Secondary | ICD-10-CM | POA: Diagnosis not present

## 2022-10-21 DIAGNOSIS — Z87891 Personal history of nicotine dependence: Secondary | ICD-10-CM

## 2022-10-21 DIAGNOSIS — N6311 Unspecified lump in the right breast, upper outer quadrant: Secondary | ICD-10-CM | POA: Diagnosis not present

## 2022-10-21 DIAGNOSIS — Z853 Personal history of malignant neoplasm of breast: Secondary | ICD-10-CM | POA: Diagnosis not present

## 2022-10-21 NOTE — Progress Notes (Signed)
Chief Complaint  Patient presents with   Hospitalization Follow-up    Subjective: Patient is a 77 y.o. female here for hosp f/u.  Patient was treated for small bowel obstruction at [facility] long hospital from 12/30 and discharged on 09/30/2022.  She reports significant improvement in her pain.  She is not having any nausea, vomiting, diarrhea, or bleeding.  She did not require surgery.  Past Medical History:  Diagnosis Date   Anemia    Arthritis    Breast cancer ([place]) 2020   right breast   Gallstones 05/31/2018   GERD (gastroesophageal reflux disease)    Hypertension    Personal history of radiation therapy    PONV (postoperative nausea and vomiting)    Syphilis     Objective: BP 122/82 (BP Location: Left Arm, Patient Position: Sitting, Cuff Size: Normal)   Pulse 66   Temp 98.1 F (36.7 C) (Oral)   Ht '5\' 2"'$  (1.575 m)   Wt 190 lb 8 oz (86.4 kg)   SpO2 96%   BMI 34.84 kg/m  General: Awake, appears stated age Heart: RRR, no LE edema Lungs: CTAB, no rales, wheezes or rhonchi. No accessory muscle use Abd: BS+, S, NT, ND Psych: Age appropriate judgment and insight, normal affect and mood  Assessment and Plan: SBO (small bowel obstruction) ([place]) - Plan: CBC, Comprehensive metabolic panel, Phosphorus  Seemingly resolved.  Follow-up on above labs.  Reck labs as above. Cont Tylenol as needed.  She never had surgery, her symptoms are improving, I am not sure she needs to see the general surgery team unless she has a strong desire to. The patient voiced understanding and agreement to the plan.  [place], DO 10/21/22  2:29 PM

## 2022-10-21 NOTE — Patient Instructions (Signed)
Give [place] 2-3 business days to get the results of your labs back.   OK to take Tylenol assuming your labs are normal.   Please send [place] a message with the dates you had your Shingrix and most recent covid vaccine.   Let us know if you need anything.

## 2022-10-22 ENCOUNTER — Other Ambulatory Visit: Payer: Self-pay | Admitting: Adult Health

## 2022-10-22 DIAGNOSIS — N6310 Unspecified lump in the right breast, unspecified quadrant: Secondary | ICD-10-CM

## 2022-10-22 LAB — COMPREHENSIVE METABOLIC PANEL
ALT: 6 U/L (ref 0–35)
AST: 11 U/L (ref 0–37)
Albumin: 4.3 g/dL (ref 3.5–5.2)
Alkaline Phosphatase: 60 U/L (ref 39–117)
BUN: 16 mg/dL (ref 6–23)
CO2: 29 mEq/L (ref 19–32)
Calcium: 9.2 mg/dL (ref 8.4–10.5)
Chloride: 105 mEq/L (ref 96–112)
Creatinine, Ser: 0.69 mg/dL (ref 0.40–1.20)
GFR: 85.6 mL/min (ref >60.00)
Glucose, Bld: 123 mg/dL — ABNORMAL HIGH (ref 70–99)
Potassium: 4.3 mEq/L (ref 3.5–5.1)
Sodium: 142 mEq/L (ref 135–145)
Total Bilirubin: 0.4 mg/dL (ref 0.2–1.2)
Total Protein: 6.9 g/dL (ref 6.0–8.3)

## 2022-10-22 LAB — CBC
HCT: 38.1 % (ref 36.0–46.0)
Hemoglobin: 12.6 g/dL (ref 12.0–15.0)
MCHC: 33.1 g/dL (ref 30.0–36.0)
MCV: 93 fl (ref 78.0–100.0)
Platelets: 245 10*3/uL (ref 150.0–400.0)
RBC: 4.09 Mil/uL (ref 3.87–5.11)
RDW: 13.3 % (ref 11.5–15.5)
WBC: 4.9 10*3/uL (ref 4.0–10.5)

## 2022-10-22 LAB — PHOSPHORUS: Phosphorus: 4.2 mg/dL (ref 2.3–4.6)

## 2022-11-16 ENCOUNTER — Emergency Department (HOSPITAL_COMMUNITY): Payer: Medicare HMO

## 2022-11-16 ENCOUNTER — Encounter (HOSPITAL_COMMUNITY): Payer: Self-pay

## 2022-11-16 ENCOUNTER — Emergency Department (HOSPITAL_COMMUNITY)
Admission: EM | Admit: 2022-11-16 | Discharge: 2022-11-16 | Disposition: A | Discharge status: Home / Self Care | Payer: Medicare HMO | Attending: Emergency Medicine | Admitting: Emergency Medicine

## 2022-11-16 ENCOUNTER — Other Ambulatory Visit: Payer: Self-pay

## 2022-11-16 DIAGNOSIS — Y92481 Parking lot as the place of occurrence of the external cause: Secondary | ICD-10-CM | POA: Insufficient documentation

## 2022-11-16 DIAGNOSIS — S1093XA Contusion of unspecified part of neck, initial encounter: Secondary | ICD-10-CM | POA: Diagnosis not present

## 2022-11-16 DIAGNOSIS — S199XXA Unspecified injury of neck, initial encounter: Secondary | ICD-10-CM | POA: Diagnosis not present

## 2022-11-16 DIAGNOSIS — Z041 Encounter for examination and observation following transport accident: Secondary | ICD-10-CM | POA: Diagnosis not present

## 2022-11-16 DIAGNOSIS — S0003XA Contusion of scalp, initial encounter: Secondary | ICD-10-CM | POA: Insufficient documentation

## 2022-11-16 DIAGNOSIS — M542 Cervicalgia: Secondary | ICD-10-CM | POA: Diagnosis not present

## 2022-11-16 DIAGNOSIS — S0990XA Unspecified injury of head, initial encounter: Secondary | ICD-10-CM | POA: Diagnosis not present
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
## 2022-11-16 NOTE — ED Notes (Signed)
Patient transported to CT 

## 2022-11-20 ENCOUNTER — Telehealth: Payer: Self-pay

## 2022-11-20 NOTE — Telephone Encounter (Signed)
        Patient  visited [facility] on 11/16/2022  for neck injury.   Telephone encounter attempt :  1st  A HIPAA compliant voice message was left requesting a return call.  Instructed patient to call back at [phone].   [place] Resource Care Guide   [email or web address]  ?? [id number]   Website: [email or web address]  [email or web address]

## 2022-11-26 ENCOUNTER — Telehealth: Payer: Self-pay

## 2022-11-26 NOTE — Telephone Encounter (Signed)
        Patient  visited [facility] on 11/16/2022  for neck injury.   Telephone encounter attempt :  2nd  A HIPAA compliant voice message was left requesting a return call.  Instructed patient to call back at [phone].   [place] Resource Care Guide   [email or web address]  ?? [id number]   Website: [email or web address]  [email or web address]

## 2022-11-27 ENCOUNTER — Telehealth: Payer: Self-pay

## 2022-11-27 NOTE — Telephone Encounter (Signed)
     Patient  visit on 11/16/2022  at [facility] was for neck injury.  Have you been able to follow up with your primary care physician? Patient stated she is feeling better.  The patient was or was not able to obtain any needed medicine or equipment. No medication prescribed.  Are there diet recommendations that you are having difficulty following? No  Patient expresses understanding of discharge instructions and education provided has no other needs at this time. Yes   [place] Resource Care Guide   [email or web address]  ?? [id number]   Website: [email or web address]  [email or web address]

## 2022-11-28 ENCOUNTER — Other Ambulatory Visit: Payer: Self-pay | Admitting: Family Medicine

## 2022-11-28 DIAGNOSIS — I10 Essential (primary) hypertension: Secondary | ICD-10-CM

## 2022-12-05 ENCOUNTER — Encounter: Payer: Self-pay | Admitting: Radiology

## 2022-12-23 ENCOUNTER — Ambulatory Visit
Admission: RE | Admit: 2022-12-23 | Discharge: 2022-12-23 | Disposition: A | Discharge status: Home / Self Care | Payer: Medicare HMO | Source: Ambulatory Visit | Attending: Adult Health | Admitting: Adult Health

## 2022-12-23 ENCOUNTER — Other Ambulatory Visit: Payer: Self-pay | Admitting: Adult Health

## 2022-12-23 DIAGNOSIS — N6310 Unspecified lump in the right breast, unspecified quadrant: Secondary | ICD-10-CM

## 2022-12-30 ENCOUNTER — Encounter: Payer: Self-pay | Admitting: Orthopaedic Surgery

## 2022-12-30 ENCOUNTER — Ambulatory Visit: Payer: Medicare HMO | Admitting: Orthopaedic Surgery

## 2022-12-30 DIAGNOSIS — M1711 Unilateral primary osteoarthritis, right knee: Secondary | ICD-10-CM | POA: Diagnosis not present

## 2022-12-30 NOTE — Progress Notes (Signed)
The patient is well-known to me.  She has well-documented severe end-stage arthritis involving her right knee and we have seen her for over a year for the right knee.  We actually replaced her left knee successfully in June 2023.  Both knees had severe varus malalignment and we have straightened her left knee.  At this point her right knee pain is daily and it is 10 out of 10.  She is in need of a knee replacement surgery on the right side.  We reviewed all of her notes within epic as well as her past medical history and medical history and medicines.  She is very pleased with her left total knee arthroplasty.  Her left knee is nice and straight.  She has full flexion and extension of the left knee and is ligamentously stable.  Her right knee has significant varus malalignment.  There is severe medial joint line tenderness and lateral tenderness as well as patellofemoral crepitation.  Her varus malalignment is not correctable.  Her range of motion is also limited and we could barely get her past 90 degrees of flexion.  Previous x-rays of the right knee showed severe end-stage tricompartment arthritis with complete loss of the medial joint line and varus malalignment.  There is osteophytes in all 3 compartments and severe patellofemoral arthritis.  At this point we will work on getting her scheduled in early June for a right total knee arthroplasty.  The risks and benefits of this have been explained in detail and are well understood by her having had this done in June of last year for her left knee.

## 2023-01-29 ENCOUNTER — Telehealth: Payer: Self-pay | Admitting: Family Medicine

## 2023-01-29 NOTE — Telephone Encounter (Signed)
Contacted Kelly Dixon to schedule their annual wellness visit. Appointment made for 02/13/2023.  Verlee Rossetti; Care Guide Ambulatory Clinical Support [place] [facility] Direct Dial: [phone]

## 2023-02-03 ENCOUNTER — Other Ambulatory Visit: Payer: Self-pay

## 2023-02-05 ENCOUNTER — Other Ambulatory Visit: Payer: Self-pay

## 2023-02-05 DIAGNOSIS — Z122 Encounter for screening for malignant neoplasm of respiratory organs: Secondary | ICD-10-CM

## 2023-02-05 DIAGNOSIS — Z87891 Personal history of nicotine dependence: Secondary | ICD-10-CM

## 2023-02-13 ENCOUNTER — Ambulatory Visit (INDEPENDENT_AMBULATORY_CARE_PROVIDER_SITE_OTHER): Payer: Medicare HMO | Admitting: *Deleted

## 2023-02-13 DIAGNOSIS — Z0000 Encounter for general adult medical examination without abnormal findings: Secondary | ICD-10-CM | POA: Diagnosis not present

## 2023-02-13 NOTE — Progress Notes (Signed)
Subjective:  Pt completed ADLs, Fall risk, and SDOH during e-check in on 02/10/23.  Answers verified with pt.    Kelly Dixon is a 77 y.o. female who presents for Medicare Annual (Subsequent) preventive examination.  I connected with  Kelly Dixon on 02/13/23 by a audio enabled telemedicine application and verified that I am speaking with the correct person using two identifiers.  Patient Location: Home  Provider Location: Office/Clinic  I discussed the limitations of evaluation and management by telemedicine. The patient expressed understanding and agreed to proceed.   Review of Systems     Cardiac Risk Factors include: advanced age (>79men, >42 women);hypertension     Objective:    There were no vitals filed for this visit. There is no height or weight on file to calculate BMI.     02/13/2023    3:00 PM 11/16/2022    4:20 PM 09/29/2022    1:04 AM 09/29/2022   12:00 AM 09/28/2022    7:22 PM 04/09/2022   10:19 AM 03/27/2022    4:55 PM  Advanced Directives  Does Patient Have a Medical Advance Directive? Yes Yes  Yes No No No  Type of Advance Directive Living will Living will;Healthcare Power of [employer] Power of Attorney      Does patient want to make changes to medical advance directive?   No - Patient declined      Copy of Healthcare Power of Attorney in Chart?  No - copy requested, Physician notified       Would patient like information on creating a medical advance directive?      No - Patient declined No - Patient declined    Current Medications (verified) Outpatient Encounter Medications as of 02/13/2023  Medication Sig   amLODipine (NORVASC) 5 MG tablet TAKE 1 TABLET(5 MG) BY MOUTH DAILY   cyanocobalamin 1000 MCG tablet Take 1 tablet (1,000 mcg total) by mouth daily.   ferrous sulfate 325 (65 FE) MG EC tablet Take 1 tablet (325 mg total) by mouth daily with breakfast.   fexofenadine (ALLEGRA) 180 MG tablet Take 180 mg by mouth daily.   ondansetron  (ZOFRAN) 4 MG tablet Take 1 tablet (4 mg total) by mouth every 6 (six) hours as needed for nausea.   No facility-administered encounter medications on file as of 02/13/2023.    Allergies (verified) Patient has no known allergies.   History: Past Medical History:  Diagnosis Date   Allergy 1996   Anemia    Arthritis    Breast cancer ([facility]) 2020   right breast   Cataract May 2018   Gallstones 05/31/2018   GERD (gastroesophageal reflux disease)    Hypertension    Personal history of radiation therapy    PONV (postoperative nausea and vomiting)    Syphilis    Past Surgical History:  Procedure Laterality Date   BREAST BIOPSY Right 08/2019   BREAST LUMPECTOMY Right 08/2019   BREAST LUMPECTOMY WITH RADIOACTIVE SEED AND SENTINEL LYMPH NODE BIOPSY Right 09/15/2019   Procedure: RIGHT BREAST LUMPECTOMY WITH RADIOACTIVE SEED AND SENTINEL LYMPH NODE BIOPSY;  Surgeon: [name], MD;  Location: [facility];  Service: General;  Laterality: Right;   CHOLECYSTECTOMY N/A 04/13/2019   Procedure: LAPAROSCOPIC CHOLECYSTECTOMY WITH INTRAOPERATIVE CHOLANGIOGRAM;  Surgeon: [name], MD;  Location: [facility] OR;  Service: General;  Laterality: N/A;   DILATION AND CURETTAGE OF UTERUS     JOINT REPLACEMENT  June 2023   TOTAL KNEE ARTHROPLASTY Left 03/26/2022  Procedure: LEFT TOTAL KNEE ARTHROPLASTY;  Surgeon: Kathryne Hitch, MD;  Location: [facility] OR;  Service: Orthopedics;  Laterality: Left;   TUBAL LIGATION  1974   Family History  Problem Relation Age of Onset   Arthritis Mother    Hearing loss Mother    Heart disease Father    Arthritis Sister    Colon cancer Neg Hx    Colon polyps Neg Hx    Esophageal cancer Neg Hx    Rectal cancer Neg Hx    Stomach cancer Neg Hx    Social History   Socioeconomic History   Marital status: Divorced    Spouse name: Not on file   Number of children: Not on file   Years of education: Not on file   Highest education level: Not on file   Occupational History   Not on file  Tobacco Use   Smoking status: Former    Packs/day: 0.50    Years: 36.00    Additional pack years: 0.00    Total pack years: 18.00    Types: Cigarettes    Start date: 10/04/1965    Quit date: 11/29/2019    Years since quitting: 3.2   Smokeless tobacco: Never  Vaping Use   Vaping Use: Never used  Substance and Sexual Activity   Alcohol use: Yes    Comment: rarely   Drug use: Never   Sexual activity: Not on file  Other Topics Concern   Not on file  Social History Narrative   Not on file   Social Determinants of Health   Financial Resource Strain: Medium Risk (02/10/2023)   Overall Financial Resource Strain (CARDIA)    Difficulty of Paying Living Expenses: Somewhat hard  Food Insecurity: No Food Insecurity (02/10/2023)   Hunger Vital Sign    Worried About Running Out of Food in the Last Year: Never true    Ran Out of Food in the Last Year: Never true  Transportation Needs: No Transportation Needs (02/10/2023)   PRAPARE - Administrator, Civil Service (Medical): No    Lack of Transportation (Non-Medical): No  Physical Activity: Insufficiently Active (02/10/2023)   Exercise Vital Sign    Days of Exercise per Week: 3 days    Minutes of Exercise per Session: 30 min  Stress: No Stress Concern Present (02/10/2023)   [employer] of Occupational Health - Occupational Stress Questionnaire    Feeling of Stress : Not at all  Social Connections: Unknown (02/10/2023)   Social Connection and Isolation Panel [NHANES]    Frequency of Communication with Friends and Family: More than three times a week    Frequency of Social Gatherings with Friends and Family: More than three times a week    Attends Religious Services: Not on Marketing executive or Organizations: Yes    Attends Engineer, structural: More than 4 times per year    Marital Status: Divorced    Tobacco Counseling Counseling given: Not Answered   Clinical  Intake:  Pre-visit preparation completed: Yes  Pain : No/denies pain  Nutritional Risks: None Diabetes: No  How often do you need to have someone help you when you read instructions, pamphlets, or other written materials from your doctor or pharmacy?: 1 - Never   Activities of Daily Living    02/10/2023   11:13 AM 09/29/2022   12:00 AM  In your present state of health, do you have any difficulty performing the following activities:  Hearing? 1 1  Vision? 0 1  Difficulty concentrating or making decisions? 0 0  Walking or climbing stairs? 0 0  Dressing or bathing? 0 0  Doing errands, shopping? 0 0  Preparing Food and eating ? N   Using the Toilet? N   In the past six months, have you accidently leaked urine? Y   Do you have problems with loss of bowel control? N   Managing your Medications? N   Managing your Finances? N   Housekeeping or managing your Housekeeping? N     Patient Care Team: Sharlene Dory, DO as PCP - General (Family Medicine) [name], MD as Consulting Physician (General Surgery) Serena Croissant, MD as Consulting Physician (Hematology and Oncology) Lonie Peak, MD as Attending Physician (Radiation Oncology)  Indicate any recent Medical Services you may have received from other than [facility] providers in the past year (date may be approximate).     Assessment:   This is a routine wellness examination for [place].  Hearing/Vision screen No results found.  Dietary issues and exercise activities discussed: Current Exercise Habits: Home exercise routine, Type of exercise: strength training/weights, Time (Minutes): 30, Frequency (Times/Week): 3, Weekly Exercise (Minutes/Week): 90, Intensity: Mild, Exercise limited by: orthopedic condition(s)   Goals Addressed   None    Depression Screen    02/13/2023    3:02 PM 05/10/2022    8:57 AM 02/11/2022    9:03 AM 01/18/2021   12:30 PM 12/30/2019    9:16 AM 12/28/2018   11:23 AM  PHQ 2/9 Scores  PHQ -  2 Score 0 0 0 1 0 0  PHQ- 9 Score  0        Fall Risk    02/10/2023   11:13 AM 10/21/2022    2:41 PM 05/10/2022    8:57 AM 02/11/2022    9:03 AM 01/18/2021   12:30 PM  Fall Risk   Falls in the past year? 0 0 0 0 0  Number falls in past yr: 0 0 0 0 0  Injury with Fall? 0 0 0 0 0  Risk for fall due to : No Fall Risks Orthopedic patient No Fall Risks No Fall Risks   Follow up Falls evaluation completed  Falls evaluation completed Falls evaluation completed Falls prevention discussed    FALL RISK PREVENTION PERTAINING TO THE HOME:  Any stairs in or around the home? Yes  If so, are there any without handrails? No  Home free of loose throw rugs in walkways, pet beds, electrical cords, etc? Yes  Adequate lighting in your home to reduce risk of falls? Yes   ASSISTIVE DEVICES UTILIZED TO PREVENT FALLS:  Life alert? No  Use of a cane, walker or w/c? No  Grab bars in the bathroom? No  Shower chair or bench in shower? Yes  Elevated toilet seat or a handicapped toilet? No   TIMED UP AND GO:  Was the test performed?  No, audio visit .    Cognitive Function:        02/13/2023    3:07 PM 02/11/2022    9:08 AM 01/18/2021   12:41 PM  6CIT Screen  What Year? 0 points 0 points 0 points  What month? 0 points 0 points 0 points  What time? 0 points 0 points 0 points  Count back from 20 0 points 0 points 0 points  Months in reverse 0 points 0 points 0 points  Repeat phrase 0 points 0 points 0 points  Total Score 0 points 0 points 0 points    Immunizations Immunization History  Administered Date(s) Administered   COVID-19, mRNA, vaccine(Comirnaty)12 years and older 07/22/2022   Influenza-Unspecified 07/22/2022   Moderna Covid-19 Vaccine Bivalent Booster 9yrs & up 08/04/2021   PFIZER(Purple Top)SARS-COV-2 Vaccination 11/25/2019, 12/15/2019, 07/24/2020, 01/18/2021   PNEUMOCOCCAL CONJUGATE-20 05/10/2022   PPD Test 06/24/2018   Pneumococcal Conjugate-13 05/27/2018   Tdap 05/27/2018    Zoster Recombinat (Shingrix) 08/20/2022    TDAP status: Up to date  Flu Vaccine status: Up to date  Pneumococcal vaccine status: Up to date  Covid-19 vaccine status: Information provided on how to obtain vaccines.   Qualifies for Shingles Vaccine? Yes   Zostavax completed No   Shingrix Completed?: No.    Education has been provided regarding the importance of this vaccine. Patient has been advised to call insurance company to determine out of pocket expense if they have not yet received this vaccine. Advised may also receive vaccine at local pharmacy or Health Dept. Verbalized acceptance and understanding.  Screening Tests Health Maintenance  Topic Date Due   COVID-19 Vaccine (7 - 2023-24 season) 09/16/2022   Zoster Vaccines- Shingrix (2 of 2) 10/15/2022   Medicare Annual Wellness (AWV)  02/12/2023   INFLUENZA VACCINE  05/01/2023   MAMMOGRAM  09/25/2024   Fecal DNA (Cologuard)  12/08/2024   DTaP/Tdap/Td (2 - Td or Tdap) 05/27/2028   Pneumonia Vaccine 62+ Years old  Completed   DEXA SCAN  Completed   Hepatitis C Screening  Completed   HPV VACCINES  Aged Out   COLONOSCOPY (Pts 45-92yrs Insurance coverage will need to be confirmed)  Discontinued    Health Maintenance  Health Maintenance Due  Topic Date Due   COVID-19 Vaccine (7 - 2023-24 season) 09/16/2022   Zoster Vaccines- Shingrix (2 of 2) 10/15/2022   Medicare Annual Wellness (AWV)  02/12/2023    Colorectal cancer screening: Type of screening: Cologuard. Completed 12/08/21. Repeat every 3 years  Mammogram status: Completed 12/27/. Repeat every year  Bone Density status: Completed 08/01/20. Results reflect: Bone density results: OSTEOPENIA. Repeat every 2 years.  Lung Cancer Screening: (Low Dose CT Chest recommended if Age 61-80 years, 30 pack-year currently smoking OR have quit w/in 15years.) does qualify.   Lung Cancer Screening Referral: ordered on 10/21/22 Additional Screening:  Hepatitis C Screening: does  qualify; Completed 05/27/18  Vision Screening: Recommended annual ophthalmology exams for early detection of glaucoma and other disorders of the eye. Is the patient up to date with their annual eye exam?  No  Who is the provider or what is the name of the office in which the patient attends annual eye exams? [facility] If pt is not established with a provider, would they like to be referred to a provider to establish care? No .   Dental Screening: Recommended annual dental exams for proper oral hygiene  Community Resource Referral / Chronic Care Management: CRR required this visit?  No   CCM required this visit?  No      Plan:     I have personally reviewed and noted the following in the patient's chart:   Medical and social history Use of alcohol, tobacco or illicit drugs  Current medications and supplements including opioid prescriptions. Patient is not currently taking opioid prescriptions. Functional ability and status Nutritional status Physical activity Advanced directives List of other physicians Hospitalizations, surgeries, and ER visits in previous 12 months Vitals Screenings to include cognitive, depression, and falls Referrals and appointments  In  addition, I have reviewed and discussed with patient certain preventive protocols, quality metrics, and best practice recommendations. A written personalized care plan for preventive services as well as general preventive health recommendations were provided to patient.   Due to this being a telephonic visit, the after visit summary with patients personalized plan was offered to patient via mail or my-chart. Patient would like to access on my-chart.  Donne Anon, [place]   02/13/2023   Nurse Notes: None

## 2023-02-13 NOTE — Patient Instructions (Signed)
Kelly Dixon , Thank you for taking time to come for your Medicare Wellness Visit. I appreciate your ongoing commitment to your health goals. Please review the following plan we discussed and let me know if I can assist you in the future.     This is a list of the screening recommended for you and due dates:  Health Maintenance  Topic Date Due   COVID-19 Vaccine (7 - 2023-24 season) 09/16/2022   Zoster (Shingles) Vaccine (2 of 2) 10/15/2022   Flu Shot  05/01/2023   Medicare Annual Wellness Visit  02/13/2024   Mammogram  09/25/2024   Cologuard (Stool DNA test)  12/08/2024   DTaP/Tdap/Td vaccine (2 - Td or Tdap) 05/27/2028   Pneumonia Vaccine  Completed   DEXA scan (bone density measurement)  Completed   Hepatitis C Screening: USPSTF Recommendation to screen - Ages 63-79 yo.  Completed   HPV Vaccine  Aged Out   Colon Cancer Screening  Discontinued    Next appointment: Follow up in one year for your annual wellness visit.   Preventive Care 75 Years and Older, Female Preventive care refers to lifestyle choices and visits with your health care provider that can promote health and wellness. What does preventive care include? A yearly physical exam. This is also called an annual well check. Dental exams once or twice a year. Routine eye exams. Ask your health care provider how often you should have your eyes checked. Personal lifestyle choices, including: Daily care of your teeth and gums. Regular physical activity. Eating a healthy diet. Avoiding tobacco and drug use. Limiting alcohol use. Practicing safe sex. Taking low-dose aspirin every day. Taking vitamin and mineral supplements as recommended by your health care provider. What happens during an annual well check? The services and screenings done by your health care provider during your annual well check will depend on your age, overall health, lifestyle risk factors, and family history of disease. Counseling  Your health care  provider may ask you questions about your: Alcohol use. Tobacco use. Drug use. Emotional well-being. Home and relationship well-being. Sexual activity. Eating habits. History of falls. Memory and ability to understand (cognition). Work and work Astronomer. Reproductive health. Screening  You may have the following tests or measurements: Height, weight, and BMI. Blood pressure. Lipid and cholesterol levels. These may be checked every 5 years, or more frequently if you are over 56 years old. Skin check. Lung cancer screening. You may have this screening every year starting at age 27 if you have a 30-pack-year history of smoking and currently smoke or have quit within the past 15 years. Fecal occult blood test (FOBT) of the stool. You may have this test every year starting at age 13. Flexible sigmoidoscopy or colonoscopy. You may have a sigmoidoscopy every 5 years or a colonoscopy every 10 years starting at age 77. Hepatitis C blood test. Hepatitis B blood test. Sexually transmitted disease (STD) testing. Diabetes screening. This is done by checking your blood sugar (glucose) after you have not eaten for a while (fasting). You may have this done every 1-3 years. Bone density scan. This is done to screen for osteoporosis. You may have this done starting at age 43. Mammogram. This may be done every 1-2 years. Talk to your health care provider about how often you should have regular mammograms. Talk with your health care provider about your test results, treatment options, and if necessary, the need for more tests. Vaccines  Your health care provider may recommend certain  vaccines, such as: Influenza vaccine. This is recommended every year. Tetanus, diphtheria, and acellular pertussis (Tdap, Td) vaccine. You may need a Td booster every 10 years. Zoster vaccine. You may need this after age 55. Pneumococcal 13-valent conjugate (PCV13) vaccine. One dose is recommended after age  75. Pneumococcal polysaccharide (PPSV23) vaccine. One dose is recommended after age 75. Talk to your health care provider about which screenings and vaccines you need and how often you need them. This information is not intended to replace advice given to you by your health care provider. Make sure you discuss any questions you have with your health care provider. Document Released: 10/13/2015 Document Revised: 06/05/2016 Document Reviewed: 07/18/2015 Elsevier Interactive Patient Education  2017 [employer] Prevention in the Home Falls can cause injuries. They can happen to people of all ages. There are many things you can do to make your home safe and to help prevent falls. What can I do on the outside of my home? Regularly fix the edges of walkways and driveways and fix any cracks. Remove anything that might make you trip as you walk through a door, such as a raised step or threshold. Trim any bushes or trees on the path to your home. Use bright outdoor lighting. Clear any walking paths of anything that might make someone trip, such as rocks or tools. Regularly check to see if handrails are loose or broken. Make sure that both sides of any steps have handrails. Any raised decks and porches should have guardrails on the edges. Have any leaves, snow, or ice cleared regularly. Use sand or salt on walking paths during winter. Clean up any spills in your garage right away. This includes oil or grease spills. What can I do in the bathroom? Use night lights. Install grab bars by the toilet and in the tub and shower. Do not use towel bars as grab bars. Use non-skid mats or decals in the tub or shower. If you need to sit down in the shower, use a plastic, non-slip stool. Keep the floor dry. Clean up any water that spills on the floor as soon as it happens. Remove soap buildup in the tub or shower regularly. Attach bath mats securely with double-sided non-slip rug tape. Do not have throw  rugs and other things on the floor that can make you trip. What can I do in the bedroom? Use night lights. Make sure that you have a light by your bed that is easy to reach. Do not use any sheets or blankets that are too big for your bed. They should not hang down onto the floor. Have a firm chair that has side arms. You can use this for support while you get dressed. Do not have throw rugs and other things on the floor that can make you trip. What can I do in the kitchen? Clean up any spills right away. Avoid walking on wet floors. Keep items that you use a lot in easy-to-reach places. If you need to reach something above you, use a strong step stool that has a grab bar. Keep electrical cords out of the way. Do not use floor polish or wax that makes floors slippery. If you must use wax, use non-skid floor wax. Do not have throw rugs and other things on the floor that can make you trip. What can I do with my stairs? Do not leave any items on the stairs. Make sure that there are handrails on both sides of the stairs  and use them. Fix handrails that are broken or loose. Make sure that handrails are as long as the stairways. Check any carpeting to make sure that it is firmly attached to the stairs. Fix any carpet that is loose or worn. Avoid having throw rugs at the top or bottom of the stairs. If you do have throw rugs, attach them to the floor with carpet tape. Make sure that you have a light switch at the top of the stairs and the bottom of the stairs. If you do not have them, ask someone to add them for you. What else can I do to help prevent falls? Wear shoes that: Do not have high heels. Have rubber bottoms. Are comfortable and fit you well. Are closed at the toe. Do not wear sandals. If you use a stepladder: Make sure that it is fully opened. Do not climb a closed stepladder. Make sure that both sides of the stepladder are locked into place. Ask someone to hold it for you, if  possible. Clearly mark and make sure that you can see: Any grab bars or handrails. First and last steps. Where the edge of each step is. Use tools that help you move around (mobility aids) if they are needed. These include: Canes. Walkers. Scooters. Crutches. Turn on the lights when you go into a dark area. Replace any light bulbs as soon as they burn out. Set up your furniture so you have a clear path. Avoid moving your furniture around. If any of your floors are uneven, fix them. If there are any pets around you, be aware of where they are. Review your medicines with your doctor. Some medicines can make you feel dizzy. This can increase your chance of falling. Ask your doctor what other things that you can do to help prevent falls. This information is not intended to replace advice given to you by your health care provider. Make sure you discuss any questions you have with your health care provider. Document Released: 07/13/2009 Document Revised: 02/22/2016 Document Reviewed: 10/21/2014 Elsevier Interactive Patient Education  2017 [employer].

## 2023-02-28 ENCOUNTER — Encounter: Payer: Self-pay | Admitting: Physician Assistant

## 2023-02-28 ENCOUNTER — Ambulatory Visit (INDEPENDENT_AMBULATORY_CARE_PROVIDER_SITE_OTHER): Payer: Medicare HMO | Admitting: Physician Assistant

## 2023-02-28 DIAGNOSIS — Z87891 Personal history of nicotine dependence: Secondary | ICD-10-CM | POA: Diagnosis not present

## 2023-02-28 DIAGNOSIS — F17211 Nicotine dependence, cigarettes, in remission: Secondary | ICD-10-CM

## 2023-02-28 NOTE — Patient Instructions (Signed)
Thank you for participating in the [facility]. It was our pleasure to meet you today. We will call you with the results of your scan within the next few days. Your scan will be assigned a Lung RADS category score by the physicians reading the scans.  This Lung RADS score determines follow up scanning.  See below for description of categories, and follow up screening recommendations. We will be in touch to schedule your follow up screening annually or based on recommendations of our providers. We will fax a copy of your scan results to your Primary Care Physician, or the physician who referred you to the program, to ensure they have the results. Please call the office if you have any questions or concerns regarding your scanning experience or results.  Our office number is [phone]. Please speak with Denise Phelps, RN. , or  Denise Buckner RN, They are  our Lung Cancer Screening RN.'s If They are unavailable when you call, Please leave a message on the voice mail. We will return your call at our earliest convenience.This voice mail is monitored several times a day.  Remember, if your scan is normal, we will scan you annually as long as you continue to meet the criteria for the program. (Age 50-80, Current smoker or smoker who has quit within the last 15 years). If you are a smoker, remember, quitting is the single most powerful action that you can take to decrease your risk of lung cancer and other pulmonary, breathing related problems. We know quitting is hard, and we are here to help.  Please let us know if there is anything we can do to help you meet your goal of quitting. If you are a former smoker, congratulations. We are proud of you! Remain smoke free! Remember you can refer friends or family members through the number above.  We will screen them to make sure they meet criteria for the program. Thank you for helping us take better care of you by  participating in Lung Screening.  You can receive free nicotine replacement therapy ( patches, gum or mints) by calling 1-800-QUIT NOW. Please call so we can get you on the path to becoming  a non-smoker. I know it is hard, but you can do this!  Lung RADS Categories:  Lung RADS 1: no nodules or definitely non-concerning nodules.  Recommendation is for a repeat annual scan in 12 months.  Lung RADS 2:  nodules that are non-concerning in appearance and behavior with a very low likelihood of becoming an active cancer. Recommendation is for a repeat annual scan in 12 months.  Lung RADS 3: nodules that are probably non-concerning , includes nodules with a low likelihood of becoming an active cancer.  Recommendation is for a 6-month repeat screening scan. Often noted after an upper respiratory illness. We will be in touch to make sure you have no questions, and to schedule your 6-month scan.  Lung RADS 4 A: nodules with concerning findings, recommendation is most often for a follow up scan in 3 months or additional testing based on our provider's assessment of the scan. We will be in touch to make sure you have no questions and to schedule the recommended 3 month follow up scan.  Lung RADS 4 B:  indicates findings that are concerning. We will be in touch with you to schedule additional diagnostic testing based on our provider's  assessment of the scan.  Other options for assistance in smoking cessation (   As covered by your insurance benefits)  Hypnosis for smoking cessation  [employer] [phone]  Acupuncture for smoking cessation  [facility] [phone]   

## 2023-02-28 NOTE — Progress Notes (Signed)
Virtual Visit via Telephone Note  I connected with Kelly Dixon on 02/28/23 at 11:24 AM by telephone and verified that I am speaking with the correct person using two identifiers.  Location: Patient: home Provider: working virtually from home   I discussed the limitations, risks, security and privacy concerns of performing an evaluation and management service by telephone and the availability of in person appointments. I also discussed with the patient that there may be a patient responsible charge related to this service. The patient expressed understanding and agreed to proceed.       Shared Decision Making Visit Lung Cancer Screening Program [phone])   Eligibility: Age 75 Pack Years Smoking History Calculation 56 (# packs/per year x # years smoked) Recent History of coughing up blood  no Unexplained weight loss? no ( >Than 15 pounds within the last 6 months ) Prior History Lung / other cancer no (Diagnosis within the last 5 years already requiring surveillance chest CT Scans). Smoking Status Former Smoker Former Smokers: Years since quit: 3 years  Quit Date: 2021  Visit Components: Discussion included one or more decision making aids. yes Discussion included risk/benefits of screening. yes Discussion included potential follow up diagnostic testing for abnormal scans. yes Discussion included meaning and risk of over diagnosis. yes Discussion included meaning and risk of False Positives. yes Discussion included meaning of total radiation exposure. yes  Counseling Included: Importance of adherence to annual lung cancer LDCT screening. yes Impact of comorbidities on ability to participate in the program. yes Ability and willingness to under diagnostic treatment. yes  Smoking Cessation Counseling: Former Smokers:  Discussed the importance of maintaining cigarette abstinence. yes Diagnosis Code: Personal History of Nicotine Dependence. U04.540 Information about tobacco  cessation classes and interventions provided to patient. Yes Written Order for Lung Cancer Screening with LDCT placed in Epic. Yes (CT Chest Lung Cancer Screening Low Dose W/O CM) [id number] Z12.2-Screening of respiratory organs Z87.891-Personal history of nicotine dependence    I spent 25 minutes of face to face time/virtual visit time  with the patient discussing the risks and benefits of lung cancer screening. We took the time to pause at intervals to allow for questions to be asked and answered to ensure understanding. We discussed that they had taken the single most powerful action possible to decrease their risk of developing lung cancer when they quit smoking. I counseled them to remain smoke free, and to contact the office if they ever had the desire to smoke again so that we can provide resources and tools to help support the effort to remain smoke free. We discussed the time and location of the scan, and they  will receive a call or letter with the results within  24-72 hours of receiving them. They have the office contact information in the event they have questions.   They verbalized understanding of all of the above and had no further questions.    I explained to the patient that there has been a high incidence of coronary artery disease noted on these exams. I explained that this is a non-gated exam therefore degree or severity cannot be determined. This patient is not on statin therapy. I have asked the patient to follow-up with their PCP regarding any incidental finding of coronary artery disease and management with diet or medication as they feel is clinically indicated. The patient verbalized understanding of the above and had no further questions.      Darcella Gasman Merl Guardino, PA-C

## 2023-03-04 NOTE — Progress Notes (Signed)
     Your surgery and Pre-Admission testing visit will be at [facility] located at [address], [place], [place] [address].  Please let all your doctors (i.e., Primary Care Physician, Cardiologist, Endocrinologist, Pulmonologist) know you are having surgery. You may need clearance for surgery. If you are on blood thinners, notify your surgeon and ask the doctor who prescribed them how long to hold them before surgery.  If you have had a heart test, such as an EKG, stress test, heart ultrasound, etc., or lab work performed outside of [place], please bring copies of these tests to your Pre-Admission testing, if possible.  These departments may contact you before the day of surgery:  Pre-Service Center - insurance/ billing: [phone] Pharmacy- to review your medications: [phone] Pre-Admission Testing- to set an appointment for your visit: [phone]  (Often, these numbers show up as "SPAM" on your phone)  The Pre-Admission Testing (PAT) visit focuses on Anesthesia for your upcoming surgery.  You do NOT need to fast; take your medications as usual. Please arrive 30 minutes early to allow for parking and admitting.  The visit may last up to an hour. Bring a photo ID and medical insurance card. Reschedule if you are sick. ([phone]) and please, NO children under age 16 at the visit.  During the PAT visit:  We will review your medical and surgical history.   You will receive pre-operative instructions, including the time of arrival at the hospital and surgical start time.  We will review what medication(s) you can take on the day of surgery.  After speaking with the nurse, you will have blood drawn and, if needed, a chest x-ray and EKG.  Most lab results from your doctor are good for 30 days, Hemoglobin A1C is good for 60 days. If you cannot talk to the Pharmacy, bring your medications or a list of them to the PST visit.   Infection control for the [facility] requires: All fingernail and toenail products should be removed before the day of surgery.  (SNS, Acrylic, Gel, Polish, Stickers, Press on, and Poly gel nails.)   Parking information:  Address: [facility] - [address], [place], [place] [address]  Please look for signs for entrance A off of [address]. Free valet parking is available Monday-Friday 05:30am-06:00pm     

## 2023-03-04 NOTE — Progress Notes (Signed)
Surgical Instructions    Your procedure is scheduled on Tuesday June 11,2024.  Report to [facility] Main Entrance "A" at 7:35 A.M., then check in with the Admitting office.  Call this number if you have problems the morning of surgery:  [phone]   If you have any questions prior to your surgery date call [phone]: Open Monday-Friday 8am-4pm If you experience any cold or flu symptoms such as cough, fever, chills, shortness of breath, etc. between now and your scheduled surgery, please notify us at the above number     Remember:  Do not eat after midnight the night before your surgery  You may drink clear liquids until 6:35 the morning of your surgery.   Clear liquids allowed are: Water, Non-Citrus Juices (without pulp), Carbonated Beverages, Clear Tea, Black Coffee ONLY (NO MILK, CREAM OR POWDERED CREAMER of any kind), and Gatorade    Enhanced Recovery after Surgery for Orthopedics Enhanced Recovery after Surgery is a protocol used to improve the stress on your body and your recovery after surgery.  Patient Instructions  The day of surgery (if you do NOT have diabetes):  Drink ONE (1) Pre-Surgery Clear Ensure by 6:35 am the morning of surgery   This drink was given to you during your hospital  pre-op appointment visit. Nothing else to drink after completing the  Pre-Surgery Clear Ensure.         If you have questions, please contact your surgeon's office.    Take these medicines the morning of surgery with A SIP OF WATER:  acetaminophen (TYLENOL)  amLODipine (NORVASC)  fexofenadine (ALLEGRA)   As of today, STOP taking any Aspirin (unless otherwise instructed by your surgeon) Aleve, Naproxen, Ibuprofen, Motrin, Advil, Goody's, BC's, all herbal medications, fish oil, and all vitamins.   Special instructions:    Oral Hygiene is also important to reduce your risk of infection.  Remember - BRUSH YOUR TEETH THE MORNING OF SURGERY WITH YOUR REGULAR  TOOTHPASTE    Pre-operative 5 CHG Bath Instructions   You can play a key role in reducing the risk of infection after surgery. Your skin needs to be as free of germs as possible. You can reduce the number of germs on your skin by washing with CHG (chlorhexidine gluconate) soap before surgery. CHG is an antiseptic soap that kills germs and continues to kill germs even after washing.   DO NOT use if you have an allergy to chlorhexidine/CHG or antibacterial soaps. If your skin becomes reddened or irritated, stop using the CHG and notify one of our RNs at [phone].   Please shower with the CHG soap starting 4 days before surgery using the following schedule:     Please keep in mind the following:  DO NOT shave, including legs and underarms, starting the day of your first shower.   You may shave your face at any point before/day of surgery.  Place clean sheets on your bed the day you start using CHG soap. Use a clean washcloth (not used since being washed) for each shower. DO NOT sleep with pets once you start using the CHG.   CHG Shower Instructions:  If you choose to wash your hair and private area, wash first with your normal shampoo/soap.  After you use shampoo/soap, rinse your hair and body thoroughly to remove shampoo/soap residue.  Turn the water OFF and apply about 3 tablespoons (45 ml) of CHG soap to a CLEAN washcloth.  Apply CHG soap ONLY FROM YOUR NECK DOWN TO YOUR  TOES (washing for 3-5 minutes)  DO NOT use CHG soap on face, private areas, open wounds, or sores.  Pay special attention to the area where your surgery is being performed.  If you are having back surgery, having someone wash your back for you may be helpful. Wait 2 minutes after CHG soap is applied, then you may rinse off the CHG soap.  Pat dry with a clean towel  Put on clean clothes/pajamas   If you choose to wear lotion, please use ONLY the CHG-compatible lotions on the back of this paper.     Additional  instructions for the day of surgery: DO NOT APPLY any lotions, deodorants, cologne, or perfumes.   Put on clean/comfortable clothes.  Brush your teeth.  Ask your nurse before applying any prescription medications to the skin.      CHG Compatible Lotions   Aveeno Moisturizing lotion  Cetaphil Moisturizing Cream  Cetaphil Moisturizing Lotion  Clairol Herbal Essence Moisturizing Lotion, Dry Skin  Clairol Herbal Essence Moisturizing Lotion, Extra Dry Skin  Clairol Herbal Essence Moisturizing Lotion, Normal Skin  Curel Age Defying Therapeutic Moisturizing Lotion with Alpha Hydroxy  Curel Extreme Care Body Lotion  Curel Soothing Hands Moisturizing Hand Lotion  Curel Therapeutic Moisturizing Cream, Fragrance-Free  Curel Therapeutic Moisturizing Lotion, Fragrance-Free  Curel Therapeutic Moisturizing Lotion, Original Formula  Eucerin Daily Replenishing Lotion  Eucerin Dry Skin Therapy Plus Alpha Hydroxy Crme  Eucerin Dry Skin Therapy Plus Alpha Hydroxy Lotion  Eucerin Original Crme  Eucerin Original Lotion  Eucerin Plus Crme Eucerin Plus Lotion  Eucerin TriLipid Replenishing Lotion  Keri Anti-Bacterial Hand Lotion  Keri Deep Conditioning Original Lotion Dry Skin Formula Softly Scented  Keri Deep Conditioning Original Lotion, Fragrance Free Sensitive Skin Formula  Keri Lotion Fast Absorbing Fragrance Free Sensitive Skin Formula  Keri Lotion Fast Absorbing Softly Scented Dry Skin Formula  Keri Original Lotion  Keri Skin Renewal Lotion Keri Silky Smooth Lotion  Keri Silky Smooth Sensitive Skin Lotion  Nivea Body Creamy Conditioning Oil  Nivea Body Extra Enriched Lotion  Nivea Body Original Lotion  Nivea Body Sheer Moisturizing Lotion Nivea Crme  Nivea Skin Firming Lotion  NutraDerm 30 Skin Lotion  NutraDerm Skin Lotion  NutraDerm Therapeutic Skin Cream  NutraDerm Therapeutic Skin Lotion  ProShield Protective Hand Cream  Provon moisturizing lotion  Day of Surgery:  Take a  shower with CHG soap. Wear Clean/Comfortable clothing the morning of surgery Do not apply any deodorants/lotions.   Remember to brush your teeth WITH YOUR REGULAR TOOTHPASTE.  Do not wear jewelry or makeup. Do not wear lotions, powders, perfumes/cologne or deodorant. Do not shave 48 hours prior to surgery.  Men may shave face and neck. Do not bring valuables to the hospital. Do not wear nail polish, gel polish, artificial nails, or any other type of covering on natural nails (fingers and toes) If you have artificial nails or gel coating that need to be removed by a nail salon, please have this removed prior to surgery. Artificial nails or gel coating may interfere with anesthesia's ability to adequately monitor your vital signs.  [facility] is not responsible for any belongings or valuables.    Do NOT Smoke (Tobacco/Vaping)  24 hours prior to your procedure  If you use a CPAP at night, you may bring your mask for your overnight stay.   Contacts, glasses, hearing aids, dentures or partials may not be worn into surgery, please bring cases for these belongings   For patients admitted to the hospital,  discharge time will be determined by your treatment team.   Patients discharged the day of surgery will not be allowed to drive home, and someone needs to stay with them for 24 hours.   SURGICAL WAITING ROOM VISITATION Patients having surgery or a procedure may have no more than 2 support people in the waiting area - these visitors may rotate.   Children under the age of 40 must have an adult with them who is not the patient. If the patient needs to stay at the hospital during part of their recovery, the visitor guidelines for inpatient rooms apply. Pre-op nurse will coordinate an appropriate time for 1 support person to accompany patient in pre-op.  This support person may not rotate.   Please refer to [email or web address] for the visitor  guidelines for Inpatients (after your surgery is over and you are in a regular room).   If you received a COVID test during your pre-op visit, it is requested that you wear a mask when out in public, stay away from anyone that may not be feeling well, and notify your surgeon if you develop symptoms. If you have been in contact with anyone that has tested positive in the last 10 days, please notify your surgeon.    Please read over the following fact sheets that you were given.

## 2023-03-05 ENCOUNTER — Encounter (HOSPITAL_COMMUNITY)
Admission: RE | Admit: 2023-03-05 | Discharge: 2023-03-05 | Disposition: A | Discharge status: Home / Self Care | Payer: Medicare HMO | Source: Ambulatory Visit | Attending: Orthopaedic Surgery | Admitting: Orthopaedic Surgery

## 2023-03-05 ENCOUNTER — Other Ambulatory Visit: Payer: Self-pay

## 2023-03-05 ENCOUNTER — Encounter (HOSPITAL_COMMUNITY): Payer: Self-pay

## 2023-03-05 VITALS — BP 137/61 | HR 79 | Temp 98.1°F | Resp 18 | Ht 62.0 in | Wt 194.8 lb

## 2023-03-05 DIAGNOSIS — M1711 Unilateral primary osteoarthritis, right knee: Secondary | ICD-10-CM

## 2023-03-05 DIAGNOSIS — Z01818 Encounter for other preprocedural examination: Secondary | ICD-10-CM | POA: Diagnosis not present

## 2023-03-05 LAB — SURGICAL PCR SCREEN
MRSA, PCR: NEGATIVE
Staphylococcus aureus: NEGATIVE

## 2023-03-05 LAB — COMPREHENSIVE METABOLIC PANEL
ALT: 16 U/L (ref 0–44)
AST: 17 U/L (ref 15–41)
Albumin: 3.9 g/dL (ref 3.5–5.0)
Alkaline Phosphatase: 64 U/L (ref 38–126)
Anion gap: 9 (ref 5–15)
BUN: 17 mg/dL (ref 8–23)
CO2: 26 mmol/L (ref 22–32)
Calcium: 8.8 mg/dL — ABNORMAL LOW (ref 8.9–10.3)
Chloride: 104 mmol/L (ref 98–111)
Creatinine, Ser: 0.83 mg/dL (ref 0.44–1.00)
GFR, Estimated: >60 mL/min (ref >60)
Glucose, Bld: 143 mg/dL — ABNORMAL HIGH (ref 70–99)
Potassium: 3.7 mmol/L (ref 3.5–5.1)
Sodium: 139 mmol/L (ref 135–145)
Total Bilirubin: 0.4 mg/dL (ref 0.3–1.2)
Total Protein: 6.7 g/dL (ref 6.5–8.1)

## 2023-03-05 LAB — CBC
HCT: 38.8 % (ref 36.0–46.0)
Hemoglobin: 12.5 g/dL (ref 12.0–15.0)
MCH: 30.8 pg (ref 26.0–34.0)
MCHC: 32.2 g/dL (ref 30.0–36.0)
MCV: 95.6 fL (ref 80.0–100.0)
Platelets: 226 10*3/uL (ref 150–400)
RBC: 4.06 MIL/uL (ref 3.87–5.11)
RDW: 11.8 % (ref 11.5–15.5)
WBC: 5.4 10*3/uL (ref 4.0–10.5)
nRBC: 0 % (ref 0.0–0.2)

## 2023-03-05 NOTE — Progress Notes (Signed)
PCP - Janyth Pupa P. Wendling,MD Cardiologist - denies  PPM/ICD - denies Device Orders -  Rep Notified -   Chest x-ray - 11/16/22 EKG - 09/28/22 Stress Test - none ECHO - none Cardiac Cath - none  Sleep Study - denies CPAP - no  Fasting Blood Sugar - na Checks Blood Sugar _____ times a day  Last dose of GLP1 agonist-  na GLP1 instructions: na  Blood Thinner Instructions:na Aspirin Instructions:na  ERAS Protcol  - clears until 0635 PRE-SURGERY Ensure or G2- Ensure  COVID TEST- na   Anesthesia review: no  Patient denies shortness of breath, fever, cough and chest pain at PAT appointment   All instructions explained to the patient, with a verbal understanding of the material. Patient agrees to go over the instructions while at home for a better understanding. Patient also instructed to wear a mask when out in public when out in public prior to surgery. The opportunity to ask questions was provided.

## 2023-03-07 ENCOUNTER — Telehealth: Payer: Self-pay | Admitting: *Deleted

## 2023-03-07 NOTE — Telephone Encounter (Signed)
See care plan note

## 2023-03-07 NOTE — Care Plan (Signed)
Spoke with patient who was scheduled for a Right Total knee arthroplasty on 03/11/23 with Dr. Magnus Ivan. 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 She just feels it is not the right time to have her surgery. Updated scheduler and Dr. Magnus Ivan. She is planning on calling when she is ready. Most likely she will like to have done at the beginning of next summer. Surgery now canceled for next week.

## 2023-03-10 ENCOUNTER — Ambulatory Visit: Payer: Medicare HMO

## 2023-03-10 ENCOUNTER — Ambulatory Visit (HOSPITAL_BASED_OUTPATIENT_CLINIC_OR_DEPARTMENT_OTHER)
Admission: RE | Admit: 2023-03-10 | Discharge: 2023-03-10 | Disposition: A | Discharge status: Home / Self Care | Payer: Medicare HMO | Source: Ambulatory Visit | Attending: Diagnostic Radiology | Admitting: Diagnostic Radiology

## 2023-03-10 DIAGNOSIS — I700 Atherosclerosis of aorta: Secondary | ICD-10-CM | POA: Diagnosis not present

## 2023-03-10 DIAGNOSIS — J439 Emphysema, unspecified: Secondary | ICD-10-CM | POA: Insufficient documentation

## 2023-03-10 DIAGNOSIS — Z122 Encounter for screening for malignant neoplasm of respiratory organs: Secondary | ICD-10-CM | POA: Insufficient documentation

## 2023-03-10 DIAGNOSIS — Z87891 Personal history of nicotine dependence: Secondary | ICD-10-CM | POA: Diagnosis not present

## 2023-03-11 ENCOUNTER — Encounter (HOSPITAL_COMMUNITY): Admission: RE | Payer: Self-pay | Source: Home / Self Care

## 2023-03-11 ENCOUNTER — Ambulatory Visit (HOSPITAL_COMMUNITY): Admission: RE | Admit: 2023-03-11 | Payer: Medicare HMO | Source: Home / Self Care | Admitting: Orthopaedic Surgery

## 2023-03-11 DIAGNOSIS — M1711 Unilateral primary osteoarthritis, right knee: Secondary | ICD-10-CM

## 2023-03-11 SURGERY — ARTHROPLASTY, KNEE, TOTAL
Anesthesia: Spinal | Site: Knee | Laterality: Right

## 2023-03-17 ENCOUNTER — Other Ambulatory Visit: Payer: Self-pay

## 2023-03-17 DIAGNOSIS — Z87891 Personal history of nicotine dependence: Secondary | ICD-10-CM

## 2023-03-17 DIAGNOSIS — Z122 Encounter for screening for malignant neoplasm of respiratory organs: Secondary | ICD-10-CM

## 2023-03-24 ENCOUNTER — Encounter: Payer: Medicare HMO | Admitting: Orthopaedic Surgery

## 2023-03-31 ENCOUNTER — Other Ambulatory Visit: Payer: Self-pay | Admitting: Family Medicine

## 2023-03-31 DIAGNOSIS — I10 Essential (primary) hypertension: Secondary | ICD-10-CM

## 2023-07-21 ENCOUNTER — Encounter: Payer: Self-pay | Admitting: Family Medicine

## 2023-08-04 ENCOUNTER — Other Ambulatory Visit: Payer: Self-pay | Admitting: Family Medicine

## 2023-08-04 DIAGNOSIS — I10 Essential (primary) hypertension: Secondary | ICD-10-CM

## 2023-08-11 ENCOUNTER — Ambulatory Visit (INDEPENDENT_AMBULATORY_CARE_PROVIDER_SITE_OTHER): Payer: Medicare HMO | Admitting: Orthopaedic Surgery

## 2023-08-11 DIAGNOSIS — M25561 Pain in right knee: Secondary | ICD-10-CM | POA: Diagnosis not present

## 2023-08-11 DIAGNOSIS — M1711 Unilateral primary osteoarthritis, right knee: Secondary | ICD-10-CM

## 2023-08-11 DIAGNOSIS — G8929 Other chronic pain: Secondary | ICD-10-CM

## 2023-08-11 NOTE — Progress Notes (Signed)
The patient is a 77 year old female well-known to [place].  We replaced her left knee back in June 2023 due to severe arthritis in that knee is done very well.  She has known severe arthritis in her right knee.  She has tried and failed other forms of conservative treatment for the right knee but has not had any hyaluronic acid for that knee.  She has had steroid injections which no longer work.  She is wanting to discuss hyaluronic acid for her right knee.  She has had a friend who said that recently and it did help her friend with long-term pain from osteoarthritis in her knee joint.  Her left operative knee moves smoothly and fluidly with full range of motion is nice and straight.  Her right knee has varus malalignment that is correctable.  There is medial joint line tenderness from known arthritis of the medial joint line and patellofemoral joint.  There is a lot of crepitation with flexion and extension of her right knee.  Her right knee is ligamentously stable.  Previous x-rays of the right knee show significant tricompartment arthritis of the right knee.  At this point I agree with her being a candidate for hyaluronic acid for her right knee given the failure of previous conservative treatment measures including steroid injection.  We will see if we get this approved and ordered for her.  She would like to try to have this done on December night since she will have that day all further doctors appointment so we will see what we can do.  This patient is diagnosed with osteoarthritis of the knee(s).    Radiographs show evidence of joint space narrowing, osteophytes, subchondral sclerosis and/or subchondral cysts.  This patient has knee pain which interferes with functional and activities of daily living.    This patient has experienced inadequate response, adverse effects and/or intolerance with conservative treatments such as acetaminophen, NSAIDS, topical creams, physical therapy or regular exercise,  knee bracing and/or weight loss.   This patient has experienced inadequate response or has a contraindication to intra articular steroid injections for at least 3 months.   This patient is not scheduled to have a total knee replacement within 6 months of starting treatment with viscosupplementation.

## 2023-08-13 ENCOUNTER — Telehealth: Payer: Self-pay

## 2023-08-13 NOTE — Telephone Encounter (Signed)
Right knee gel injection; for Dec 9?

## 2023-08-13 NOTE — Telephone Encounter (Signed)
VOB submitted for Monovisc, right knee  

## 2023-09-06 ENCOUNTER — Encounter: Payer: Self-pay | Admitting: Family Medicine

## 2023-09-08 ENCOUNTER — Other Ambulatory Visit: Payer: Self-pay

## 2023-09-08 ENCOUNTER — Ambulatory Visit: Payer: Medicare HMO | Admitting: Orthopaedic Surgery

## 2023-09-08 ENCOUNTER — Encounter: Payer: Self-pay | Admitting: Orthopaedic Surgery

## 2023-09-08 DIAGNOSIS — M1711 Unilateral primary osteoarthritis, right knee: Secondary | ICD-10-CM

## 2023-09-08 MED ORDER — HYALURONAN 88 MG/4ML IX SOSY
88.0000 mg | PREFILLED_SYRINGE | INTRA_ARTICULAR | Status: AC | PRN
Start: 2023-09-08 — End: 2023-09-08
  Administered 2023-09-08: 88 mg via INTRA_ARTICULAR

## 2023-09-08 MED ORDER — LIDOCAINE HCL 1 % IJ SOLN
3.0000 mL | INTRAMUSCULAR | Status: AC | PRN
Start: 2023-09-08 — End: 2023-09-08
  Administered 2023-09-08: 3 mL

## 2023-09-08 NOTE — Progress Notes (Signed)
   Procedure Note  Patient: Kelly Dixon             Date of Birth: 03/20/1946           MRN: [id number]             Visit Date: 09/08/2023  Procedures: Visit Diagnoses:  1. Unilateral primary osteoarthritis, right knee     Large Joint Inj: R knee on 09/08/2023 2:42 PM Indications: diagnostic evaluation and pain Details: 22 G 1.5 in needle, superolateral approach  Arthrogram: No  Medications: 3 mL lidocaine 1 %; 88 mg Hyaluronan 88 MG/4ML Outcome: tolerated well, no immediate complications Procedure, treatment alternatives, risks and benefits explained, specific risks discussed. Consent was given by the patient. Immediately prior to procedure a time out was called to verify the correct patient, procedure, equipment, support staff and site/side marked as required. Patient was prepped and draped in the usual sterile fashion.    The patient comes in today for scheduled hyaluronic acid injection in her right knee to treat the pain from osteoarthritis.  She would like to consider joint replacement surgery may be in June of next year.  She is going to travel on a long plane flight to see her son in April of next year.  She has had not good relief with steroid injections.  Her right knee she has no effusion.  There is obvious osteoarthritis with pain in all 3 compartments and patellofemoral crepitation.  I did place Monovisc in the right knee without difficulty.  We will see her back in March 2025 and I would like a weight and BMI calculation at that visit as well as a standing AP and lateral of her right knee.

## 2023-09-26 ENCOUNTER — Other Ambulatory Visit: Payer: Self-pay | Admitting: Adult Health

## 2023-09-26 DIAGNOSIS — N6310 Unspecified lump in the right breast, unspecified quadrant: Secondary | ICD-10-CM

## 2023-10-02 ENCOUNTER — Other Ambulatory Visit: Payer: Self-pay | Admitting: Adult Health

## 2023-10-02 ENCOUNTER — Ambulatory Visit
Admission: RE | Admit: 2023-10-02 | Discharge: 2023-10-02 | Disposition: A | Discharge status: Home / Self Care | Payer: Medicare HMO | Source: Ambulatory Visit | Attending: Adult Health | Admitting: Adult Health

## 2023-10-02 DIAGNOSIS — N6320 Unspecified lump in the left breast, unspecified quadrant: Secondary | ICD-10-CM

## 2023-10-02 DIAGNOSIS — N6325 Unspecified lump in the left breast, overlapping quadrants: Secondary | ICD-10-CM | POA: Diagnosis not present

## 2023-10-02 DIAGNOSIS — N6310 Unspecified lump in the right breast, unspecified quadrant: Secondary | ICD-10-CM

## 2023-10-02 DIAGNOSIS — R921 Mammographic calcification found on diagnostic imaging of breast: Secondary | ICD-10-CM | POA: Diagnosis not present

## 2023-10-02 DIAGNOSIS — N6042 Mammary duct ectasia of left breast: Secondary | ICD-10-CM | POA: Diagnosis not present

## 2023-10-02 DIAGNOSIS — Z853 Personal history of malignant neoplasm of breast: Secondary | ICD-10-CM | POA: Diagnosis not present

## 2023-10-02 DIAGNOSIS — C50812 Malignant neoplasm of overlapping sites of left female breast: Secondary | ICD-10-CM | POA: Diagnosis not present

## 2023-10-02 HISTORY — PX: BREAST BIOPSY: SHX20

## 2023-10-03 ENCOUNTER — Telehealth: Payer: Self-pay

## 2023-10-03 LAB — SURGICAL PATHOLOGY

## 2023-10-03 NOTE — Telephone Encounter (Addendum)
 Called pt with message below. Pt states that she has not seen an Oncologist because she didn't need too. Pt says she just had an biopsy done yesterday and hopefully she doesn't need to make an appointment. I advise pt to contact us  back when she finish everything to get scheduled if she needs to be. Pt verbalized understanding.----- Message from Morna JAYSON Kendall sent at 10/02/2023  2:45 PM EST ----- Has not been seen in medical oncology for the past 4 years.  Can you check with patient about a f/u?  Thanks LC ----- Message ----- From: Interface, Rad Results In Sent: 10/02/2023   1:20 PM EST To: Morna Dalton Kendall, NP

## 2023-10-07 ENCOUNTER — Other Ambulatory Visit: Payer: Self-pay | Admitting: *Deleted

## 2023-10-07 DIAGNOSIS — C50412 Malignant neoplasm of upper-outer quadrant of left female breast: Secondary | ICD-10-CM | POA: Insufficient documentation

## 2023-10-09 ENCOUNTER — Telehealth: Payer: Self-pay | Admitting: *Deleted

## 2023-10-09 ENCOUNTER — Telehealth: Payer: Self-pay | Admitting: Hematology and Oncology

## 2023-10-09 NOTE — Telephone Encounter (Signed)
 Called and left VM for patient to call back to schedule an appointment.

## 2023-10-09 NOTE — Telephone Encounter (Signed)
 Left VM for a return phone call to answer questions she may have.

## 2023-10-10 ENCOUNTER — Telehealth: Payer: Self-pay | Admitting: *Deleted

## 2023-10-10 ENCOUNTER — Encounter: Payer: Self-pay | Admitting: *Deleted

## 2023-10-10 NOTE — Telephone Encounter (Signed)
 Late entry from 1/9 - patient returned call and discussed the reasoning for multiple appts with med,rad onc and surgery to complete plan of care.  Patient verbalized understanding. Discussed navigation resources and gave contact information. Encouraged her to call should she have any questions or concerns.

## 2023-10-10 NOTE — Progress Notes (Signed)
 Location of Breast Cancer: Malignant neoplasm of left breast, estrogen receptor positive (Previous history of: Malignant neoplasm of upper-outer quadrant of right breast in female, estrogen receptor positive; treated with surgery and antiestrogen therapy)  Histology per Pathology Report:  (Definitive pathology pending eventual surgery) 10/02/2023  1. Breast, left, needle core biopsy, 12 o'clock 7cmfn coil clip :       -  INVASIVE MAMMARY CARCINOMA       -  MAMMARY CARCINOMA IN-SITU  ADDENDUM  Breast, left, needle core biopsy, 12 o'clock 7cmfn coil clip   Receptor Status: ER(100%), PR (25%), Her2-neu (Negative), Ki-67(8%)  Did patient present with symptoms (if so, please note symptoms) or was this found on screening mammography?: Screening mammogram  Past/Anticipated interventions by surgeon, if any:  Scheduled to meet with Dr. Donnice Lima later this morning  Past/Anticipated interventions by medical oncology, if any:  Scheduled to meet with Dr. Mackey Chad later today   Lymphedema issues, if any:  Denies     Pain issues, if any:  Denies   SAFETY ISSUES: Prior radiation? No--(Declined when right breast was treated in 2021)  Pacemaker/ICD? No Possible current pregnancy? No--postmenopausal Is the patient on methotrexate? No  Current Complaints / other details:  Nothing else of note

## 2023-10-12 NOTE — Progress Notes (Signed)
 Radiation Oncology         [phone] ________________________________  Initial Outpatient Consultation  Name: Kelly Dixon MRN: [id number]  Date: 10/13/2023  DOB: 17-Oct-1945  RR:Tzwiopwh, Mabel Mt, DO  Odean Potts, MD   REFERRING PHYSICIAN: Odean Potts, MD  DIAGNOSIS:    ICD-10-CM   1. Malignant neoplasm of upper-outer quadrant of left breast in female, estrogen receptor positive (HCC)  C50.412    Z17.0        Cancer Staging  Malignant neoplasm of upper-outer quadrant of left female breast [facility]) Staging form: Breast, AJCC 8th Edition - Clinical stage from 10/13/2023: Stage IA (cT1b, cN0, cM0, G2, ER+, PR+, HER2-) - Signed by Izell Domino, MD on 10/13/2023 Stage prefix: Initial diagnosis Histologic grading system: 3 grade system  Malignant neoplasm of upper-outer quadrant of right breast in female, estrogen receptor positive (HCC) Staging form: Breast, AJCC 8th Edition - Clinical: Stage IA (cT1b, cN0, cM0, G2, ER+, PR+, HER2-) - Signed by Crawford Morna Pickle, NP on 09/08/2019 Stage prefix: Initial diagnosis Histologic grading system: 3 grade system - Pathologic stage from 09/15/2019: Stage IA (pT1b, pN0, cM0, G2, ER+, PR+, HER2-) - Signed by Crawford Morna Pickle, NP on 12/15/2019 Stage prefix: Initial diagnosis Histologic grading system: 3 grade system   Stage IA T1bN0M0 Left Breast UOQ,  Invasive and in situ mammary carcinoma (E-cadherin pending) ER+ / PR+ / Her2-, Grade 2  Prior history of Stage IA (pT1b, pN0) Right Breast UOQ Invasive Ductal Carcinoma with DCIS, ER+ / PR+ / Her2-, Grade 2: s/p right breast lumpectomy and antiestrogen therapy with anastrozole     CHIEF COMPLAINT: Here to discuss management of left breast cancer  HISTORY OF PRESENT ILLNESS::Kelly Dixon is a 78 y.o. female with a history of right breast cancer (IDC) diagnosed in 2020, s/p breast conserving surgery followed by antiestrogen therapy under Dr. Odean. I actually met with her  in consultation in January of 2021, however she opted against radiation therapy*.    Since that time, she has continued with annual surveillance imaging and has remained without evidence of disease recurrence until presently. Of note: a bilateral diagnostic mammogram and right breast ultrasound performed in January 2024 did show a 4 mm mass in the 10:30 o'clock right breast. A biopsy was attempted of the mass, however the mass was not able to be located/reproduced at the time of the attempted biopsy.   Earlier this month, the patient presented for a routine bilateral diagnostic mammogram and left breast ultrasound on 10/02/23 which showed: an approximately 0.6 cm mass in the 12 o'clock left breast, 7 cmfn. Mammographically, an area of duct ectasia was also demonstrated in the more anterior 12 o'clock left breast, which was not appreciable sonographically. Imaging otherwise showed no evidence of left axillary lymphadenopathy.   Biopsy of the 12 o'clock left breast (7 cmfn) on date of 10/02/23 showed grade 2 invasive mammary carcinoma measuring 1.0 cm in the greatest linear extent of the sample with MCIS.  ER status: 100% positive with strong staining intensity; PR status 25% positive with moderate staining intensity; Proliferation marker Ki67 at 8%; Her2 status negative; Grade 2. -- E-cadherin results are pending at this time. No lymph nodes were biopsied.   Referrals have been made to general surgery and med-onc, and she will be meeting with Dr. Belinda and Dr. [name] later today to discuss further treatment options.   She works as a lawyer and swims for exercise.  *Notably, she reports that she only tolerated her  antiestrogen therapy for a matter of weeks before she had to stop it in 2020 due to intolerance.  She has reservations about starting antiestrogen therapy again.  PREVIOUS RADIATION THERAPY: No  PAST MEDICAL HISTORY:  has a past medical history of Allergy (1996), Anemia, Arthritis,  Breast cancer (HCC) (2020), Cataract (May 2018), Gallstones (05/31/2018), GERD (gastroesophageal reflux disease), Hypertension, Personal history of radiation therapy, PONV (postoperative nausea and vomiting), and Syphilis.    PAST SURGICAL HISTORY: Past Surgical History:  Procedure Laterality Date   BREAST BIOPSY Right 08/2019   BREAST BIOPSY Left 10/02/2023   US  LT BREAST BX W LOC DEV 1ST LESION IMG BX SPEC US  GUIDE 10/02/2023 [facility] M[facility]MOGRAPHY   BREAST LUMPECTOMY Right 08/2019   BREAST LUMPECTOMY WITH RADIOACTIVE SEED AND SENTINEL LYMPH NODE BIOPSY Right 09/15/2019   Procedure: RIGHT BREAST LUMPECTOMY WITH RADIOACTIVE SEED AND SENTINEL LYMPH NODE BIOPSY;  Surgeon: Belinda Cough, MD;  Location: [facility];  Service: General;  Laterality: Right;   CHOLECYSTECTOMY N/A 04/13/2019   Procedure: LAPAROSCOPIC CHOLECYSTECTOMY WITH INTRAOPERATIVE CHOLANGIOGRAM;  Surgeon: Belinda Cough, MD;  Location: [facility] OR;  Service: General;  Laterality: N/A;   DILATION AND CURETTAGE OF UTERUS     JOINT REPLACEMENT  June 2023   TOTAL KNEE ARTHROPLASTY Left 03/26/2022   Procedure: LEFT TOTAL KNEE ARTHROPLASTY;  Surgeon: Vernetta Lonni GRADE, MD;  Location: [facility] OR;  Service: Orthopedics;  Laterality: Left;   TUBAL LIGATION  1974    FAMILY HISTORY: family history includes Arthritis in her mother and sister; Hearing loss in her mother; Heart disease in her father.  SOCIAL HISTORY:  reports that she quit smoking about 3 years ago. Her smoking use included cigarettes. She started smoking about 58 years ago. She has a 27.1 pack-year smoking history. She has never used smokeless tobacco. She reports that she does not currently use alcohol. She reports that she does not use drugs.  ALLERGIES: Patient has no known allergies.  MEDICATIONS:  Current Outpatient Medications  Medication Sig Dispense Refill   acetaminophen  (TYLENOL ) 500 MG tablet Take 1,000 mg by mouth in the morning and at bedtime.      amLODipine  (NORVASC ) 5 MG tablet TAKE 1 TABLET(5 MG) BY MOUTH DAILY 30 tablet 3   cyanocobalamin  1000 MCG tablet Take 1 tablet (1,000 mcg total) by mouth daily. 30 tablet 0   ferrous sulfate  325 (65 FE) MG EC tablet Take 1 tablet (325 mg total) by mouth daily with breakfast. 30 tablet 3   fexofenadine (ALLEGRA) 180 MG tablet Take 180 mg by mouth in the morning.     Psyllium-Calcium  (FIBER PLUS CALCIUM  PO) Take 2 tablets by mouth in the morning. Chewables     No current facility-administered medications for this encounter.    REVIEW OF SYSTEMS: As above in HPI.   PHYSICAL EXAM:  height is 5' 2 (1.575 m) and weight is 207 lb 6 oz (94.1 kg). Her temporal temperature is 97.1 F (36.2 C) (abnormal). Her blood pressure is 139/80 and her pulse is 65. Her respiration is 18 and oxygen saturation is 97%.   General: Alert and oriented, in no acute distress HEENT: Head is normocephalic. Extraocular movements are intact.  Musculoskeletal:  Well-nourished and ambulatory Neurologic: Cranial nerves II through XII are grossly intact. No obvious focalities. Speech is fluent. Coordination is intact. Psychiatric: Judgment and insight are intact. Affect is appropriate. Breasts: no palpable masses appreciated in the breasts or axillae bilaterally .  ECOG = 0  0 - Asymptomatic (Fully  active, able to carry on all predisease activities without restriction)  1 - Symptomatic but completely ambulatory (Restricted in physically strenuous activity but ambulatory and able to carry out work of a light or sedentary nature. For example, light housework, office work)  2 - Symptomatic, <50% in bed during the day (Ambulatory and capable of all self care but unable to carry out any work activities. Up and about more than 50% of waking hours)  3 - Symptomatic, >50% in bed, but not bedbound (Capable of only limited self-care, confined to bed or chair 50% or more of waking hours)  4 - Bedbound (Completely disabled. Cannot  carry on any self-care. Totally confined to bed or chair)  5 - Death   Raylene MM, Creech RH, Tormey DC, et al. [phone]). Toxicity and response criteria of the [facility]. Am. DOROTHA Bridges. Oncol. 5 (6): 649-55   LABORATORY DATA:  Lab Results  Component Value Date   WBC 5.4 03/05/2023   HGB 12.5 03/05/2023   HCT 38.8 03/05/2023   MCV 95.6 03/05/2023   PLT 226 03/05/2023   CMP     Component Value Date/Time   NA 139 03/05/2023 1400   K 3.7 03/05/2023 1400   CL 104 03/05/2023 1400   CO2 26 03/05/2023 1400   GLUCOSE 143 (H) 03/05/2023 1400   BUN 17 03/05/2023 1400   CREATININE 0.83 03/05/2023 1400   CREATININE 0.79 07/22/2019 0823   CALCIUM  8.8 (L) 03/05/2023 1400   PROT 6.7 03/05/2023 1400   ALBUMIN 3.9 03/05/2023 1400   AST 17 03/05/2023 1400   ALT 16 03/05/2023 1400   ALKPHOS 64 03/05/2023 1400   BILITOT 0.4 03/05/2023 1400   GFRNONAA >60 03/05/2023 1400   GFRAA >60 04/06/2019 1020         RADIOGRAPHY: US  LT BREAST BX W LOC DEV 1ST LESION IMG BX SPEC US  GUIDE Addendum Date: 10/03/2023 ADDENDUM REPORT: 10/03/2023 16:12 ADDENDUM: Pathology revealed GRADE II INVASIVE MAMMARY CARCINOMA, MAMMARY CARCINOMA IN-SITU of the LEFT breast, 12 o'clock, 7cmfn, (coil clip). This was found to be concordant by Dr. Inocente Ast. Pathology results were discussed with the patient by telephone. The patient reported doing well after the biopsy with tenderness at the site. Post biopsy instructions and care were reviewed and questions were answered. The patient was encouraged to call [facility] for any additional concerns. My direct phone number was provided. Surgical consultation request for Dr. Donnice Lima, per patient request, was sent to [facility] via secure EPIC message on October 03, 2023. Medical oncology consultation has been requested with Dr. Vinay [name], per patient request, at [facility] via secure EPIC  message on October 03, 2023. NOTE: Left breast 12 o'clock suspected duct ectasia containing scattered calcifications. If breast conservation is considered, recommend further evaluation with breast MRI or stereotactic core needle biopsy. Pathology results reported by Hendricks Benders, RN on 10/03/2023. Electronically Signed   By: Inocente Ast M.D.   On: 10/03/2023 16:12   Result Date: 10/03/2023 CLINICAL DATA:  78 year old female presenting for biopsy of a mass in the left breast. EXAM: ULTRASOUND GUIDED LEFT BREAST CORE NEEDLE BIOPSY COMPARISON:  Previous exam(s). PROCEDURE: I met with the patient and we discussed the procedure of ultrasound-guided biopsy, including benefits and alternatives. We discussed the high likelihood of a successful procedure. We discussed the risks of the procedure, including infection, bleeding, tissue injury, clip migration, and inadequate sampling. Informed written consent was given. The  usual time-out protocol was performed immediately prior to the procedure. Lesion quadrant: Upper inner quadrant Using sterile technique and 1% Lidocaine  as local anesthetic, under direct ultrasound visualization, a 14 gauge spring-loaded device was used to perform biopsy of mass in the left breast at 12 o'clock using a lateral approach. At the conclusion of the procedure coil shaped tissue marker clip was deployed into the biopsy cavity. Follow up 2 view mammogram was performed and dictated separately. IMPRESSION: Ultrasound guided biopsy of a mass in the left breast at 12 o'clock. No apparent complications. Electronically Signed: By: Inocente Ast M.D. On: 10/02/2023 15:25   MM CLIP PLACEMENT LEFT Result Date: 10/02/2023 CLINICAL DATA:  Post procedure mammogram for clip placement EXAM: 3D DIAGNOSTIC LEFT MAMMOGRAM POST ULTRASOUND BIOPSY COMPARISON:  Previous exam(s). FINDINGS: 3D Mammographic images were obtained following ultrasound guided biopsy of a mass in the left breast at 12 o'clock. The  coil biopsy marking clip is in expected position at the site of biopsy. IMPRESSION: Appropriate positioning of the coil biopsy marking clip at the site of biopsy in the left breast at 12 o'clock. Final Assessment: Post Procedure Mammograms for Marker Placement Electronically Signed   By: Inocente Ast M.D.   On: 10/02/2023 15:24   MM 3D DIAGNOSTIC MAMMOGRAM BILATERAL BREAST Result Date: 10/02/2023 CLINICAL DATA:  History of treated right breast cancer, status post breast conservation therapy in 2020. In January 2024, a if 4 mm mass was discovered in patient's right breast 10:30 o'clock. Subsequent biopsy was attempted but was unsuccessful as the finding could not be reproduced at the time of the biopsy. EXAM: DIGITAL DIAGNOSTIC BILATERAL MAMMOGRAM WITH TOMOSYNTHESIS AND CAD; ULTRASOUND LEFT BREAST LIMITED TECHNIQUE: Bilateral digital diagnostic mammography and breast tomosynthesis was performed. The images were evaluated with computer-aided detection. ; Targeted ultrasound examination of the left breast was performed. COMPARISON:  Previous exam(s). ACR Breast Density Category b: There are scattered areas of fibroglandular density. FINDINGS: Mammographically, there are no new suspicious masses, areas of nonsurgical architectural distortion or microcalcifications in the right breast. Stable post lumpectomy changes. The previously noted few mm nodule in the right breast upper outer quadrant, middle to posterior depth, has resolved. Mammographically, there is a new spiculated mass in the upper central left breast, middle to posterior depth, measuring 7-8 mm. Additionally, there is a suspected duct ectasia containing scattered calcifications in the left 12 o'clock breast, middle depth. Targeted left breast ultrasound is performed and demonstrates 12 o'clock 7 cm from the nipple irregular hypoechoic mass measuring 0.5 x 0.6 x 0.4 cm. This finding corresponds to the mammographically seen mass. No sonographic  correlation is found to the suspected area of duct ectasia more anteriorly in the left 12 o'clock breast. There is no evidence of left axillary lymphadenopathy. IMPRESSION: 1. Left breast 12 o'clock suspicious mass, for which ultrasound-guided core needle biopsy is recommended. 2. Left breast 12 o'clock suspected duct ectasia containing scattered calcifications. With malignant pathology results, and if breast conservation is considered, recommend further evaluation with breast MRI or stereotactic core needle biopsy. 3. No mammographic evidence of right breast malignancy, status post right lumpectomy. RECOMMENDATION: 1. Ultrasound-guided core needle biopsy of left breast 12 o'clock mass. 2. With malignant pathology results, and if breast conservation is considered, recommend further evaluation with breast MRI or stereotactic core needle biopsy, because of suspected calcifications containing left breast duct ectasia. I have discussed the findings and recommendations with the patient. If applicable, a reminder letter will be sent to the patient regarding the  next appointment. BI-RADS CATEGORY  4: Suspicious. Electronically Signed   By: Dobrinka  Dimitrova M.D.   On: 10/02/2023 11:20   US  LIMITED ULTRASOUND INCLUDING AXILLA LEFT BREAST  Result Date: 10/02/2023 CLINICAL DATA:  History of treated right breast cancer, status post breast conservation therapy in 2020. In January 2024, a if 4 mm mass was discovered in patient's right breast 10:30 o'clock. Subsequent biopsy was attempted but was unsuccessful as the finding could not be reproduced at the time of the biopsy. EXAM: DIGITAL DIAGNOSTIC BILATERAL MAMMOGRAM WITH TOMOSYNTHESIS AND CAD; ULTRASOUND LEFT BREAST LIMITED TECHNIQUE: Bilateral digital diagnostic mammography and breast tomosynthesis was performed. The images were evaluated with computer-aided detection. ; Targeted ultrasound examination of the left breast was performed. COMPARISON:  Previous exam(s). ACR  Breast Density Category b: There are scattered areas of fibroglandular density. FINDINGS: Mammographically, there are no new suspicious masses, areas of nonsurgical architectural distortion or microcalcifications in the right breast. Stable post lumpectomy changes. The previously noted few mm nodule in the right breast upper outer quadrant, middle to posterior depth, has resolved. Mammographically, there is a new spiculated mass in the upper central left breast, middle to posterior depth, measuring 7-8 mm. Additionally, there is a suspected duct ectasia containing scattered calcifications in the left 12 o'clock breast, middle depth. Targeted left breast ultrasound is performed and demonstrates 12 o'clock 7 cm from the nipple irregular hypoechoic mass measuring 0.5 x 0.6 x 0.4 cm. This finding corresponds to the mammographically seen mass. No sonographic correlation is found to the suspected area of duct ectasia more anteriorly in the left 12 o'clock breast. There is no evidence of left axillary lymphadenopathy. IMPRESSION: 1. Left breast 12 o'clock suspicious mass, for which ultrasound-guided core needle biopsy is recommended. 2. Left breast 12 o'clock suspected duct ectasia containing scattered calcifications. With malignant pathology results, and if breast conservation is considered, recommend further evaluation with breast MRI or stereotactic core needle biopsy. 3. No mammographic evidence of right breast malignancy, status post right lumpectomy. RECOMMENDATION: 1. Ultrasound-guided core needle biopsy of left breast 12 o'clock mass. 2. With malignant pathology results, and if breast conservation is considered, recommend further evaluation with breast MRI or stereotactic core needle biopsy, because of suspected calcifications containing left breast duct ectasia. I have discussed the findings and recommendations with the patient. If applicable, a reminder letter will be sent to the patient regarding the next  appointment. BI-RADS CATEGORY  4: Suspicious. Electronically Signed   By: Dobrinka  Dimitrova M.D.   On: 10/02/2023 11:20      IMPRESSION/PLAN: This is a wonderful patient with a previous history of ER positive stage I right breast cancer for which she underwent breast conserving surgery followed by antiestrogen therapy.  She did not tolerate the antiestrogen therapy for more than a matter of weeks and stopped that due to intolerance.  She has not had radiation in the past.  She now presents with a stage I ER positive left breast cancer.  For the patient's early stage favorable risk breast cancer, we had a thorough discussion about her options for adjuvant therapy.  Given that she is reluctant to try antiestrogen therapy again and has a history of intolerance to antiestrogen therapy, I think that radiation therapy will play an important role in her adjuvant treatment for her left breast cancer.   We discussed the risks benefits and side effects of radiotherapy. She understands that the side effects would likely include some skin irritation and fatigue during the weeks of radiation.  There is a risk of late effects which include but are not necessarily limited to cosmetic changes and rare lung  or heart toxicity.  We use special techniques and technology to minimize internal organ exposure.  I would anticipate delivering approximately 1-4 weeks of radiotherapy    After a thorough discussion of standard hypofractionation over 3-4 weeks, we discussed 1 week of ultra hypofractionated radiation therapy. I discussed that this approach is a bit less standard than longer regimens.  The Fast Forward trial (1 week) method has shown a slight increase in normal tissue side effects over the 3 week hypofractionation standard at 5 years of follow-up. This includes effects like induration, and edema.  However, for elderly patients that are keen on the convenience of minimizing the number of procedures/visits to the cancer  center, this is a nevertheless a reasonable approach to consider and the vast majority of patients do very well. There is also an option of RT once weekly for 5 weeks that we discussed.   She is enthusiastic about pursuing radiation therapy and is leaning towards ultra hypofractionation over 5 treatments (1 wk)   We spoke about other general acute effects of breast radiation including skin irritation and fatigue as well as breast fibrosis, induration, and /or swelling long term, and much less common late effects including internal organ injury or irritation. We spoke about the latest technology that is used to minimize the risk of late effects for patients undergoing radiotherapy to the breast or chest wall. No guarantees of treatment were given. The patient is enthusiastic about proceeding with treatment.  I look forward to participating in the patient's care.  I will await her referral back to me for postoperative follow-up and eventual CT simulation/treatment planning.      On date of service, in total, I spent 45 minutes on this encounter. Patient was seen in person.   __________________________________________   Lauraine Golden, MD  This document serves as a record of services personally performed by Lauraine Golden, MD. It was created on her behalf by Dorthy Fuse, a trained medical scribe. The creation of this record is based on the scribe's personal observations and the provider's statements to them. This document has been checked and approved by the attending provider.

## 2023-10-13 ENCOUNTER — Ambulatory Visit: Payer: Self-pay | Admitting: Surgery

## 2023-10-13 ENCOUNTER — Ambulatory Visit
Admission: RE | Admit: 2023-10-13 | Discharge: 2023-10-13 | Disposition: A | Discharge status: Home / Self Care | Payer: Medicare HMO | Source: Ambulatory Visit | Attending: Radiation Oncology | Admitting: Radiation Oncology

## 2023-10-13 ENCOUNTER — Encounter: Payer: Self-pay | Admitting: Radiation Oncology

## 2023-10-13 ENCOUNTER — Ambulatory Visit
Admission: RE | Admit: 2023-10-13 | Discharge: 2023-10-13 | Disposition: A | Discharge status: Home / Self Care | Payer: Medicare HMO | Source: Ambulatory Visit | Attending: Radiation Oncology

## 2023-10-13 ENCOUNTER — Inpatient Hospital Stay: Payer: Medicare HMO | Attending: Hematology and Oncology | Admitting: Hematology and Oncology

## 2023-10-13 VITALS — BP 139/80 | HR 65 | Temp 97.1°F | Resp 18 | Ht 62.0 in | Wt 207.4 lb

## 2023-10-13 DIAGNOSIS — Z170 Estrogen receptor positive status [ER+]: Secondary | ICD-10-CM | POA: Insufficient documentation

## 2023-10-13 DIAGNOSIS — C50411 Malignant neoplasm of upper-outer quadrant of right female breast: Secondary | ICD-10-CM | POA: Insufficient documentation

## 2023-10-13 DIAGNOSIS — K219 Gastro-esophageal reflux disease without esophagitis: Secondary | ICD-10-CM | POA: Diagnosis not present

## 2023-10-13 DIAGNOSIS — C50911 Malignant neoplasm of unspecified site of right female breast: Secondary | ICD-10-CM | POA: Insufficient documentation

## 2023-10-13 DIAGNOSIS — Z1721 Progesterone receptor positive status: Secondary | ICD-10-CM | POA: Diagnosis not present

## 2023-10-13 DIAGNOSIS — C50412 Malignant neoplasm of upper-outer quadrant of left female breast: Secondary | ICD-10-CM | POA: Insufficient documentation

## 2023-10-13 DIAGNOSIS — Z1732 Human epidermal growth factor receptor 2 negative status: Secondary | ICD-10-CM | POA: Insufficient documentation

## 2023-10-13 DIAGNOSIS — R609 Edema, unspecified: Secondary | ICD-10-CM | POA: Diagnosis not present

## 2023-10-13 DIAGNOSIS — Z87891 Personal history of nicotine dependence: Secondary | ICD-10-CM | POA: Insufficient documentation

## 2023-10-13 DIAGNOSIS — Z923 Personal history of irradiation: Secondary | ICD-10-CM | POA: Insufficient documentation

## 2023-10-13 DIAGNOSIS — I10 Essential (primary) hypertension: Secondary | ICD-10-CM | POA: Insufficient documentation

## 2023-10-13 DIAGNOSIS — Z79811 Long term (current) use of aromatase inhibitors: Secondary | ICD-10-CM | POA: Diagnosis not present

## 2023-10-13 DIAGNOSIS — Z79899 Other long term (current) drug therapy: Secondary | ICD-10-CM | POA: Insufficient documentation

## 2023-10-13 DIAGNOSIS — C50912 Malignant neoplasm of unspecified site of left female breast: Secondary | ICD-10-CM

## 2023-10-13 NOTE — H&P (Signed)
 Subjective    Chief Complaint: Breast Cancer       History of Present Illness: Kelly Dixon is a 78 y.o. female who is seen today as an office consultation at the request of Dr. Axel Filler for evaluation of Breast Cancer .     Kelly Dixon   This is a 78 year old female status post right breast radioactive seed localized lumpectomy and sentinel lymph node biopsy in 2020 for invasive ductal carcinoma.  She was unable to tolerate antiestrogens.  She declined radiation at that time.   The patient recently underwent a routine screening mammogram that detected the suspicious mass in the left breast.  She underwent further workup that revealed a 0.5 x 0.6 x 0.4 cm mass located at 12:00, 7 cm from the nipple.  Pathology revealed invasive lobular carcinoma ER/PR positive, HER2 negative, Ki-67 8%. She has already been seen by radiation oncology who is planning postoperative adjuvant radiation.   Review of Systems: A complete review of systems was obtained from the patient.  I have reviewed this information and discussed as appropriate with the patient.  See HPI as well for other ROS.   Review of Systems  Constitutional: Negative.   HENT: Negative.    Eyes: Negative.   Respiratory: Negative.    Cardiovascular: Negative.   Gastrointestinal: Negative.   Genitourinary: Negative.   Musculoskeletal: Negative.   Skin: Negative.   Neurological: Negative.   Endo/Heme/Allergies: Negative.   Psychiatric/Behavioral: Negative.          Medical History: Past Medical History      Past Medical History:  Diagnosis Date   Arthritis     History of cancer          Problem List     Patient Active Problem List  Diagnosis   Malignant neoplasm of upper-outer quadrant of left female breast (CMS/HHS-HCC)   Status post total left knee replacement   Unilateral primary osteoarthritis, left knee   Invasive lobular carcinoma of breast, stage 1, left (CMS/HHS-HCC)        Past Surgical History        Past Surgical History:  Procedure Laterality Date   CHOLECYSTECTOMY       MASTECTOMY PARTIAL / LUMPECTOMY            Allergies  No Known Allergies     Medications Ordered Prior to Encounter        Current Outpatient Medications on File Prior to Visit  Medication Sig Dispense Refill   amLODIPine (NORVASC) 5 MG tablet Take 5 mg by mouth once daily       cyanocobalamin (VITAMIN B12) 1000 MCG tablet Take 1,000 mcg by mouth once daily       ferrous sulfate 325 (65 FE) MG EC tablet Take 325 mg by mouth daily with breakfast       acetaminophen (TYLENOL) 500 MG tablet Take 1,000 mg by mouth       fexofenadine (ALLEGRA) 180 MG tablet Take 180 mg by mouth       psyllium husk/calcium carb (PSYLLIUM HUSK-CALCIUM ORAL) Take 2 tablets by mouth        No current facility-administered medications on file prior to visit.        Family History       Family History  Problem Relation Age of Onset   Coronary Artery Disease (Blocked arteries around heart) Father     Coronary Artery Disease (Blocked arteries around heart) Brother  Tobacco Use History  Social History        Tobacco Use  Smoking Status Former   Types: Cigarettes  Smokeless Tobacco Never        Social History  Social History         Socioeconomic History   Marital status: Single  Tobacco Use   Smoking status: Former      Types: Cigarettes   Smokeless tobacco: Never  Substance and Sexual Activity   Alcohol use: Never   Drug use: Never    Social Drivers of Acupuncturist Strain: Medium Risk (02/10/2023)    Received from [facility]    Overall Financial Resource Strain (CARDIA)     Difficulty of Paying Living Expenses: Somewhat hard  Food Insecurity: No Food Insecurity (02/10/2023)    Received from [facility]    Hunger Vital Sign     Worried About Running Out of Food in the Last Year: Never true     Ran Out of Food in the Last Year: Never true  Transportation Needs: No  Transportation Needs (02/10/2023)    Received from [facility] - Transportation     Lack of Transportation (Medical): No     Lack of Transportation (Non-Medical): No  Physical Activity: Insufficiently Active (02/10/2023)    Received from [facility]    Exercise Vital Sign     Days of Exercise per Week: 3 days     Minutes of Exercise per Session: 30 min  Stress: No Stress Concern Present (02/10/2023)    Received from [facility] - Occupational Stress Questionnaire     Feeling of Stress : Not at all  Social Connections: Unknown (02/10/2023)    Received from [facility]    Social Connection and Isolation Panel [NHANES]     Frequency of Communication with Friends and Family: More than three times a week     Frequency of Social Gatherings with Friends and Family: More than three times a week     Active Member of [employer] or Organizations: Yes     Attends Engineer, structural: More than 4 times per year     Marital Status: Divorced  Housing Stability: Unknown (10/13/2023)    Housing Stability Vital Sign     Homeless in the Last Year: No        Objective:         Vitals:    10/13/23 1128  BP: 120/74  Pulse: 101  Temp: 37.2 C (98.9 F)  SpO2: 97%  Weight: 95.2 kg (209 lb 12.8 oz)  Height: 157.5 cm (5\' 2" )  PainSc: 0-No pain    Body mass index is 38.37 kg/m.   Physical Exam    Constitutional:  WDWN in NAD, conversant, no obvious deformities; lying in bed comfortably Eyes:  Pupils equal, round; sclera anicteric; moist conjunctiva; no lid lag HENT:  Oral mucosa moist; good dentition  Neck:  No masses palpated, trachea midline; no thyromegaly Lungs:  CTA bilaterally; normal respiratory effort Breasts:  symmetric, no nipple changes; no palpable masses or lymphadenopathy on either side, healed right axillary and right circumareolar incisions. CV:  Regular rate and rhythm; no murmurs; extremities well-perfused with no  edema Abd:  +bowel sounds, soft, non-tender, no palpable organomegaly; no palpable hernias Musc: Normal gait; no apparent clubbing or cyanosis in extremities Lymphatic:  No palpable cervical or axillary lymphadenopathy Skin:  Warm, dry; no sign of jaundice Psychiatric - alert and oriented x 4; calm mood and affect     Labs, Imaging and Diagnostic Testing: CLINICAL DATA:  History of treated right breast cancer, status post breast conservation therapy in 2020.   In January 2024, a if 4 mm mass was discovered in patient's right breast 10:30 o'clock. Subsequent biopsy was attempted but was unsuccessful as the finding could not be reproduced at the time of the biopsy.   EXAM: DIGITAL DIAGNOSTIC BILATERAL MAMMOGRAM WITH TOMOSYNTHESIS AND CAD; ULTRASOUND LEFT BREAST LIMITED   TECHNIQUE: Bilateral digital diagnostic mammography and breast tomosynthesis was performed. The images were evaluated with computer-aided detection. ; Targeted ultrasound examination of the left breast was performed.   COMPARISON:  Previous exam(s).   ACR Breast Density Category b: There are scattered areas of fibroglandular density.   FINDINGS: Mammographically, there are no new suspicious masses, areas of nonsurgical architectural distortion or microcalcifications in the right breast. Stable post lumpectomy changes. The previously noted few mm nodule in the right breast upper outer quadrant, middle to posterior depth, has resolved.   Mammographically, there is a new spiculated mass in the upper central left breast, middle to posterior depth, measuring 7-8 mm. Additionally, there is a suspected duct ectasia containing scattered calcifications in the left 12 o'clock breast, middle depth.   Targeted left breast ultrasound is performed and demonstrates 12 o'clock 7 cm from the nipple irregular hypoechoic mass measuring 0.5 x 0.6 x 0.4 cm. This finding corresponds to the mammographically seen mass. No  sonographic correlation is found to the suspected area of duct ectasia more anteriorly in the left 12 o'clock breast. There is no evidence of left axillary lymphadenopathy.   IMPRESSION: 1. Left breast 12 o'clock suspicious mass, for which ultrasound-guided core needle biopsy is recommended. 2. Left breast 12 o'clock suspected duct ectasia containing scattered calcifications. With malignant pathology results, and if breast conservation is considered, recommend further evaluation with breast MRI or stereotactic core needle biopsy. 3. No mammographic evidence of right breast malignancy, status post right lumpectomy.   RECOMMENDATION: 1. Ultrasound-guided core needle biopsy of left breast 12 o'clock mass. 2. With malignant pathology results, and if breast conservation is considered, recommend further evaluation with breast MRI or stereotactic core needle biopsy, because of suspected calcifications containing left breast duct ectasia.   I have discussed the findings and recommendations with the patient. If applicable, a reminder letter will be sent to the patient regarding the next appointment.   BI-RADS CATEGORY  4: Suspicious.     Electronically Signed   By: Ted Mcalpine M.D.   On: 10/02/2023 11:20     Assessment and Plan:  Diagnoses and all orders for this visit:   Invasive lobular carcinoma of breast, stage 1, left (CMS/HHS-HCC)     Recommend left breast radioactive seed localized lumpectomy.  Due to recent data, there is no indication for sentinel lymph node biopsy at this time.The surgical procedure has been discussed with the patient.  Potential risks, benefits, alternative treatments, and expected outcomes have been explained.  All of the patient's questions at this time have been answered.  The likelihood of reaching the patient's treatment goal is good.  The patient understands the proposed surgical procedure and wishes to proceed.     Syla Devoss Delbert Harness, MD   10/13/2023 11:45 AM

## 2023-10-13 NOTE — Progress Notes (Signed)
 Patient Care Team: Frann Mabel Mt, DO as PCP - General (Family Medicine) Belinda Cough, MD as Consulting Physician (General Surgery) Odean Potts, MD as Consulting Physician (Hematology and Oncology) Izell Domino, MD as Attending Physician (Radiation Oncology) Tyree Nanetta SAILOR, RN as Oncology Nurse Navigator Glean Stephane BROCKS, RN as Oncology Nurse Navigator  DIAGNOSIS:  Encounter Diagnosis  Name Primary?   Malignant neoplasm of upper-outer quadrant of right breast in female, estrogen receptor positive (HCC) Yes    SUMMARY OF ONCOLOGIC HISTORY: Oncology History  Malignant neoplasm of upper-outer quadrant of right breast in female, estrogen receptor positive (HCC)  09/08/2019 Initial Diagnosis   Routine screening mammogram detected a 0.8cm mass at the 10 o'clock position in the right breast, no right axillary adenopathy. Biopsy showed IDC with DCIS, grade 2, HER-2 - (1+), ER+ 100%, PR 70%, Ki67 15%.    09/08/2019 Cancer Staging   Staging form: Breast, AJCC 8th Edition - Clinical: Stage IA (cT1b, cN0, cM0, G2, ER+, PR+, HER2-)    09/15/2019 Surgery   Right lumpectomy ([facility]) ([id number]): IDC with DCIS, 0.9cm, clear margins, 3 right axillary lymph nodes negative.    09/15/2019 Cancer Staging   Staging form: Breast, AJCC 8th Edition - Pathologic stage from 09/15/2019: Stage IA (pT1b, pN0, cM0, G2, ER+, PR+, HER2-)    10/2019 - 09/2024 Anti-estrogen oral therapy   Anastrozole  daily   Malignant neoplasm of upper-outer quadrant of left female breast (HCC)  10/07/2023 Initial Diagnosis   Malignant neoplasm of upper-outer quadrant of left female breast (HCC)   10/13/2023 Cancer Staging   Staging form: Breast, AJCC 8th Edition - Clinical stage from 10/13/2023: Stage IA (cT1b, cN0, cM0, G2, ER+, PR+, HER2-) - Signed by Izell Domino, MD on 10/13/2023 Stage prefix: Initial diagnosis Histologic grading system: 3 grade system     CHIEF COMPLIANT: Contralateral breast  cancer  HISTORY OF PRESENT ILLNESS:  History of Present Illness   The patient, with a history of right-sided breast cancer diagnosed four years ago, presents for discussion of recent mammogram findings and biopsy results. The previous cancer was early stage and managed with surgery and anastrozole , which was discontinued after two days due to intolerable side effects. The current findings were identified on routine mammogram and the biopsy indicates another early stage, estrogen-fed cancer. She declined radiation therapy for the previous cancer but is now considering it for the current cancer after discussion with her care team.  In addition to the breast cancer, the patient experienced a bowel obstruction last year, requiring hospitalization. She is otherwise in good health.         ALLERGIES:  has no known allergies.  MEDICATIONS:  Current Outpatient Medications  Medication Sig Dispense Refill   acetaminophen  (TYLENOL ) 500 MG tablet Take 1,000 mg by mouth in the morning and at bedtime.     amLODipine  (NORVASC ) 5 MG tablet TAKE 1 TABLET(5 MG) BY MOUTH DAILY 30 tablet 3   cyanocobalamin  1000 MCG tablet Take 1 tablet (1,000 mcg total) by mouth daily. 30 tablet 0   ferrous sulfate  325 (65 FE) MG EC tablet Take 1 tablet (325 mg total) by mouth daily with breakfast. 30 tablet 3   fexofenadine (ALLEGRA) 180 MG tablet Take 180 mg by mouth in the morning.     Psyllium-Calcium  (FIBER PLUS CALCIUM  PO) Take 2 tablets by mouth in the morning. Chewables     No current facility-administered medications for this visit.    PHYSICAL EXAMINATION: ECOG PERFORMANCE STATUS: 1 - Symptomatic but completely  ambulatory  There were no vitals filed for this visit. There were no vitals filed for this visit.  Physical Exam          (exam performed in the presence of a chaperone)  LABORATORY DATA:  I have reviewed the data as listed    Latest Ref Rng & Units 03/05/2023    2:00 PM 10/21/2022    2:39 PM  09/30/2022    8:17 AM  CMP  Glucose 70 - 99 mg/dL 856  876  894   BUN 8 - 23 mg/dL 17  16  7    Creatinine 0.44 - 1.00 mg/dL 9.16  9.30  9.36   Sodium 135 - 145 mmol/L 139  142  138   Potassium 3.5 - 5.1 mmol/L 3.7  4.3  3.9   Chloride 98 - 111 mmol/L 104  105  104   CO2 22 - 32 mmol/L 26  29  25    Calcium  8.9 - 10.3 mg/dL 8.8  9.2  8.9   Total Protein 6.5 - 8.1 g/dL 6.7  6.9  7.2   Total Bilirubin 0.3 - 1.2 mg/dL 0.4  0.4  0.9   Alkaline Phos 38 - 126 U/L 64  60  56   AST 15 - 41 U/L 17  11  13    ALT 0 - 44 U/L 16  6  11      Lab Results  Component Value Date   WBC 5.4 03/05/2023   HGB 12.5 03/05/2023   HCT 38.8 03/05/2023   MCV 95.6 03/05/2023   PLT 226 03/05/2023   NEUTROABS 3.6 09/30/2022    ASSESSMENT & PLAN:  Malignant neoplasm of upper-outer quadrant of right breast in female, estrogen receptor positive (HCC) 09/15/2019:Right lumpectomy ([facility]): IDC with DCIS, 0.9cm, clear margins, 3 right axillary lymph nodes negative.  ER 100%, PR 70%, Ki-67 15%, HER-2 negative, T1BN0 stage Ia pathologic stage  Patient declined adjuvant radiation Prior treatment: Anastrozole  1 mg daily started 10/05/2019  Left breast 10/02/2023: Left breast 12:00 suspicious mass 0.6 cm biopsy: Grade 2 invasive mammary cancer with mammary carcinoma in situ ER 100%, PR 25%, Ki67 8%, HER2 0 negative  Treatment plan: Breast conserving surgery + XRT (patient met with Dr. Izell and agreeable) Adjuvant antiestrogen therapy with letrozole  once daily x 5 years  Return to clinic after surgery to discuss pathology report ------------------------------------- Assessment and Plan    Breast Cancer (recurrence) Early stage (1A), estrogen receptor positive, detected on routine mammogram. History of poor tolerance to Anastrozole . Surgery pending, patient considering radiation therapy this time. -Clear patient for surgery. -Discuss alternative anti-estrogen therapies post-surgery and radiation.  We will consider  letrozole  -Follow-up post-radiation to discuss initiation of anti-estrogen therapy.  General Health Maintenance History of bowel obstruction last year, otherwise good health. -Continue routine health maintenance as per guidelines.          No orders of the defined types were placed in this encounter.  The patient has a good understanding of the overall plan. she agrees with it. she will call with any problems that may develop before the next visit here. Total time spent: 30 mins including face to face time and time spent for planning, charting and co-ordination of care   Naomi MARLA Chad, MD 10/13/23

## 2023-10-13 NOTE — Assessment & Plan Note (Signed)
 09/15/2019:Right lumpectomy ([facility]): IDC with DCIS, 0.9cm, clear margins, 3 right axillary lymph nodes negative.  ER 100%, PR 70%, Ki-67 15%, HER-2 negative, T1BN0 stage Ia pathologic stage  Patient declined adjuvant radiation Prior treatment: Anastrozole  1 mg daily started 10/05/2019  Left breast 10/02/2023: Left breast 12:00 suspicious mass 0.6 cm biopsy: Grade 2 invasive mammary cancer with mammary carcinoma in situ ER 100%, PR 25%, Ki67 8%, HER2 0 negative  Treatment plan: Breast conserving surgery +/- XRT Adjuvant antiestrogen therapy  Return to clinic after surgery to discuss pathology report

## 2023-10-13 NOTE — H&P (View-Only) (Signed)
Subjective    Chief Complaint: Breast Cancer       History of Present Illness: Kelly Dixon is a 78 y.o. female who is seen today as an office consultation at the request of Dr. Axel Filler for evaluation of Breast Cancer .     [name]/ [name]   This is a 78 year old female status post right breast radioactive seed localized lumpectomy and sentinel lymph node biopsy in 2020 for invasive ductal carcinoma.  She was unable to tolerate antiestrogens.  She declined radiation at that time.   The patient recently underwent a routine screening mammogram that detected the suspicious mass in the left breast.  She underwent further workup that revealed a 0.5 x 0.6 x 0.4 cm mass located at 12:00, 7 cm from the nipple.  Pathology revealed invasive lobular carcinoma ER/PR positive, HER2 negative, Ki-67 8%. She has already been seen by radiation oncology who is planning postoperative adjuvant radiation.   Review of Systems: A complete review of systems was obtained from the patient.  I have reviewed this information and discussed as appropriate with the patient.  See HPI as well for other ROS.   Review of Systems  Constitutional: Negative.   HENT: Negative.    Eyes: Negative.   Respiratory: Negative.    Cardiovascular: Negative.   Gastrointestinal: Negative.   Genitourinary: Negative.   Musculoskeletal: Negative.   Skin: Negative.   Neurological: Negative.   Endo/Heme/Allergies: Negative.   Psychiatric/Behavioral: Negative.          Medical History: Past Medical History      Past Medical History:  Diagnosis Date   Arthritis     History of cancer          Problem List     Patient Active Problem List  Diagnosis   Malignant neoplasm of upper-outer quadrant of left female breast (CMS/HHS-HCC)   Status post total left knee replacement   Unilateral primary osteoarthritis, left knee   Invasive lobular carcinoma of breast, stage 1, left (CMS/HHS-HCC)        Past Surgical History        Past Surgical History:  Procedure Laterality Date   CHOLECYSTECTOMY       MASTECTOMY PARTIAL / LUMPECTOMY            Allergies  No Known Allergies     Medications Ordered Prior to Encounter        Current Outpatient Medications on File Prior to Visit  Medication Sig Dispense Refill   amLODIPine (NORVASC) 5 MG tablet Take 5 mg by mouth once daily       cyanocobalamin (VITAMIN B12) 1000 MCG tablet Take 1,000 mcg by mouth once daily       ferrous sulfate 325 (65 FE) MG EC tablet Take 325 mg by mouth daily with breakfast       acetaminophen (TYLENOL) 500 MG tablet Take 1,000 mg by mouth       fexofenadine (ALLEGRA) 180 MG tablet Take 180 mg by mouth       psyllium husk/calcium carb (PSYLLIUM HUSK-CALCIUM ORAL) Take 2 tablets by mouth        No current facility-administered medications on file prior to visit.        Family History       Family History  Problem Relation Age of Onset   Coronary Artery Disease (Blocked arteries around heart) Father     Coronary Artery Disease (Blocked arteries around heart) Brother  Tobacco Use History  Social History        Tobacco Use  Smoking Status Former   Types: Cigarettes  Smokeless Tobacco Never        Social History  Social History         Socioeconomic History   Marital status: Single  Tobacco Use   Smoking status: Former      Types: Cigarettes   Smokeless tobacco: Never  Substance and Sexual Activity   Alcohol use: Never   Drug use: Never    Social Drivers of Acupuncturist Strain: Medium Risk (02/10/2023)    Received from [facility]    Overall Financial Resource Strain (CARDIA)     Difficulty of Paying Living Expenses: Somewhat hard  Food Insecurity: No Food Insecurity (02/10/2023)    Received from [facility]    Hunger Vital Sign     Worried About Running Out of Food in the Last Year: Never true     Ran Out of Food in the Last Year: Never true  Transportation Needs: No  Transportation Needs (02/10/2023)    Received from [facility] - Transportation     Lack of Transportation (Medical): No     Lack of Transportation (Non-Medical): No  Physical Activity: Insufficiently Active (02/10/2023)    Received from [facility]    Exercise Vital Sign     Days of Exercise per Week: 3 days     Minutes of Exercise per Session: 30 min  Stress: No Stress Concern Present (02/10/2023)    Received from [facility] - Occupational Stress Questionnaire     Feeling of Stress : Not at all  Social Connections: Unknown (02/10/2023)    Received from [facility]    Social Connection and Isolation Panel [NHANES]     Frequency of Communication with Friends and Family: More than three times a week     Frequency of Social Gatherings with Friends and Family: More than three times a week     Active Member of [employer] or Organizations: Yes     Attends Engineer, structural: More than 4 times per year     Marital Status: Divorced  Housing Stability: Unknown (10/13/2023)    Housing Stability Vital Sign     Homeless in the Last Year: No        Objective:         Vitals:    10/13/23 1128  BP: 120/74  Pulse: 101  Temp: 37.2 C (98.9 F)  SpO2: 97%  Weight: 95.2 kg (209 lb 12.8 oz)  Height: 157.5 cm (5\' 2" )  PainSc: 0-No pain    Body mass index is 38.37 kg/m.   Physical Exam    Constitutional:  WDWN in NAD, conversant, no obvious deformities; lying in bed comfortably Eyes:  Pupils equal, round; sclera anicteric; moist conjunctiva; no lid lag HENT:  Oral mucosa moist; good dentition  Neck:  No masses palpated, trachea midline; no thyromegaly Lungs:  CTA bilaterally; normal respiratory effort Breasts:  symmetric, no nipple changes; no palpable masses or lymphadenopathy on either side, healed right axillary and right circumareolar incisions. CV:  Regular rate and rhythm; no murmurs; extremities well-perfused with no  edema Abd:  +bowel sounds, soft, non-tender, no palpable organomegaly; no palpable hernias Musc: Normal gait; no apparent clubbing or cyanosis in extremities Lymphatic:  No palpable cervical or axillary lymphadenopathy Skin:  Warm, dry; no sign of jaundice Psychiatric - alert and oriented x 4; calm mood and affect     Labs, Imaging and Diagnostic Testing: CLINICAL DATA:  History of treated right breast cancer, status post breast conservation therapy in 2020.   In January 2024, a if 4 mm mass was discovered in patient's right breast 10:30 o'clock. Subsequent biopsy was attempted but was unsuccessful as the finding could not be reproduced at the time of the biopsy.   EXAM: DIGITAL DIAGNOSTIC BILATERAL MAMMOGRAM WITH TOMOSYNTHESIS AND CAD; ULTRASOUND LEFT BREAST LIMITED   TECHNIQUE: Bilateral digital diagnostic mammography and breast tomosynthesis was performed. The images were evaluated with computer-aided detection. ; Targeted ultrasound examination of the left breast was performed.   COMPARISON:  Previous exam(s).   ACR Breast Density Category b: There are scattered areas of fibroglandular density.   FINDINGS: Mammographically, there are no new suspicious masses, areas of nonsurgical architectural distortion or microcalcifications in the right breast. Stable post lumpectomy changes. The previously noted few mm nodule in the right breast upper outer quadrant, middle to posterior depth, has resolved.   Mammographically, there is a new spiculated mass in the upper central left breast, middle to posterior depth, measuring 7-8 mm. Additionally, there is a suspected duct ectasia containing scattered calcifications in the left 12 o'clock breast, middle depth.   Targeted left breast ultrasound is performed and demonstrates 12 o'clock 7 cm from the nipple irregular hypoechoic mass measuring 0.5 x 0.6 x 0.4 cm. This finding corresponds to the mammographically seen mass. No  sonographic correlation is found to the suspected area of duct ectasia more anteriorly in the left 12 o'clock breast. There is no evidence of left axillary lymphadenopathy.   IMPRESSION: 1. Left breast 12 o'clock suspicious mass, for which ultrasound-guided core needle biopsy is recommended. 2. Left breast 12 o'clock suspected duct ectasia containing scattered calcifications. With malignant pathology results, and if breast conservation is considered, recommend further evaluation with breast MRI or stereotactic core needle biopsy. 3. No mammographic evidence of right breast malignancy, status post right lumpectomy.   RECOMMENDATION: 1. Ultrasound-guided core needle biopsy of left breast 12 o'clock mass. 2. With malignant pathology results, and if breast conservation is considered, recommend further evaluation with breast MRI or stereotactic core needle biopsy, because of suspected calcifications containing left breast duct ectasia.   I have discussed the findings and recommendations with the patient. If applicable, a reminder letter will be sent to the patient regarding the next appointment.   BI-RADS CATEGORY  4: Suspicious.     Electronically Signed   By: Ted Mcalpine M.D.   On: 10/02/2023 11:20     Assessment and Plan:  Diagnoses and all orders for this visit:   Invasive lobular carcinoma of breast, stage 1, left (CMS/HHS-HCC)     Recommend left breast radioactive seed localized lumpectomy.  Due to recent data, there is no indication for sentinel lymph node biopsy at this time.The surgical procedure has been discussed with the patient.  Potential risks, benefits, alternative treatments, and expected outcomes have been explained.  All of the patient's questions at this time have been answered.  The likelihood of reaching the patient's treatment goal is good.  The patient understands the proposed surgical procedure and wishes to proceed.     Syla Devoss Delbert Harness, MD   10/13/2023 11:45 AM

## 2023-10-14 ENCOUNTER — Encounter: Payer: Self-pay | Admitting: *Deleted

## 2023-10-14 DIAGNOSIS — C50412 Malignant neoplasm of upper-outer quadrant of left female breast: Secondary | ICD-10-CM

## 2023-10-15 ENCOUNTER — Telehealth: Payer: Self-pay | Admitting: Genetic Counselor

## 2023-10-15 ENCOUNTER — Other Ambulatory Visit: Payer: Self-pay | Admitting: Surgery

## 2023-10-15 DIAGNOSIS — C50912 Malignant neoplasm of unspecified site of left female breast: Secondary | ICD-10-CM

## 2023-10-15 NOTE — Telephone Encounter (Signed)
 Scheduled appointments per scheduling message. Patient is aware of the made appointments and will be mailed an appointment reminder.

## 2023-10-17 ENCOUNTER — Encounter (HOSPITAL_COMMUNITY)
Admission: RE | Admit: 2023-10-17 | Discharge: 2023-10-17 | Disposition: A | Discharge status: Home / Self Care | Payer: Medicare HMO | Source: Ambulatory Visit | Attending: Surgery | Admitting: Surgery

## 2023-10-17 ENCOUNTER — Encounter (HOSPITAL_COMMUNITY): Payer: Self-pay

## 2023-10-17 ENCOUNTER — Other Ambulatory Visit: Payer: Self-pay

## 2023-10-17 ENCOUNTER — Ambulatory Visit
Admission: RE | Admit: 2023-10-17 | Discharge: 2023-10-17 | Disposition: A | Discharge status: Home / Self Care | Payer: Medicare HMO | Source: Ambulatory Visit | Attending: Surgery | Admitting: Surgery

## 2023-10-17 VITALS — BP 109/62 | HR 73 | Temp 98.1°F | Resp 18 | Ht 62.0 in | Wt 206.0 lb

## 2023-10-17 DIAGNOSIS — K219 Gastro-esophageal reflux disease without esophagitis: Secondary | ICD-10-CM | POA: Insufficient documentation

## 2023-10-17 DIAGNOSIS — Z01818 Encounter for other preprocedural examination: Secondary | ICD-10-CM | POA: Insufficient documentation

## 2023-10-17 DIAGNOSIS — Z170 Estrogen receptor positive status [ER+]: Secondary | ICD-10-CM

## 2023-10-17 DIAGNOSIS — I10 Essential (primary) hypertension: Secondary | ICD-10-CM | POA: Insufficient documentation

## 2023-10-17 DIAGNOSIS — Z96652 Presence of left artificial knee joint: Secondary | ICD-10-CM | POA: Diagnosis not present

## 2023-10-17 DIAGNOSIS — D0512 Intraductal carcinoma in situ of left breast: Secondary | ICD-10-CM | POA: Diagnosis not present

## 2023-10-17 DIAGNOSIS — Z87891 Personal history of nicotine dependence: Secondary | ICD-10-CM | POA: Insufficient documentation

## 2023-10-17 DIAGNOSIS — C50912 Malignant neoplasm of unspecified site of left female breast: Secondary | ICD-10-CM | POA: Diagnosis not present

## 2023-10-17 HISTORY — PX: BREAST BIOPSY: SHX20

## 2023-10-17 LAB — BASIC METABOLIC PANEL
Anion gap: 9 (ref 5–15)
BUN: 12 mg/dL (ref 8–23)
CO2: 26 mmol/L (ref 22–32)
Calcium: 9.3 mg/dL (ref 8.9–10.3)
Chloride: 107 mmol/L (ref 98–111)
Creatinine, Ser: 1 mg/dL (ref 0.44–1.00)
GFR, Estimated: 59 mL/min — ABNORMAL LOW (ref >60)
Glucose, Bld: 109 mg/dL — ABNORMAL HIGH (ref 70–99)
Potassium: 4.6 mmol/L (ref 3.5–5.1)
Sodium: 142 mmol/L (ref 135–145)

## 2023-10-17 LAB — CBC
HCT: 40.1 % (ref 36.0–46.0)
Hemoglobin: 13 g/dL (ref 12.0–15.0)
MCH: 30.8 pg (ref 26.0–34.0)
MCHC: 32.4 g/dL (ref 30.0–36.0)
MCV: 95 fL (ref 80.0–100.0)
Platelets: 233 10*3/uL (ref 150–400)
RBC: 4.22 MIL/uL (ref 3.87–5.11)
RDW: 12 % (ref 11.5–15.5)
WBC: 4.5 10*3/uL (ref 4.0–10.5)
nRBC: 0 % (ref 0.0–0.2)

## 2023-10-17 NOTE — Pre-Procedure Instructions (Signed)
Surgical Instructions   Your procedure is scheduled on October 20, 2023. Report to [facility] Main Entrance "A" at 7:30 A.M., then check in with the Admitting office. Any questions or running late day of surgery: call [phone]  Questions prior to your surgery date: call [phone], Monday-Friday, 8am-4pm. If you experience any cold or flu symptoms such as cough, fever, chills, shortness of breath, etc. between now and your scheduled surgery, please notify us at the above number.     Remember:  Do not eat after midnight the night before your surgery   You may drink clear liquids until 6:30 AM the morning of your surgery.   Clear liquids allowed are: Water, Non-Citrus Juices (without pulp), Carbonated Beverages, Clear Tea (no milk, honey, etc.), Black Coffee Only (NO MILK, CREAM OR POWDERED CREAMER of any kind), and Gatorade.    Take these medicines the morning of surgery with A SIP OF WATER: acetaminophen (TYLENOL)  amLODipine (NORVASC)  fexofenadine (ALLEGRA)    One week prior to surgery, STOP taking any Aspirin (unless otherwise instructed by your surgeon) Aleve, Naproxen, Ibuprofen, Motrin, Advil, Goody's, BC's, all herbal medications, fish oil, and non-prescription vitamins.                     Do NOT Smoke (Tobacco/Vaping) for 24 hours prior to your procedure.  If you use a CPAP at night, you may bring your mask/headgear for your overnight stay.   You will be asked to remove any contacts, glasses, piercing's, hearing aid's, dentures/partials prior to surgery. Please bring cases for these items if needed.    Patients discharged the day of surgery will not be allowed to drive home, and someone needs to stay with them for 24 hours.  SURGICAL WAITING ROOM VISITATION Patients may have no more than 2 support people in the waiting area - these visitors may rotate.   Pre-op nurse will coordinate an appropriate time for 1 ADULT support person, who may not rotate, to accompany  patient in pre-op.  Children under the age of 25 must have an adult with them who is not the patient and must remain in the main waiting area with an adult.  If the patient needs to stay at the hospital during part of their recovery, the visitor guidelines for inpatient rooms apply.  Please refer to the [facility] website for the visitor guidelines for any additional information.   If you received a COVID test during your pre-op visit  it is requested that you wear a mask when out in public, stay away from anyone that may not be feeling well and notify your surgeon if you develop symptoms. If you have been in contact with anyone that has tested positive in the last 10 days please notify you surgeon.      Pre-operative CHG Bathing Instructions   You can play a key role in reducing the risk of infection after surgery. Your skin needs to be as free of germs as possible. You can reduce the number of germs on your skin by washing with CHG (chlorhexidine gluconate) soap before surgery. CHG is an antiseptic soap that kills germs and continues to kill germs even after washing.   DO NOT use if you have an allergy to chlorhexidine/CHG or antibacterial soaps. If your skin becomes reddened or irritated, stop using the CHG and notify one of our RNs at [phone].              TAKE A SHOWER THE  NIGHT BEFORE SURGERY AND THE DAY OF SURGERY    Please keep in mind the following:  DO NOT shave, including legs and underarms, 48 hours prior to surgery.   You may shave your face before/day of surgery.  Place clean sheets on your bed the night before surgery Use a clean washcloth (not used since being washed) for each shower. DO NOT sleep with pet's night before surgery.  CHG Shower Instructions:  Wash your face and private area with normal soap. If you choose to wash your hair, wash first with your normal shampoo.  After you use shampoo/soap, rinse your hair and body thoroughly to remove shampoo/soap  residue.  Turn the water OFF and apply half the bottle of CHG soap to a CLEAN washcloth.  Apply CHG soap ONLY FROM YOUR NECK DOWN TO YOUR TOES (washing for 3-5 minutes)  DO NOT use CHG soap on face, private areas, open wounds, or sores.  Pay special attention to the area where your surgery is being performed.  If you are having back surgery, having someone wash your back for you may be helpful. Wait 2 minutes after CHG soap is applied, then you may rinse off the CHG soap.  Pat dry with a clean towel  Put on clean pajamas    Additional instructions for the day of surgery: DO NOT APPLY any lotions, deodorants, cologne, or perfumes.   Do not wear jewelry or makeup Do not wear nail polish, gel polish, artificial nails, or any other type of covering on natural nails (fingers and toes) Do not bring valuables to the hospital. [facility] is not responsible for valuables/personal belongings. Put on clean/comfortable clothes.  Please brush your teeth.  Ask your nurse before applying any prescription medications to the skin.

## 2023-10-17 NOTE — Anesthesia Preprocedure Evaluation (Signed)
Anesthesia Evaluation  Patient identified by MRN, date of birth, ID band Patient awake    Reviewed: Allergy & Precautions, NPO status , Patient's Chart, lab work & pertinent test results, reviewed documented beta blocker date and time   Airway Mallampati: III  TM Distance: >3 FB Neck ROM: Full    Dental  (+) Dental Advisory Given, Edentulous Upper, Missing, Poor Dentition   Pulmonary asthma , sleep apnea (noncompliant w/ cpap) , COPD,  COPD inhaler, former smoker Quit smoking 2005, 33 pack year history    Pulmonary exam normal breath sounds clear to auscultation       Cardiovascular hypertension (102/71 preop), Pt. on medications and Pt. on home beta blockers + CAD and +CHF (LVEF 35-40%)  Normal cardiovascular exam+ Cardiac Defibrillator  Rhythm:Regular Rate:Normal   CAD (unchanged 3V CAD w/ CTO RCA, borderline CX, medical therapy 11/2021), chronic combined systolic and diastolic CHF, mixed ischemic/NICM, ICD (St. Jude/Abbott single chamber ICD 07/24/16; discharges for VT 2020, 2023, 2024), LBBB, dilated aortic root (44 mm 03/2023 TTE)  TTE 04/07/2023:  1. Left ventricular ejection fraction, by estimation, is 35 to 40%. Left  ventricular ejection fraction by 3D volume is 42 %. The left ventricle has  moderately decreased function. The left ventricle demonstrates regional  wall motion abnormalities (see  scoring diagram/findings for description). The left ventricular internal  cavity size was mildly dilated. There is moderate concentric left  ventricular hypertrophy. Indeterminate diastolic filling due to E-A  fusion. There is akinesis of the left  ventricular, basal-mid inferior wall and inferolateral wall. There is  moderate hypokinesis of the left ventricular, basal-mid inferoseptal wall.  The average left ventricular global longitudinal strain is -14.7 %. The  global longitudinal strain is  abnormal.   2. Right ventricular  systolic function is normal. The right ventricular  size is normal. Tricuspid regurgitation signal is inadequate for assessing  PA pressure.   3. Left atrial size was mildly dilated.   4. The mitral valve is normal in structure. No evidence of mitral valve  regurgitation.   5. The aortic valve is tricuspid. There is mild thickening of the aortic  valve. Aortic valve regurgitation is mild. Aortic valve sclerosis is  present, with no evidence of aortic valve stenosis.   6. Aortic dilatation noted. There is mild dilatation of the aortic root,  measuring 44 mm. There is mild dilatation of the ascending aorta,  measuring 40 mm.   7. The inferior vena cava is normal in size with greater than 50%  respiratory variability, suggesting right atrial pressure of 3 mmHg.  - Comparison(s): No significant change from prior study. Prior images  reviewed side by side. Echocardiogram done 10/15/22 showed an EF of 30-35%.   Long term monitor 11/23/2021 - 11/24/2021: 1. NSR with sinus brady and sinus tachycardia 2. Multifocal PVC's and PACs 3. One run of sustained VT around 160/min 4. Multiple runs of NSVT 5. AVWB was present 6. No prolonged pauses 7. No atrial fib 8. Noise artifact is present     Right/left cath 11/29/2021:   Mid LAD-1 lesion is 10% stenosed.   Mid LAD-2 lesion is 5% stenosed.   Mid RCA lesion is 100% stenosed.   2nd Mrg lesion is 60% stenosed.   Prox Cx lesion is 70% stenosed.   Mid Cx to Dist Cx lesion is 40% stenosed.     Neuro/Psych  PSYCHIATRIC DISORDERS Anxiety Depression    negative neurological ROS     GI/Hepatic Neg liver ROS, PUD,GERD  Controlled and Medicated,,  Endo/Other  Hypothyroidism    Renal/GU Renal Insufficiency and CRFRenal diseaseCr 1.76  negative genitourinary   Musculoskeletal negative musculoskeletal ROS (+)    Abdominal  (+) + obese  Peds  Hematology negative hematology ROS (+)   Anesthesia Other Findings    Reproductive/Obstetrics negative OB ROS                             Anesthesia Physical Anesthesia Plan  ASA: 4  Anesthesia Plan: General   Post-op Pain Management:    Induction: Intravenous  PONV Risk Score and Plan:   Airway Management Planned: Oral ETT  Additional Equipment: None  Intra-op Plan:   Post-operative Plan: Extubation in OR  Informed Consent: I have reviewed the patients History and Physical, chart, labs and discussed the procedure including the risks, benefits and alternatives for the proposed anesthesia with the patient or authorized representative who has indicated his/her understanding and acceptance.     Dental advisory given  Plan Discussed with: CRNA  Anesthesia Plan Comments: (Procedure will likely interfere with ICD function.  Device should be programmed:  Tachy therapies disabled )       Anesthesia Quick Evaluation

## 2023-10-17 NOTE — Progress Notes (Signed)
PCP - Dr. Arva Chafe Cardiologist - denies  PPM/ICD - denies   Chest x-ray - 11/16/22 EKG - 10/17/23 Stress Test - denies ECHO - denies Cardiac Cath - denies  Sleep Study - denies   DM- denies  Last dose of GLP1 agonist-  n/a   ASA/Blood Thinner Instructions: n/a   ERAS Protcol - yes, no drink   COVID TEST- n/a   Anesthesia review: yes, breast seed  Patient denies shortness of breath, fever, cough and chest pain at PAT appointment   All instructions explained to the patient, with a verbal understanding of the material. Patient agrees to go over the instructions while at home for a better understanding. The opportunity to ask questions was provided.

## 2023-10-17 NOTE — Progress Notes (Signed)
Anesthesia Chart Review:  Case: [id number] Date/Time: 10/20/23 0915   Procedure: LEFT BREAST LUMPECTOMY WITH RADIOACTIVE SEED LOCALIZATION (Left)   Anesthesia type: General   Pre-op diagnosis: LEFT BREAST INVASIVE LOBULAR CARCINOMA   [facility] ROOM 02 / [facility] OR   Surgeons: [name], MD       DISCUSSION: Patient is a 78 year old female scheduled for the above procedure.  History includes former smoker (quit 11/29/19), post-operative N/V, GERD, HTN, anemia, breast cancer (right breast lumpectomy 09/15/19), osteoarthritis (left TKA 03/26/22), cholecystectomy (04/13/19).   RSL on 10/17/23 at 2:30 PM.  Anesthesia team to evaluate on the day of surgery.   VS: BP 109/62   Pulse 73   Temp 36.7 C   Resp 18   Ht 5\' 2"  (1.575 m)   Wt 93.4 kg   SpO2 98%   BMI 37.68 kg/m    PROVIDERS: Sharlene Dory, DO is PCP  Serena Croissant, MD is HEM-ONC Lonie Peak, MD is RAD-ONC   LABS: Labs reviewed: Acceptable for surgery. (all labs ordered are listed, but only abnormal results are displayed)  Labs Reviewed  BASIC METABOLIC PANEL - Abnormal; Notable for the following components:      Result Value   Glucose, Bld 109 (*)    GFR, Estimated 59 (*)    All other components within normal limits  CBC     IMAGES: CT Chest 03/10/23: IMPRESSION: - Lung-RADS 1, negative. Continue annual screening with low-dose chest CT without contrast in 12 months. - Aortic Atherosclerosis (ICD10-I70.0) and Emphysema (ICD10-J43.9).   EKG: 10/17/23: Normal sinus rhythm Low voltage QRS Borderline ECG   CV: N/A  Past Medical History:  Diagnosis Date   Allergy 1996   Anemia    Arthritis    Breast cancer (HCC) 2020   right breast   Cataract May 2018   Gallstones 05/31/2018   GERD (gastroesophageal reflux disease)    pt states she no longer has issues with this   Hypertension    Personal history of radiation therapy    PONV (postoperative nausea and vomiting)    Syphilis     Past  Surgical History:  Procedure Laterality Date   BREAST BIOPSY Right 08/2019   BREAST BIOPSY Left 10/02/2023   [place] LT BREAST BX W LOC DEV 1ST LESION IMG BX SPEC US GUIDE 10/02/2023 [facility] MAMMOGRAPHY   BREAST LUMPECTOMY Right 08/2019   BREAST LUMPECTOMY WITH RADIOACTIVE SEED AND SENTINEL LYMPH NODE BIOPSY Right 09/15/2019   Procedure: RIGHT BREAST LUMPECTOMY WITH RADIOACTIVE SEED AND SENTINEL LYMPH NODE BIOPSY;  Surgeon: [name], MD;  Location: [facility];  Service: General;  Laterality: Right;   CHOLECYSTECTOMY N/A 04/13/2019   Procedure: LAPAROSCOPIC CHOLECYSTECTOMY WITH INTRAOPERATIVE CHOLANGIOGRAM;  Surgeon: [name], MD;  Location: [facility] OR;  Service: General;  Laterality: N/A;   DILATION AND CURETTAGE OF UTERUS     JOINT REPLACEMENT  June 2023   TOTAL KNEE ARTHROPLASTY Left 03/26/2022   Procedure: LEFT TOTAL KNEE ARTHROPLASTY;  Surgeon: Kathryne Hitch, MD;  Location: [facility] OR;  Service: Orthopedics;  Laterality: Left;   TUBAL LIGATION  1974    MEDICATIONS:  acetaminophen (TYLENOL) 500 MG tablet   amLODipine (NORVASC) 5 MG tablet   cyanocobalamin 1000 MCG tablet   ferrous sulfate 325 (65 FE) MG EC tablet   fexofenadine (ALLEGRA) 180 MG tablet   Psyllium-Calcium (FIBER PLUS CALCIUM PO)   No current facility-administered medications for this encounter.    Shonna Chock, PA-C Surgical Short Stay/Anesthesiology [facility]  Phone [phone] [facility] Phone [phone] 10/17/2023 2:25 PM

## 2023-10-20 ENCOUNTER — Ambulatory Visit
Admission: RE | Admit: 2023-10-20 | Discharge: 2023-10-20 | Disposition: A | Discharge status: Home / Self Care | Payer: Medicare HMO | Source: Ambulatory Visit | Attending: Surgery | Admitting: Surgery

## 2023-10-20 ENCOUNTER — Ambulatory Visit (HOSPITAL_BASED_OUTPATIENT_CLINIC_OR_DEPARTMENT_OTHER): Payer: Medicare HMO | Admitting: Anesthesiology

## 2023-10-20 ENCOUNTER — Encounter (HOSPITAL_COMMUNITY): Payer: Self-pay | Admitting: Surgery

## 2023-10-20 ENCOUNTER — Ambulatory Visit (HOSPITAL_COMMUNITY): Payer: Self-pay | Admitting: Vascular Surgery

## 2023-10-20 ENCOUNTER — Ambulatory Visit (HOSPITAL_COMMUNITY)
Admission: RE | Admit: 2023-10-20 | Discharge: 2023-10-20 | Disposition: A | Discharge status: Home / Self Care | Payer: Medicare HMO | Attending: Surgery | Admitting: Surgery

## 2023-10-20 ENCOUNTER — Other Ambulatory Visit: Payer: Self-pay

## 2023-10-20 ENCOUNTER — Encounter (HOSPITAL_COMMUNITY)
Admission: RE | Disposition: A | Discharge status: Home / Self Care | Payer: Self-pay | Source: Home / Self Care | Attending: Surgery

## 2023-10-20 DIAGNOSIS — C50812 Malignant neoplasm of overlapping sites of left female breast: Secondary | ICD-10-CM | POA: Diagnosis not present

## 2023-10-20 DIAGNOSIS — Z1721 Progesterone receptor positive status: Secondary | ICD-10-CM | POA: Diagnosis not present

## 2023-10-20 DIAGNOSIS — Z170 Estrogen receptor positive status [ER+]: Secondary | ICD-10-CM | POA: Diagnosis not present

## 2023-10-20 DIAGNOSIS — K219 Gastro-esophageal reflux disease without esophagitis: Secondary | ICD-10-CM | POA: Diagnosis not present

## 2023-10-20 DIAGNOSIS — I10 Essential (primary) hypertension: Secondary | ICD-10-CM | POA: Insufficient documentation

## 2023-10-20 DIAGNOSIS — D0502 Lobular carcinoma in situ of left breast: Secondary | ICD-10-CM | POA: Diagnosis not present

## 2023-10-20 DIAGNOSIS — C50912 Malignant neoplasm of unspecified site of left female breast: Secondary | ICD-10-CM | POA: Diagnosis not present

## 2023-10-20 DIAGNOSIS — Z87891 Personal history of nicotine dependence: Secondary | ICD-10-CM | POA: Insufficient documentation

## 2023-10-20 DIAGNOSIS — D0512 Intraductal carcinoma in situ of left breast: Secondary | ICD-10-CM

## 2023-10-20 HISTORY — PX: BREAST LUMPECTOMY WITH RADIOACTIVE SEED LOCALIZATION: SHX6424

## 2023-10-20 SURGERY — BREAST LUMPECTOMY WITH RADIOACTIVE SEED LOCALIZATION
Anesthesia: General | Laterality: Left

## 2023-10-20 MED ORDER — DEXAMETHASONE SODIUM PHOSPHATE 10 MG/ML IJ SOLN
INTRAMUSCULAR | Status: DC | PRN
  Administered 2023-10-20: 10 mg via INTRAVENOUS

## 2023-10-20 MED ORDER — 0.9 % SODIUM CHLORIDE (POUR BTL) OPTIME
TOPICAL | Status: DC | PRN
  Administered 2023-10-20: 1000 mL

## 2023-10-20 MED ORDER — BUPIVACAINE-EPINEPHRINE (PF) 0.25% -1:200000 IJ SOLN
INTRAMUSCULAR | Status: AC
  Filled 2023-10-20: qty 30

## 2023-10-20 MED ORDER — CHLORHEXIDINE GLUCONATE CLOTH 2 % EX PADS: 6.0000 | MEDICATED_PAD | Freq: Once | CUTANEOUS | Status: DC

## 2023-10-20 MED ORDER — ACETAMINOPHEN 500 MG PO TABS: 1000.0000 mg | ORAL_TABLET | Freq: Once | ORAL | Status: DC | PRN

## 2023-10-20 MED ORDER — ACETAMINOPHEN 500 MG PO TABS
1000.0000 mg | ORAL_TABLET | ORAL | Status: AC
  Administered 2023-10-20: 1000 mg via ORAL
  Filled 2023-10-20: qty 2

## 2023-10-20 MED ORDER — FENTANYL CITRATE (PF) 100 MCG/2ML IJ SOLN: 25.0000 ug | INTRAMUSCULAR | Status: DC | PRN

## 2023-10-20 MED ORDER — PROPOFOL 10 MG/ML IV BOLUS
INTRAVENOUS | Status: AC
  Filled 2023-10-20: qty 20

## 2023-10-20 MED ORDER — OXYCODONE HCL 5 MG PO TABS: 5.0000 mg | ORAL_TABLET | Freq: Once | ORAL | Status: DC | PRN

## 2023-10-20 MED ORDER — CHLORHEXIDINE GLUCONATE 0.12 % MT SOLN
15.0000 mL | Freq: Once | OROMUCOSAL | Status: AC
  Administered 2023-10-20: 15 mL via OROMUCOSAL
  Filled 2023-10-20: qty 15

## 2023-10-20 MED ORDER — ACETAMINOPHEN 160 MG/5ML PO SOLN: 1000.0000 mg | Freq: Once | ORAL | Status: DC | PRN

## 2023-10-20 MED ORDER — LACTATED RINGERS IV SOLN: INTRAVENOUS | Status: DC

## 2023-10-20 MED ORDER — OXYCODONE HCL 5 MG/5ML PO SOLN: 5.0000 mg | Freq: Once | ORAL | Status: DC | PRN

## 2023-10-20 MED ORDER — PROPOFOL 10 MG/ML IV BOLUS
INTRAVENOUS | Status: DC | PRN
  Administered 2023-10-20: 160 mg via INTRAVENOUS
  Administered 2023-10-20: 150 ug/kg/min via INTRAVENOUS

## 2023-10-20 MED ORDER — ACETAMINOPHEN 10 MG/ML IV SOLN: 1000.0000 mg | Freq: Once | INTRAVENOUS | Status: DC | PRN

## 2023-10-20 MED ORDER — PHENYLEPHRINE HCL (PRESSORS) 10 MG/ML IV SOLN
INTRAVENOUS | Status: DC | PRN
  Administered 2023-10-20: 160 ug via INTRAVENOUS
  Administered 2023-10-20: 80 ug via INTRAVENOUS
  Administered 2023-10-20: 160 ug via INTRAVENOUS

## 2023-10-20 MED ORDER — LIDOCAINE 2% (20 MG/ML) 5 ML SYRINGE
INTRAMUSCULAR | Status: DC | PRN
  Administered 2023-10-20: 60 mg via INTRAVENOUS

## 2023-10-20 MED ORDER — SODIUM CHLORIDE 0.9 % IV SOLN: INTRAVENOUS | Status: DC | PRN

## 2023-10-20 MED ORDER — EPHEDRINE SULFATE-NACL 50-0.9 MG/10ML-% IV SOSY
PREFILLED_SYRINGE | INTRAVENOUS | Status: DC | PRN
  Administered 2023-10-20 (×3): 5 mg via INTRAVENOUS

## 2023-10-20 MED ORDER — ONDANSETRON HCL 4 MG/2ML IJ SOLN
INTRAMUSCULAR | Status: DC | PRN
  Administered 2023-10-20: 4 mg via INTRAVENOUS

## 2023-10-20 MED ORDER — FENTANYL CITRATE (PF) 250 MCG/5ML IJ SOLN
INTRAMUSCULAR | Status: AC
  Filled 2023-10-20: qty 5

## 2023-10-20 MED ORDER — FENTANYL CITRATE (PF) 250 MCG/5ML IJ SOLN
INTRAMUSCULAR | Status: DC | PRN
  Administered 2023-10-20: 50 ug via INTRAVENOUS

## 2023-10-20 MED ORDER — ORAL CARE MOUTH RINSE: 15.0000 mL | Freq: Once | OROMUCOSAL | Status: AC

## 2023-10-20 MED ORDER — BUPIVACAINE-EPINEPHRINE 0.25% -1:200000 IJ SOLN
INTRAMUSCULAR | Status: DC | PRN
  Administered 2023-10-20: 10 mL

## 2023-10-20 MED ORDER — CEFAZOLIN SODIUM-DEXTROSE 2-4 GM/100ML-% IV SOLN
2.0000 g | INTRAVENOUS | Status: DC
  Filled 2023-10-20: qty 100

## 2023-10-20 SURGICAL SUPPLY — 35 items
APPLIER CLIP 9.375 MED OPEN (MISCELLANEOUS)
BAG COUNTER SPONGE SURGICOUNT (BAG) ×1 IMPLANT
BENZOIN TINCTURE PRP APPL 2/3 (GAUZE/BANDAGES/DRESSINGS) ×1 IMPLANT
CANISTER SUCT 3000ML PPV (MISCELLANEOUS) ×1 IMPLANT
CHLORAPREP W/TINT 26 (MISCELLANEOUS) ×1 IMPLANT
CLIP APPLIE 9.375 MED OPEN (MISCELLANEOUS) IMPLANT
COVER PROBE W GEL 5X96 (DRAPES) ×1 IMPLANT
COVER SURGICAL LIGHT HANDLE (MISCELLANEOUS) ×1 IMPLANT
DEVICE DUBIN SPECIMEN MAMMOGRA (MISCELLANEOUS) ×1 IMPLANT
DRAPE CHEST BREAST 15X10 FENES (DRAPES) ×1 IMPLANT
DRSG TEGADERM 4X10 (GAUZE/BANDAGES/DRESSINGS) IMPLANT
DRSG TEGADERM 4X4.75 (GAUZE/BANDAGES/DRESSINGS) ×1 IMPLANT
ELECT CAUTERY BLADE 6.4 (BLADE) ×1 IMPLANT
ELECT REM PT RETURN 9FT ADLT (ELECTROSURGICAL) ×1
ELECTRODE REM PT RTRN 9FT ADLT (ELECTROSURGICAL) ×1 IMPLANT
GAUZE SPONGE 2X2 8PLY STRL LF (GAUZE/BANDAGES/DRESSINGS) ×1 IMPLANT
GLOVE BIO SURGEON STRL SZ7 (GLOVE) ×1 IMPLANT
GLOVE BIOGEL PI IND STRL 7.5 (GLOVE) ×1 IMPLANT
GOWN STRL REUS W/ TWL LRG LVL3 (GOWN DISPOSABLE) ×2 IMPLANT
ILLUMINATOR WAVEGUIDE N/F (MISCELLANEOUS) IMPLANT
KIT BASIN OR (CUSTOM PROCEDURE TRAY) ×1 IMPLANT
KIT MARKER MARGIN INK (KITS) ×1 IMPLANT
LIGHT WAVEGUIDE WIDE FLAT (MISCELLANEOUS) IMPLANT
NDL HYPO 25GX1X1/2 BEV (NEEDLE) ×1 IMPLANT
NEEDLE HYPO 25GX1X1/2 BEV (NEEDLE) ×1 IMPLANT
NS IRRIG 1000ML POUR BTL (IV SOLUTION) ×1 IMPLANT
PACK GENERAL/GYN (CUSTOM PROCEDURE TRAY) ×1 IMPLANT
SPONGE T-LAP 4X18 ~~LOC~~+RFID (SPONGE) ×1 IMPLANT
STRIP CLOSURE SKIN 1/2X4 (GAUZE/BANDAGES/DRESSINGS) ×1 IMPLANT
SUT MNCRL AB 4-0 PS2 18 (SUTURE) ×1 IMPLANT
SUT SILK 2 0 SH (SUTURE) IMPLANT
SUT VIC AB 3-0 SH 27X BRD (SUTURE) ×1 IMPLANT
SYR CONTROL 10ML LL (SYRINGE) ×1 IMPLANT
TOWEL GREEN STERILE (TOWEL DISPOSABLE) ×1 IMPLANT
TOWEL GREEN STERILE FF (TOWEL DISPOSABLE) ×1 IMPLANT

## 2023-10-20 NOTE — Op Note (Signed)
Pre-op Diagnosis:  Invasive lobular carcinoma left breast Post-op Diagnosis: same Procedure:  Left breast radioactive seed localized lumpectomy Surgeon:  Anani Gu K. Anesthesia:  GEN - LMA Indications:  This is a 78 year old female status post right breast radioactive seed localized lumpectomy and sentinel lymph node biopsy in 2020 for invasive ductal carcinoma.  She was unable to tolerate antiestrogens.  She declined radiation at that time.   The patient recently underwent a routine screening mammogram that detected the suspicious mass in the left breast.  She underwent further workup that revealed a 0.5 x 0.6 x 0.4 cm mass located at 12:00, 7 cm from the nipple.  Pathology revealed invasive lobular carcinoma ER/PR positive, HER2 negative, Ki-67 8%. She has already been seen by radiation oncology who is planning postoperative adjuvant radiation.  Description of procedure: The patient is brought to the operating room placed in supine position on the operating room table. After an adequate level of general anesthesia was obtained, her left breast was prepped with ChloraPrep and draped in sterile fashion. A timeout was taken to ensure the proper patient and proper procedure. We interrogated the breast with the neoprobe. We made a transverse incision in the upper breast after infiltrating with 0.25% Marcaine. Dissection was carried down in the breast tissue with cautery. We used the neoprobe to guide [place] towards the radioactive seed. We excised an area of tissue around the radioactive seed 2 cm in diameter. The specimen was removed and was oriented with a paint kit. Specimen mammogram showed the radioactive seed as well as the biopsy clip within the specimen. This was sent for pathologic examination. There is no residual radioactivity within the biopsy cavity. We inspected carefully for hemostasis. The wound was thoroughly irrigated. Clips were placed in the margins of the lumpectomy cavity for  orientation.  The wound was closed with a deep layer of 3-0 Vicryl and a subcuticular layer of 4-0 Monocryl. Benzoin Steri-Strips were applied. The patient was then extubated and brought to the recovery room in stable condition. All sponge, instrument, and needle counts are correct.  Wilmon Arms. Corliss Skains, MD, [facility]  General/ Trauma Surgery  10/20/2023 10:06 AM

## 2023-10-20 NOTE — Interval H&P Note (Signed)
History and Physical Interval Note:  10/20/2023 8:31 AM  Kelly Dixon  has presented today for surgery, with the diagnosis of LEFT BREAST INVASIVE LOBULAR CARCINOMA.  The various methods of treatment have been discussed with the patient and family. After consideration of risks, benefits and other options for treatment, the patient has consented to  Procedure(s): LEFT BREAST LUMPECTOMY WITH RADIOACTIVE SEED LOCALIZATION (Left) as a surgical intervention.  The patient's history has been reviewed, patient examined, no change in status, stable for surgery.  I have reviewed the patient's chart and labs.  Questions were answered to the patient's satisfaction.     Wynona Luna

## 2023-10-20 NOTE — Transfer of Care (Signed)
Immediate Anesthesia Transfer of Care Note  Patient: Kelly Dixon  Procedure(s) Performed: LEFT BREAST LUMPECTOMY WITH RADIOACTIVE SEED LOCALIZATION (Left)  Patient Location: PACU  Anesthesia Type:General  Level of Consciousness: awake, alert , and oriented  Airway & Oxygen Therapy: Patient connected to face mask oxygen  Post-op Assessment: Report given to RN, Post -op Vital signs reviewed and stable, Patient moving all extremities, Patient moving all extremities X 4, and Patient able to stick tongue midline  Post vital signs: Reviewed and stable  Last Vitals:  Vitals Value Taken Time  BP 116/62 10/20/23 1006  Temp    Pulse 96 10/20/23 1008  Resp 17 10/20/23 1008  SpO2 98 % 10/20/23 1008  Vitals shown include unfiled device data.  Last Pain:  Vitals:   10/20/23 0822  TempSrc: Oral  PainSc:          Complications: No notable events documented.

## 2023-10-20 NOTE — Anesthesia Procedure Notes (Signed)
Procedure Name: LMA Insertion Date/Time: 10/20/2023 9:37 AM  Performed by: Venia Carbon, CRNAPre-anesthesia Checklist: Patient identified, Emergency Drugs available, Suction available, Patient being monitored and Timeout performed Patient Re-evaluated:Patient Re-evaluated prior to induction Oxygen Delivery Method: Circle system utilized Preoxygenation: Pre-oxygenation with 100% oxygen Induction Type: IV induction Ventilation: Mask ventilation without difficulty LMA: LMA inserted LMA Size: 4.0 Tube type: Oral Number of attempts: 1 Placement Confirmation: CO2 detector and breath sounds checked- equal and bilateral Tube secured with: Tape

## 2023-10-20 NOTE — Discharge Instructions (Signed)

## 2023-10-21 ENCOUNTER — Encounter (HOSPITAL_COMMUNITY): Payer: Self-pay | Admitting: Surgery

## 2023-10-21 NOTE — Anesthesia Postprocedure Evaluation (Signed)
Anesthesia Post Note  Patient: Kelly Dixon  Procedure(s) Performed: LEFT BREAST LUMPECTOMY WITH RADIOACTIVE SEED LOCALIZATION (Left)     Patient location during evaluation: PACU Anesthesia Type: General Level of consciousness: awake and alert Pain management: pain level controlled Vital Signs Assessment: post-procedure vital signs reviewed and stable Respiratory status: spontaneous breathing, nonlabored ventilation and respiratory function stable Cardiovascular status: blood pressure returned to baseline and stable Postop Assessment: no apparent nausea or vomiting Anesthetic complications: no   No notable events documented.  Last Vitals:  Vitals:   10/20/23 1030 10/20/23 1038  BP: 121/73 122/73  Pulse: 75 70  Resp: 18 20  Temp: 36.7 C   SpO2: 94% 97%    Last Pain:  Vitals:   10/20/23 1030  TempSrc:   PainSc: 0-No pain                 Loc Feinstein

## 2023-10-22 ENCOUNTER — Ambulatory Visit: Payer: Self-pay | Admitting: Surgery

## 2023-10-22 LAB — SURGICAL PATHOLOGY

## 2023-10-22 NOTE — H&P (View-Only) (Signed)
 Kelly Rudd, MD Physician Surgery   H&P     Signed   Encounter Date: 10/13/2023   Signed        Subjective    Chief Complaint: Breast Cancer       History of Present Illness: Kelly Dixon is a 78 y.o. female who is seen today as an office consultation at the request of Dr. Axel Dixon for evaluation of Breast Cancer .     Kelly Dixon/ Kelly Dixon   This is a 78 year old female status post right breast radioactive seed localized lumpectomy and sentinel lymph node biopsy in 2020 for invasive ductal carcinoma.  She was unable to tolerate antiestrogens.  She declined radiation at that time.   The patient recently underwent a routine screening mammogram that detected the suspicious mass in the left breast.  She underwent further workup that revealed a 0.5 x 0.6 x 0.4 cm mass located at 12:00, 7 cm from the nipple.  Pathology revealed invasive lobular carcinoma ER/PR positive, HER2 negative, Ki-67 8%. She has already been seen by radiation oncology who is planning postoperative adjuvant radiation.  On 10/20/2023, she underwent left breast radioactive seed localized lumpectomy.  The tumor measured 1.5 cm in diameter.  The inferior margin is focally positive.    Review of Systems: A complete review of systems was obtained from the patient.  I have reviewed this information and discussed as appropriate with the patient.  See HPI as well for other ROS.   Review of Systems  Constitutional: Negative.   HENT: Negative.    Eyes: Negative.   Respiratory: Negative.    Cardiovascular: Negative.   Gastrointestinal: Negative.   Genitourinary: Negative.   Musculoskeletal: Negative.   Skin: Negative.   Neurological: Negative.   Endo/Heme/Allergies: Negative.   Psychiatric/Behavioral: Negative.          Medical History: Past Medical History         Past Medical History:  Diagnosis Date   Arthritis     History of cancer          Problem List       Patient Active Problem List   Diagnosis   Malignant neoplasm of upper-outer quadrant of left female breast (CMS/HHS-HCC)   Status post total left knee replacement   Unilateral primary osteoarthritis, left knee   Invasive lobular carcinoma of breast, stage 1, left (CMS/HHS-HCC)        Past Surgical History           Past Surgical History:  Procedure Laterality Date   CHOLECYSTECTOMY       MASTECTOMY PARTIAL / LUMPECTOMY            Allergies  No Known Allergies      Medications Ordered Prior to Encounter             Current Outpatient Medications on File Prior to Visit  Medication Sig Dispense Refill   amLODIPine (NORVASC) 5 MG tablet Take 5 mg by mouth once daily       cyanocobalamin (VITAMIN B12) 1000 MCG tablet Take 1,000 mcg by mouth once daily       ferrous sulfate 325 (65 FE) MG EC tablet Take 325 mg by mouth daily with breakfast       acetaminophen (TYLENOL) 500 MG tablet Take 1,000 mg by mouth       fexofenadine (ALLEGRA) 180 MG tablet Take 180 mg by mouth       psyllium husk/calcium carb (PSYLLIUM HUSK-CALCIUM ORAL) Take  2 tablets by mouth        No current facility-administered medications on file prior to visit.        Family History           Family History  Problem Relation Age of Onset   Coronary Artery Disease (Blocked arteries around heart) Father     Coronary Artery Disease (Blocked arteries around heart) Brother          Tobacco Use History  Social History           Tobacco Use  Smoking Status Former   Types: Cigarettes  Smokeless Tobacco Never        Social History  Social History             Socioeconomic History   Marital status: Single  Tobacco Use   Smoking status: Former      Types: Cigarettes   Smokeless tobacco: Never  Substance and Sexual Activity   Alcohol use: Never   Drug use: Never    Social Drivers of Barista Strain: Medium Risk (02/10/2023)    Received from [facility]    Overall Financial Resource Strain  (CARDIA)     Difficulty of Paying Living Expenses: Somewhat hard  Food Insecurity: No Food Insecurity (02/10/2023)    Received from [facility]    Hunger Vital Sign     Worried About Running Out of Food in the Last Year: Never true     Ran Out of Food in the Last Year: Never true  Transportation Needs: No Transportation Needs (02/10/2023)    Received from [facility]     Lack of Transportation (Medical): No     Lack of Transportation (Non-Medical): No  Physical Activity: Insufficiently Active (02/10/2023)    Received from [facility]    Exercise Vital Sign     Days of Exercise per Week: 3 days     Minutes of Exercise per Session: 30 min  Stress: No Stress Concern Present (02/10/2023)    Received from [facility] - Occupational Stress Questionnaire     Feeling of Stress : Not at all  Social Connections: Unknown (02/10/2023)    Received from [facility]    Social Connection and Isolation Panel [NHANES]     Frequency of Communication with Friends and Family: More than three times a week     Frequency of Social Gatherings with Friends and Family: More than three times a week     Active Member of [employer] or Organizations: Yes     Attends Banker Meetings: More than 4 times per year     Marital Status: Divorced  Housing Stability: Unknown (10/13/2023)    Housing Stability Vital Sign     Homeless in the Last Year: No        Objective:            Body mass index is 38.37 kg/m.   Physical Exam    Constitutional:  WDWN in NAD, conversant, no obvious deformities; lying in bed comfortably Eyes:  Pupils equal, round; sclera anicteric; moist conjunctiva; no lid lag HENT:  Oral mucosa moist; good dentition  Neck:  No masses palpated, trachea midline; no thyromegaly Lungs:  CTA bilaterally; normal respiratory effort Breasts:  symmetric, no nipple changes; no palpable masses or lymphadenopathy on either  side, healed right axillary  and right circumareolar incisions, healing left upper breast incision CV:  Regular rate and rhythm; no murmurs; extremities well-perfused with no edema Abd:  +bowel sounds, soft, non-tender, no palpable organomegaly; no palpable hernias Musc: Normal gait; no apparent clubbing or cyanosis in extremities Lymphatic:  No palpable cervical or axillary lymphadenopathy Skin:  Warm, dry; no sign of jaundice Psychiatric - alert and oriented x 4; calm mood and affect     Labs, Imaging and Diagnostic Testing: A. LEFT BREAST, LUMPECTOMY:  Invasive lobular carcinoma, grade 2 (3+2+1)  Extensive lobular carcinoma in situ showing extensive ductal involvement  Tumor measures 1.5 x 1.4 x 1.1 cm (pT1c)  Invasive tumor and LCIS focally present in inferior margin (invasive  tumor 1 mm from posterior margin and 1.9 mm from anterior superior  margin)  Prognostic markers (from report SAA25-25): Estrogen receptor positive,  progesterone receptor positive, Ki-67 8% and HER2/neu oncoprotein  expression negative (0+)  Changes consistent with prior biopsy (coil clip)  Fibrocystic changes including stromal fibrosis, adenosis and usual duct  hyperplasia  Microcalcifications present within benign ducts and LCIS    Assessment and Plan:  Diagnoses and all orders for this visit:   Invasive lobular carcinoma of breast, stage 1, left (CMS/HHS-HCC)    Positive inferior margin  Reexcision of inferior margin - left breast lumpectomy.  The surgical procedure has been discussed with the patient.  Potential risks, benefits, alternative treatments, and expected outcomes have been explained.  All of the patient's questions at this time have been answered.  The likelihood of reaching the patient's treatment goal is good.  The patient understand the proposed surgical procedure and wishes to proceed.    Wilmon Arms. Corliss Skains, MD, [facility]  General Surgery   10/22/2023 3:14  PM

## 2023-10-22 NOTE — H&P (Signed)
[name], MD Physician Surgery   H&P     Signed   Encounter Date: 10/13/2023   Signed        Subjective    Chief Complaint: Breast Cancer       History of Present Illness: Kelly Dixon is a 78 y.o. female who is seen today as an office consultation at the request of Dr. Axel Filler for evaluation of Breast Cancer .     [name]/ [name]   This is a 78 year old female status post right breast radioactive seed localized lumpectomy and sentinel lymph node biopsy in 2020 for invasive ductal carcinoma.  She was unable to tolerate antiestrogens.  She declined radiation at that time.   The patient recently underwent a routine screening mammogram that detected the suspicious mass in the left breast.  She underwent further workup that revealed a 0.5 x 0.6 x 0.4 cm mass located at 12:00, 7 cm from the nipple.  Pathology revealed invasive lobular carcinoma ER/PR positive, HER2 negative, Ki-67 8%. She has already been seen by radiation oncology who is planning postoperative adjuvant radiation.  On 10/20/2023, she underwent left breast radioactive seed localized lumpectomy.  The tumor measured 1.5 cm in diameter.  The inferior margin is focally positive.    Review of Systems: A complete review of systems was obtained from the patient.  I have reviewed this information and discussed as appropriate with the patient.  See HPI as well for other ROS.   Review of Systems  Constitutional: Negative.   HENT: Negative.    Eyes: Negative.   Respiratory: Negative.    Cardiovascular: Negative.   Gastrointestinal: Negative.   Genitourinary: Negative.   Musculoskeletal: Negative.   Skin: Negative.   Neurological: Negative.   Endo/Heme/Allergies: Negative.   Psychiatric/Behavioral: Negative.          Medical History: Past Medical History         Past Medical History:  Diagnosis Date   Arthritis     History of cancer          Problem List       Patient Active Problem List   Diagnosis   Malignant neoplasm of upper-outer quadrant of left female breast (CMS/HHS-HCC)   Status post total left knee replacement   Unilateral primary osteoarthritis, left knee   Invasive lobular carcinoma of breast, stage 1, left (CMS/HHS-HCC)        Past Surgical History           Past Surgical History:  Procedure Laterality Date   CHOLECYSTECTOMY       MASTECTOMY PARTIAL / LUMPECTOMY            Allergies  No Known Allergies      Medications Ordered Prior to Encounter             Current Outpatient Medications on File Prior to Visit  Medication Sig Dispense Refill   amLODIPine (NORVASC) 5 MG tablet Take 5 mg by mouth once daily       cyanocobalamin (VITAMIN B12) 1000 MCG tablet Take 1,000 mcg by mouth once daily       ferrous sulfate 325 (65 FE) MG EC tablet Take 325 mg by mouth daily with breakfast       acetaminophen (TYLENOL) 500 MG tablet Take 1,000 mg by mouth       fexofenadine (ALLEGRA) 180 MG tablet Take 180 mg by mouth       psyllium husk/calcium carb (PSYLLIUM HUSK-CALCIUM ORAL) Take  2 tablets by mouth        No current facility-administered medications on file prior to visit.        Family History           Family History  Problem Relation Age of Onset   Coronary Artery Disease (Blocked arteries around heart) Father     Coronary Artery Disease (Blocked arteries around heart) Brother          Tobacco Use History  Social History           Tobacco Use  Smoking Status Former   Types: Cigarettes  Smokeless Tobacco Never        Social History  Social History             Socioeconomic History   Marital status: Single  Tobacco Use   Smoking status: Former      Types: Cigarettes   Smokeless tobacco: Never  Substance and Sexual Activity   Alcohol use: Never   Drug use: Never    Social Drivers of Barista Strain: Medium Risk (02/10/2023)    Received from [facility]    Overall Financial Resource Strain  (CARDIA)     Difficulty of Paying Living Expenses: Somewhat hard  Food Insecurity: No Food Insecurity (02/10/2023)    Received from [facility]    Hunger Vital Sign     Worried About Running Out of Food in the Last Year: Never true     Ran Out of Food in the Last Year: Never true  Transportation Needs: No Transportation Needs (02/10/2023)    Received from [facility] - Transportation     Lack of Transportation (Medical): No     Lack of Transportation (Non-Medical): No  Physical Activity: Insufficiently Active (02/10/2023)    Received from [facility]    Exercise Vital Sign     Days of Exercise per Week: 3 days     Minutes of Exercise per Session: 30 min  Stress: No Stress Concern Present (02/10/2023)    Received from [facility] - Occupational Stress Questionnaire     Feeling of Stress : Not at all  Social Connections: Unknown (02/10/2023)    Received from [facility]    Social Connection and Isolation Panel [NHANES]     Frequency of Communication with Friends and Family: More than three times a week     Frequency of Social Gatherings with Friends and Family: More than three times a week     Active Member of [employer] or Organizations: Yes     Attends Banker Meetings: More than 4 times per year     Marital Status: Divorced  Housing Stability: Unknown (10/13/2023)    Housing Stability Vital Sign     Homeless in the Last Year: No        Objective:            Body mass index is 38.37 kg/m.   Physical Exam    Constitutional:  WDWN in NAD, conversant, no obvious deformities; lying in bed comfortably Eyes:  Pupils equal, round; sclera anicteric; moist conjunctiva; no lid lag HENT:  Oral mucosa moist; good dentition  Neck:  No masses palpated, trachea midline; no thyromegaly Lungs:  CTA bilaterally; normal respiratory effort Breasts:  symmetric, no nipple changes; no palpable masses or lymphadenopathy on either  side, healed right axillary  and right circumareolar incisions, healing left upper breast incision CV:  Regular rate and rhythm; no murmurs; extremities well-perfused with no edema Abd:  +bowel sounds, soft, non-tender, no palpable organomegaly; no palpable hernias Musc: Normal gait; no apparent clubbing or cyanosis in extremities Lymphatic:  No palpable cervical or axillary lymphadenopathy Skin:  Warm, dry; no sign of jaundice Psychiatric - alert and oriented x 4; calm mood and affect     Labs, Imaging and Diagnostic Testing: A. LEFT BREAST, LUMPECTOMY:  Invasive lobular carcinoma, grade 2 (3+2+1)  Extensive lobular carcinoma in situ showing extensive ductal involvement  Tumor measures 1.5 x 1.4 x 1.1 cm (pT1c)  Invasive tumor and LCIS focally present in inferior margin (invasive  tumor 1 mm from posterior margin and 1.9 mm from anterior superior  margin)  Prognostic markers (from report [id number]): Estrogen receptor positive,  progesterone receptor positive, Ki-67 8% and HER2/neu oncoprotein  expression negative (0+)  Changes consistent with prior biopsy (coil clip)  Fibrocystic changes including stromal fibrosis, adenosis and usual duct  hyperplasia  Microcalcifications present within benign ducts and LCIS    Assessment and Plan:  Diagnoses and all orders for this visit:   Invasive lobular carcinoma of breast, stage 1, left (CMS/HHS-HCC)    Positive inferior margin  Reexcision of inferior margin - left breast lumpectomy.  The surgical procedure has been discussed with the patient.  Potential risks, benefits, alternative treatments, and expected outcomes have been explained.  All of the patient's questions at this time have been answered.  The likelihood of reaching the patient's treatment goal is good.  The patient understand the proposed surgical procedure and wishes to proceed.    Wilmon Arms. Corliss Skains, MD, [facility]  General Surgery   10/22/2023 3:14  PM

## 2023-10-24 ENCOUNTER — Encounter: Payer: Self-pay | Admitting: *Deleted

## 2023-10-28 ENCOUNTER — Ambulatory Visit (HOSPITAL_BASED_OUTPATIENT_CLINIC_OR_DEPARTMENT_OTHER)
Admission: RE | Admit: 2023-10-28 | Discharge: 2023-10-28 | Disposition: A | Discharge status: Home / Self Care | Payer: Medicare HMO | Source: Ambulatory Visit | Attending: Family Medicine | Admitting: Family Medicine

## 2023-10-28 ENCOUNTER — Ambulatory Visit (INDEPENDENT_AMBULATORY_CARE_PROVIDER_SITE_OTHER): Payer: Medicare HMO | Admitting: Family Medicine

## 2023-10-28 ENCOUNTER — Ambulatory Visit: Payer: Self-pay | Admitting: Family Medicine

## 2023-10-28 VITALS — BP 109/74 | HR 98 | Temp 97.7°F | Ht 62.0 in | Wt 202.0 lb

## 2023-10-28 DIAGNOSIS — R112 Nausea with vomiting, unspecified: Secondary | ICD-10-CM | POA: Diagnosis not present

## 2023-10-28 DIAGNOSIS — N200 Calculus of kidney: Secondary | ICD-10-CM | POA: Diagnosis not present

## 2023-10-28 DIAGNOSIS — K56609 Unspecified intestinal obstruction, unspecified as to partial versus complete obstruction: Secondary | ICD-10-CM | POA: Diagnosis not present

## 2023-10-28 DIAGNOSIS — R1030 Lower abdominal pain, unspecified: Secondary | ICD-10-CM

## 2023-10-28 DIAGNOSIS — K449 Diaphragmatic hernia without obstruction or gangrene: Secondary | ICD-10-CM | POA: Diagnosis not present

## 2023-10-28 MED ORDER — IOHEXOL 300 MG/ML  SOLN
100.0000 mL | Freq: Once | INTRAMUSCULAR | Status: AC | PRN
  Administered 2023-10-28: 100 mL via INTRAVENOUS

## 2023-10-28 MED ORDER — ONDANSETRON 8 MG PO TBDP: 8.0000 mg | ORAL_TABLET | Freq: Three times a day (TID) | ORAL | 0 refills | Status: DC | PRN

## 2023-10-28 NOTE — Telephone Encounter (Signed)
Chief Complaint: vomiting Symptoms: vomiting, abdominal pain Frequency: onset of symptoms last night, vomited 5-6x last night, none today Pertinent Negatives: Patient denies fever, abdominal bloating, constipation, CP, SOB, coffee ground emesis Disposition: [] ED /[] Urgent Care (no appt availability in office) / [x] Appointment(In office/virtual)/ []  [facility]/ [] Home Care/ [] Refused Recommended Disposition /[] [facility]/ []  Follow-up with PCP Additional Notes: Pt reports vomiting beginning last night. 5-6 episodes of vomiting or more. None today. Pt endorses sharp abdominal pain last night, states today the pain is 4/10 and generalized. Pt states she has a hx of an SBO and is concerned she may have another obstruction. Pt states vomiting feels similar. Denies constipation - states bowel movements have been normal except for some diarrhea last night. Denies abdominal bloating but states it is hard to tell bc she is overweight. Denies coffee ground emesis. States she had grilled chicken and mac and cheese yesterday for lunch and doubts this is food poisoning. Denies fever as far as she is aware. Denies sick contacts. Per protocol pt scheduled in office today at 1100. Pt verbalized understanding, agreeable to plan.    Copied from CRM [id number]. Topic: Clinical - Red Word Triage >> Oct 28, 2023  9:50 AM Pascal Lux wrote: Red Word that prompted transfer to Nurse Triage: Patient stated she's been throwing up all day yesterday and all night and is afraid to eat because she may throw up.Stated her temp was a little elevated this morning. Reason for Disposition  [1] Constant abdominal pain AND [2] present > 2 hours  Answer Assessment - Initial Assessment Questions 1. VOMITING SEVERITY: "How many times have you vomited in the past 24 hours?"     - MILD:  1 - 2 times/day    - MODERATE: 3 - 5 times/day, decreased oral intake without significant weight loss or symptoms of dehydration    -  SEVERE: 6 or more times/day, vomits everything or nearly everything, with significant weight loss, symptoms of dehydration      At least 5-6x, probably more 2. ONSET: "When did the vomiting begin?"      Yesterday - "Bowel blockage last year. This throwing up I did yesterday is so similar. Today my stomach feels sore. A little bloated, not more than normal. Ate one meal yesterday." 3. FLUIDS: "What fluids or food have you vomited up today?" "Have you been able to keep any fluids down?"     Ate lunch yesterday. Possible I could have gotten food poisoning, but seems unlikely.No vomiting today - nothing to eat today. Did drink some water and soda today, kept that down.  4. ABDOMEN PAIN: "Are your having any abdomen pain?" If Yes : "How bad is it and what does it feel like?" (e.g., crampy, dull, intermittent, constant)      "Not very bad, 4/10. Same everywhere, not worse in one place. Worse last night. Sharp sharp pain last night." 5. DIARRHEA: "Is there any diarrhea?" If Yes, ask: "How many times today?"      Some diarrhea with vomiting yesterday, none today. 6. CONTACTS: "Is there anyone else in the family with the same symptoms?"      No, not been around people 7. CAUSE: "What do you think is causing your vomiting?"     Not sure - SBO or food poisoning? 8. HYDRATION STATUS: "Any signs of dehydration?" (e.g., dry mouth [not only dry lips], too weak to stand) "When did you last urinate?"     Urinating today 9. OTHER  SYMPTOMS: "Do you have any other symptoms?" (e.g., fever, headache, vertigo, vomiting blood or coffee grounds, recent head injury)     No recent constipation, thought I had a temp this morning but don't think I do, just drained  Protocols used: Vomiting-A-AH

## 2023-10-28 NOTE — Progress Notes (Signed)
Acute Office Visit  Subjective:     Patient ID: Kelly Dixon, female    DOB: 12-25-1945, 78 y.o.   MRN: [id number]  Chief Complaint  Patient presents with   Emesis     Patient is in today for lower abdominal pain and nausea/vomiting.    Discussed the use of AI scribe software for clinical note transcription with the patient, who gave verbal consent to proceed.  History of Present Illness   The patient, with a history of bowel obstruction, presents with abdominal pain, nausea, vomiting, and diarrhea.  She has been experiencing abdominal pain, nausea, vomiting, and diarrhea since yesterday afternoon around 2 PM. States she has had no sick contacts and did not eat anything unusual yesterday (only had [employer] for lunch). Initially, she felt nausea and heartburn, for which she took an antacid. Vomiting began around 5 PM and continued until midnight, with approximately five to six episodes. She also experienced diarrhea, noting that she 'filled up the diaper' during the second vomiting episode. This morning, she felt feverish but did not have a confirmed fever. She reports a sharp abdominal pain that worsens with movement and drinking fluids, rating it a 6 out of 10 in severity, increasing to 7 or 8 with palpation. The pain is located in the lower abdomen, more to the left side.  She has a history of a bowel obstruction diagnosed in December 2023, which resolved with NG tube. The current abdominal pain is similar to the previous episode. No blood in vomit or stool and no recent contact with anyone sick. She has had normal bowel movements prior to this episode.             All review of systems negative except what is listed in the HPI      Objective:    BP 109/74   Pulse 98   Temp 97.7 F (36.5 C)   Ht 5\' 2"  (1.575 m)   Wt 202 lb (91.6 kg)   SpO2 96%   BMI 36.95 kg/m    Physical Exam Vitals reviewed.  Constitutional:      General: She is not in acute distress.     Appearance: Normal appearance. She is obese. She is not ill-appearing.  Cardiovascular:     Rate and Rhythm: Normal rate and regular rhythm.  Pulmonary:     Effort: Pulmonary effort is normal.     Breath sounds: Normal breath sounds.  Abdominal:     General: Abdomen is flat.     Palpations: Abdomen is soft.     Tenderness: There is abdominal tenderness in the periumbilical area, suprapubic area and left lower quadrant.     Comments: Quiet bowel sounds  Neurological:     Mental Status: She is alert and oriented to person, place, and time.  Psychiatric:        Mood and Affect: Mood normal.        Behavior: Behavior normal.        Thought Content: Thought content normal.        Judgment: Judgment normal.         No results found for any visits on 10/28/23.      Assessment & Plan:   Problem List Items Addressed This Visit   None Visit Diagnoses       Lower abdominal pain    -  Primary   Relevant Orders   CT ABDOMEN PELVIS W CONTRAST     Nausea and vomiting, unspecified vomiting  type       Relevant Medications   ondansetron (ZOFRAN-ODT) 8 MG disintegrating tablet   Other Relevant Orders   CT ABDOMEN PELVIS W CONTRAST       Patient presents with acute onset of abdominal pain, nausea, vomiting, and diarrhea. Patient has a history of bowel obstruction and is concerned about recurrence. Pain is generalized but more so to LLQ, worse with movement, and rated as 6/10 at rest, increasing to 7-8/10 with palpation. -Order CT scan to rule out bowel obstruction given history and pain on exam. -Prescribe Zofran for nausea -Advise clear liquid diet for now. -CBC/CMP stable last week. She prefers NOT to recheck today.          Meds ordered this encounter  Medications   ondansetron (ZOFRAN-ODT) 8 MG disintegrating tablet    Sig: Take 1 tablet (8 mg total) by mouth every 8 (eight) hours as needed for nausea or vomiting.    Dispense:  20 tablet    Refill:  0    Supervising  Provider:   Danise Edge A [[id number]]    Return if symptoms worsen or fail to improve.  Clayborne Dana, NP

## 2023-10-28 NOTE — Telephone Encounter (Signed)
Pt scheduled for OV today 10/28/2023.

## 2023-10-29 ENCOUNTER — Other Ambulatory Visit: Payer: Self-pay

## 2023-10-29 ENCOUNTER — Telehealth: Payer: Self-pay

## 2023-10-29 ENCOUNTER — Telehealth: Payer: Medicare HMO | Admitting: Family Medicine

## 2023-10-29 ENCOUNTER — Encounter: Payer: Self-pay | Admitting: *Deleted

## 2023-10-29 ENCOUNTER — Encounter (HOSPITAL_BASED_OUTPATIENT_CLINIC_OR_DEPARTMENT_OTHER): Payer: Self-pay | Admitting: Surgery

## 2023-10-29 ENCOUNTER — Encounter: Payer: Self-pay | Admitting: Family Medicine

## 2023-10-29 NOTE — Telephone Encounter (Signed)
Please advise

## 2023-10-29 NOTE — Telephone Encounter (Signed)
Copied from CRM [id number]. Topic: Clinical - Lab/Test Results >> Oct 29, 2023  8:17 AM Almira Coaster wrote: Reason for CRM: Patient completed a CT scan done yesterday, per patient they released results on Mychart; however, she is unable to connect to view them.

## 2023-10-29 NOTE — Telephone Encounter (Signed)
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 Spoke with provider and he stated it is contagious, Pt advised not safe and is contagious.Pt stated understand.

## 2023-10-29 NOTE — Telephone Encounter (Signed)
Copied from CRM [id number]. 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 Pt wants to know how long will the stomach bug last. Pt is requesting to speak with her provider in regards to her CT results, Please give pt a call back at [phone]

## 2023-10-29 NOTE — Telephone Encounter (Signed)
Patient made aware of results.

## 2023-11-04 ENCOUNTER — Ambulatory Visit (HOSPITAL_BASED_OUTPATIENT_CLINIC_OR_DEPARTMENT_OTHER): Payer: Medicare HMO | Admitting: Anesthesiology

## 2023-11-04 ENCOUNTER — Other Ambulatory Visit: Payer: Self-pay

## 2023-11-04 ENCOUNTER — Ambulatory Visit (HOSPITAL_BASED_OUTPATIENT_CLINIC_OR_DEPARTMENT_OTHER)
Admission: RE | Admit: 2023-11-04 | Discharge: 2023-11-04 | Disposition: A | Discharge status: Home / Self Care | Payer: Medicare HMO | Attending: Surgery | Admitting: Surgery

## 2023-11-04 ENCOUNTER — Encounter (HOSPITAL_BASED_OUTPATIENT_CLINIC_OR_DEPARTMENT_OTHER)
Admission: RE | Disposition: A | Discharge status: Home / Self Care | Payer: Self-pay | Source: Home / Self Care | Attending: Surgery

## 2023-11-04 ENCOUNTER — Encounter (HOSPITAL_BASED_OUTPATIENT_CLINIC_OR_DEPARTMENT_OTHER): Payer: Self-pay | Admitting: Surgery

## 2023-11-04 DIAGNOSIS — Z5986 Financial insecurity: Secondary | ICD-10-CM | POA: Insufficient documentation

## 2023-11-04 DIAGNOSIS — Z170 Estrogen receptor positive status [ER+]: Secondary | ICD-10-CM | POA: Insufficient documentation

## 2023-11-04 DIAGNOSIS — C50812 Malignant neoplasm of overlapping sites of left female breast: Secondary | ICD-10-CM | POA: Diagnosis not present

## 2023-11-04 DIAGNOSIS — Z01818 Encounter for other preprocedural examination: Secondary | ICD-10-CM

## 2023-11-04 DIAGNOSIS — I10 Essential (primary) hypertension: Secondary | ICD-10-CM | POA: Diagnosis not present

## 2023-11-04 DIAGNOSIS — C50912 Malignant neoplasm of unspecified site of left female breast: Secondary | ICD-10-CM | POA: Diagnosis not present

## 2023-11-04 DIAGNOSIS — Z87891 Personal history of nicotine dependence: Secondary | ICD-10-CM | POA: Diagnosis not present

## 2023-11-04 DIAGNOSIS — D0512 Intraductal carcinoma in situ of left breast: Secondary | ICD-10-CM | POA: Diagnosis not present

## 2023-11-04 DIAGNOSIS — D0502 Lobular carcinoma in situ of left breast: Secondary | ICD-10-CM | POA: Diagnosis not present

## 2023-11-04 DIAGNOSIS — N6032 Fibrosclerosis of left breast: Secondary | ICD-10-CM | POA: Insufficient documentation

## 2023-11-04 HISTORY — PX: RE-EXCISION OF BREAST LUMPECTOMY: SHX6048

## 2023-11-04 SURGERY — EXCISION, LESION, BREAST
Anesthesia: General | Site: Breast | Laterality: Left

## 2023-11-04 MED ORDER — CHLORHEXIDINE GLUCONATE CLOTH 2 % EX PADS: 6.0000 | MEDICATED_PAD | Freq: Once | CUTANEOUS | Status: DC

## 2023-11-04 MED ORDER — PROPOFOL 10 MG/ML IV BOLUS
INTRAVENOUS | Status: DC | PRN
  Administered 2023-11-04: 150 mg via INTRAVENOUS
  Administered 2023-11-04: 10 mg via INTRAVENOUS

## 2023-11-04 MED ORDER — FENTANYL CITRATE (PF) 100 MCG/2ML IJ SOLN: 25.0000 ug | INTRAMUSCULAR | Status: DC | PRN

## 2023-11-04 MED ORDER — CEFAZOLIN SODIUM-DEXTROSE 2-4 GM/100ML-% IV SOLN
INTRAVENOUS | Status: AC
  Filled 2023-11-04: qty 100

## 2023-11-04 MED ORDER — LACTATED RINGERS IV SOLN: INTRAVENOUS | Status: DC

## 2023-11-04 MED ORDER — ACETAMINOPHEN 500 MG PO TABS
1000.0000 mg | ORAL_TABLET | Freq: Once | ORAL | Status: AC
  Administered 2023-11-04: 1000 mg via ORAL

## 2023-11-04 MED ORDER — ONDANSETRON HCL 4 MG/2ML IJ SOLN
INTRAMUSCULAR | Status: AC
  Filled 2023-11-04: qty 2

## 2023-11-04 MED ORDER — DEXAMETHASONE SODIUM PHOSPHATE 4 MG/ML IJ SOLN
INTRAMUSCULAR | Status: DC | PRN
  Administered 2023-11-04: 5 mg via INTRAVENOUS

## 2023-11-04 MED ORDER — DEXAMETHASONE SODIUM PHOSPHATE 10 MG/ML IJ SOLN
INTRAMUSCULAR | Status: AC
  Filled 2023-11-04: qty 1

## 2023-11-04 MED ORDER — PHENYLEPHRINE HCL (PRESSORS) 10 MG/ML IV SOLN
INTRAVENOUS | Status: DC | PRN
  Administered 2023-11-04: 80 ug via INTRAVENOUS
  Administered 2023-11-04: 160 ug via INTRAVENOUS

## 2023-11-04 MED ORDER — BUPIVACAINE-EPINEPHRINE 0.25% -1:200000 IJ SOLN
INTRAMUSCULAR | Status: DC | PRN
  Administered 2023-11-04: 10 mL

## 2023-11-04 MED ORDER — PROPOFOL 500 MG/50ML IV EMUL
INTRAVENOUS | Status: DC | PRN
  Administered 2023-11-04: 150 ug/kg/min via INTRAVENOUS

## 2023-11-04 MED ORDER — FENTANYL CITRATE (PF) 100 MCG/2ML IJ SOLN
INTRAMUSCULAR | Status: AC
  Filled 2023-11-04: qty 2

## 2023-11-04 MED ORDER — ONDANSETRON HCL 4 MG/2ML IJ SOLN
INTRAMUSCULAR | Status: DC | PRN
  Administered 2023-11-04: 4 mg via INTRAVENOUS

## 2023-11-04 MED ORDER — FENTANYL CITRATE (PF) 100 MCG/2ML IJ SOLN
INTRAMUSCULAR | Status: DC | PRN
  Administered 2023-11-04 (×2): 50 ug via INTRAVENOUS

## 2023-11-04 MED ORDER — 0.9 % SODIUM CHLORIDE (POUR BTL) OPTIME
TOPICAL | Status: DC | PRN
  Administered 2023-11-04: 700 mL

## 2023-11-04 MED ORDER — ACETAMINOPHEN 500 MG PO TABS
ORAL_TABLET | ORAL | Status: AC
  Filled 2023-11-04: qty 2

## 2023-11-04 SURGICAL SUPPLY — 37 items
BENZOIN TINCTURE PRP APPL 2/3 (GAUZE/BANDAGES/DRESSINGS) ×1 IMPLANT
BLADE HEX COATED 2.75 (ELECTRODE) ×1 IMPLANT
BLADE SURG 15 STRL LF DISP TIS (BLADE) ×1 IMPLANT
CANISTER SUCT 1200ML W/VALVE (MISCELLANEOUS) IMPLANT
CHLORAPREP W/TINT 26 (MISCELLANEOUS) ×1 IMPLANT
COVER BACK TABLE 60X90IN (DRAPES) ×1 IMPLANT
COVER MAYO STAND STRL (DRAPES) ×1 IMPLANT
DRAPE LAPAROTOMY 100X72 PEDS (DRAPES) ×1 IMPLANT
DRAPE UTILITY XL STRL (DRAPES) ×1 IMPLANT
DRSG TEGADERM 4X4.75 (GAUZE/BANDAGES/DRESSINGS) ×1 IMPLANT
ELECT REM PT RETURN 9FT ADLT (ELECTROSURGICAL) ×1
ELECTRODE REM PT RTRN 9FT ADLT (ELECTROSURGICAL) ×1 IMPLANT
GAUZE SPONGE 2X2 STRL 8-PLY (GAUZE/BANDAGES/DRESSINGS) IMPLANT
GAUZE SPONGE 4X4 12PLY STRL LF (GAUZE/BANDAGES/DRESSINGS) IMPLANT
GLOVE BIO SURGEON STRL SZ7 (GLOVE) ×1 IMPLANT
GLOVE BIOGEL PI IND STRL 7.5 (GLOVE) ×1 IMPLANT
GOWN STRL REUS W/ TWL LRG LVL3 (GOWN DISPOSABLE) ×2 IMPLANT
KIT MARKER MARGIN INK (KITS) IMPLANT
NDL HYPO 25X1 1.5 SAFETY (NEEDLE) ×1 IMPLANT
NEEDLE HYPO 25X1 1.5 SAFETY (NEEDLE) ×1
NS IRRIG 1000ML POUR BTL (IV SOLUTION) ×1 IMPLANT
PACK BASIN DAY SURGERY FS (CUSTOM PROCEDURE TRAY) ×1 IMPLANT
PENCIL SMOKE EVACUATOR (MISCELLANEOUS) ×1 IMPLANT
SLEEVE SCD COMPRESS KNEE MED (STOCKING) ×1 IMPLANT
SPIKE FLUID TRANSFER (MISCELLANEOUS) IMPLANT
SPONGE T-LAP 4X18 ~~LOC~~+RFID (SPONGE) ×1 IMPLANT
STRIP CLOSURE SKIN 1/2X4 (GAUZE/BANDAGES/DRESSINGS) ×1 IMPLANT
SUT CHROMIC 3 0 SH 27 (SUTURE) IMPLANT
SUT MON AB 4-0 PC3 18 (SUTURE) ×1 IMPLANT
SUT SILK 2 0 SH (SUTURE) IMPLANT
SUT VIC AB 3-0 SH 27X BRD (SUTURE) ×1 IMPLANT
SYR BULB EAR ULCER 3OZ GRN STR (SYRINGE) ×1 IMPLANT
SYR CONTROL 10ML LL (SYRINGE) ×1 IMPLANT
TOWEL GREEN STERILE FF (TOWEL DISPOSABLE) ×1 IMPLANT
TRAY FAXITRON CT DISP (TRAY / TRAY PROCEDURE) IMPLANT
TUBE CONNECTING 20X1/4 (TUBING) ×1 IMPLANT
YANKAUER SUCT BULB TIP NO VENT (SUCTIONS) ×1 IMPLANT

## 2023-11-04 NOTE — Interval H&P Note (Signed)
 History and Physical Interval Note:  11/04/2023 10:09 AM  Kelly Dixon  has presented today for surgery, with the diagnosis of INVASIVE LOBULAR CARCINOMA LEFT BREAST.  The various methods of treatment have been discussed with the patient and family. After consideration of risks, benefits and other options for treatment, the patient has consented to  Procedure(s) with comments: RE-EXCISION OF INFERIOR MARGIN-LEFT BREAST LUMPECTOMY (Left) - LMA as a surgical intervention.  The patient's history has been reviewed, patient examined, no change in status, stable for surgery.  I have reviewed the patient's chart and labs.  Questions were answered to the patient's satisfaction.     Kelly Dixon

## 2023-11-04 NOTE — Op Note (Signed)
 Pre-op diagnosis: Invasive lobular carcinoma left breast with positive inferior margin Postop diagnosis: Same Procedure performed: Reexcision of inferior margin left breast lumpectomy Surgeon:Vir Whetstine K Freddrick Gladson Anesthesia: General Indications:This is a 78 year old female status post right breast radioactive seed localized lumpectomy and sentinel lymph node biopsy in 2020 for invasive ductal carcinoma.  She was unable to tolerate antiestrogens.  She declined radiation at that time.   The patient recently underwent a routine screening mammogram that detected the suspicious mass in the left breast.  She underwent further workup that revealed a 0.5 x 0.6 x 0.4 cm mass located at 12:00, 7 cm from the nipple.  Pathology revealed invasive lobular carcinoma ER/PR positive, HER2 negative, Ki-67 8%. She has already been seen by radiation oncology who is planning postoperative adjuvant radiation.   On 10/20/2023, she underwent left breast radioactive seed localized lumpectomy.  The tumor measured 1.5 cm in diameter.  The inferior margin is focally positive.  She presents now for reexcision of the inferior margin.  Description of procedure: The patient is brought to the operating room and placed in the supine position on the operating room table.  After an adequate level of general anesthesia was obtained, the patient's left breast was prepped with ChloraPrep and draped sterile fashion.  A timeout was taken to ensure the proper patient and proper procedure.  We infiltrated local anesthetic around her previous incision.  I incised the incision and we dissected through the dermis into the lumpectomy cavity.  We evacuated the seroma.  There is no sign of infection.  We placed our retractors and exposed the inferior margin.  I excised a 2 cm x 3 cm section of the inferior margin with a 1 cm thickness.  We excised this all the way down to the chest wall.  The specimen was then oriented with a paint kit.  We inspected  carefully for hemostasis.  We irrigated the wound thoroughly.  The wound was closed with a deep layer of 3-0 Vicryl and a subcuticular layer 4-0 Monocryl.  Benzoin and Steri-Strips were applied.  The patient was then extubated and brought to the recovery room in stable condition.  All sponge, instrument, and needle counts are correct.  Donnice POUR. Belinda, MD, [facility]  General Surgery   11/04/2023 11:21 AM

## 2023-11-04 NOTE — Anesthesia Postprocedure Evaluation (Signed)
 Anesthesia Post Note  Patient: Kelly Dixon  Procedure(s) Performed: RE-EXCISION OF INFERIOR MARGIN-LEFT BREAST LUMPECTOMY (Left: Breast)     Patient location during evaluation: PACU Anesthesia Type: General Level of consciousness: awake and alert Pain management: pain level controlled Vital Signs Assessment: post-procedure vital signs reviewed and stable Respiratory status: spontaneous breathing, nonlabored ventilation, respiratory function stable and patient connected to nasal cannula oxygen Cardiovascular status: blood pressure returned to baseline and stable Postop Assessment: no apparent nausea or vomiting Anesthetic complications: no  No notable events documented.  Last Vitals:  Vitals:   11/04/23 1145 11/04/23 1202  BP: 115/71 133/67  Pulse: 67 60  Resp: 18 16  Temp:  (!) 36.3 C  SpO2: 98% 97%    Last Pain:  Vitals:   11/04/23 1202  TempSrc: Temporal  PainSc: 0-No pain                 Kule Gascoigne L Efrain Clauson

## 2023-11-04 NOTE — Discharge Instructions (Addendum)
[facility] Office Phone Number [phone]  BREAST BIOPSY/ PARTIAL MASTECTOMY: POST OP INSTRUCTIONS  Always review your discharge instruction sheet given to you by the facility where your surgery was performed.  IF YOU HAVE DISABILITY OR FAMILY LEAVE FORMS, YOU MUST BRING THEM TO THE OFFICE FOR PROCESSING.  DO NOT GIVE THEM TO YOUR DOCTOR.  A prescription for pain medication may be given to you upon discharge.  Take your pain medication as prescribed, if needed.  If narcotic pain medicine is not needed, then you may take acetaminophen (Tylenol) or ibuprofen (Advil) as needed. No Tylenol until after 2:45pm today, if needed.  Take your usually prescribed medications unless otherwise directed If you need a refill on your pain medication, please contact your pharmacy.  They will contact our office to request authorization.  Prescriptions will not be filled after 5pm or on week-ends. You should eat very light the first 24 hours after surgery, such as soup, crackers, pudding, etc.  Resume your normal diet the day after surgery. Most patients will experience some swelling and bruising in the breast.  Ice packs and a good support bra will help.  Swelling and bruising can take several days to resolve.  It is common to experience some constipation if taking pain medication after surgery.  Increasing fluid intake and taking a stool softener will usually help or prevent this problem from occurring.  A mild laxative (Milk of Magnesia or Miralax) should be taken according to package directions if there are no bowel movements after 48 hours. Unless discharge instructions indicate otherwise, you may remove your bandages 24-48 hours after surgery, and you may shower at that time.  You may have steri-strips (small skin tapes) in place directly over the incision.  These strips should be left on the skin for 7-10 days.  If your surgeon used skin glue on the incision, you may shower in 24 hours.  The  glue will flake off over the next 2-3 weeks.  Any sutures or staples will be removed at the office during your follow-up visit. ACTIVITIES:  You may resume regular daily activities (gradually increasing) beginning the next day.  Wearing a good support bra or sports bra minimizes pain and swelling.  You may have sexual intercourse when it is comfortable. You may drive when you no longer are taking prescription pain medication, you can comfortably wear a seatbelt, and you can safely maneuver your car and apply brakes. RETURN TO WORK:  ______________________________________________________________________________________ Kelly Dixon should see your doctor in the office for a follow-up appointment approximately two weeks after your surgery.  Your doctor's nurse will typically make your follow-up appointment when she calls you with your pathology report.  Expect your pathology report 2-3 business days after your surgery.  You may call to check if you do not hear from [place] after three days. OTHER INSTRUCTIONS: _______________________________________________________________________________________________ _____________________________________________________________________________________________________________________________________ _____________________________________________________________________________________________________________________________________ _____________________________________________________________________________________________________________________________________  WHEN TO CALL YOUR DOCTOR: Fever over 101.0 Nausea and/or vomiting. Extreme swelling or bruising. Continued bleeding from incision. Increased pain, redness, or drainage from the incision.  The clinic staff is available to answer your questions during regular business hours.  Please don't hesitate to call and ask to speak to one of the nurses for clinical concerns.  If you have a medical emergency, go to the nearest  emergency room or call 911.  A surgeon from [facility] is always on call at the hospital.  For further questions, please visit [email or web address]   Post Anesthesia Home Care Instructions  Activity:  Get plenty of rest for the remainder of the day. A responsible individual must stay with you for 24 hours following the procedure.  For the next 24 hours, DO NOT: -Drive a car -Advertising copywriter -Drink alcoholic beverages -Take any medication unless instructed by your physician -Make any legal decisions or sign important papers.  Meals: Start with liquid foods such as gelatin or soup. Progress to regular foods as tolerated. Avoid greasy, spicy, heavy foods. If nausea and/or vomiting occur, drink only clear liquids until the nausea and/or vomiting subsides. Call your physician if vomiting continues.  Special Instructions/Symptoms: Your throat may feel dry or sore from the anesthesia or the breathing tube placed in your throat during surgery. If this causes discomfort, gargle with warm salt water. The discomfort should disappear within 24 hours.  If you had a scopolamine patch placed behind your ear for the management of post- operative nausea and/or vomiting:  1. The medication in the patch is effective for 72 hours, after which it should be removed.  Wrap patch in a tissue and discard in the trash. Wash hands thoroughly with soap and water. 2. You may remove the patch earlier than 72 hours if you experience unpleasant side effects which may include dry mouth, dizziness or visual disturbances. 3. Avoid touching the patch. Wash your hands with soap and water after contact with the patch.

## 2023-11-04 NOTE — Anesthesia Preprocedure Evaluation (Addendum)
Anesthesia Evaluation  Patient identified by MRN, date of birth, ID band Patient awake    Reviewed: Allergy & Precautions, NPO status , Patient's Chart, lab work & pertinent test results  History of Anesthesia Complications (+) PONV and history of anesthetic complications  Airway Mallampati: II  TM Distance: >3 FB Neck ROM: Full    Dental  (+) Edentulous Upper, Edentulous Lower, Dental Advisory Given   Pulmonary former smoker   Pulmonary exam normal breath sounds clear to auscultation       Cardiovascular hypertension, Pt. on medications Normal cardiovascular exam Rhythm:Regular Rate:Normal     Neuro/Psych negative neurological ROS  negative psych ROS   GI/Hepatic negative GI ROS, Neg liver ROS,,,  Endo/Other  negative endocrine ROS    Renal/GU negative Renal ROS  negative genitourinary   Musculoskeletal  (+) Arthritis ,    Abdominal   Peds  Hematology negative hematology ROS (+)   Anesthesia Other Findings   Reproductive/Obstetrics                             Anesthesia Physical Anesthesia Plan  ASA: 2  Anesthesia Plan: General   Post-op Pain Management: Tylenol PO (pre-op)*   Induction: Intravenous  PONV Risk Score and Plan: 4 or greater and Ondansetron, Dexamethasone, Treatment may vary due to age or medical condition and TIVA  Airway Management Planned: LMA  Additional Equipment:   Intra-op Plan:   Post-operative Plan: Extubation in OR  Informed Consent: I have reviewed the patients History and Physical, chart, labs and discussed the procedure including the risks, benefits and alternatives for the proposed anesthesia with the patient or authorized representative who has indicated his/her understanding and acceptance.     Dental advisory given  Plan Discussed with: CRNA  Anesthesia Plan Comments:        Anesthesia Quick Evaluation

## 2023-11-04 NOTE — Anesthesia Procedure Notes (Signed)
 Procedure Name: LMA Insertion Date/Time: 11/04/2023 10:43 AM  Performed by: Julieanne Fairy BROCKS, CRNAPre-anesthesia Checklist: Patient identified, Emergency Drugs available, Suction available and Patient being monitored Patient Re-evaluated:Patient Re-evaluated prior to induction Oxygen Delivery Method: Circle system utilized Preoxygenation: Pre-oxygenation with 100% oxygen Induction Type: IV induction Ventilation: Mask ventilation without difficulty LMA: LMA inserted LMA Size: 4.0 Number of attempts: 1 Airway Equipment and Method: Bite block Placement Confirmation: positive ETCO2 Tube secured with: Tape Dental Injury: Teeth and Oropharynx as per pre-operative assessment

## 2023-11-04 NOTE — Transfer of Care (Signed)
 Immediate Anesthesia Transfer of Care Note  Patient: Kelly Dixon  Procedure(s) Performed: RE-EXCISION OF INFERIOR MARGIN-LEFT BREAST LUMPECTOMY (Left: Breast)  Patient Location: PACU  Anesthesia Type:General  Level of Consciousness: sedated  Airway & Oxygen Therapy: Patient Spontanous Breathing and Patient connected to face mask oxygen  Post-op Assessment: Report given to RN and Post -op Vital signs reviewed and stable  Post vital signs: Reviewed and stable  Last Vitals:  Vitals Value Taken Time  BP 103/68 11/04/23 1125  Temp    Pulse 76 11/04/23 1128  Resp 15 11/04/23 1128  SpO2 97 % 11/04/23 1128  Vitals shown include unfiled device data.  Last Pain:  Vitals:   11/04/23 0845  TempSrc: Temporal  PainSc: 0-No pain      Patients Stated Pain Goal: 3 (11/04/23 0845)  Complications: No notable events documented.

## 2023-11-05 ENCOUNTER — Other Ambulatory Visit: Payer: Self-pay | Admitting: Family Medicine

## 2023-11-05 ENCOUNTER — Encounter (HOSPITAL_BASED_OUTPATIENT_CLINIC_OR_DEPARTMENT_OTHER): Payer: Self-pay | Admitting: Surgery

## 2023-11-05 DIAGNOSIS — I10 Essential (primary) hypertension: Secondary | ICD-10-CM

## 2023-11-10 ENCOUNTER — Encounter: Payer: Self-pay | Admitting: *Deleted

## 2023-11-10 ENCOUNTER — Telehealth: Payer: Self-pay | Admitting: Radiation Oncology

## 2023-11-10 DIAGNOSIS — C50412 Malignant neoplasm of upper-outer quadrant of left female breast: Secondary | ICD-10-CM

## 2023-11-10 LAB — SURGICAL PATHOLOGY

## 2023-11-10 NOTE — Telephone Encounter (Signed)
 Left message for patient to call back to schedule consult per 2/10 referral.

## 2023-11-11 ENCOUNTER — Encounter: Payer: Self-pay | Admitting: *Deleted

## 2023-11-12 ENCOUNTER — Inpatient Hospital Stay: Payer: Medicare HMO | Attending: Hematology and Oncology | Admitting: Hematology and Oncology

## 2023-11-12 VITALS — BP 134/78 | HR 66 | Temp 97.9°F | Resp 18 | Wt 203.1 lb

## 2023-11-12 DIAGNOSIS — Z79811 Long term (current) use of aromatase inhibitors: Secondary | ICD-10-CM | POA: Insufficient documentation

## 2023-11-12 DIAGNOSIS — Z170 Estrogen receptor positive status [ER+]: Secondary | ICD-10-CM | POA: Insufficient documentation

## 2023-11-12 DIAGNOSIS — C50411 Malignant neoplasm of upper-outer quadrant of right female breast: Secondary | ICD-10-CM | POA: Insufficient documentation

## 2023-11-12 NOTE — Progress Notes (Signed)
Patient Care Team: Sharlene Dory, DO as PCP - General (Family Medicine) [name], MD as Consulting Physician (General Surgery) Serena Croissant, MD as Consulting Physician (Hematology and Oncology) Lonie Peak, MD as Attending Physician (Radiation Oncology) Donnelly Angelica, RN as Oncology Nurse Navigator Pershing Proud, RN as Oncology Nurse Navigator  DIAGNOSIS:  Encounter Diagnosis  Name Primary?   Malignant neoplasm of upper-outer quadrant of right breast in female, estrogen receptor positive (HCC) Yes    SUMMARY OF ONCOLOGIC HISTORY: Oncology History  Malignant neoplasm of upper-outer quadrant of right breast in female, estrogen receptor positive (HCC)  09/08/2019 Initial Diagnosis   Routine screening mammogram detected a 0.8cm mass at the 10 o'clock position in the right breast, no right axillary adenopathy. Biopsy showed IDC with DCIS, grade 2, HER-2 - (1+), ER+ 100%, PR 70%, Ki67 15%.    09/08/2019 Cancer Staging   Staging form: Breast, AJCC 8th Edition - Clinical: Stage IA (cT1b, cN0, cM0, G2, ER+, PR+, HER2-)    09/15/2019 Surgery   Right lumpectomy (Tsuei) ([id number]): IDC with DCIS, 0.9cm, clear margins, 3 right axillary lymph nodes negative.    09/15/2019 Cancer Staging   Staging form: Breast, AJCC 8th Edition - Pathologic stage from 09/15/2019: Stage IA (pT1b, pN0, cM0, G2, ER+, PR+, HER2-)    10/2019 - 09/2024 Anti-estrogen oral therapy   Anastrozole daily   Malignant neoplasm of upper-outer quadrant of left female breast (HCC)  10/07/2023 Initial Diagnosis   Malignant neoplasm of upper-outer quadrant of left female breast (HCC)   10/13/2023 Cancer Staging   Staging form: Breast, AJCC 8th Edition - Clinical stage from 10/13/2023: Stage IA (cT1b, cN0, cM0, G2, ER+, PR+, HER2-) - Signed by Lonie Peak, MD on 10/13/2023 Stage prefix: Initial diagnosis Histologic grading system: 3 grade system     CHIEF COMPLIANT: Follow-up after surgery to  discuss pathology report  HISTORY OF PRESENT ILLNESS:  History of Present Illness   Kelly Dixon is a 78 year old female with breast cancer who presents for a follow-up to discuss her pathology report post-surgery.  She underwent surgery for lobular breast cancer, which required a second procedure due to a suspicious inferior margin. The final pathology report confirmed precancerous in situ components, with a tumor size of 1.5 centimeters. The cancer was estrogen receptor positive, progesterone receptor positive, with a slow growth rate of 8%, and HER2 negative. The final stage was determined to be stage 1A.  She has a history of attempting anti-estrogen therapy, which resulted in adverse effects described as feeling 'cloudy in the head.' She discontinued the medication after three days due to these side effects. 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         ALLERGIES:  has no known allergies.  MEDICATIONS:  Current Outpatient Medications  Medication Sig Dispense Refill   acetaminophen (TYLENOL) 500 MG tablet Take 1,000 mg by mouth daily.     amLODipine (NORVASC) 5 MG tablet TAKE 1 TABLET(5 MG) BY MOUTH DAILY 90 tablet 1   cyanocobalamin 1000 MCG tablet Take 1 tablet (1,000 mcg total) by mouth daily. 30 tablet 0   ferrous sulfate 325 (65 FE) MG EC tablet Take 1 tablet (325 mg total) by mouth daily with breakfast. 30 tablet 3   fexofenadine (ALLEGRA) 180 MG tablet Take 180 mg by mouth in the morning.  ondansetron (ZOFRAN-ODT) 8 MG disintegrating tablet Take 1 tablet (8 mg total) by mouth every 8 (eight) hours as needed for nausea or vomiting. 20 tablet 0   Psyllium-Calcium (FIBER PLUS CALCIUM PO) Take 2 tablets by mouth in the morning. Chewables     No current facility-administered medications for this visit.    PHYSICAL EXAMINATION: ECOG PERFORMANCE STATUS: 1 -  Symptomatic but completely ambulatory  Vitals:   11/12/23 1150  BP: 134/78  Pulse: 66  Resp: 18  Temp: 97.9 F (36.6 C)  SpO2: 100%   Filed Weights   11/12/23 1150  Weight: 203 lb 1.6 oz (92.1 kg)    Physical Exam          (exam performed in the presence of a chaperone)  LABORATORY DATA:  I have reviewed the data as listed    Latest Ref Rng & Units 10/17/2023   11:30 AM 03/05/2023    2:00 PM 10/21/2022    2:39 PM  CMP  Glucose 70 - 99 mg/dL 161  096  045   BUN 8 - 23 mg/dL 12  17  16    Creatinine 0.44 - 1.00 mg/dL 4.09  8.11  9.14   Sodium 135 - 145 mmol/L 142  139  142   Potassium 3.5 - 5.1 mmol/L 4.6  3.7  4.3   Chloride 98 - 111 mmol/L 107  104  105   CO2 22 - 32 mmol/L 26  26  29    Calcium 8.9 - 10.3 mg/dL 9.3  8.8  9.2   Total Protein 6.5 - 8.1 g/dL  6.7  6.9   Total Bilirubin 0.3 - 1.2 mg/dL  0.4  0.4   Alkaline Phos 38 - 126 U/L  64  60   AST 15 - 41 U/L  17  11   ALT 0 - 44 U/L  16  6     Lab Results  Component Value Date   WBC 4.5 10/17/2023   HGB 13.0 10/17/2023   HCT 40.1 10/17/2023   MCV 95.0 10/17/2023   PLT 233 10/17/2023   NEUTROABS 3.6 09/30/2022    ASSESSMENT & PLAN:  Malignant neoplasm of upper-outer quadrant of right breast in female, estrogen receptor positive (HCC) 09/15/2019:Right lumpectomy (Tsuei): IDC with DCIS, 0.9cm, clear margins, 3 right axillary lymph nodes negative.  ER 100%, PR 70%, Ki-67 15%, HER-2 negative, T1BN0 stage Ia pathologic stage  Patient declined adjuvant radiation Prior treatment: Anastrozole 1 mg daily started 10/05/2019 -------------------------------------------------------------------------------------------------------------------------  Left breast 10/02/2023: Left breast 12:00 suspicious mass 0.6 cm biopsy: Grade 2 invasive mammary cancer with mammary carcinoma in situ ER 100%, PR 25%, Ki67 8%, HER2 0 negative  10/20/2023: Left lumpectomy: Grade 2 ILC with extensive LCIS, 1.5 cm, inferior margin positive, ER  100%, PR 25%, HER2 0, Ki-67 8% 11/04/2023: Left inferior margin resection: Benign  Treatment plan: XRT (with Dr. [name]) Adjuvant antiestrogen therapy with letrozole once daily x 7 years  Return to clinic after radiation is complete No orders of the defined types were placed in this encounter.  The patient has a good understanding of the overall plan. she agrees with it. she will call with any problems that may develop before the next visit here. Total time spent: 30 mins including face to face time and time spent for planning, charting and co-ordination of care   Tamsen Meek, MD 11/12/23

## 2023-11-12 NOTE — Assessment & Plan Note (Signed)
09/15/2019:Right lumpectomy (Tsuei): IDC with DCIS, 0.9cm, clear margins, 3 right axillary lymph nodes negative.  ER 100%, PR 70%, Ki-67 15%, HER-2 negative, T1BN0 stage Ia pathologic stage  Patient declined adjuvant radiation Prior treatment: Anastrozole 1 mg daily started 10/05/2019 -------------------------------------------------------------------------------------------------------------------------  Left breast 10/02/2023: Left breast 12:00 suspicious mass 0.6 cm biopsy: Grade 2 invasive mammary cancer with mammary carcinoma in situ ER 100%, PR 25%, Ki67 8%, HER2 0 negative  10/20/2023: Left lumpectomy: Grade 2 ILC with extensive LCIS, 1.5 cm, inferior margin positive, ER 100%, PR 25%, HER2 0, Ki-67 8% 11/04/2023: Left inferior margin resection: Benign  Treatment plan: XRT (with Dr. [name]) Adjuvant antiestrogen therapy with letrozole once daily x 5 years  Return to clinic after radiation is complete

## 2023-11-19 NOTE — Progress Notes (Addendum)
 Location of Breast Cancer: Malignant Neoplasm of Upper-Outer Quadrant of Left Breast, Unspecified Estrogen Receptor Status  Histology per Pathology Report:    Receptor Status: ER(Positive), PR (Positive), Her2-neu (Negative), Ki-67(8%)  Did patient present with symptoms (if so, please note symptoms) or was this found on screening mammography?:  Mammogram    Mammogram  Past/Anticipated interventions by surgeon, if any:  Tsuei MD 10/20/2023 Left Breast Lumpectomy with Radioactive Seed Localization   Past/Anticipated interventions by medical oncology, if any:   [name], MD 11/12/2023 Current Treatment plan: Radiation Therapy (with Dr. [name]) Adjuvant antiestrogen therapy with letrozole once daily x 7 years   Lymphedema issues, if any: Denies   Pain issues, if any:  Denies  SAFETY ISSUES: Prior radiation? No Pacemaker/ICD? No Possible current pregnancy? N/A Is the patient on methotrexate? No  Current Complaints / other details:  She wants to know the times of the treatments and the cost.  BP (!) 147/91 (BP Location: Right Arm, Patient Position: Sitting)   Pulse 62   Temp (!) 97.2 F (36.2 C)   Resp 18   Ht 5\' 2"  (1.575 m)   Wt 204 lb 12.8 oz (92.9 kg)   SpO2 97%   BMI 37.46 kg/m

## 2023-12-01 DIAGNOSIS — Z170 Estrogen receptor positive status [ER+]: Secondary | ICD-10-CM | POA: Diagnosis not present

## 2023-12-01 DIAGNOSIS — C50412 Malignant neoplasm of upper-outer quadrant of left female breast: Secondary | ICD-10-CM | POA: Diagnosis not present

## 2023-12-01 NOTE — Progress Notes (Signed)
 Radiation Oncology         [phone] ________________________________  Name: Kelly Dixon MRN: [id number]  Date: 12/02/2023  DOB: [birth year]  Follow-Up Visit Note  Outpatient  CC: Sharlene Dory, DO  Serena Croissant, MD  Diagnosis:   No diagnosis found.  Malignant neoplasm of upper-outer quadrant of right breast in female, estrogen receptor positive (HCC) Stage IA (pT1b, pN0, cM0, G2, ER+, PR+, HER2-)  Malignant neoplasm of upper-outer quadrant of right breast in female, estrogen receptor positive (HCC) Stage IA (pT1b, pN0, cM0, G2, ER+, PR+, HER2-)   CHIEF COMPLAINT: Here to discuss management of left breast cancer  Narrative:  The patient returns today for follow-up. She was lat seen in office on 10-13-23 for her initial consultation.    Since consultation date, she underwent CT abdomen/pelvis on 10-28-23 which showed: a moderate-size esophageal hiatal hernia, a nonobstructing 3 mm left kidney stone, and a fluid-filled small bowel with mild small bowel wall thickening and mild mesenteric edema likely indicating enteritis.    Since consultation date, she underwent a left breast lumpectomy with radioactive seed localization on 10-20-23 under the care of Dr. Suzanna Obey.   Breast surgery on date of 10-20-23 revealed: tumor size of 1.5 cm in the greatest extent; histology of extensive lobular carcinoma in situ showing extensive ductal involvement with microcalcifications present within benign ducts and LCIS; margin status to invasive disease of 1 mm from posterior margin and 1.9 mm from anterior superior margin; nodal status not examined;  ER status: positive with 100%, strong staining; PR status positive with 25%, moderate staining, Her2 status negative; Grade 2. Ki-67 8%   She presented for a re-excision of inferior-margin left breast lumpectomy on 11-04-23 under the care of Dr. Corliss Skains due to a suspicious/positive inferior margin. Surgical pathology of left inferior margin  indicated benign breast tissue with biopsy site changes and rare atypical cells. Rare atypical cells less than 0.1 mm from the green ink/anterior margin. Immunohistochemical stains for CK7 and E-cadherin were performed on the specimen to further rule out the presence of malignancy with all negative for evidence of lobular carcinoma.   During, most recent follow up with Dr. Pamelia Hoit on  11-12-23, she reported recovering from surgery well without any reported concerns. Current adjuvant therapy consist of antiestrogen therapy with letrozole once daily x 5 years. She was previously on anti-estrogen therapy, which resulted in adverse effects described as feeling 'cloudy in the head.' As a result, she discontinued the medication after three days.  Symptomatically, the patient reports: ***        ALLERGIES:  has no known allergies.  Meds: Current Outpatient Medications  Medication Sig Dispense Refill   acetaminophen (TYLENOL) 500 MG tablet Take 1,000 mg by mouth daily.     amLODipine (NORVASC) 5 MG tablet TAKE 1 TABLET(5 MG) BY MOUTH DAILY 90 tablet 1   cyanocobalamin 1000 MCG tablet Take 1 tablet (1,000 mcg total) by mouth daily. 30 tablet 0   ferrous sulfate 325 (65 FE) MG EC tablet Take 1 tablet (325 mg total) by mouth daily with breakfast. 30 tablet 3   fexofenadine (ALLEGRA) 180 MG tablet Take 180 mg by mouth in the morning.     ondansetron (ZOFRAN-ODT) 8 MG disintegrating tablet Take 1 tablet (8 mg total) by mouth every 8 (eight) hours as needed for nausea or vomiting. 20 tablet 0   Psyllium-Calcium (FIBER PLUS CALCIUM PO) Take 2 tablets by mouth in the morning. Chewables     No current  facility-administered medications for this encounter.    Physical Findings:  vitals were not taken for this visit. .     General: Alert and oriented, in no acute distress HEENT: Head is normocephalic. Extraocular movements are intact. Oropharynx is clear. Neck: Neck is supple, no palpable cervical or  supraclavicular lymphadenopathy. Heart: Regular in rate and rhythm with no murmurs, rubs, or gallops. Chest: Clear to auscultation bilaterally, with no rhonchi, wheezes, or rales. Abdomen: Soft, nontender, nondistended, with no rigidity or guarding. Extremities: No cyanosis or edema. Lymphatics: see Neck Exam Musculoskeletal: symmetric strength and muscle tone throughout. Neurologic: No obvious focalities. Speech is fluent.  Psychiatric: Judgment and insight are intact. Affect is appropriate. Breast exam reveals ***  Lab Findings: Lab Results  Component Value Date   WBC 4.5 10/17/2023   HGB 13.0 10/17/2023   HCT 40.1 10/17/2023   MCV 95.0 10/17/2023   PLT 233 10/17/2023    @LASTCHEMISTRY @  Radiographic Findings: No results found.  Impression/Plan: We discussed adjuvant radiotherapy today.  I recommend *** in order to ***.  I reviewed the logistics, benefits, risks, and potential side effects of this treatment in detail. Risks may include but not necessary be limited to acute and late injury tissue in the radiation fields such as skin irritation (change in color/pigmentation, itching, dryness, pain, peeling). She may experience fatigue. We also discussed possible risk of long term cosmetic changes or scar tissue. There is also a smaller risk for lung toxicity, ***cardiac toxicity, ***brachial plexopathy, ***lymphedema, ***musculoskeletal changes, ***rib fragility or ***induction of a second malignancy, ***late chronic non-healing soft tissue wound.    The patient asked good questions which I answered to her satisfaction. She is enthusiastic about proceeding with treatment. A consent form has been *** signed and placed in her chart.  A total of *** medically necessary complex treatment devices will be fabricated and supervised by me: *** fields with MLCs for custom blocks to protect heart, and lungs;  and, a Vac-lok. MORE COMPLEX DEVICES MAY BE MADE IN DOSIMETRY FOR FIELD IN FIELD BEAMS  FOR DOSE HOMOGENEITY.  I have requested : 3D Simulation which is medically necessary to give adequate dose to at risk tissues while sparing lungs and heart.  I have requested a DVH of the following structures: lungs, heart, *** lumpectomy cavity.    The patient will receive *** Gy in *** fractions to the *** with *** fields.  This will be *** followed by a boost.  On date of service, in total, I spent *** minutes on this encounter. Patient was seen in person.  _____________________________________   Lonie Peak, MD   This document serves as a record of services personally performed by Lonie Peak, MD. It was created on her behalf by Herbie Saxon, a trained medical scribe. The creation of this record is based on the scribe's personal observations and the provider's statements to them. This document has been checked and approved by the attending provider.

## 2023-12-02 ENCOUNTER — Ambulatory Visit
Admission: RE | Admit: 2023-12-02 | Discharge: 2023-12-02 | Disposition: A | Discharge status: Home / Self Care | Payer: Medicare HMO | Source: Ambulatory Visit | Attending: Radiation Oncology | Admitting: Radiation Oncology

## 2023-12-02 ENCOUNTER — Inpatient Hospital Stay: Admitting: Licensed Clinical Social Worker

## 2023-12-02 ENCOUNTER — Encounter: Payer: Self-pay | Admitting: Radiation Oncology

## 2023-12-02 VITALS — BP 147/91 | HR 62 | Temp 97.2°F | Resp 18 | Ht 62.0 in | Wt 204.8 lb

## 2023-12-02 DIAGNOSIS — Z79811 Long term (current) use of aromatase inhibitors: Secondary | ICD-10-CM | POA: Insufficient documentation

## 2023-12-02 DIAGNOSIS — Z170 Estrogen receptor positive status [ER+]: Secondary | ICD-10-CM | POA: Insufficient documentation

## 2023-12-02 DIAGNOSIS — C50412 Malignant neoplasm of upper-outer quadrant of left female breast: Secondary | ICD-10-CM | POA: Insufficient documentation

## 2023-12-02 DIAGNOSIS — Z79899 Other long term (current) drug therapy: Secondary | ICD-10-CM | POA: Diagnosis not present

## 2023-12-02 DIAGNOSIS — C50411 Malignant neoplasm of upper-outer quadrant of right female breast: Secondary | ICD-10-CM | POA: Insufficient documentation

## 2023-12-02 DIAGNOSIS — Z510 Encounter for antineoplastic radiation therapy: Secondary | ICD-10-CM | POA: Diagnosis not present

## 2023-12-02 DIAGNOSIS — Z923 Personal history of irradiation: Secondary | ICD-10-CM | POA: Diagnosis not present

## 2023-12-02 NOTE — Progress Notes (Signed)
 [facility] Clinical Social Work  Initial Assessment   Kelly Dixon is a 78 y.o. year old female contacted by phone. Clinical Social Work was referred by medical provider for assessment of psychosocial needs.   SDOH (Social Determinants of Health) assessments performed: Yes SDOH Interventions    Flowsheet Row Clinical Support from 12/02/2023 in [facility] - A Dept Of [place] Clinical Support from 02/13/2023 in [facility] at [facility] Telephone from 10/02/2022 in [facility] Community Care Coordination Clinical Support from 02/11/2022 in [facility] Care at [facility] Clinical Support from 01/18/2021 in [facility] at [facility]  SDOH Interventions       Food Insecurity Interventions -- Intervention Not Indicated Intervention Not Indicated Intervention Not Indicated --  Housing Interventions -- Intervention Not Indicated Intervention Not Indicated Intervention Not Indicated --  Transportation Interventions -- Intervention Not Indicated Intervention Not Indicated Intervention Not Indicated --  Utilities Interventions -- Intervention Not Indicated -- -- --  Alcohol Usage Interventions -- Intervention Not Indicated (Score <7) -- -- --  Financial Strain Interventions Other (Comment)  [cancer foundations] Intervention Not Indicated -- Intervention Not Indicated --  Physical Activity Interventions -- Intervention Not Indicated -- Intervention Not Indicated Intervention Not Indicated  [pt has set a goal to increase activity]  Stress Interventions -- Intervention Not Indicated -- Intervention Not Indicated --  Social Connections Interventions -- Intervention Not Indicated -- Intervention Not Indicated --       SDOH Screenings   Food Insecurity: No Food Insecurity (12/02/2023)  Housing: Low Risk  (12/02/2023)  Transportation Needs: No Transportation Needs (12/02/2023)   Utilities: Not At Risk (12/02/2023)  Alcohol Screen: Low Risk  (02/10/2023)  Depression (PHQ2-9): Low Risk  (12/02/2023)  Financial Resource Strain: Medium Risk (12/02/2023)  Physical Activity: Sufficiently Active (10/28/2023)  Social Connections: Moderately Integrated (10/28/2023)  Stress: No Stress Concern Present (10/28/2023)  Tobacco Use: Medium Risk (12/01/2023)   Received from [facility]     Distress Screen completed: No     No data to display            Family/Social Information:  Housing Arrangement: patient lives with her niece . Pt is not responsible for rent/utilities Family members/support persons in your life? 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 yes, had surgery, radiation is starting in 2 weeks Concerns about diagnosis and/or treatment: How I will pay for the services I need Patient reported stressors: Finances Current coping skills/ strengths: Manufacturing systems engineer , Motivation for treatment/growth , and Supportive family/friends     SUMMARY: Current SDOH Barriers:  Financial constraints related to loss of work due to cancer & treatment  Clinical Social Work Clinical Goal(s):  Explore community resource options for unmet needs related to:  Financial Strain   Interventions: Informed patient of the support team roles and support services at [facility] Provided CSW contact information and encouraged patient to call with any questions or  concerns Provided patient  with information about [employer] and [employer] (pt gathering documents for [employer] application and will do [employer]) CSW completed application for [employer] on pt's behalf   Follow Up Plan: Patient will do [employer] [place] application online and will send car loan statement to CSW for [employer] application Patient verbalizes understanding of plan: Yes    Rainer Mounce E Estephani Popper, LCSW Clinical Social Worker [facility]

## 2023-12-04 ENCOUNTER — Encounter: Payer: Self-pay | Admitting: Licensed Clinical Social Worker

## 2023-12-04 NOTE — Progress Notes (Signed)
 [facility] CSW Progress Note  Clinical Child psychotherapist received bill from patient to submit with [employer] application. Application completed and submitted today. Patient informed.    Kelly Goga E Antionio Negron, LCSW Clinical Social Worker [employer]

## 2023-12-05 DIAGNOSIS — Z170 Estrogen receptor positive status [ER+]: Secondary | ICD-10-CM | POA: Diagnosis not present

## 2023-12-05 DIAGNOSIS — C50412 Malignant neoplasm of upper-outer quadrant of left female breast: Secondary | ICD-10-CM | POA: Diagnosis not present

## 2023-12-05 DIAGNOSIS — Z79811 Long term (current) use of aromatase inhibitors: Secondary | ICD-10-CM | POA: Diagnosis not present

## 2023-12-05 DIAGNOSIS — C50411 Malignant neoplasm of upper-outer quadrant of right female breast: Secondary | ICD-10-CM | POA: Diagnosis not present

## 2023-12-05 DIAGNOSIS — Z510 Encounter for antineoplastic radiation therapy: Secondary | ICD-10-CM | POA: Diagnosis not present

## 2023-12-08 ENCOUNTER — Encounter: Payer: Self-pay | Admitting: Licensed Clinical Social Worker

## 2023-12-08 ENCOUNTER — Encounter: Payer: Self-pay | Admitting: *Deleted

## 2023-12-08 DIAGNOSIS — C50411 Malignant neoplasm of upper-outer quadrant of right female breast: Secondary | ICD-10-CM

## 2023-12-08 NOTE — Progress Notes (Signed)
 [facility] Clinical Social Work  CSW received request from [employer] for additional information for pt's application. CSW worked with pt to obtain updated statement and submitted to [employer].  Pt also informed CSW that she was unable to complete online application for [employer]. CSW mailed paper application to pt with required information highlighted.   Milbert Bixler E Myiah Petkus, LCSW

## 2023-12-15 ENCOUNTER — Other Ambulatory Visit (INDEPENDENT_AMBULATORY_CARE_PROVIDER_SITE_OTHER): Payer: Self-pay

## 2023-12-15 ENCOUNTER — Encounter: Payer: Self-pay | Admitting: Orthopaedic Surgery

## 2023-12-15 ENCOUNTER — Other Ambulatory Visit: Payer: Self-pay

## 2023-12-15 ENCOUNTER — Encounter: Payer: Self-pay | Admitting: Licensed Clinical Social Worker

## 2023-12-15 ENCOUNTER — Ambulatory Visit (INDEPENDENT_AMBULATORY_CARE_PROVIDER_SITE_OTHER): Payer: Medicare HMO | Admitting: Orthopaedic Surgery

## 2023-12-15 ENCOUNTER — Ambulatory Visit

## 2023-12-15 ENCOUNTER — Ambulatory Visit
Admission: RE | Admit: 2023-12-15 | Discharge: 2023-12-15 | Disposition: A | Discharge status: Home / Self Care | Source: Ambulatory Visit | Attending: Radiation Oncology | Admitting: Radiation Oncology

## 2023-12-15 VITALS — Ht 62.0 in | Wt 211.0 lb

## 2023-12-15 DIAGNOSIS — M1711 Unilateral primary osteoarthritis, right knee: Secondary | ICD-10-CM | POA: Diagnosis not present

## 2023-12-15 DIAGNOSIS — Z79811 Long term (current) use of aromatase inhibitors: Secondary | ICD-10-CM | POA: Diagnosis not present

## 2023-12-15 DIAGNOSIS — Z170 Estrogen receptor positive status [ER+]: Secondary | ICD-10-CM | POA: Diagnosis not present

## 2023-12-15 DIAGNOSIS — Z510 Encounter for antineoplastic radiation therapy: Secondary | ICD-10-CM | POA: Diagnosis not present

## 2023-12-15 DIAGNOSIS — C50412 Malignant neoplasm of upper-outer quadrant of left female breast: Secondary | ICD-10-CM | POA: Diagnosis not present

## 2023-12-15 DIAGNOSIS — C50411 Malignant neoplasm of upper-outer quadrant of right female breast: Secondary | ICD-10-CM | POA: Diagnosis not present

## 2023-12-15 LAB — RAD ONC ARIA SESSION SUMMARY
Course Elapsed Days: 0
Plan Fractions Treated to Date: 1
Plan Prescribed Dose Per Fraction: 2.67 Gy
Plan Total Fractions Prescribed: 15
Plan Total Prescribed Dose: 40.05 Gy
Reference Point Dosage Given to Date: 2.67 Gy
Reference Point Session Dosage Given: 2.67 Gy
Session Number: 1

## 2023-12-15 NOTE — Progress Notes (Signed)
 [facility] CSW Progress Note  Clinical Child psychotherapist contacted patient by phone to notify her of grant assistance through [employer] who will assist with car loan and gas card. Pt expressed understanding and gratitude. No questions at this time.    Nelida Mandarino E Hillard Goodwine, LCSW Clinical Social Worker [facility] [facility]

## 2023-12-15 NOTE — Progress Notes (Signed)
 The patient is well-known to Kelly Dixon.  We have replaced her left knee due to severe and significant arthritis of the left knee.  At this point her right knee has been significantly painful.  At 1 point last year we were going to replace her right knee but that needed to be canceled.  She is still dealing with some breast cancer issues.  She is starting radiation for that.  She wants to consider her right knee to be replaced in June of this year.  When she walks she has significant varus malalignment of her right knee and the left knee is nice and straight.  This knee was replaced.  Her right knee has significant medial joint line tenderness and patellofemoral crepitation throughout the arc of motion of the knee.  The varus malalignment is not correctable.  X-rays of the right knee show severe end-stage arthritis of the right knee.  Of note we have tried steroid injections and hyaluronic acid injections for the knee.  Her BMI today is 38.59.  Her x-rays of that knee show osteophytes in all 3 compartments that are large with significant varus malalignment.  She is interested in having her right knee replaced in June of this year.  We will work on getting her on the schedule for a right total knee arthroplasty done.  Having had this before she is fully aware of the risks and benefits of surgery and what to expect from an intraoperative and postoperative standpoint.

## 2023-12-16 ENCOUNTER — Ambulatory Visit
Admission: RE | Admit: 2023-12-16 | Discharge: 2023-12-16 | Disposition: A | Discharge status: Home / Self Care | Source: Ambulatory Visit | Attending: Radiation Oncology | Admitting: Radiation Oncology

## 2023-12-16 ENCOUNTER — Other Ambulatory Visit: Payer: Self-pay

## 2023-12-16 DIAGNOSIS — C50412 Malignant neoplasm of upper-outer quadrant of left female breast: Secondary | ICD-10-CM | POA: Diagnosis not present

## 2023-12-16 DIAGNOSIS — Z510 Encounter for antineoplastic radiation therapy: Secondary | ICD-10-CM | POA: Diagnosis not present

## 2023-12-16 DIAGNOSIS — Z170 Estrogen receptor positive status [ER+]: Secondary | ICD-10-CM | POA: Diagnosis not present

## 2023-12-16 DIAGNOSIS — Z79811 Long term (current) use of aromatase inhibitors: Secondary | ICD-10-CM | POA: Diagnosis not present

## 2023-12-16 DIAGNOSIS — C50411 Malignant neoplasm of upper-outer quadrant of right female breast: Secondary | ICD-10-CM | POA: Diagnosis not present

## 2023-12-16 LAB — RAD ONC ARIA SESSION SUMMARY
Course Elapsed Days: 1
Plan Fractions Treated to Date: 2
Plan Prescribed Dose Per Fraction: 2.67 Gy
Plan Total Fractions Prescribed: 15
Plan Total Prescribed Dose: 40.05 Gy
Reference Point Dosage Given to Date: 5.34 Gy
Reference Point Session Dosage Given: 2.67 Gy
Session Number: 2

## 2023-12-17 ENCOUNTER — Ambulatory Visit: Admission: RE | Admit: 2023-12-17 | Source: Ambulatory Visit

## 2023-12-18 ENCOUNTER — Ambulatory Visit
Admission: RE | Admit: 2023-12-18 | Discharge: 2023-12-18 | Disposition: A | Discharge status: Home / Self Care | Source: Ambulatory Visit | Attending: Radiation Oncology | Admitting: Radiation Oncology

## 2023-12-18 ENCOUNTER — Other Ambulatory Visit: Payer: Self-pay

## 2023-12-18 DIAGNOSIS — C50412 Malignant neoplasm of upper-outer quadrant of left female breast: Secondary | ICD-10-CM | POA: Diagnosis not present

## 2023-12-18 DIAGNOSIS — Z79811 Long term (current) use of aromatase inhibitors: Secondary | ICD-10-CM | POA: Diagnosis not present

## 2023-12-18 DIAGNOSIS — Z170 Estrogen receptor positive status [ER+]: Secondary | ICD-10-CM | POA: Diagnosis not present

## 2023-12-18 DIAGNOSIS — Z510 Encounter for antineoplastic radiation therapy: Secondary | ICD-10-CM | POA: Diagnosis not present

## 2023-12-18 DIAGNOSIS — C50411 Malignant neoplasm of upper-outer quadrant of right female breast: Secondary | ICD-10-CM | POA: Diagnosis not present

## 2023-12-18 LAB — RAD ONC ARIA SESSION SUMMARY
Course Elapsed Days: 3
Plan Fractions Treated to Date: 3
Plan Prescribed Dose Per Fraction: 2.67 Gy
Plan Total Fractions Prescribed: 15
Plan Total Prescribed Dose: 40.05 Gy
Reference Point Dosage Given to Date: 8.01 Gy
Reference Point Session Dosage Given: 2.67 Gy
Session Number: 3

## 2023-12-19 ENCOUNTER — Other Ambulatory Visit: Payer: Self-pay

## 2023-12-19 ENCOUNTER — Ambulatory Visit
Admission: RE | Admit: 2023-12-19 | Discharge: 2023-12-19 | Disposition: A | Discharge status: Home / Self Care | Source: Ambulatory Visit | Attending: Radiation Oncology | Admitting: Radiation Oncology

## 2023-12-19 DIAGNOSIS — C50412 Malignant neoplasm of upper-outer quadrant of left female breast: Secondary | ICD-10-CM | POA: Diagnosis not present

## 2023-12-19 DIAGNOSIS — Z79811 Long term (current) use of aromatase inhibitors: Secondary | ICD-10-CM | POA: Diagnosis not present

## 2023-12-19 DIAGNOSIS — Z510 Encounter for antineoplastic radiation therapy: Secondary | ICD-10-CM | POA: Diagnosis not present

## 2023-12-19 DIAGNOSIS — Z170 Estrogen receptor positive status [ER+]: Secondary | ICD-10-CM | POA: Diagnosis not present

## 2023-12-19 DIAGNOSIS — C50411 Malignant neoplasm of upper-outer quadrant of right female breast: Secondary | ICD-10-CM | POA: Diagnosis not present

## 2023-12-19 LAB — RAD ONC ARIA SESSION SUMMARY
Course Elapsed Days: 4
Plan Fractions Treated to Date: 4
Plan Prescribed Dose Per Fraction: 2.67 Gy
Plan Total Fractions Prescribed: 15
Plan Total Prescribed Dose: 40.05 Gy
Reference Point Dosage Given to Date: 10.68 Gy
Reference Point Session Dosage Given: 2.67 Gy
Session Number: 4

## 2023-12-22 ENCOUNTER — Ambulatory Visit

## 2023-12-22 ENCOUNTER — Ambulatory Visit
Admission: RE | Admit: 2023-12-22 | Discharge: 2023-12-22 | Disposition: A | Discharge status: Home / Self Care | Source: Ambulatory Visit | Attending: Radiation Oncology | Admitting: Radiation Oncology

## 2023-12-22 ENCOUNTER — Encounter: Payer: Self-pay | Admitting: Licensed Clinical Social Worker

## 2023-12-22 ENCOUNTER — Other Ambulatory Visit: Payer: Self-pay

## 2023-12-22 DIAGNOSIS — C50412 Malignant neoplasm of upper-outer quadrant of left female breast: Secondary | ICD-10-CM | POA: Diagnosis not present

## 2023-12-22 DIAGNOSIS — Z510 Encounter for antineoplastic radiation therapy: Secondary | ICD-10-CM | POA: Diagnosis not present

## 2023-12-22 DIAGNOSIS — Z79811 Long term (current) use of aromatase inhibitors: Secondary | ICD-10-CM | POA: Diagnosis not present

## 2023-12-22 DIAGNOSIS — Z170 Estrogen receptor positive status [ER+]: Secondary | ICD-10-CM | POA: Diagnosis not present

## 2023-12-22 DIAGNOSIS — C50411 Malignant neoplasm of upper-outer quadrant of right female breast: Secondary | ICD-10-CM | POA: Diagnosis not present

## 2023-12-22 LAB — RAD ONC ARIA SESSION SUMMARY
Course Elapsed Days: 7
Plan Fractions Treated to Date: 5
Plan Prescribed Dose Per Fraction: 2.67 Gy
Plan Total Fractions Prescribed: 15
Plan Total Prescribed Dose: 40.05 Gy
Reference Point Dosage Given to Date: 13.35 Gy
Reference Point Session Dosage Given: 2.67 Gy
Session Number: 5

## 2023-12-22 NOTE — Progress Notes (Signed)
 [facility] CSW Progress Note  Clinical Child psychotherapist notified pt that she was approved for assistance through [employer] and payment is in progress.    Refugio Mcconico E Kamerin Grumbine, LCSW Clinical Social Worker [facility]

## 2023-12-23 ENCOUNTER — Ambulatory Visit
Admission: RE | Admit: 2023-12-23 | Discharge: 2023-12-23 | Disposition: A | Discharge status: Home / Self Care | Source: Ambulatory Visit | Attending: Radiation Oncology | Admitting: Radiation Oncology

## 2023-12-23 ENCOUNTER — Other Ambulatory Visit: Payer: Self-pay

## 2023-12-23 DIAGNOSIS — C50411 Malignant neoplasm of upper-outer quadrant of right female breast: Secondary | ICD-10-CM | POA: Diagnosis not present

## 2023-12-23 DIAGNOSIS — Z79811 Long term (current) use of aromatase inhibitors: Secondary | ICD-10-CM | POA: Diagnosis not present

## 2023-12-23 DIAGNOSIS — C50412 Malignant neoplasm of upper-outer quadrant of left female breast: Secondary | ICD-10-CM | POA: Diagnosis not present

## 2023-12-23 DIAGNOSIS — Z170 Estrogen receptor positive status [ER+]: Secondary | ICD-10-CM | POA: Diagnosis not present

## 2023-12-23 DIAGNOSIS — Z510 Encounter for antineoplastic radiation therapy: Secondary | ICD-10-CM | POA: Diagnosis not present

## 2023-12-23 LAB — RAD ONC ARIA SESSION SUMMARY
Course Elapsed Days: 8
Plan Fractions Treated to Date: 6
Plan Prescribed Dose Per Fraction: 2.67 Gy
Plan Total Fractions Prescribed: 15
Plan Total Prescribed Dose: 40.05 Gy
Reference Point Dosage Given to Date: 16.02 Gy
Reference Point Session Dosage Given: 2.67 Gy
Session Number: 6

## 2023-12-24 ENCOUNTER — Ambulatory Visit
Admission: RE | Admit: 2023-12-24 | Discharge: 2023-12-24 | Disposition: A | Discharge status: Home / Self Care | Source: Ambulatory Visit | Attending: Radiation Oncology | Admitting: Radiation Oncology

## 2023-12-24 ENCOUNTER — Inpatient Hospital Stay: Admitting: Licensed Clinical Social Worker

## 2023-12-24 ENCOUNTER — Other Ambulatory Visit: Payer: Self-pay

## 2023-12-24 DIAGNOSIS — Z170 Estrogen receptor positive status [ER+]: Secondary | ICD-10-CM | POA: Diagnosis not present

## 2023-12-24 DIAGNOSIS — Z510 Encounter for antineoplastic radiation therapy: Secondary | ICD-10-CM | POA: Diagnosis not present

## 2023-12-24 DIAGNOSIS — Z79811 Long term (current) use of aromatase inhibitors: Secondary | ICD-10-CM | POA: Diagnosis not present

## 2023-12-24 DIAGNOSIS — C50412 Malignant neoplasm of upper-outer quadrant of left female breast: Secondary | ICD-10-CM | POA: Diagnosis not present

## 2023-12-24 DIAGNOSIS — C50411 Malignant neoplasm of upper-outer quadrant of right female breast: Secondary | ICD-10-CM | POA: Diagnosis not present

## 2023-12-24 LAB — RAD ONC ARIA SESSION SUMMARY
Course Elapsed Days: 9
Plan Fractions Treated to Date: 7
Plan Prescribed Dose Per Fraction: 2.67 Gy
Plan Total Fractions Prescribed: 15
Plan Total Prescribed Dose: 40.05 Gy
Reference Point Dosage Given to Date: 18.69 Gy
Reference Point Session Dosage Given: 2.67 Gy
Session Number: 7

## 2023-12-24 NOTE — Progress Notes (Signed)
 [facility] CSW Progress Note  Patient brought in her documents for the [employer] [place] application.  CSW finalized and submitted application today.  [employer] [place] will notify patient and CSW of decision regarding assistance.    [name], LCSW Clinical Social Worker [facility]

## 2023-12-25 ENCOUNTER — Other Ambulatory Visit: Payer: Self-pay

## 2023-12-25 ENCOUNTER — Ambulatory Visit
Admission: RE | Admit: 2023-12-25 | Discharge: 2023-12-25 | Disposition: A | Discharge status: Home / Self Care | Source: Ambulatory Visit | Attending: Radiation Oncology

## 2023-12-25 DIAGNOSIS — C50412 Malignant neoplasm of upper-outer quadrant of left female breast: Secondary | ICD-10-CM | POA: Diagnosis not present

## 2023-12-25 DIAGNOSIS — Z79811 Long term (current) use of aromatase inhibitors: Secondary | ICD-10-CM | POA: Diagnosis not present

## 2023-12-25 DIAGNOSIS — Z170 Estrogen receptor positive status [ER+]: Secondary | ICD-10-CM | POA: Diagnosis not present

## 2023-12-25 DIAGNOSIS — C50411 Malignant neoplasm of upper-outer quadrant of right female breast: Secondary | ICD-10-CM | POA: Diagnosis not present

## 2023-12-25 DIAGNOSIS — Z510 Encounter for antineoplastic radiation therapy: Secondary | ICD-10-CM | POA: Diagnosis not present

## 2023-12-25 LAB — RAD ONC ARIA SESSION SUMMARY
Course Elapsed Days: 10
Plan Fractions Treated to Date: 8
Plan Prescribed Dose Per Fraction: 2.67 Gy
Plan Total Fractions Prescribed: 15
Plan Total Prescribed Dose: 40.05 Gy
Reference Point Dosage Given to Date: 21.36 Gy
Reference Point Session Dosage Given: 2.67 Gy
Session Number: 8

## 2023-12-26 ENCOUNTER — Ambulatory Visit
Admission: RE | Admit: 2023-12-26 | Discharge: 2023-12-26 | Disposition: A | Discharge status: Home / Self Care | Source: Ambulatory Visit | Attending: Radiation Oncology | Admitting: Radiation Oncology

## 2023-12-26 ENCOUNTER — Other Ambulatory Visit: Payer: Self-pay

## 2023-12-26 DIAGNOSIS — C50412 Malignant neoplasm of upper-outer quadrant of left female breast: Secondary | ICD-10-CM | POA: Diagnosis not present

## 2023-12-26 DIAGNOSIS — C50411 Malignant neoplasm of upper-outer quadrant of right female breast: Secondary | ICD-10-CM | POA: Diagnosis not present

## 2023-12-26 DIAGNOSIS — Z170 Estrogen receptor positive status [ER+]: Secondary | ICD-10-CM | POA: Diagnosis not present

## 2023-12-26 DIAGNOSIS — Z510 Encounter for antineoplastic radiation therapy: Secondary | ICD-10-CM | POA: Diagnosis not present

## 2023-12-26 DIAGNOSIS — Z79811 Long term (current) use of aromatase inhibitors: Secondary | ICD-10-CM | POA: Diagnosis not present

## 2023-12-26 LAB — RAD ONC ARIA SESSION SUMMARY
Course Elapsed Days: 11
Plan Fractions Treated to Date: 9
Plan Prescribed Dose Per Fraction: 2.67 Gy
Plan Total Fractions Prescribed: 15
Plan Total Prescribed Dose: 40.05 Gy
Reference Point Dosage Given to Date: 24.03 Gy
Reference Point Session Dosage Given: 2.67 Gy
Session Number: 9

## 2023-12-29 ENCOUNTER — Inpatient Hospital Stay (HOSPITAL_BASED_OUTPATIENT_CLINIC_OR_DEPARTMENT_OTHER): Admitting: Hematology and Oncology

## 2023-12-29 ENCOUNTER — Ambulatory Visit
Admission: RE | Admit: 2023-12-29 | Discharge: 2023-12-29 | Disposition: A | Discharge status: Home / Self Care | Source: Ambulatory Visit | Attending: Radiation Oncology | Admitting: Radiation Oncology

## 2023-12-29 ENCOUNTER — Other Ambulatory Visit: Payer: Self-pay

## 2023-12-29 VITALS — BP 126/63 | HR 62 | Temp 98.3°F | Resp 18 | Ht 62.0 in | Wt 206.5 lb

## 2023-12-29 DIAGNOSIS — Z510 Encounter for antineoplastic radiation therapy: Secondary | ICD-10-CM | POA: Diagnosis not present

## 2023-12-29 DIAGNOSIS — C50412 Malignant neoplasm of upper-outer quadrant of left female breast: Secondary | ICD-10-CM | POA: Diagnosis not present

## 2023-12-29 DIAGNOSIS — Z79811 Long term (current) use of aromatase inhibitors: Secondary | ICD-10-CM | POA: Diagnosis not present

## 2023-12-29 DIAGNOSIS — C50411 Malignant neoplasm of upper-outer quadrant of right female breast: Secondary | ICD-10-CM | POA: Diagnosis not present

## 2023-12-29 DIAGNOSIS — Z170 Estrogen receptor positive status [ER+]: Secondary | ICD-10-CM

## 2023-12-29 LAB — RAD ONC ARIA SESSION SUMMARY
Course Elapsed Days: 14
Plan Fractions Treated to Date: 1
Plan Prescribed Dose Per Fraction: 2.67 Gy
Plan Total Fractions Prescribed: 6
Plan Total Prescribed Dose: 16.02 Gy
Reference Point Dosage Given to Date: 26.7 Gy
Reference Point Session Dosage Given: 2.67 Gy
Session Number: 10

## 2023-12-29 MED ORDER — LETROZOLE 2.5 MG PO TABS: 2.5000 mg | ORAL_TABLET | Freq: Every day | ORAL | 3 refills | Status: DC

## 2023-12-29 NOTE — Progress Notes (Signed)
 Patient Care Team: Sharlene Dory, DO as PCP - General (Family Medicine) [name], MD as Consulting Physician (General Surgery) Serena Croissant, MD as Consulting Physician (Hematology and Oncology) Lonie Peak, MD as Attending Physician (Radiation Oncology) Donnelly Angelica, RN as Oncology Nurse Navigator Pershing Proud, RN as Oncology Nurse Navigator  DIAGNOSIS:  Encounter Diagnosis  Name Primary?   Malignant neoplasm of upper-outer quadrant of right breast in female, estrogen receptor positive (HCC) Yes    SUMMARY OF ONCOLOGIC HISTORY: Oncology History  Malignant neoplasm of upper-outer quadrant of right breast in female, estrogen receptor positive (HCC)  09/08/2019 Initial Diagnosis   Routine screening mammogram detected a 0.8cm mass at the 10 o'clock position in the right breast, no right axillary adenopathy. Biopsy showed IDC with DCIS, grade 2, HER-2 - (1+), ER+ 100%, PR 70%, Ki67 15%.    09/08/2019 Cancer Staging   Staging form: Breast, AJCC 8th Edition - Clinical: Stage IA (cT1b, cN0, cM0, G2, ER+, PR+, HER2-)    09/15/2019 Surgery   Right lumpectomy (Tsuei) ([id number]): IDC with DCIS, 0.9cm, clear margins, 3 right axillary lymph nodes negative.    09/15/2019 Cancer Staging   Staging form: Breast, AJCC 8th Edition - Pathologic stage from 09/15/2019: Stage IA (pT1b, pN0, cM0, G2, ER+, PR+, HER2-)    10/2019 - 09/2024 Anti-estrogen oral therapy   Anastrozole daily   Malignant neoplasm of upper-outer quadrant of left female breast (HCC)  10/07/2023 Initial Diagnosis   Malignant neoplasm of upper-outer quadrant of left female breast (HCC)   10/13/2023 Cancer Staging   Staging form: Breast, AJCC 8th Edition - Clinical stage from 10/13/2023: Stage IA (cT1b, cN0, cM0, G2, ER+, PR+, HER2-) - Signed by Lonie Peak, MD on 10/13/2023 Stage prefix: Initial diagnosis Histologic grading system: 3 grade system     CHIEF COMPLIANT: Follow-up to discuss  antiestrogen therapy  HISTORY OF PRESENT ILLNESS:   History of Present Illness The patient, with a history of breast cancer, is currently undergoing radiation therapy, which is going 'great.' She expresses humor about not 'glowing in the dark' after the treatments. The radiation therapy is scheduled to end on April 7th, after a brief delay due to a machine malfunction.  The patient was previously on anastrozole, an anti-estrogen medication, but experienced undesirable side effects. The patient is agreeable to this plan.  The patient also mentions she no longer takes Zofran, a medication for nausea and vomiting. She has been experiencing joint stiffness, which is a potential side effect of letrozole, but attributes it to her current condition and not medication.  The patient is also advised to maintain bone density through calcium and vitamin D supplements, and weight-bearing exercises, which she has temporarily stopped due to the ongoing radiation therapy. She plans to resume these exercises after the completion of radiation therapy.     ALLERGIES:  has no known allergies.  MEDICATIONS:  Current Outpatient Medications  Medication Sig Dispense Refill   acetaminophen (TYLENOL) 500 MG tablet Take 1,000 mg by mouth daily.     amLODipine (NORVASC) 5 MG tablet TAKE 1 TABLET(5 MG) BY MOUTH DAILY 90 tablet 1   ferrous sulfate 325 (65 FE) MG EC tablet Take 1 tablet (325 mg total) by mouth daily with breakfast. 30 tablet 3   fexofenadine (ALLEGRA) 180 MG tablet Take 180 mg by mouth in the morning.     ondansetron (ZOFRAN-ODT) 8 MG disintegrating tablet Take 1 tablet (8 mg total) by mouth every 8 (eight) hours as needed for  nausea or vomiting. 20 tablet 0   Psyllium-Calcium (FIBER PLUS CALCIUM PO) Take 2 tablets by mouth in the morning. Chewables     No current facility-administered medications for this visit.    PHYSICAL EXAMINATION: ECOG PERFORMANCE STATUS: 1 - Symptomatic but completely  ambulatory  Vitals:   12/29/23 0804  BP: 126/63  Pulse: 62  Resp: 18  Temp: 98.3 F (36.8 C)  SpO2: 98%   Filed Weights   12/29/23 0804  Weight: 206 lb 8 oz (93.7 kg)    Physical Exam   (exam performed in the presence of a chaperone)  LABORATORY DATA:  I have reviewed the data as listed    Latest Ref Rng & Units 10/17/2023   11:30 AM 03/05/2023    2:00 PM 10/21/2022    2:39 PM  CMP  Glucose 70 - 99 mg/dL 161  096  045   BUN 8 - 23 mg/dL 12  17  16    Creatinine 0.44 - 1.00 mg/dL 4.09  8.11  9.14   Sodium 135 - 145 mmol/L 142  139  142   Potassium 3.5 - 5.1 mmol/L 4.6  3.7  4.3   Chloride 98 - 111 mmol/L 107  104  105   CO2 22 - 32 mmol/L 26  26  29    Calcium 8.9 - 10.3 mg/dL 9.3  8.8  9.2   Total Protein 6.5 - 8.1 g/dL  6.7  6.9   Total Bilirubin 0.3 - 1.2 mg/dL  0.4  0.4   Alkaline Phos 38 - 126 U/L  64  60   AST 15 - 41 U/L  17  11   ALT 0 - 44 U/L  16  6     Lab Results  Component Value Date   WBC 4.5 10/17/2023   HGB 13.0 10/17/2023   HCT 40.1 10/17/2023   MCV 95.0 10/17/2023   PLT 233 10/17/2023   NEUTROABS 3.6 09/30/2022    ASSESSMENT & PLAN:  Malignant neoplasm of upper-outer quadrant of right breast in female, estrogen receptor positive (HCC) 09/15/2019:Right lumpectomy (Tsuei): IDC with DCIS, 0.9cm, clear margins, 3 right axillary lymph nodes negative.  ER 100%, PR 70%, Ki-67 15%, HER-2 negative, T1BN0 stage Ia pathologic stage  Patient declined adjuvant radiation Prior treatment: Anastrozole 1 mg daily started 10/05/2019 -------------------------------------------------------------------------------------------------------------------------  Left breast 10/02/2023: Left breast 12:00 suspicious mass 0.6 cm biopsy: Grade 2 invasive mammary cancer with mammary carcinoma in situ ER 100%, PR 25%, Ki67 8%, HER2 0 negative   10/20/2023: Left lumpectomy: Grade 2 ILC with extensive LCIS, 1.5 cm, inferior margin positive, ER 100%, PR 25%, HER2 0, Ki-67 8% 11/04/2023:  Left inferior margin resection: Benign   Treatment plan: XRT (with Dr. [name]) 12/16/2023-01/05/2024 Adjuvant antiestrogen therapy with letrozole once daily x 7 years   Anastrozole counseling: We discussed the risks and benefits of anti-estrogen therapy with aromatase inhibitors. These include but not limited to insomnia, hot flashes, mood changes, vaginal dryness, bone density loss, and weight gain. We strongly believe that the benefits far outweigh the risks. Patient understands these risks and consented to starting treatment. Planned treatment duration is 7 years.  Return to clinic in 3 months for survivorship care plan visit ------------------------------------- Assessment and Plan Assessment & Plan Malignant neoplasm of upper-outer quadrant of right breast, estrogen receptor positive Currently undergoing radiation therapy, expected to conclude on April 7th. Transitioning to letrozole due to previous side effects with anastrozole. Discussed potential side effects of letrozole, including hot flashes and joint  stiffness. Emphasized bone health monitoring and lifestyle modifications. Letrozole to be taken for seven years, with bone density monitored biennially, and adequate calcium, vitamin D intake, and weight-bearing exercises encouraged. - Prescribe letrozole 2.5 mg once daily for seven years - Monitor bone density every two years - Ensure adequate calcium and vitamin D intake - Encourage weight-bearing exercises  Follow-up Follow-up in three months with nurse practitioner to assess response to letrozole and discuss survivorship, lifestyle, supplements, skin health, and bone health. Subsequent follow-up with doctor six months after nurse practitioner visit, then annually. - Schedule follow-up appointment in three months with nurse practitioner - Plan subsequent follow-up with doctor six months after nurse practitioner visit, then annually      No orders of the defined types were placed  in this encounter.  The patient has a good understanding of the overall plan. she agrees with it. she will call with any problems that may develop before the next visit here. Total time spent: 30 mins including face to face time and time spent for planning, charting and co-ordination of care   Tamsen Meek, MD 12/29/23

## 2023-12-29 NOTE — Assessment & Plan Note (Signed)
 09/15/2019:Right lumpectomy (Tsuei): IDC with DCIS, 0.9cm, clear margins, 3 right axillary lymph nodes negative.  ER 100%, PR 70%, Ki-67 15%, HER-2 negative, T1BN0 stage Ia pathologic stage  Patient declined adjuvant radiation Prior treatment: Anastrozole 1 mg daily started 10/05/2019 -------------------------------------------------------------------------------------------------------------------------  Left breast 10/02/2023: Left breast 12:00 suspicious mass 0.6 cm biopsy: Grade 2 invasive mammary cancer with mammary carcinoma in situ ER 100%, PR 25%, Ki67 8%, HER2 0 negative   10/20/2023: Left lumpectomy: Grade 2 ILC with extensive LCIS, 1.5 cm, inferior margin positive, ER 100%, PR 25%, HER2 0, Ki-67 8% 11/04/2023: Left inferior margin resection: Benign   Treatment plan: XRT (with Dr. [name]) 12/16/2023-01/05/2024 Adjuvant antiestrogen therapy with letrozole once daily x 7 years   Anastrozole counseling: We discussed the risks and benefits of anti-estrogen therapy with aromatase inhibitors. These include but not limited to insomnia, hot flashes, mood changes, vaginal dryness, bone density loss, and weight gain. We strongly believe that the benefits far outweigh the risks. Patient understands these risks and consented to starting treatment. Planned treatment duration is 7 years.  Return to clinic in 3 months for survivorship care plan visit

## 2023-12-30 ENCOUNTER — Ambulatory Visit
Admission: RE | Admit: 2023-12-30 | Discharge: 2023-12-30 | Disposition: A | Discharge status: Home / Self Care | Source: Ambulatory Visit | Attending: Radiation Oncology | Admitting: Radiation Oncology

## 2023-12-30 ENCOUNTER — Other Ambulatory Visit: Payer: Self-pay

## 2023-12-30 ENCOUNTER — Ambulatory Visit

## 2023-12-30 DIAGNOSIS — Z510 Encounter for antineoplastic radiation therapy: Secondary | ICD-10-CM | POA: Insufficient documentation

## 2023-12-30 DIAGNOSIS — C50412 Malignant neoplasm of upper-outer quadrant of left female breast: Secondary | ICD-10-CM | POA: Insufficient documentation

## 2023-12-30 DIAGNOSIS — Z170 Estrogen receptor positive status [ER+]: Secondary | ICD-10-CM | POA: Diagnosis not present

## 2023-12-30 DIAGNOSIS — Z79811 Long term (current) use of aromatase inhibitors: Secondary | ICD-10-CM | POA: Insufficient documentation

## 2023-12-30 DIAGNOSIS — C50411 Malignant neoplasm of upper-outer quadrant of right female breast: Secondary | ICD-10-CM | POA: Diagnosis not present

## 2023-12-30 LAB — RAD ONC ARIA SESSION SUMMARY
Course Elapsed Days: 15
Plan Fractions Treated to Date: 2
Plan Prescribed Dose Per Fraction: 2.67 Gy
Plan Total Fractions Prescribed: 6
Plan Total Prescribed Dose: 16.02 Gy
Reference Point Dosage Given to Date: 29.37 Gy
Reference Point Session Dosage Given: 2.67 Gy
Session Number: 11

## 2023-12-31 ENCOUNTER — Ambulatory Visit
Admission: RE | Admit: 2023-12-31 | Discharge: 2023-12-31 | Disposition: A | Discharge status: Home / Self Care | Source: Ambulatory Visit | Attending: Radiation Oncology | Admitting: Radiation Oncology

## 2023-12-31 ENCOUNTER — Encounter: Payer: Self-pay | Admitting: Licensed Clinical Social Worker

## 2023-12-31 ENCOUNTER — Other Ambulatory Visit: Payer: Self-pay

## 2023-12-31 DIAGNOSIS — C50411 Malignant neoplasm of upper-outer quadrant of right female breast: Secondary | ICD-10-CM | POA: Diagnosis not present

## 2023-12-31 DIAGNOSIS — Z170 Estrogen receptor positive status [ER+]: Secondary | ICD-10-CM | POA: Diagnosis not present

## 2023-12-31 DIAGNOSIS — Z79811 Long term (current) use of aromatase inhibitors: Secondary | ICD-10-CM | POA: Diagnosis not present

## 2023-12-31 DIAGNOSIS — Z510 Encounter for antineoplastic radiation therapy: Secondary | ICD-10-CM | POA: Diagnosis not present

## 2023-12-31 DIAGNOSIS — C50412 Malignant neoplasm of upper-outer quadrant of left female breast: Secondary | ICD-10-CM | POA: Diagnosis not present

## 2023-12-31 LAB — RAD ONC ARIA SESSION SUMMARY
Course Elapsed Days: 16
Plan Fractions Treated to Date: 3
Plan Prescribed Dose Per Fraction: 2.67 Gy
Plan Total Fractions Prescribed: 6
Plan Total Prescribed Dose: 16.02 Gy
Reference Point Dosage Given to Date: 32.04 Gy
Reference Point Session Dosage Given: 2.67 Gy
Session Number: 12

## 2023-12-31 NOTE — Progress Notes (Signed)
 [facility] CSW Progress Note  Visual merchandiser received notification from [facility] in [place] that patient was approved for assistance. Foundation notified pt directly of assistance and next steps.   Kelly Dini E [name], LCSW Clinical Social Worker [facility]

## 2024-01-01 ENCOUNTER — Other Ambulatory Visit: Payer: Self-pay

## 2024-01-01 ENCOUNTER — Ambulatory Visit
Admission: RE | Admit: 2024-01-01 | Discharge: 2024-01-01 | Disposition: A | Discharge status: Home / Self Care | Source: Ambulatory Visit | Attending: Radiation Oncology

## 2024-01-01 DIAGNOSIS — Z170 Estrogen receptor positive status [ER+]: Secondary | ICD-10-CM | POA: Diagnosis not present

## 2024-01-01 DIAGNOSIS — C50411 Malignant neoplasm of upper-outer quadrant of right female breast: Secondary | ICD-10-CM | POA: Diagnosis not present

## 2024-01-01 DIAGNOSIS — Z510 Encounter for antineoplastic radiation therapy: Secondary | ICD-10-CM | POA: Diagnosis not present

## 2024-01-01 DIAGNOSIS — C50412 Malignant neoplasm of upper-outer quadrant of left female breast: Secondary | ICD-10-CM | POA: Diagnosis not present

## 2024-01-01 DIAGNOSIS — Z79811 Long term (current) use of aromatase inhibitors: Secondary | ICD-10-CM | POA: Diagnosis not present

## 2024-01-01 LAB — RAD ONC ARIA SESSION SUMMARY
Course Elapsed Days: 17
Plan Fractions Treated to Date: 4
Plan Prescribed Dose Per Fraction: 2.67 Gy
Plan Total Fractions Prescribed: 6
Plan Total Prescribed Dose: 16.02 Gy
Reference Point Dosage Given to Date: 34.71 Gy
Reference Point Session Dosage Given: 2.67 Gy
Session Number: 13

## 2024-01-02 ENCOUNTER — Ambulatory Visit
Admission: RE | Admit: 2024-01-02 | Discharge: 2024-01-02 | Disposition: A | Discharge status: Home / Self Care | Source: Ambulatory Visit | Attending: Radiation Oncology | Admitting: Radiation Oncology

## 2024-01-02 ENCOUNTER — Other Ambulatory Visit: Payer: Self-pay

## 2024-01-02 ENCOUNTER — Ambulatory Visit

## 2024-01-02 DIAGNOSIS — Z79811 Long term (current) use of aromatase inhibitors: Secondary | ICD-10-CM | POA: Diagnosis not present

## 2024-01-02 DIAGNOSIS — Z170 Estrogen receptor positive status [ER+]: Secondary | ICD-10-CM | POA: Diagnosis not present

## 2024-01-02 DIAGNOSIS — C50411 Malignant neoplasm of upper-outer quadrant of right female breast: Secondary | ICD-10-CM | POA: Diagnosis not present

## 2024-01-02 DIAGNOSIS — C50412 Malignant neoplasm of upper-outer quadrant of left female breast: Secondary | ICD-10-CM | POA: Diagnosis not present

## 2024-01-02 DIAGNOSIS — Z510 Encounter for antineoplastic radiation therapy: Secondary | ICD-10-CM | POA: Diagnosis not present

## 2024-01-02 LAB — RAD ONC ARIA SESSION SUMMARY
Course Elapsed Days: 18
Plan Fractions Treated to Date: 5
Plan Prescribed Dose Per Fraction: 2.67 Gy
Plan Total Fractions Prescribed: 6
Plan Total Prescribed Dose: 16.02 Gy
Reference Point Dosage Given to Date: 37.38 Gy
Reference Point Session Dosage Given: 2.67 Gy
Session Number: 14

## 2024-01-05 ENCOUNTER — Ambulatory Visit
Admission: RE | Admit: 2024-01-05 | Discharge: 2024-01-05 | Disposition: A | Discharge status: Home / Self Care | Source: Ambulatory Visit | Attending: Radiation Oncology | Admitting: Radiation Oncology

## 2024-01-05 ENCOUNTER — Other Ambulatory Visit: Payer: Self-pay

## 2024-01-05 DIAGNOSIS — C50412 Malignant neoplasm of upper-outer quadrant of left female breast: Secondary | ICD-10-CM | POA: Diagnosis not present

## 2024-01-05 DIAGNOSIS — Z79811 Long term (current) use of aromatase inhibitors: Secondary | ICD-10-CM | POA: Diagnosis not present

## 2024-01-05 DIAGNOSIS — C50411 Malignant neoplasm of upper-outer quadrant of right female breast: Secondary | ICD-10-CM | POA: Diagnosis not present

## 2024-01-05 DIAGNOSIS — Z510 Encounter for antineoplastic radiation therapy: Secondary | ICD-10-CM | POA: Diagnosis not present

## 2024-01-05 DIAGNOSIS — Z170 Estrogen receptor positive status [ER+]: Secondary | ICD-10-CM | POA: Diagnosis not present

## 2024-01-05 LAB — RAD ONC ARIA SESSION SUMMARY
Course Elapsed Days: 21
Plan Fractions Treated to Date: 6
Plan Prescribed Dose Per Fraction: 2.67 Gy
Plan Total Fractions Prescribed: 6
Plan Total Prescribed Dose: 16.02 Gy
Reference Point Dosage Given to Date: 40.05 Gy
Reference Point Session Dosage Given: 2.67 Gy
Session Number: 15

## 2024-01-06 NOTE — Radiation Completion Notes (Signed)
 Patient Name: Kelly Dixon, GRITZ MRN: [id number] Date of Birth: 16-Oct-1945 Referring Physician: Arva Chafe, M.D. Date of Service: 2024-01-06 Radiation Oncologist: Lonie Peak, M.D. [facility]                             RADIATION ONCOLOGY END OF TREATMENT NOTE     Diagnosis: C50.412 Malignant neoplasm of upper-outer quadrant of left female breast Staging on 2019-09-15: Malignant neoplasm of upper-outer quadrant of right breast in female, estrogen receptor positive (HCC) T=pT1b, N=pN0, M=cM0 Staging on 2019-09-08: Malignant neoplasm of upper-outer quadrant of right breast in female, estrogen receptor positive (HCC) T=cT1b, N=cN0, M=cM0 Staging on 2023-10-13: Malignant neoplasm of upper-outer quadrant of left female breast (HCC) T=cT1b, N=cN0, M=cM0 Intent: Curative     ==========DELIVERED PLANS==========  First Treatment Date: 2023-12-15 Last Treatment Date: 2024-01-05   Plan Name: Breast_L_BH Site: Breast, Left Technique: 3D Mode: Photon Dose Per Fraction: 2.67 Gy Prescribed Dose (Delivered / Prescribed): 24.03 Gy / 40.05 Gy Prescribed Fxs (Delivered / Prescribed): 9 / 15   Plan Name: Breast_L_BHL2 Site: Breast, Left Technique: 3D Mode: Photon Dose Per Fraction: 2.67 Gy Prescribed Dose (Delivered / Prescribed): 16.02 Gy / 16.02 Gy Prescribed Fxs (Delivered / Prescribed): 6 / 6     ==========ON TREATMENT VISIT DATES========== 2023-12-15, 2023-12-22, 2023-12-29, 2024-01-05     ==========UPCOMING VISITS========== 03/23/2024 [facility] WELL VISIT, [facility] ANNUAL WELLNESS VISIT 2  03/22/2024 [facility] [place] [facility] Kathryne Hitch, MD  01/29/2024 [facility]RAD[facility]ATION ONC FOLLOW UP 20 Lonie Peak, MD  01/12/2024 [facility]COL[facility] LAB [facility]  01/12/2024 [facility] ONCOLOGY GEN COUNSEL 60 Koerner, Cari M        ==========APPENDIX - ON TREATMENT VISIT NOTES==========   See weekly On Treatment  Notes in Epic for details in the Media tab (listed as Progress notes on the On Treatment Visit Dates listed above).

## 2024-01-08 NOTE — Progress Notes (Signed)
 REFERRING PROVIDER: Cameron Cea, MD [address] [place],  [place] [address]  PRIMARY PROVIDER:  Jobe Mulder, DO  PRIMARY REASON FOR: Encounter Diagnoses  Name Primary?   Malignant neoplasm of upper-outer quadrant of right breast in female, estrogen receptor positive (HCC) Yes   Malignant neoplasm of upper-outer quadrant of left female breast, unspecified estrogen receptor status (HCC)    Family history of breast cancer     HISTORY OF PRESENT ILLNESS:   Kelly Dixon, a 78 y.o. female, was seen for a [place] cancer genetics consultation at the request of Dr. Lee Public due to a personal history of breast cancer.  Kelly Dixon presents to clinic today to discuss the possibility of a hereditary predisposition to cancer, to discuss genetic testing, and to further clarify her future cancer risks, as well as potential cancer risks for family members.   In December 2020, at the age of 80, Kelly Dixon was diagnosed with invasive ductal carcinoma of the right breast (ER+/PR+/HER2-).  The treatment plan included lumpectomy and anti-estrogens.   In January 2025, at the age of 65, Kelly Dixon was diagnosed with invasive lobular carcinoma of the left breast (ER+/PR+/HER2-) s/p lumpectomy and adjuvant radiation.  Treatment plan also includes anti-estrogens.   CANCER HISTORY:  Oncology History  Malignant neoplasm of upper-outer quadrant of right breast in female, estrogen receptor positive (HCC)  09/08/2019 Initial Diagnosis   Routine screening mammogram detected a 0.8cm mass at the 10 o'clock position in the right breast, no right axillary adenopathy. Biopsy showed IDC with DCIS, grade 2, HER-2 - (1+), ER+ 100%, PR 70%, Ki67 15%.    09/08/2019 Cancer Staging   Staging form: Breast, AJCC 8th Edition - Clinical: Stage IA (cT1b, cN0, cM0, G2, ER+, PR+, HER2-)    09/15/2019 Surgery   Right lumpectomy (Tsuei) ([id number]): IDC with DCIS, 0.9cm, clear margins, 3 right axillary lymph  nodes negative.    09/15/2019 Cancer Staging   Staging form: Breast, AJCC 8th Edition - Pathologic stage from 09/15/2019: Stage IA (pT1b, pN0, cM0, G2, ER+, PR+, HER2-)    10/2019 - 09/2024 Anti-estrogen oral therapy   Anastrozole daily   Malignant neoplasm of upper-outer quadrant of left female breast (HCC)  10/07/2023 Initial Diagnosis   Malignant neoplasm of upper-outer quadrant of left female breast (HCC)   10/13/2023 Cancer Staging   Staging form: Breast, AJCC 8th Edition - Clinical stage from 10/13/2023: Stage IA (cT1b, cN0, cM0, G2, ER+, PR+, HER2-) - Signed by Colie Dawes, MD on 10/13/2023 Stage prefix: Initial diagnosis Histologic grading system: 3 grade system      SCREENING/RISK FACTORS:  Colonoscopy: yes;  in 2023; one tubular adenoma f/u 7 years . Hysterectomy: no.  Ovaries intact: yes.  Dermatology screening: no  Blood transfusion within past 4 weeks: no   Past Medical History:  Diagnosis Date   Allergy 1996   Anemia    Arthritis    Breast cancer (HCC) 2020   right breast IDC   Breast cancer (HCC) 10/2023   left breast ILC   Cataract May 2018   Gallstones 05/31/2018   Hypertension    Personal history of radiation therapy    PONV (postoperative nausea and vomiting)     Past Surgical History:  Procedure Laterality Date   BREAST BIOPSY Right 08/2019   BREAST BIOPSY Left 10/02/2023   US  LT BREAST BX W LOC DEV 1ST LESION IMG BX SPEC US  GUIDE 10/02/2023 [facility]   BREAST BIOPSY  10/17/2023   MM LT  RADIOACTIVE SEED LOC MAMMO GUIDE 10/17/2023 [facility]   BREAST LUMPECTOMY Right 08/2019   BREAST LUMPECTOMY WITH RADIOACTIVE SEED AND SENTINEL LYMPH NODE BIOPSY Right 09/15/2019   Procedure: RIGHT BREAST LUMPECTOMY WITH RADIOACTIVE SEED AND SENTINEL LYMPH NODE BIOPSY;  Surgeon: Dareen Ebbing, MD;  Location: [facility];  Service: General;  Laterality: Right;   BREAST LUMPECTOMY WITH RADIOACTIVE SEED LOCALIZATION Left 10/20/2023    Procedure: LEFT BREAST LUMPECTOMY WITH RADIOACTIVE SEED LOCALIZATION;  Surgeon: Dareen Ebbing, MD;  Location: [facility] OR;  Service: General;  Laterality: Left;   CHOLECYSTECTOMY N/A 04/13/2019   Procedure: LAPAROSCOPIC CHOLECYSTECTOMY WITH INTRAOPERATIVE CHOLANGIOGRAM;  Surgeon: Dareen Ebbing, MD;  Location: [facility] OR;  Service: General;  Laterality: N/A;   DILATION AND CURETTAGE OF UTERUS     JOINT REPLACEMENT  June 2023   RE-EXCISION OF BREAST LUMPECTOMY Left 11/04/2023   Procedure: RE-EXCISION OF INFERIOR MARGIN-LEFT BREAST LUMPECTOMY;  Surgeon: Dareen Ebbing, MD;  Location: [facility];  Service: General;  Laterality: Left;  LMA   TOTAL KNEE ARTHROPLASTY Left 03/26/2022   Procedure: LEFT TOTAL KNEE ARTHROPLASTY;  Surgeon: Arnie Lao, MD;  Location: [facility] OR;  Service: Orthopedics;  Laterality: Left;   TUBAL LIGATION  1974    FAMILY HISTORY:  We obtained a detailed, 4-generation family history.  Significant diagnoses are listed below: Family History  Problem Relation Age of Onset   Breast cancer Sister        dx 67s; paternal half sister     Kelly Dixon is unaware of previous family history of genetic testing for hereditary cancer risks.  Other relatives are unavailable for genetic testing at this time.  She reported limited health history information about numerous relatives.  There is no reported Ashkenazi Jewish ancestry. There is no known consanguinity.  GENETIC COUNSELING ASSESSMENT: Kelly Dixon is a 78 y.o. female with a personal history of contralateral breast cancer which is somewhat suggestive of a hereditary cancer syndrome and predisposition to cancer g. We, therefore, discussed and recommended the following at today's visit.   DISCUSSION: We discussed that 5 - 10% of cancer is hereditary.  Most cases of hereditary breast cancer are associated with mutations in BRCA1/2.  There are other genes that can be associated with hereditary breast cancer syndromes..  We  discussed that testing is beneficial for several reasons including knowing how to follow individuals for their cancer risks and understanding if other family members could be at an increased risk for cancer and allowing them to undergo genetic testing.   We reviewed the characteristics, features and inheritance patterns of hereditary cancer syndromes. We also discussed genetic testing, including the appropriate family members to test, the process of testing, insurance coverage and turn-around-time for results. We discussed the implications of a negative, positive, carrier and/or variant of uncertain significant result. We recommended Kelly Dixon pursue genetic testing for a panel that includes genes associated with breast cancer and other cancers.   Kelly Dixon  was offered a common hereditary cancer panel (~40 genes) and an expanded pan-cancer panel (~70 genes). Kelly Dixon was informed of the benefits and limitations of each panel, including that expanded pan-cancer panels contain genes that do not have clear management guidelines at this point in time.  We also discussed that as the number of genes included on a panel increases, the chances of variants of uncertain significance increases.  After considering the benefits and limitations of each gene panel, Kelly Dixon  elected to have an expanded pan-cancer panel  through [employer]  The CancerNext-Expanded gene panel offered by [facility] and includes sequencing, rearrangement, and RNA analysis for the following 76 genes: AIP, ALK, APC, ATM, AXIN2, BAP1, BARD1, BMPR1A, BRCA1, BRCA2, BRIP1, CDC73, CDH1, CDK4, CDKN1B, CDKN2A, CEBPA, CHEK2, CTNNA1, DDX41, DICER1, ETV6, FH, FLCN, GATA2, LZTR1, MAX, MBD4, MEN1, MET, MLH1, MSH2, MSH3, MSH6, MUTYH, NF1, NF2, NTHL1, PALB2, PHOX2B, PMS2, POT1, PRKAR1A, PTCH1, PTEN, RAD51C, RAD51D, RB1, RET, RUNX1, SDHA, SDHAF2, SDHB, SDHC, SDHD, SMAD4, SMARCA4, SMARCB1, SMARCE1, STK11, SUFU, TMEM127, TP53, TSC1, TSC2, VHL, and WT1  (sequencing and deletion/duplication); EGFR, HOXB13, KIT, MITF, PDGFRA, POLD1, and POLE (sequencing only); EPCAM and GREM1 (deletion/duplication only).   Based on Kelly Dixon personal history of contralateral breast cancer, she meets NCCN medical criteria for genetic testing. She is the most informative relatives available for genetic testing.  Despite that she meets criteria, she may still have an out of pocket cost. We discussed that if her out of pocket cost for testing is over $100, the laboratory should contact her and discuss the self-pay prices and/or patient pay assistance programs.    PLAN: After considering the risks, benefits, and limitations, Kelly Dixon provided informed consent to pursue genetic testing and the blood sample was sent to [facility] for analysis of the CancerNext-Expanded +RNAinsight Panel. Results should be available within approximately 3 weeks, at which point they will be disclosed by telephone to Kelly Dixon, as will any additional recommendations warranted by these results. Kelly Dixon will receive a summary of her genetic counseling visit and a copy of her results once available. This information will also be available in Epic.    Kelly Dixon questions were answered to her satisfaction today. Our contact information was provided should additional questions or concerns arise. Thank you for the referral and allowing us  to share in the care of your patient.   Kato Wieczorek M. Ora Billing, MS, [facility] Genetic Counselor [email or web address] (P) [phone]   40 minutes were spent on the date of the encounter in service to the patient including preparation, face-to-face consultation, documentation and care coordination.  The patient was seen alone.  Drs. Iruku, [name] and/or Maryalice Smaller were available to discuss this case as needed.    _______________________________________________________________________ For Office Staff:  Number of people involved in session: 1 Was an Intern/  student involved with case: yes; Print production planner

## 2024-01-09 ENCOUNTER — Other Ambulatory Visit: Payer: Self-pay | Admitting: Emergency Medicine

## 2024-01-09 DIAGNOSIS — Z122 Encounter for screening for malignant neoplasm of respiratory organs: Secondary | ICD-10-CM

## 2024-01-09 DIAGNOSIS — Z87891 Personal history of nicotine dependence: Secondary | ICD-10-CM

## 2024-01-12 ENCOUNTER — Inpatient Hospital Stay: Payer: Medicare HMO | Attending: Hematology and Oncology | Admitting: Genetic Counselor

## 2024-01-12 ENCOUNTER — Encounter: Payer: Self-pay | Admitting: Genetic Counselor

## 2024-01-12 ENCOUNTER — Inpatient Hospital Stay: Admitting: Licensed Clinical Social Worker

## 2024-01-12 ENCOUNTER — Inpatient Hospital Stay: Payer: Medicare HMO

## 2024-01-12 DIAGNOSIS — C50411 Malignant neoplasm of upper-outer quadrant of right female breast: Secondary | ICD-10-CM

## 2024-01-12 DIAGNOSIS — Z803 Family history of malignant neoplasm of breast: Secondary | ICD-10-CM

## 2024-01-12 DIAGNOSIS — C50412 Malignant neoplasm of upper-outer quadrant of left female breast: Secondary | ICD-10-CM | POA: Diagnosis not present

## 2024-01-12 DIAGNOSIS — Z853 Personal history of malignant neoplasm of breast: Secondary | ICD-10-CM | POA: Diagnosis not present

## 2024-01-12 DIAGNOSIS — Z170 Estrogen receptor positive status [ER+]: Secondary | ICD-10-CM

## 2024-01-12 LAB — GENETIC SCREENING ORDER

## 2024-01-12 NOTE — Progress Notes (Signed)
 [facility] CSW Progress Note  Visual merchandiser met with patient to assist in sending bills to [facility] in [place].  Bills copied and sent. Reviewed with pt how to submit further bills and when she can utilize the grant until.  No other needs today. Pt will bring in more bills as she receives them.    Naaman Curro E Aeralyn Barna, LCSW Clinical Social Worker [facility]

## 2024-01-21 ENCOUNTER — Telehealth: Payer: Self-pay | Admitting: Genetic Counselor

## 2024-01-21 ENCOUNTER — Inpatient Hospital Stay: Admitting: Licensed Clinical Social Worker

## 2024-01-21 NOTE — Progress Notes (Signed)
 [facility] Clinical Social Work  CSW spoke with patient regarding needed documents/ bills to send to [facility] in [place].  Coordinated with pt and billing to receive current pending bills (from January and February). Sent to [place] with [employer] in [place] for assistance.   Saki Legore E Ailana Cuadrado, LCSW

## 2024-01-22 ENCOUNTER — Encounter: Payer: Self-pay | Admitting: Orthopaedic Surgery

## 2024-01-26 NOTE — Progress Notes (Signed)
  Kelly Dixon presents today for follow-up after completing radiation to her left breast on 01/05/2024.  Patient is doing well after radiation and in good spirits. Pain:Patient denies any pain. Skin: Skin is has some discoloration, she is using Eucerin cream. Encouraged patient to use vitamin E oil ROM: Denies Lymphedema: Denies MedOnc F/U: April 20, 2024 Other issues of note:  None  Pt reports Yes No Comments  Tamoxifen []  [x]    Letrozole  [x]  []  Patient is tolerating medication well.  Anastrazole []  [x]    Mammogram [x]  Date: January 2025 []      Encouraged patient to call the office for any questions or concerns.

## 2024-01-28 ENCOUNTER — Telehealth: Payer: Self-pay

## 2024-01-28 ENCOUNTER — Encounter: Payer: Self-pay | Admitting: Licensed Clinical Social Worker

## 2024-01-28 NOTE — Telephone Encounter (Signed)
 Attempted to call Ms. Turkovich for a one month telephone call but no response. Left voicemail and phone number to call back.

## 2024-01-28 NOTE — Progress Notes (Signed)
 [facility] CSW Progress Note  Visual merchandiser received bill from pt with request to help send it to [facility] in [place] for payment assistance. Bill sent to [employer] with [employer] in [place].    Zianna Dercole E Lynze Reddy, LCSW Clinical Social Worker [facility]

## 2024-01-29 ENCOUNTER — Ambulatory Visit
Admission: RE | Admit: 2024-01-29 | Discharge: 2024-01-29 | Disposition: A | Discharge status: Home / Self Care | Source: Ambulatory Visit | Attending: Radiation Oncology | Admitting: Radiation Oncology

## 2024-01-29 DIAGNOSIS — Z170 Estrogen receptor positive status [ER+]: Secondary | ICD-10-CM

## 2024-02-05 ENCOUNTER — Encounter: Payer: Self-pay | Admitting: Genetic Counselor

## 2024-02-05 ENCOUNTER — Telehealth: Payer: Self-pay | Admitting: Genetic Counselor

## 2024-02-05 ENCOUNTER — Inpatient Hospital Stay: Attending: Hematology and Oncology | Admitting: Licensed Clinical Social Worker

## 2024-02-05 ENCOUNTER — Ambulatory Visit: Payer: Self-pay | Admitting: Genetic Counselor

## 2024-02-05 DIAGNOSIS — Z170 Estrogen receptor positive status [ER+]: Secondary | ICD-10-CM

## 2024-02-05 DIAGNOSIS — Z1379 Encounter for other screening for genetic and chromosomal anomalies: Secondary | ICD-10-CM | POA: Insufficient documentation

## 2024-02-05 DIAGNOSIS — C50412 Malignant neoplasm of upper-outer quadrant of left female breast: Secondary | ICD-10-CM

## 2024-02-05 DIAGNOSIS — Z803 Family history of malignant neoplasm of breast: Secondary | ICD-10-CM

## 2024-02-05 NOTE — Progress Notes (Signed)
 [facility] CSW Progress Note  Patient brought in medical bills for [facility] in [facility].  CSW assisted in sending them for assistance with payment.     Jordanny Waddington E Dorie Ohms, LCSW Clinical Social Worker [facility]

## 2024-02-05 NOTE — Telephone Encounter (Signed)
 Disclosed negative genetics.

## 2024-02-11 NOTE — Telephone Encounter (Signed)
 Lvm asking for call back

## 2024-02-12 ENCOUNTER — Encounter: Payer: Self-pay | Admitting: Licensed Clinical Social Worker

## 2024-02-12 NOTE — Progress Notes (Signed)
 [facility] CSW Progress Note  Patient dropped off bills for assistance submitting to [facility] in [place]. CSW faxed bills to [employer] in [place] and mailed originals back to patient.    Kelly Dixon E Fredericka Bottcher, LCSW Clinical Social Worker [facility] [employer]

## 2024-02-12 NOTE — Progress Notes (Signed)
 HPI:   Kelly Dixon was previously seen in the [facility] Cancer Genetics clinic due to a personal history of breast cancer and concerns regarding a hereditary predisposition to cancer.    Kelly Dixon recent genetic test results were disclosed to her by telephone. These results and recommendations are discussed in more detail below.  CANCER HISTORY:  In December 2020, at the age of 78, Kelly Dixon was diagnosed with invasive ductal carcinoma of the right breast (ER+/PR+/HER2-).  The treatment plan included lumpectomy and anti-estrogens.    In January 2025, at the age of 73, Kelly Dixon was diagnosed with invasive lobular carcinoma of the left breast (ER+/PR+/HER2-) s/p lumpectomy and adjuvant radiation.  Treatment plan also includes anti-estrogens.    Oncology History  Malignant neoplasm of upper-outer quadrant of right breast in female, estrogen receptor positive (HCC)  09/08/2019 Initial Diagnosis   Routine screening mammogram detected a 0.8cm mass at the 10 o'clock position in the right breast, no right axillary adenopathy. Biopsy showed IDC with DCIS, grade 2, HER-2 - (1+), ER+ 100%, PR 70%, Ki67 15%.    09/08/2019 Cancer Staging   Staging form: Breast, AJCC 8th Edition - Clinical: Stage IA (cT1b, cN0, cM0, G2, ER+, PR+, HER2-)    09/15/2019 Surgery   Right lumpectomy ([facility]) ([id number]): IDC with DCIS, 0.9cm, clear margins, 3 right axillary lymph nodes negative.    09/15/2019 Cancer Staging   Staging form: Breast, AJCC 8th Edition - Pathologic stage from 09/15/2019: Stage IA (pT1b, pN0, cM0, G2, ER+, PR+, HER2-)    10/2019 - 09/2024 Anti-estrogen oral therapy   Anastrozole  daily   01/18/2024 Genetic Testing   Negative [facility] CancerNext-Expanded +RNAinsight Panel.  Report date is 01/18/2024.   The CancerNext-Expanded gene panel offered by [facility] and includes sequencing, rearrangement, and RNA analysis for the following 76 genes: AIP, ALK, APC, ATM, AXIN2, BAP1, BARD1, BMPR1A,  BRCA1, BRCA2, BRIP1, CDC73, CDH1, CDK4, CDKN1B, CDKN2A, CEBPA, CHEK2, CTNNA1, DDX41, DICER1, ETV6, FH, FLCN, GATA2, LZTR1, MAX, MBD4, MEN1, MET, MLH1, MSH2, MSH3, MSH6, MUTYH, NF1, NF2, NTHL1, PALB2, PHOX2B, PMS2, POT1, PRKAR1A, PTCH1, PTEN, RAD51C, RAD51D, RB1, RET, RUNX1, SDHA, SDHAF2, SDHB, SDHC, SDHD, SMAD4, SMARCA4, SMARCB1, SMARCE1, STK11, SUFU, TMEM127, TP53, TSC1, TSC2, VHL, and WT1 (sequencing and deletion/duplication); EGFR, HOXB13, KIT, MITF, PDGFRA, POLD1, and POLE (sequencing only); EPCAM and GREM1 (deletion/duplication only).    Malignant neoplasm of upper-outer quadrant of left female breast (HCC)  10/07/2023 Initial Diagnosis   Malignant neoplasm of upper-outer quadrant of left female breast (HCC)   10/13/2023 Cancer Staging   Staging form: Breast, AJCC 8th Edition - Clinical stage from 10/13/2023: Stage IA (cT1b, cN0, cM0, G2, ER+, PR+, HER2-) - Signed by Colie Dawes, MD on 10/13/2023 Stage prefix: Initial diagnosis Histologic grading system: 3 grade system   01/18/2024 Genetic Testing   Negative [facility] CancerNext-Expanded +RNAinsight Panel.  Report date is 01/18/2024.   The CancerNext-Expanded gene panel offered by [facility] and includes sequencing, rearrangement, and RNA analysis for the following 76 genes: AIP, ALK, APC, ATM, AXIN2, BAP1, BARD1, BMPR1A, BRCA1, BRCA2, BRIP1, CDC73, CDH1, CDK4, CDKN1B, CDKN2A, CEBPA, CHEK2, CTNNA1, DDX41, DICER1, ETV6, FH, FLCN, GATA2, LZTR1, MAX, MBD4, MEN1, MET, MLH1, MSH2, MSH3, MSH6, MUTYH, NF1, NF2, NTHL1, PALB2, PHOX2B, PMS2, POT1, PRKAR1A, PTCH1, PTEN, RAD51C, RAD51D, RB1, RET, RUNX1, SDHA, SDHAF2, SDHB, SDHC, SDHD, SMAD4, SMARCA4, SMARCB1, SMARCE1, STK11, SUFU, TMEM127, TP53, TSC1, TSC2, VHL, and WT1 (sequencing and deletion/duplication); EGFR, HOXB13, KIT, MITF, PDGFRA, POLD1, and POLE (sequencing only); EPCAM and GREM1 (deletion/duplication only).  FAMILY HISTORY:  We obtained a detailed, 4-generation family history.  Significant  diagnoses are listed below:      Family History  Problem Relation Age of Onset   Breast cancer Sister          dx 32s; paternal half sister       Kelly Dixon is unaware of previous family history of genetic testing for hereditary cancer risks.  Other relatives are unavailable for genetic testing at this time.  She reported limited health history information about numerous relatives.   There is no reported Ashkenazi Jewish ancestry. There is no known consanguinity.  GENETIC TEST RESULTS:  The [facility] CancerNext-Expanded +RNAinsight Panel found no pathogenic mutations.   The CancerNext-Expanded gene panel offered by [facility] and includes sequencing, rearrangement, and RNA analysis for the following 76 genes: AIP, ALK, APC, ATM, AXIN2, BAP1, BARD1, BMPR1A, BRCA1, BRCA2, BRIP1, CDC73, CDH1, CDK4, CDKN1B, CDKN2A, CEBPA, CHEK2, CTNNA1, DDX41, DICER1, ETV6, FH, FLCN, GATA2, LZTR1, MAX, MBD4, MEN1, MET, MLH1, MSH2, MSH3, MSH6, MUTYH, NF1, NF2, NTHL1, PALB2, PHOX2B, PMS2, POT1, PRKAR1A, PTCH1, PTEN, RAD51C, RAD51D, RB1, RET, RUNX1, SDHA, SDHAF2, SDHB, SDHC, SDHD, SMAD4, SMARCA4, SMARCB1, SMARCE1, STK11, SUFU, TMEM127, TP53, TSC1, TSC2, VHL, and WT1 (sequencing and deletion/duplication); EGFR, HOXB13, KIT, MITF, PDGFRA, POLD1, and POLE (sequencing only); EPCAM and GREM1 (deletion/duplication only).   The test report has been scanned into EPIC and is located under the Molecular Pathology section of the Results Review tab.  A portion of the result report is included below for reference. Genetic testing reported out on January 18, 2024.      Even though a pathogenic variant was not identified, possible explanations for the cancer in the family may include: There may be no hereditary risk for cancer in the family. The cancers in Kelly Dixon and/or her family may be sporadic/familial or due to other genetic and environmental factors.  Most cancer is not hereditary.  There may be a gene mutation in one of  these genes that current testing methods cannot detect but that chance is small. There could be another gene that has not yet been discovered, or that we have not yet tested, that is responsible for the cancer diagnoses in the family.  It is also possible there is a hereditary cause for the cancer in the family that Kelly Dixon did not inherit.   Therefore, it is important to remain in touch with cancer genetics in the future so that we can continue to offer Kelly Dixon the most up to date genetic testing.     ADDITIONAL GENETIC TESTING:   Kelly Dixon genetic testing was fairly extensive.  If there are additional relevant genes identified to increase cancer risk that can be analyzed in the future, we would be happy to discuss and coordinate this testing at that time.     CANCER SCREENING RECOMMENDATIONS:  Kelly Dixon test result is considered negative (normal).  This means that we have not identified a hereditary cause for her personal history of breast cancer at this time.    An individual's cancer risk and medical management are not determined by genetic test results alone. Overall cancer risk assessment incorporates additional factors, including personal medical history, family history, and any available genetic information that may result in a personalized plan for cancer prevention and surveillance. Therefore, it is recommended she continue to follow the cancer management and screening guidelines provided by her oncology and primary healthcare provider.     RECOMMENDATIONS FOR FAMILY MEMBERS:  Since she did not inherit a identifiable mutation in a cancer predisposition gene included on this panel, her children could not have inherited a known mutation from her in one of these genes. Individuals in this family might be at some increased risk of developing cancer, over the general population risk, due to the family history of cancer.  Individuals in the family should notify their providers of  the family history of cancer. We recommend women in this family have a yearly mammogram beginning at age 61, or 60 years younger than the earliest onset of cancer, an annual clinical breast exam, and perform monthly breast self-exams.  Risk models that take into account family history and hormonal history may be helpful in determining appropriate breast cancer screening options for family members.    FOLLOW-UP:  Cancer genetics is a rapidly advancing field and it is possible that new genetic tests will be appropriate for her and/or her family members in the future. We encourage Kelly Dixon to remain in contact with cancer genetics, so we can update her personal and family histories and let her know of advances in cancer genetics that may benefit this family.   Our contact number was provided.  They are welcome to call us  at anytime with additional questions or concerns.   Particia Strahm M. Ora Billing, MS, [facility] Genetic Counselor [email or web address] (P) [phone]

## 2024-02-27 NOTE — Progress Notes (Signed)
 Surgical Instructions   Your procedure is scheduled on Tuesday, June,10th. Report to [facility] Main Entrance "A" at 6:45 A.M., then check in with the Admitting office. Any questions or running late day of surgery: call [phone]  Questions prior to your surgery date: call [phone], Monday-Friday, 8am-4pm. If you experience any cold or flu symptoms such as cough, fever, chills, shortness of breath, etc. between now and your scheduled surgery, please notify us  at the above number.     Remember:  Do not eat after midnight the night before your surgery  You may drink clear liquids until 5:45 the morning of your surgery.   Clear liquids allowed are: Water, Non-Citrus Juices (without pulp), Carbonated Beverages, Clear Tea (no milk, honey, etc.), Black Coffee Only (NO MILK, CREAM OR POWDERED CREAMER of any kind), and Gatorade.    Take these medicines the morning of surgery with A SIP OF WATER  amLODipine  (NORVASC )  letrozole  (FEMARA )   May take these medicines IF NEEDED: acetaminophen  (TYLENOL )     One week prior to surgery, STOP taking any Aspirin  (unless otherwise instructed by your surgeon) Aleve, Naproxen, Ibuprofen, Motrin, Advil, Goody's, BC's, all herbal medications, fish oil, and non-prescription vitamins.                     Do NOT Smoke (Tobacco/Vaping) for 24 hours prior to your procedure.  If you use a CPAP at night, you may bring your mask/headgear for your overnight stay.   You will be asked to remove any contacts, glasses, piercing's, hearing aid's, dentures/partials prior to surgery. Please bring cases for these items if needed.    Patients discharged the day of surgery will not be allowed to drive home, and someone needs to stay with them for 24 hours.  SURGICAL WAITING ROOM VISITATION Patients may have no more than 2 support people in the waiting area - these visitors may rotate.   Pre-op nurse will coordinate an appropriate time for 1 ADULT support person,  who may not rotate, to accompany patient in pre-op.  Children under the age of 68 must have an adult with them who is not the patient and must remain in the main waiting area with an adult.  If the patient needs to stay at the hospital during part of their recovery, the visitor guidelines for inpatient rooms apply.  Please refer to the [facility] website for the visitor guidelines for any additional information.   If you received a COVID test during your pre-op visit  it is requested that you wear a mask when out in public, stay away from anyone that may not be feeling well and notify your surgeon if you develop symptoms. If you have been in contact with anyone that has tested positive in the last 10 days please notify you surgeon.      Pre-operative 5 CHG Bathing Instructions   You can play a key role in reducing the risk of infection after surgery. Your skin needs to be as free of germs as possible. You can reduce the number of germs on your skin by washing with CHG (chlorhexidine  gluconate) soap before surgery. CHG is an antiseptic soap that kills germs and continues to kill germs even after washing.   DO NOT use if you have an allergy to chlorhexidine /CHG or antibacterial soaps. If your skin becomes reddened or irritated, stop using the CHG and notify one of our RNs at [phone].   Please shower with the CHG soap starting 4  days before surgery using the following schedule:     Please keep in mind the following:  DO NOT shave, including legs and underarms, starting the day of your first shower.   You may shave your face at any point before/day of surgery.  Place clean sheets on your bed the day you start using CHG soap. Use a clean washcloth (not used since being washed) for each shower. DO NOT sleep with pets once you start using the CHG.   CHG Shower Instructions:  Wash your face and private area with normal soap. If you choose to wash your hair, wash first with your normal  shampoo.  After you use shampoo/soap, rinse your hair and body thoroughly to remove shampoo/soap residue.  Turn the water OFF and apply about 3 tablespoons (45 ml) of CHG soap to a CLEAN washcloth.  Apply CHG soap ONLY FROM YOUR NECK DOWN TO YOUR TOES (washing for 3-5 minutes)  DO NOT use CHG soap on face, private areas, open wounds, or sores.  Pay special attention to the area where your surgery is being performed.  If you are having back surgery, having someone wash your back for you may be helpful. Wait 2 minutes after CHG soap is applied, then you may rinse off the CHG soap.  Pat dry with a clean towel  Put on clean clothes/pajamas   If you choose to wear lotion, please use ONLY the CHG-compatible lotions that are listed below.  Additional instructions for the day of surgery: DO NOT APPLY any lotions, deodorants, cologne, or perfumes.   Do not bring valuables to the hospital. [facility] is not responsible for any belongings/valuables. Do not wear nail polish, gel polish, artificial nails, or any other type of covering on natural nails (fingers and toes) Do not wear jewelry or makeup Put on clean/comfortable clothes.  Please brush your teeth.  Ask your nurse before applying any prescription medications to the skin.     CHG Compatible Lotions   Aveeno Moisturizing lotion  Cetaphil Moisturizing Cream  Cetaphil Moisturizing Lotion  Clairol Herbal Essence Moisturizing Lotion, Dry Skin  Clairol Herbal Essence Moisturizing Lotion, Extra Dry Skin  Clairol Herbal Essence Moisturizing Lotion, Normal Skin  Curel Age Defying Therapeutic Moisturizing Lotion with Alpha Hydroxy  Curel Extreme Care Body Lotion  Curel Soothing Hands Moisturizing Hand Lotion  Curel Therapeutic Moisturizing Cream, Fragrance-Free  Curel Therapeutic Moisturizing Lotion, Fragrance-Free  Curel Therapeutic Moisturizing Lotion, Original Formula  Eucerin Daily Replenishing Lotion  Eucerin Dry Skin Therapy Plus  Alpha Hydroxy Crme  Eucerin Dry Skin Therapy Plus Alpha Hydroxy Lotion  Eucerin Original Crme  Eucerin Original Lotion  Eucerin Plus Crme Eucerin Plus Lotion  Eucerin TriLipid Replenishing Lotion  Keri Anti-Bacterial Hand Lotion  Keri Deep Conditioning Original Lotion Dry Skin Formula Softly Scented  Keri Deep Conditioning Original Lotion, Fragrance Free Sensitive Skin Formula  Keri Lotion Fast Absorbing Fragrance Free Sensitive Skin Formula  Keri Lotion Fast Absorbing Softly Scented Dry Skin Formula  Keri Original Lotion  Keri Skin Renewal Lotion Keri Silky Smooth Lotion  Keri Silky Smooth Sensitive Skin Lotion  Nivea Body Creamy Conditioning Oil  Nivea Body Extra Enriched Lotion  Nivea Body Original Lotion  Nivea Body Sheer Moisturizing Lotion Nivea Crme  Nivea Skin Firming Lotion  NutraDerm 30 Skin Lotion  NutraDerm Skin Lotion  NutraDerm Therapeutic Skin Cream  NutraDerm Therapeutic Skin Lotion  ProShield Protective Hand Cream  Provon moisturizing lotion  Please read over the following fact sheets that you were  given.

## 2024-03-01 ENCOUNTER — Encounter (HOSPITAL_COMMUNITY): Payer: Self-pay

## 2024-03-01 ENCOUNTER — Encounter (HOSPITAL_COMMUNITY)
Admission: RE | Admit: 2024-03-01 | Discharge: 2024-03-01 | Disposition: A | Discharge status: Home / Self Care | Source: Ambulatory Visit | Attending: Orthopaedic Surgery | Admitting: Orthopaedic Surgery

## 2024-03-01 ENCOUNTER — Other Ambulatory Visit: Payer: Self-pay

## 2024-03-01 VITALS — BP 143/74 | HR 65 | Temp 97.7°F | Resp 18 | Ht 62.0 in | Wt 206.3 lb

## 2024-03-01 DIAGNOSIS — Z923 Personal history of irradiation: Secondary | ICD-10-CM | POA: Diagnosis not present

## 2024-03-01 DIAGNOSIS — Z01812 Encounter for preprocedural laboratory examination: Secondary | ICD-10-CM | POA: Diagnosis not present

## 2024-03-01 DIAGNOSIS — Z853 Personal history of malignant neoplasm of breast: Secondary | ICD-10-CM | POA: Diagnosis not present

## 2024-03-01 DIAGNOSIS — M1711 Unilateral primary osteoarthritis, right knee: Secondary | ICD-10-CM | POA: Diagnosis not present

## 2024-03-01 DIAGNOSIS — I10 Essential (primary) hypertension: Secondary | ICD-10-CM | POA: Diagnosis not present

## 2024-03-01 DIAGNOSIS — Z9013 Acquired absence of bilateral breasts and nipples: Secondary | ICD-10-CM | POA: Diagnosis not present

## 2024-03-01 DIAGNOSIS — Z87891 Personal history of nicotine dependence: Secondary | ICD-10-CM | POA: Diagnosis not present

## 2024-03-01 DIAGNOSIS — Z01818 Encounter for other preprocedural examination: Secondary | ICD-10-CM

## 2024-03-01 LAB — CBC
HCT: 39.5 % (ref 36.0–46.0)
Hemoglobin: 13.2 g/dL (ref 12.0–15.0)
MCH: 31.8 pg (ref 26.0–34.0)
MCHC: 33.4 g/dL (ref 30.0–36.0)
MCV: 95.2 fL (ref 80.0–100.0)
Platelets: 202 10*3/uL (ref 150–400)
RBC: 4.15 MIL/uL (ref 3.87–5.11)
RDW: 11.9 % (ref 11.5–15.5)
WBC: 4.1 10*3/uL (ref 4.0–10.5)
nRBC: 0 % (ref 0.0–0.2)

## 2024-03-01 LAB — BASIC METABOLIC PANEL WITH GFR
Anion gap: 7 (ref 5–15)
BUN: 11 mg/dL (ref 8–23)
CO2: 27 mmol/L (ref 22–32)
Calcium: 8.9 mg/dL (ref 8.9–10.3)
Chloride: 106 mmol/L (ref 98–111)
Creatinine, Ser: 0.72 mg/dL (ref 0.44–1.00)
GFR, Estimated: >60 mL/min (ref >60)
Glucose, Bld: 108 mg/dL — ABNORMAL HIGH (ref 70–99)
Potassium: 4 mmol/L (ref 3.5–5.1)
Sodium: 140 mmol/L (ref 135–145)

## 2024-03-01 LAB — SURGICAL PCR SCREEN
MRSA, PCR: NEGATIVE
Staphylococcus aureus: NEGATIVE

## 2024-03-01 NOTE — Progress Notes (Signed)
 PCP - Jobe Mulder, DO   Cardiologist -   PPM/ICD -  Device Orders -  Rep Notified -   Chest x-ray -  CT CHEST - 03-10-23 EKG - 10-17-23 Stress Test -  ECHO -  Cardiac Cath -   Sleep Study -  denies CPAP - n/a  DM - denies  Blood Thinner Instructions: denies Aspirin  Instructions:n/a  ERAS Protcol -clear liquids until 5:45 PRE-SURGERY Ensure   COVID TEST-    Anesthesia review: yes hx htn, breast ca.  Patient denies shortness of breath, fever, cough and chest pain at PAT appointment   All instructions explained to the patient, with a verbal understanding of the material. Patient agrees to go over the instructions while at home for a better understanding. Patient also instructed to self quarantine after being tested for COVID-19. The opportunity to ask questions was provided.

## 2024-03-01 NOTE — Progress Notes (Signed)
 Surgical Instructions     Your procedure is scheduled on Tuesday, June,10th. Report to [facility] Main Entrance "A" at 6:45 A.M., then check in with the Admitting office. Any questions or running late day of surgery: call [phone]   Questions prior to your surgery date: call [phone], Monday-Friday, 8am-4pm. If you experience any cold or flu symptoms such as cough, fever, chills, shortness of breath, etc. between now and your scheduled surgery, please notify us  at the above number.            Remember:       Do not eat after midnight the night before your surgery   You may drink clear liquids until 5:45 the morning of your surgery.   Clear liquids allowed are: Water, Non-Citrus Juices (without pulp), Carbonated Beverages, Clear Tea (no milk, honey, etc.), Black Coffee Only (NO MILK, CREAM OR POWDERED CREAMER of any kind), and Gatorade.          Take these medicines the morning of surgery with A SIP OF WATER  amLODipine  (NORVASC )  letrozole  (FEMARA )    May take these medicines IF NEEDED: acetaminophen  (TYLENOL )     Patient Instructions  The night before surgery:  No food after midnight. ONLY clear liquids after midnight  The day of surgery (if you do NOT have diabetes):  Drink ONE (1) Pre-Surgery Clear Ensure by 5:45 the morning of surgery. Drink in one sitting. Do not sip.  This drink was given to you during your hospital  pre-op appointment visit.  Nothing else to drink after completing the  Pre-Surgery Clear Ensure.           If you have questions, please contact your surgeon's office.    One week prior to surgery, STOP taking any Aspirin  (unless otherwise instructed by your surgeon) Aleve, Naproxen, Ibuprofen, Motrin, Advil, Goody's, BC's, all herbal medications, fish oil, and non-prescription vitamins.                     Do NOT Smoke (Tobacco/Vaping) for 24 hours prior to your procedure.   If you use a CPAP at night, you may bring your mask/headgear for  your overnight stay.   You will be asked to remove any contacts, glasses, piercing's, hearing aid's, dentures/partials prior to surgery. Please bring cases for these items if needed.    Patients discharged the day of surgery will not be allowed to drive home, and someone needs to stay with them for 24 hours.   SURGICAL WAITING ROOM VISITATION Patients may have no more than 2 support people in the waiting area - these visitors may rotate.   Pre-op nurse will coordinate an appropriate time for 1 ADULT support person, who may not rotate, to accompany patient in pre-op.  Children under the age of 66 must have an adult with them who is not the patient and must remain in the main waiting area with an adult.   If the patient needs to stay at the hospital during part of their recovery, the visitor guidelines for inpatient rooms apply.   Please refer to the [facility] website for the visitor guidelines for any additional information.     If you received a COVID test during your pre-op visit  it is requested that you wear a mask when out in public, stay away from anyone that may not be feeling well and notify your surgeon if you develop symptoms. If you have been in contact with anyone that has tested positive  in the last 10 days please notify you surgeon.         Pre-operative 5 CHG Bathing Instructions    You can play a key role in reducing the risk of infection after surgery. Your skin needs to be as free of germs as possible. You can reduce the number of germs on your skin by washing with CHG (chlorhexidine  gluconate) soap before surgery. CHG is an antiseptic soap that kills germs and continues to kill germs even after washing.    DO NOT use if you have an allergy to chlorhexidine /CHG or antibacterial soaps. If your skin becomes reddened or irritated, stop using the CHG and notify one of our RNs at [phone].    Please shower with the CHG soap starting 4 days before surgery using the  following schedule:       Please keep in mind the following:  DO NOT shave, including legs and underarms, starting the day of your first shower.   You may shave your face at any point before/day of surgery.  Place clean sheets on your bed the day you start using CHG soap. Use a clean washcloth (not used since being washed) for each shower. DO NOT sleep with pets once you start using the CHG.    CHG Shower Instructions:  Wash your face and private area with normal soap. If you choose to wash your hair, wash first with your normal shampoo.  After you use shampoo/soap, rinse your hair and body thoroughly to remove shampoo/soap residue.  Turn the water OFF and apply about 3 tablespoons (45 ml) of CHG soap to a CLEAN washcloth.  Apply CHG soap ONLY FROM YOUR NECK DOWN TO YOUR TOES (washing for 3-5 minutes)  DO NOT use CHG soap on face, private areas, open wounds, or sores.  Pay special attention to the area where your surgery is being performed.  If you are having back surgery, having someone wash your back for you may be helpful. Wait 2 minutes after CHG soap is applied, then you may rinse off the CHG soap.  Pat dry with a clean towel  Put on clean clothes/pajamas   If you choose to wear lotion, please use ONLY the CHG-compatible lotions that are listed below.   Additional instructions for the day of surgery: DO NOT APPLY any lotions, deodorants, cologne, or perfumes.   Do not bring valuables to the hospital. [facility] is not responsible for any belongings/valuables. Do not wear nail polish, gel polish, artificial nails, or any other type of covering on natural nails (fingers and toes) Do not wear jewelry or makeup Put on clean/comfortable clothes.  Please brush your teeth.  Ask your nurse before applying any prescription medications to the skin.        CHG Compatible Lotions    Aveeno Moisturizing lotion  Cetaphil Moisturizing Cream  Cetaphil Moisturizing Lotion  Clairol  Herbal Essence Moisturizing Lotion, Dry Skin  Clairol Herbal Essence Moisturizing Lotion, Extra Dry Skin  Clairol Herbal Essence Moisturizing Lotion, Normal Skin  Curel Age Defying Therapeutic Moisturizing Lotion with Alpha Hydroxy  Curel Extreme Care Body Lotion  Curel Soothing Hands Moisturizing Hand Lotion  Curel Therapeutic Moisturizing Cream, Fragrance-Free  Curel Therapeutic Moisturizing Lotion, Fragrance-Free  Curel Therapeutic Moisturizing Lotion, Original Formula  Eucerin Daily Replenishing Lotion  Eucerin Dry Skin Therapy Plus Alpha Hydroxy Crme  Eucerin Dry Skin Therapy Plus Alpha Hydroxy Lotion  Eucerin Original Crme  Eucerin Original Lotion  Eucerin Plus Crme Eucerin Plus Lotion  Eucerin  TriLipid Replenishing Lotion  Keri Anti-Bacterial Hand Lotion  Keri Deep Conditioning Original Lotion Dry Skin Formula Softly Scented  Keri Deep Conditioning Original Lotion, Fragrance Free Sensitive Skin Formula  Keri Lotion Fast Absorbing Fragrance Free Sensitive Skin Formula  Keri Lotion Fast Absorbing Softly Scented Dry Skin Formula  Keri Original Lotion  Keri Skin Renewal Lotion Keri Silky Smooth Lotion  Keri Silky Smooth Sensitive Skin Lotion  Nivea Body Creamy Conditioning Oil  Nivea Body Extra Enriched Lotion  Nivea Body Original Lotion  Nivea Body Sheer Moisturizing Lotion Nivea Crme  Nivea Skin Firming Lotion  NutraDerm 30 Skin Lotion  NutraDerm Skin Lotion  NutraDerm Therapeutic Skin Cream  NutraDerm Therapeutic Skin Lotion  ProShield Protective Hand Cream  Provon moisturizing lotion   Please read over the following fact sheets that you were given.

## 2024-03-02 ENCOUNTER — Telehealth (HOSPITAL_BASED_OUTPATIENT_CLINIC_OR_DEPARTMENT_OTHER): Payer: Self-pay

## 2024-03-02 NOTE — Progress Notes (Signed)
 Anesthesia Chart Review:  Case: [id number] Date/Time: 03/09/24 0830   Procedure: ARTHROPLASTY, KNEE, TOTAL (Right: Knee)   Anesthesia type: Spinal   Diagnosis: Primary osteoarthritis of right knee [M17.11]   Pre-op diagnosis: osteoarthritis right knee   [facility] ROOM 05 / [facility]   Surgeons: Arnie Lao, MD       DISCUSSION: Patient is a 78 year old female scheduled for the above procedure.   History includes former smoker (quit 11/29/19), post-operative N/V, GERD, HTN, anemia, breast cancer (right breast lumpectomy 09/15/19; left breast lumpectomy 10/20/23 with re-excision inferior margin 11/04/23, s/p XRT 12/16/23-01/05/24, letrozole  initiated), osteoarthritis (left TKA 03/26/22), cholecystectomy (04/13/19).   Anesthesia APP Rudy Costain, PA-C spoke with her during her PAT visit on 03/01/24. She had questions about anesthesia options. She reportedly felt that in comparison to her 2025 breast surgeries, it took her longer to recover from anesthesia and felt weaker post-operatively after 2023 left TKA. She thought this was due to getting an inhaled anesthetic; However, he discussed that she received spinal anesthesia in additional to IV sedation for that procedure and not anesthesia gas. The spinal anesthesia likely contributed to LE feeling weaker in the few hours just after surgery. She TKA surgery was in the afternoon, so she ended up spending 2 nights in the hospital for PT but was discharged home. Breast surgeries in early 2025 were done with IV induction, general w/ LMA. Anticipated anesthesia for right TKA is spinal. She will further evaluated by her assigned anesthesiologist on the day of surgery for definitive anesthesia plan.     VS: BP (!) 143/74   Pulse 65   Temp 36.5 C   Resp 18   Ht 5\' 2"  (1.575 m)   Wt 93.6 kg   SpO2 97%   BMI 37.73 kg/m   PROVIDERS: Jobe Mulder, DO is PCP  Cameron Cea, MD is HEM-ONC Colie Dawes, MD is RAD-ONC Dareen Ebbing, MD is  general surgeon   LABS: Labs reviewed: Acceptable for surgery. (all labs ordered are listed, but only abnormal results are displayed)  Labs Reviewed  BASIC METABOLIC PANEL WITH GFR - Abnormal; Notable for the following components:      Result Value   Glucose, Bld 108 (*)    All other components within normal limits  SURGICAL PCR SCREEN  CBC     IMAGES: Xray right knee 12/15/23: 2 views of the right knee shows severe end-stage arthritis of the right  knee.  There is complete loss of joint space in the medial compartment and  patellofemoral joint.  There is varus malalignment and osteophytes in all  3 compartments.   CT Abd/pelvis 10/28/23: IMPRESSION: 1. Fluid-filled small bowel with mild small bowel wall thickening. Mild mesenteric edema. This likely indicates enteritis. No evidence of obstruction. 2. Moderate-sized esophageal hiatal hernia. 3. Nonobstructing stone in the left kidney. 4. Aortic atherosclerosis.   CT Chest LCS 03/10/23: IMPRESSION: - Lung-RADS 1, negative. Continue annual screening with low-dose chest CT without contrast in 12 months. - Aortic Atherosclerosis (ICD10-I70.0) and Emphysema (ICD10-J43.9).     EKG: 10/17/23: Normal sinus rhythm Low voltage QRS Borderline ECG     CV: N/A   Past Medical History:  Diagnosis Date   Allergy 1996   Anemia    Arthritis    Breast cancer (HCC) 2020   right breast IDC   Breast cancer (HCC) 10/2023   left breast ILC   Cataract May 2018   Gallstones 05/31/2018   Hypertension    Personal  history of radiation therapy    PONV (postoperative nausea and vomiting)    Syphilis     Past Surgical History:  Procedure Laterality Date   BREAST BIOPSY Right 08/2019   BREAST BIOPSY Left 10/02/2023   US  LT BREAST BX W LOC DEV 1ST LESION IMG BX SPEC US  GUIDE 10/02/2023 [facility]   BREAST BIOPSY  10/17/2023   MM LT RADIOACTIVE SEED LOC MAMMO GUIDE 10/17/2023 [facility]   BREAST LUMPECTOMY Right 08/2019    BREAST LUMPECTOMY WITH RADIOACTIVE SEED AND SENTINEL LYMPH NODE BIOPSY Right 09/15/2019   Procedure: RIGHT BREAST LUMPECTOMY WITH RADIOACTIVE SEED AND SENTINEL LYMPH NODE BIOPSY;  Surgeon: Dareen Ebbing, MD;  Location: [facility];  Service: General;  Laterality: Right;   BREAST LUMPECTOMY WITH RADIOACTIVE SEED LOCALIZATION Left 10/20/2023   Procedure: LEFT BREAST LUMPECTOMY WITH RADIOACTIVE SEED LOCALIZATION;  Surgeon: Dareen Ebbing, MD;  Location: [facility];  Service: General;  Laterality: Left;   CHOLECYSTECTOMY N/A 04/13/2019   Procedure: LAPAROSCOPIC CHOLECYSTECTOMY WITH INTRAOPERATIVE CHOLANGIOGRAM;  Surgeon: Dareen Ebbing, MD;  Location: [facility] OR;  Service: General;  Laterality: N/A;   DILATION AND CURETTAGE OF UTERUS     JOINT REPLACEMENT  June 2023   RE-EXCISION OF BREAST LUMPECTOMY Left 11/04/2023   Procedure: RE-EXCISION OF INFERIOR MARGIN-LEFT BREAST LUMPECTOMY;  Surgeon: Dareen Ebbing, MD;  Location: [facility];  Service: General;  Laterality: Left;  LMA   TOTAL KNEE ARTHROPLASTY Left 03/26/2022   Procedure: LEFT TOTAL KNEE ARTHROPLASTY;  Surgeon: Arnie Lao, MD;  Location: [facility];  Service: Orthopedics;  Laterality: Left;   TUBAL LIGATION  1974    MEDICATIONS:  acetaminophen  (TYLENOL ) 500 MG tablet   amLODipine  (NORVASC ) 5 MG tablet   Doxylamine Succinate, Sleep, (SLEEP AID PO)   ferrous sulfate  325 (65 FE) MG EC tablet   fexofenadine (ALLEGRA) 180 MG tablet   letrozole  (FEMARA ) 2.5 MG tablet   Psyllium-Calcium  (FIBER PLUS CALCIUM  PO)   No current facility-administered medications for this encounter.    Ella Gun, PA-C Surgical Short Stay/Anesthesiology [facility] Phone [phone] [facility] Phone [phone] 03/02/2024 12:48 PM

## 2024-03-02 NOTE — Anesthesia Preprocedure Evaluation (Addendum)
 Anesthesia Evaluation  Patient identified by MRN, date of birth, ID band Patient awake    Reviewed: Allergy & Precautions, NPO status , Patient's Chart, lab work & pertinent test results  History of Anesthesia Complications (+) PONV and history of anesthetic complications  Airway Mallampati: II  TM Distance: >3 FB Neck ROM: Full    Dental  (+) Edentulous Upper, Edentulous Lower, Dental Advisory Given   Pulmonary former smoker   Pulmonary exam normal breath sounds clear to auscultation       Cardiovascular hypertension, Pt. on medications Normal cardiovascular exam Rhythm:Regular Rate:Normal     Neuro/Psych negative neurological ROS  negative psych ROS   GI/Hepatic negative GI ROS, Neg liver ROS,,,  Endo/Other  negative endocrine ROS    Renal/GU negative Renal ROS  negative genitourinary   Musculoskeletal  (+) Arthritis ,    Abdominal   Peds  Hematology  (+) Blood dyscrasia, anemia   Anesthesia Other Findings   Reproductive/Obstetrics                             Anesthesia Physical Anesthesia Plan  ASA: 3  Anesthesia Plan: Regional, MAC and Spinal   Post-op Pain Management: Tylenol  PO (pre-op)*, Celebrex PO (pre-op)* and Regional block*   Induction: Intravenous  PONV Risk Score and Plan: 4 or greater and Ondansetron , Treatment may vary due to age or medical condition and TIVA  Airway Management Planned: Natural Airway, Simple Face Mask and Mask  Additional Equipment: None  Intra-op Plan:   Post-operative Plan: Extubation in OR  Informed Consent: I have reviewed the patients History and Physical, chart, labs and discussed the procedure including the risks, benefits and alternatives for the proposed anesthesia with the patient or authorized representative who has indicated his/her understanding and acceptance.     Dental advisory given  Plan Discussed with: CRNA and  Anesthesiologist  Anesthesia Plan Comments: (History includes former smoker (quit 11/29/19), post-operative N/V, GERD, HTN, anemia, breast cancer (right breast lumpectomy 09/15/19; left breast lumpectomy 10/20/23 with re-excision inferior margin 11/04/23, s/p XRT 12/16/23-01/05/24, letrozole  initiated), osteoarthritis (left TKA 03/26/22), cholecystectomy (04/13/19).  PAT note written 03/02/2024 by Allison Zelenak, PA-C. Rudy Costain, PA-C spoke with her during her PAT visit. She had questions about anesthesia options. She reportedly felt that in comparison to her 2025 breast surgeries, it took her longer to recover from anesthesia and felt weaker post-operatively after 2023 left TKA. She thought this was due to getting an inhaled anesthetic; However, he discussed that she received spinal anesthesia in additional to IV sedation for that procedure and not anesthesia gas. The spinal anesthesia likely contributed to LE feeling weaker in the few hours just after surgery. She TKA surgery was in the afternoon, so she ended up spending 2 nights in the hospital for PT but was discharged home. Breast surgeries in early 2025 were done with IV induction, general w/ LMA. Anticipated anesthesia for right TKA is spinal. She will further evaluated by her assigned anesthesiologist on the day of surgery for definitive anesthesia plan. )       Anesthesia Quick Evaluation

## 2024-03-08 ENCOUNTER — Other Ambulatory Visit: Payer: Self-pay | Admitting: *Deleted

## 2024-03-08 ENCOUNTER — Telehealth: Payer: Self-pay | Admitting: *Deleted

## 2024-03-08 DIAGNOSIS — M1711 Unilateral primary osteoarthritis, right knee: Secondary | ICD-10-CM

## 2024-03-08 NOTE — Care Plan (Signed)
 [facility] RNCM call to patient to discuss her upcoming Right total knee arthroplasty with Dr. Lucienne Ryder on 03/09/24 at [facility]. She is agreeable to case management. She lives alone, but has a friend that will be assisting her after surgery at home. She has a RW already. Anticipate HHPT will be needed after a short hospital stay. Referral made to [facility] after choice provided. Reviewed all post op care instructions and questions answered. Will continue to follow for needs.

## 2024-03-08 NOTE — H&P (Signed)
 TOTAL KNEE ADMISSION H&P  Patient is being admitted for right total knee arthroplasty.  Subjective:  Chief Complaint:right knee pain.  HPI: Kelly Dixon, 78 y.o. female, has a history of pain and functional disability in the right knee due to arthritis and has failed non-surgical conservative treatments for greater than 12 weeks to includeNSAID's and/or analgesics, corticosteriod injections, viscosupplementation injections, flexibility and strengthening excercises, use of assistive devices, weight reduction as appropriate, and activity modification.  Onset of symptoms was gradual, starting 5 years ago with gradually worsening course since that time. The patient noted no past surgery on the right knee(s).  Patient currently rates pain in the right knee(s) at 10 out of 10 with activity. Patient has night pain, worsening of pain with activity and weight bearing, pain that interferes with activities of daily living, pain with passive range of motion, crepitus, and joint swelling.  Patient has evidence of subchondral sclerosis, periarticular osteophytes, and joint space narrowing by imaging studies. There is no active infection.  Patient Active Problem List   Diagnosis Date Noted   Genetic testing 02/05/2024   Malignant neoplasm of upper-outer quadrant of left female breast (HCC) 10/07/2023   SBO (small bowel obstruction) (HCC) 09/28/2022   Status post total left knee replacement 03/26/2022   Unilateral primary osteoarthritis, right knee 01/22/2022   Bilateral primary osteoarthritis of knee 01/09/2022   Chronic pain of both knees 10/09/2020   Essential hypertension 09/04/2020   Malignant neoplasm of upper-outer quadrant of right breast in female, estrogen receptor positive (HCC) 09/08/2019   Past Medical History:  Diagnosis Date   Allergy 1996   Anemia    Arthritis    Breast cancer (HCC) 2020   right breast IDC   Breast cancer (HCC) 10/2023   left breast ILC   Cataract May 2018    Gallstones 05/31/2018   Hypertension    Personal history of radiation therapy    PONV (postoperative nausea and vomiting)    Syphilis     Past Surgical History:  Procedure Laterality Date   BREAST BIOPSY Right 08/2019   BREAST BIOPSY Left 10/02/2023   US  LT BREAST BX W LOC DEV 1ST LESION IMG BX SPEC US  GUIDE 10/02/2023 [facility]   BREAST BIOPSY  10/17/2023   MM LT RADIOACTIVE SEED LOC MAMMO GUIDE 10/17/2023 [facility]   BREAST LUMPECTOMY Right 08/2019   BREAST LUMPECTOMY WITH RADIOACTIVE SEED AND SENTINEL LYMPH NODE BIOPSY Right 09/15/2019   Procedure: RIGHT BREAST LUMPECTOMY WITH RADIOACTIVE SEED AND SENTINEL LYMPH NODE BIOPSY;  Surgeon: Dareen Ebbing, MD;  Location: [facility];  Service: General;  Laterality: Right;   BREAST LUMPECTOMY WITH RADIOACTIVE SEED LOCALIZATION Left 10/20/2023   Procedure: LEFT BREAST LUMPECTOMY WITH RADIOACTIVE SEED LOCALIZATION;  Surgeon: Dareen Ebbing, MD;  Location: [facility] OR;  Service: General;  Laterality: Left;   CHOLECYSTECTOMY N/A 04/13/2019   Procedure: LAPAROSCOPIC CHOLECYSTECTOMY WITH INTRAOPERATIVE CHOLANGIOGRAM;  Surgeon: Dareen Ebbing, MD;  Location: [facility] OR;  Service: General;  Laterality: N/A;   DILATION AND CURETTAGE OF UTERUS     JOINT REPLACEMENT  June 2023   RE-EXCISION OF BREAST LUMPECTOMY Left 11/04/2023   Procedure: RE-EXCISION OF INFERIOR MARGIN-LEFT BREAST LUMPECTOMY;  Surgeon: Dareen Ebbing, MD;  Location: [facility];  Service: General;  Laterality: Left;  LMA   TOTAL KNEE ARTHROPLASTY Left 03/26/2022   Procedure: LEFT TOTAL KNEE ARTHROPLASTY;  Surgeon: Arnie Lao, MD;  Location: [facility] OR;  Service: Orthopedics;  Laterality: Left;   TUBAL LIGATION  1974  No current facility-administered medications for this encounter.   Current Outpatient Medications  Medication Sig Dispense Refill Last Dose/Taking   acetaminophen  (TYLENOL ) 500 MG tablet Take 1,000 mg by mouth every 6 (six) hours as  needed for moderate pain (pain score 4-6).   Taking As Needed   amLODipine  (NORVASC ) 5 MG tablet TAKE 1 TABLET(5 MG) BY MOUTH DAILY 90 tablet 1 Taking   Doxylamine Succinate, Sleep, (SLEEP AID PO) Take 1 tablet by mouth at bedtime.   Taking   ferrous sulfate  325 (65 FE) MG EC tablet Take 1 tablet (325 mg total) by mouth daily with breakfast. 30 tablet 3 Taking   fexofenadine (ALLEGRA) 180 MG tablet Take 180 mg by mouth in the morning.   Taking   letrozole  (FEMARA ) 2.5 MG tablet Take 1 tablet (2.5 mg total) by mouth daily. 90 tablet 3 Taking   Psyllium-Calcium  (FIBER PLUS CALCIUM  PO) Take 2 tablets by mouth in the morning. Chewables   Taking   No Known Allergies  Social History   Tobacco Use   Smoking status: Former    Current packs/day: 0.00    Average packs/day: 0.5 packs/day for 54.2 years (27.1 ttl pk-yrs)    Types: Cigarettes    Start date: 10/04/1965    Quit date: 11/29/2019    Years since quitting: 4.2   Smokeless tobacco: Never  Substance Use Topics   Alcohol use: Not Currently    Family History  Problem Relation Age of Onset   Arthritis Mother    Hearing loss Mother    Heart disease Father    Arthritis Sister    Breast cancer Sister        dx 68s; paternal half sister   Colon cancer Neg Hx    Colon polyps Neg Hx    Esophageal cancer Neg Hx    Rectal cancer Neg Hx    Stomach cancer Neg Hx      Review of Systems  Objective:  Physical Exam Vitals reviewed.  Constitutional:      Appearance: Normal appearance.  HENT:     Head: Normocephalic and atraumatic.  Eyes:     Extraocular Movements: Extraocular movements intact.     Pupils: Pupils are equal, round, and reactive to light.  Cardiovascular:     Rate and Rhythm: Normal rate and regular rhythm.  Pulmonary:     Effort: Pulmonary effort is normal.     Breath sounds: Normal breath sounds.  Abdominal:     Palpations: Abdomen is soft.  Musculoskeletal:     Cervical back: Normal range of motion and neck supple.      Right knee: Effusion, bony tenderness and crepitus present. Decreased range of motion. Tenderness present over the medial joint line and lateral joint line. Abnormal alignment and abnormal meniscus.  Neurological:     Mental Status: She is alert and oriented to person, place, and time.  Psychiatric:        Behavior: Behavior normal.     Vital signs in last 24 hours:    Labs:   Estimated body mass index is 37.73 kg/m as calculated from the following:   Height as of 03/01/24: 5\' 2"  (1.575 m).   Weight as of 03/01/24: 93.6 kg.   Imaging Review Plain radiographs demonstrate severe degenerative joint disease of the right knee(s). The overall alignment ismild varus. The bone quality appears to be good for age and reported activity level.      Assessment/Plan:  End stage arthritis, right knee  The patient history, physical examination, clinical judgment of the provider and imaging studies are consistent with end stage degenerative joint disease of the right knee(s) and total knee arthroplasty is deemed medically necessary. The treatment options including medical management, injection therapy arthroscopy and arthroplasty were discussed at length. The risks and benefits of total knee arthroplasty were presented and reviewed. The risks due to aseptic loosening, infection, stiffness, patella tracking problems, thromboembolic complications and other imponderables were discussed. The patient acknowledged the explanation, agreed to proceed with the plan and consent was signed. Patient is being admitted for inpatient treatment for surgery, pain control, PT, OT, prophylactic antibiotics, VTE prophylaxis, progressive ambulation and ADL's and discharge planning. The patient is planning to be discharged home with home health services

## 2024-03-08 NOTE — Progress Notes (Signed)
 amb

## 2024-03-08 NOTE — Telephone Encounter (Signed)
 [facility] RNCM call to patient and reviewed post op instructions.

## 2024-03-09 ENCOUNTER — Encounter (HOSPITAL_COMMUNITY): Payer: Self-pay | Admitting: Orthopaedic Surgery

## 2024-03-09 ENCOUNTER — Observation Stay (HOSPITAL_COMMUNITY)
Admission: RE | Admit: 2024-03-09 | Discharge: 2024-03-10 | Disposition: A | Discharge status: Home / Self Care | Attending: Orthopaedic Surgery | Admitting: Orthopaedic Surgery

## 2024-03-09 ENCOUNTER — Observation Stay (HOSPITAL_COMMUNITY)

## 2024-03-09 ENCOUNTER — Ambulatory Visit (HOSPITAL_COMMUNITY): Payer: Self-pay | Admitting: Anesthesiology

## 2024-03-09 ENCOUNTER — Other Ambulatory Visit: Payer: Self-pay

## 2024-03-09 ENCOUNTER — Encounter (HOSPITAL_COMMUNITY)
Admission: RE | Disposition: A | Discharge status: Home / Self Care | Payer: Self-pay | Source: Home / Self Care | Attending: Orthopaedic Surgery

## 2024-03-09 ENCOUNTER — Ambulatory Visit (HOSPITAL_COMMUNITY): Payer: Self-pay | Admitting: Vascular Surgery

## 2024-03-09 DIAGNOSIS — I10 Essential (primary) hypertension: Secondary | ICD-10-CM | POA: Insufficient documentation

## 2024-03-09 DIAGNOSIS — M1711 Unilateral primary osteoarthritis, right knee: Secondary | ICD-10-CM

## 2024-03-09 DIAGNOSIS — Z87891 Personal history of nicotine dependence: Secondary | ICD-10-CM

## 2024-03-09 DIAGNOSIS — G8918 Other acute postprocedural pain: Secondary | ICD-10-CM | POA: Diagnosis not present

## 2024-03-09 DIAGNOSIS — R112 Nausea with vomiting, unspecified: Secondary | ICD-10-CM | POA: Diagnosis not present

## 2024-03-09 DIAGNOSIS — Z96651 Presence of right artificial knee joint: Secondary | ICD-10-CM | POA: Diagnosis not present

## 2024-03-09 DIAGNOSIS — M25461 Effusion, right knee: Secondary | ICD-10-CM | POA: Diagnosis not present

## 2024-03-09 HISTORY — PX: TOTAL KNEE ARTHROPLASTY: SHX125

## 2024-03-09 SURGERY — ARTHROPLASTY, KNEE, TOTAL
Anesthesia: Monitor Anesthesia Care | Site: Knee | Laterality: Right

## 2024-03-09 MED ORDER — FERROUS SULFATE 325 (65 FE) MG PO TABS
325.0000 mg | ORAL_TABLET | Freq: Every day | ORAL | Status: DC
  Administered 2024-03-10: 325 mg via ORAL
  Filled 2024-03-09 (×3): qty 1

## 2024-03-09 MED ORDER — BUPIVACAINE-EPINEPHRINE (PF) 0.25% -1:200000 IJ SOLN
INTRAMUSCULAR | Status: AC
  Filled 2024-03-09: qty 30

## 2024-03-09 MED ORDER — MENTHOL 3 MG MT LOZG: 1.0000 | LOZENGE | OROMUCOSAL | Status: DC | PRN

## 2024-03-09 MED ORDER — BUPIVACAINE IN DEXTROSE 0.75-8.25 % IT SOLN
INTRATHECAL | Status: DC | PRN
  Administered 2024-03-09: 1.8 mL via INTRATHECAL

## 2024-03-09 MED ORDER — POLYETHYLENE GLYCOL 3350 17 G PO PACK: 17.0000 g | PACK | Freq: Every day | ORAL | Status: DC | PRN

## 2024-03-09 MED ORDER — APREPITANT 40 MG PO CAPS
40.0000 mg | ORAL_CAPSULE | Freq: Once | ORAL | Status: AC
  Administered 2024-03-09: 40 mg via ORAL
  Filled 2024-03-09: qty 1

## 2024-03-09 MED ORDER — ACETAMINOPHEN 500 MG PO TABS
1000.0000 mg | ORAL_TABLET | Freq: Once | ORAL | Status: AC
  Administered 2024-03-09: 1000 mg via ORAL
  Filled 2024-03-09: qty 2

## 2024-03-09 MED ORDER — ASPIRIN 81 MG PO CHEW
81.0000 mg | CHEWABLE_TABLET | Freq: Two times a day (BID) | ORAL | Status: DC
Start: 2024-03-09 — End: 2024-03-10
  Administered 2024-03-10: 81 mg via ORAL
  Filled 2024-03-09 (×2): qty 1

## 2024-03-09 MED ORDER — DIPHENHYDRAMINE HCL 12.5 MG/5ML PO ELIX: 12.5000 mg | ORAL_SOLUTION | ORAL | Status: DC | PRN

## 2024-03-09 MED ORDER — PHENOL 1.4 % MT LIQD: 1.0000 | OROMUCOSAL | Status: DC | PRN

## 2024-03-09 MED ORDER — LETROZOLE 2.5 MG PO TABS
2.5000 mg | ORAL_TABLET | Freq: Every day | ORAL | Status: DC
  Administered 2024-03-10: 2.5 mg via ORAL
  Filled 2024-03-09 (×2): qty 1

## 2024-03-09 MED ORDER — FENTANYL CITRATE (PF) 100 MCG/2ML IJ SOLN: 25.0000 ug | INTRAMUSCULAR | Status: DC | PRN

## 2024-03-09 MED ORDER — HYDROMORPHONE HCL 1 MG/ML IJ SOLN: 0.5000 mg | INTRAMUSCULAR | Status: DC | PRN

## 2024-03-09 MED ORDER — OXYCODONE HCL 5 MG PO TABS: 10.0000 mg | ORAL_TABLET | ORAL | Status: DC | PRN

## 2024-03-09 MED ORDER — FENTANYL CITRATE (PF) 250 MCG/5ML IJ SOLN
INTRAMUSCULAR | Status: AC
Start: 2024-03-09 — End: ?
  Filled 2024-03-09: qty 5

## 2024-03-09 MED ORDER — CEFAZOLIN SODIUM-DEXTROSE 2-4 GM/100ML-% IV SOLN
2.0000 g | Freq: Four times a day (QID) | INTRAVENOUS | Status: AC
  Administered 2024-03-09 (×2): 2 g via INTRAVENOUS
  Filled 2024-03-09 (×2): qty 100

## 2024-03-09 MED ORDER — OXYCODONE HCL 5 MG/5ML PO SOLN: 5.0000 mg | Freq: Once | ORAL | Status: DC | PRN

## 2024-03-09 MED ORDER — DEXAMETHASONE SODIUM PHOSPHATE 10 MG/ML IJ SOLN
INTRAMUSCULAR | Status: DC | PRN
  Administered 2024-03-09: 5 mg via INTRAVENOUS

## 2024-03-09 MED ORDER — SODIUM CHLORIDE 0.9 % IV SOLN: INTRAVENOUS | Status: DC

## 2024-03-09 MED ORDER — PANTOPRAZOLE SODIUM 40 MG PO TBEC
40.0000 mg | DELAYED_RELEASE_TABLET | Freq: Every day | ORAL | Status: DC
  Administered 2024-03-09 – 2024-03-10 (×2): 40 mg via ORAL
  Filled 2024-03-09 (×2): qty 1

## 2024-03-09 MED ORDER — EPHEDRINE SULFATE-NACL 50-0.9 MG/10ML-% IV SOSY
PREFILLED_SYRINGE | INTRAVENOUS | Status: DC | PRN
  Administered 2024-03-09: 5 mg via INTRAVENOUS

## 2024-03-09 MED ORDER — METOCLOPRAMIDE HCL 5 MG PO TABS: 5.0000 mg | ORAL_TABLET | Freq: Three times a day (TID) | ORAL | Status: DC | PRN

## 2024-03-09 MED ORDER — STERILE WATER FOR IRRIGATION IR SOLN
Status: DC | PRN
  Administered 2024-03-09: 1000 mL

## 2024-03-09 MED ORDER — PHENYLEPHRINE 80 MCG/ML (10ML) SYRINGE FOR IV PUSH (FOR BLOOD PRESSURE SUPPORT)
PREFILLED_SYRINGE | INTRAVENOUS | Status: DC | PRN
  Administered 2024-03-09 (×2): 80 ug via INTRAVENOUS

## 2024-03-09 MED ORDER — POVIDONE-IODINE 10 % EX SWAB
2.0000 | Freq: Once | CUTANEOUS | Status: AC
  Administered 2024-03-09: 2 via TOPICAL

## 2024-03-09 MED ORDER — OXYCODONE HCL 5 MG PO TABS
5.0000 mg | ORAL_TABLET | ORAL | Status: DC | PRN
  Administered 2024-03-10: 5 mg via ORAL
  Filled 2024-03-09: qty 1

## 2024-03-09 MED ORDER — FENTANYL CITRATE (PF) 100 MCG/2ML IJ SOLN
INTRAMUSCULAR | Status: AC
  Administered 2024-03-09: 100 ug via INTRAVENOUS
  Filled 2024-03-09: qty 2

## 2024-03-09 MED ORDER — TRANEXAMIC ACID-NACL 1000-0.7 MG/100ML-% IV SOLN
1000.0000 mg | INTRAVENOUS | Status: AC
  Administered 2024-03-09: 1000 mg via INTRAVENOUS
  Filled 2024-03-09: qty 100

## 2024-03-09 MED ORDER — SODIUM CHLORIDE 0.9 % IR SOLN
Status: DC | PRN
  Administered 2024-03-09: 1000 mL

## 2024-03-09 MED ORDER — CEFAZOLIN SODIUM-DEXTROSE 2-4 GM/100ML-% IV SOLN
2.0000 g | INTRAVENOUS | Status: AC
  Administered 2024-03-09: 2 g via INTRAVENOUS
  Filled 2024-03-09: qty 100

## 2024-03-09 MED ORDER — PROPOFOL 500 MG/50ML IV EMUL
INTRAVENOUS | Status: DC | PRN
  Administered 2024-03-09: 100 ug/kg/min via INTRAVENOUS

## 2024-03-09 MED ORDER — METOCLOPRAMIDE HCL 5 MG/ML IJ SOLN: 5.0000 mg | Freq: Three times a day (TID) | INTRAMUSCULAR | Status: DC | PRN

## 2024-03-09 MED ORDER — DEXAMETHASONE SODIUM PHOSPHATE 10 MG/ML IJ SOLN
INTRAMUSCULAR | Status: AC
  Filled 2024-03-09: qty 1

## 2024-03-09 MED ORDER — ALUM & MAG HYDROXIDE-SIMETH 200-200-20 MG/5ML PO SUSP
30.0000 mL | ORAL | Status: DC | PRN
Start: 2024-03-09 — End: 2024-03-10

## 2024-03-09 MED ORDER — METHOCARBAMOL 500 MG PO TABS
500.0000 mg | ORAL_TABLET | Freq: Four times a day (QID) | ORAL | Status: DC | PRN
  Administered 2024-03-10: 500 mg via ORAL
  Filled 2024-03-09: qty 1

## 2024-03-09 MED ORDER — OXYCODONE HCL 5 MG PO TABS: 5.0000 mg | ORAL_TABLET | Freq: Once | ORAL | Status: DC | PRN

## 2024-03-09 MED ORDER — ONDANSETRON HCL 4 MG/2ML IJ SOLN: 4.0000 mg | Freq: Four times a day (QID) | INTRAMUSCULAR | Status: DC | PRN

## 2024-03-09 MED ORDER — LACTATED RINGERS IV SOLN: INTRAVENOUS | Status: DC

## 2024-03-09 MED ORDER — FENTANYL CITRATE (PF) 250 MCG/5ML IJ SOLN
INTRAMUSCULAR | Status: DC | PRN
  Administered 2024-03-09: 50 ug via INTRAVENOUS

## 2024-03-09 MED ORDER — ONDANSETRON HCL 4 MG PO TABS: 4.0000 mg | ORAL_TABLET | Freq: Four times a day (QID) | ORAL | Status: DC | PRN

## 2024-03-09 MED ORDER — AMLODIPINE BESYLATE 5 MG PO TABS
5.0000 mg | ORAL_TABLET | Freq: Every day | ORAL | Status: DC
  Administered 2024-03-10: 5 mg via ORAL
  Filled 2024-03-09: qty 1

## 2024-03-09 MED ORDER — ONDANSETRON HCL 4 MG/2ML IJ SOLN: 4.0000 mg | Freq: Once | INTRAMUSCULAR | Status: DC | PRN

## 2024-03-09 MED ORDER — BUPIVACAINE-EPINEPHRINE 0.25% -1:200000 IJ SOLN
INTRAMUSCULAR | Status: DC | PRN
  Administered 2024-03-09: 30 mL

## 2024-03-09 MED ORDER — PHENYLEPHRINE 80 MCG/ML (10ML) SYRINGE FOR IV PUSH (FOR BLOOD PRESSURE SUPPORT)
PREFILLED_SYRINGE | INTRAVENOUS | Status: AC
  Filled 2024-03-09: qty 10

## 2024-03-09 MED ORDER — EPHEDRINE 5 MG/ML INJ
INTRAVENOUS | Status: AC
  Filled 2024-03-09: qty 5

## 2024-03-09 MED ORDER — CELECOXIB 200 MG PO CAPS
200.0000 mg | ORAL_CAPSULE | Freq: Once | ORAL | Status: AC
  Administered 2024-03-09: 200 mg via ORAL
  Filled 2024-03-09: qty 1

## 2024-03-09 MED ORDER — ONDANSETRON HCL 4 MG/2ML IJ SOLN
INTRAMUSCULAR | Status: AC
Start: 2024-03-09 — End: ?
  Filled 2024-03-09: qty 2

## 2024-03-09 MED ORDER — 0.9 % SODIUM CHLORIDE (POUR BTL) OPTIME
TOPICAL | Status: DC | PRN
  Administered 2024-03-09: 1000 mL

## 2024-03-09 MED ORDER — FENTANYL CITRATE (PF) 100 MCG/2ML IJ SOLN: 100.0000 ug | Freq: Once | INTRAMUSCULAR | Status: AC

## 2024-03-09 MED ORDER — PHENYLEPHRINE HCL-NACL 20-0.9 MG/250ML-% IV SOLN
INTRAVENOUS | Status: DC | PRN
  Administered 2024-03-09: 20 ug/min via INTRAVENOUS

## 2024-03-09 MED ORDER — METHOCARBAMOL 1000 MG/10ML IJ SOLN: 500.0000 mg | Freq: Four times a day (QID) | INTRAMUSCULAR | Status: DC | PRN

## 2024-03-09 MED ORDER — CHLORHEXIDINE GLUCONATE 0.12 % MT SOLN
OROMUCOSAL | Status: AC
  Administered 2024-03-09: 15 mL
  Filled 2024-03-09: qty 15

## 2024-03-09 MED ORDER — MEPERIDINE HCL 25 MG/ML IJ SOLN: 6.2500 mg | INTRAMUSCULAR | Status: DC | PRN

## 2024-03-09 MED ORDER — DOCUSATE SODIUM 100 MG PO CAPS
100.0000 mg | ORAL_CAPSULE | Freq: Two times a day (BID) | ORAL | Status: DC
Start: 2024-03-09 — End: 2024-03-10
  Administered 2024-03-09 (×2): 100 mg via ORAL
  Filled 2024-03-09 (×2): qty 1

## 2024-03-09 MED ORDER — BUPIVACAINE-EPINEPHRINE (PF) 0.5% -1:200000 IJ SOLN
INTRAMUSCULAR | Status: DC | PRN
  Administered 2024-03-09: 20 mL via PERINEURAL

## 2024-03-09 MED ORDER — ACETAMINOPHEN 325 MG PO TABS
325.0000 mg | ORAL_TABLET | Freq: Four times a day (QID) | ORAL | Status: DC | PRN
  Administered 2024-03-10: 650 mg via ORAL
  Filled 2024-03-09: qty 2

## 2024-03-09 SURGICAL SUPPLY — 61 items
BAG COUNTER SPONGE SURGICOUNT (BAG) ×1 IMPLANT
BANDAGE ESMARK 6X9 LF (GAUZE/BANDAGES/DRESSINGS) ×1 IMPLANT
BLADE SAG 18X100X1.27 (BLADE) ×1 IMPLANT
BNDG COHESIVE 4X5 TAN STRL LF (GAUZE/BANDAGES/DRESSINGS) IMPLANT
BNDG COHESIVE 6X5 TAN NS LF (GAUZE/BANDAGES/DRESSINGS) IMPLANT
BNDG ELASTIC 6INX 5YD STR LF (GAUZE/BANDAGES/DRESSINGS) ×2 IMPLANT
BOWL SMART MIX CTS (DISPOSABLE) IMPLANT
CEMENT BONE R 1X40 (Cement) IMPLANT
COMPONENT FEM CMT PRSONA SZ7RT (Joint) IMPLANT
COMPONET TIB PS KNEE E 0D RT (Joint) IMPLANT
COOLER ICEMAN CLASSIC (MISCELLANEOUS) ×1 IMPLANT
COVER SURGICAL LIGHT HANDLE (MISCELLANEOUS) ×1 IMPLANT
CUFF TOURN SGL QUICK 42 (TOURNIQUET CUFF) IMPLANT
CUFF TRNQT CYL 34X4.125X (TOURNIQUET CUFF) ×1 IMPLANT
DRAPE EXTREMITY T 121X128X90 (DISPOSABLE) ×1 IMPLANT
DRAPE HALF SHEET 40X57 (DRAPES) ×1 IMPLANT
DRAPE U-SHAPE 47X51 STRL (DRAPES) ×1 IMPLANT
DRSG XEROFORM 1X8 (GAUZE/BANDAGES/DRESSINGS) IMPLANT
DURAPREP 26ML APPLICATOR (WOUND CARE) ×1 IMPLANT
ELECT CAUTERY BLADE 6.4 (BLADE) ×1 IMPLANT
ELECTRODE BLDE 4.0 EZ CLN MEGD (MISCELLANEOUS) IMPLANT
ELECTRODE REM PT RTRN 9FT ADLT (ELECTROSURGICAL) ×1 IMPLANT
FACESHIELD WRAPAROUND (MASK) ×3 IMPLANT
FACESHIELD WRAPAROUND OR TEAM (MASK) ×2 IMPLANT
GAUZE PAD ABD 8X10 STRL (GAUZE/BANDAGES/DRESSINGS) ×1 IMPLANT
GAUZE SPONGE 4X4 12PLY STRL (GAUZE/BANDAGES/DRESSINGS) ×1 IMPLANT
GAUZE XEROFORM 1X8 LF (GAUZE/BANDAGES/DRESSINGS) ×1 IMPLANT
GLOVE BIOGEL PI IND STRL 8 (GLOVE) ×2 IMPLANT
GLOVE ORTHO TXT STRL SZ7.5 (GLOVE) ×1 IMPLANT
GLOVE SURG ORTHO 8.0 STRL STRW (GLOVE) ×1 IMPLANT
GOWN STRL REUS W/ TWL LRG LVL3 (GOWN DISPOSABLE) IMPLANT
GOWN STRL REUS W/ TWL XL LVL3 (GOWN DISPOSABLE) ×2 IMPLANT
IMMOBILIZER KNEE 22 UNIV (SOFTGOODS) ×1 IMPLANT
INSERT TIB AS PERS SZ 6-7 14 (Insert) IMPLANT
IV NS 1000ML BAXH (IV SOLUTION) ×1 IMPLANT
KIT BASIN OR (CUSTOM PROCEDURE TRAY) ×1 IMPLANT
KIT TURNOVER KIT B (KITS) ×1 IMPLANT
MANIFOLD NEPTUNE II (INSTRUMENTS) ×1 IMPLANT
NDL 18GX1X1/2 (RX/OR ONLY) (NEEDLE) IMPLANT
NEEDLE 18GX1X1/2 (RX/OR ONLY) (NEEDLE) ×1 IMPLANT
NS IRRIG 1000ML POUR BTL (IV SOLUTION) ×1 IMPLANT
PACK TOTAL JOINT (CUSTOM PROCEDURE TRAY) ×1 IMPLANT
PAD ARMBOARD POSITIONER FOAM (MISCELLANEOUS) ×1 IMPLANT
PAD COLD SHLDR WRAP-ON (PAD) ×1 IMPLANT
PADDING CAST COTTON 6X4 STRL (CAST SUPPLIES) ×1 IMPLANT
PENCIL BUTTON HOLSTER BLD 10FT (ELECTRODE) IMPLANT
PIN DRILL HDLS TROCAR 75 4PK (PIN) IMPLANT
SCREW FEMALE HEX FIX 25X2.5 (ORTHOPEDIC DISPOSABLE SUPPLIES) IMPLANT
SET HNDPC FAN SPRY TIP SCT (DISPOSABLE) ×1 IMPLANT
SET PAD KNEE POSITIONER (MISCELLANEOUS) ×1 IMPLANT
STAPLER SKIN PROX 35W (STAPLE) ×1 IMPLANT
STEM POLY PAT PLY 29M KNEE (Knees) IMPLANT
SUCTION TUBE FRAZIER 10FR DISP (SUCTIONS) ×1 IMPLANT
SUT VIC AB 0 CT1 27XBRD ANBCTR (SUTURE) ×1 IMPLANT
SUT VIC AB 1 CT1 27XBRD ANBCTR (SUTURE) ×2 IMPLANT
SUT VIC AB 2-0 CT1 TAPERPNT 27 (SUTURE) ×2 IMPLANT
SYR 50ML LL SCALE MARK (SYRINGE) IMPLANT
TOWEL GREEN STERILE (TOWEL DISPOSABLE) ×1 IMPLANT
TOWEL GREEN STERILE FF (TOWEL DISPOSABLE) ×1 IMPLANT
TRAY CATH INTERMITTENT SS 16FR (CATHETERS) IMPLANT
TRAY FOLEY MTR SLVR 14FR STAT (SET/KITS/TRAYS/PACK) IMPLANT

## 2024-03-09 NOTE — Op Note (Signed)
 Operative Note  Date of operation: 03/09/2024 Preoperative diagnosis: Right knee primary osteoarthritis Postoperative diagnosis: Same  Procedure: Right cemented total knee arthroplasty  Implants: Biomet/Zimmer persona cemented knee system Implant Name Type Inv. Item Serial No. Manufacturer Lot No. LRB No. Used Action  CEMENT BONE R 1X40 - [id number] Cement CEMENT BONE R 1X40  ZIMMER [employer](ORTH,TRAU,BIO,SG) [id number] Right 2 Implanted  COMPONENT FEM CMT PRSONA [id number] - [id number] Joint COMPONENT FEM CMT PRSONA [id number]  ZIMMER [employer]ON(ORTH,TRAU,BIO,SG) [id number] Right 1 Implanted  STEM POLY PAT PLY 62M KNEE - [id number] Knees STEM POLY PAT PLY 62M KNEE  ZIMMER RECON(ORTH,TRAU,BIO,SG) [id number] Right 1 Implanted  INSERT TIB AS PERS SZ 6-7 14 - [id number] Insert INSERT TIB AS PERS SZ 6-7 14  ZIMMER [employer]ON(ORTH,TRAU,BIO,SG) [id number] Right 1 Implanted  COMPONET TIB PS KNEE E 0D RT - [id number] Joint COMPONET TIB PS KNEE E 0D RT  ZIMMER [employer]ON(ORTH,TRAU,BIO,SG) [id number] Right 1 Implanted   Surgeon: Jeanella Milan. Lucienne Ryder, MD Assistant: Malena Scull, PA-C  Anesthesia: Right lower extremity adductor canal block, spinal, local Tourniquet time: Less than 1 hour Antibiotics: IV Ancef  EBL: Less than 50 cc Complications: None  Indications: The patient is a 78 year old female with debilitating arthritis involving her right knee.  She has varus malalignment that knee and bone-on-bone wear on x-rays.  She has tried and failed conservative treatment for over a year now.  Her right knee pain is daily and it is detrimentally affecting her mobility, her quality of life and her actives daily living.  We have successfully replaced her left knee a year or 2 ago and that is done very well.  Having had knee replacement surgery before she is fully aware of the risks of acute blood loss anemia, nerve vessel injury, fracture, infection, DVT and implant failure.  She understands that our goals are hopefully decreased pain,  improved mobility and improved quality of life.  Procedure scription: After informed consent was obtained and the appropriate right knee was marked, anesthesia obtained a right lower extremity adductor canal block in the holding room.  The patient was then brought to the operating room and set up on the operating table where spinal anesthesia was obtained.  She was laid in supine position on the operating table and a Foley catheter was placed.  A nonsterile tourniquet was placed around her upper right thigh and her right thigh, knee, leg, ankle and foot were prepped and draped with DuraPrep and sterile drapes.  A timeout was called and she was identified as the correct patient and the correct right knee.  With the knee slightly flexed an Esmarch was used to wrap out the leg and the tourniquet was inflated to 300 mm of pressure.  With the knee extended a direct midline incision was made over the patella and carried proximally distally.  Dissection was carried down to the knee joint and a medial parapatellar arthrotomy was made finding a moderate joint effusion.  With the knee in a flexed position we removed osteophytes in all 3 compartments and there was significant cartilage wear throughout the knee.  We removed the ACL as well as medial and lateral meniscus.  We then used an extramedullary cutting guide for making our proximal tibia cut correcting for varus and valgus and a 7 degree slope.  We made this cut to take 2 mm off the low side and we did back this down to more millimeters.  We then used an intramedullary based cutting guide for our distal femur cut setting this for a  right knee at 5 degrees externally rotated and a 10 mm distal femoral cut.  We made that cut without difficulty and brought the knee back down to full extension and at achieve full extension with a 10 mm block.  We then backed the femur and put a femoral sizing guide based off the epicondylar axis and Whitesides line.  Based off of this we  chose a size 7 femur.  We put a 4-in-1 cutting block for a size 7 femur and made our anterior and posterior cuts followed by her chamfer cuts.  We then backed the tibia and chose a size E tibial tray for right knee will good coverage over the tibial plateau setting the rotation of the tibial tubercle and the femur.  We did our drill hole and keel punch off of this.  We then trialed our size E right tibia combined with our size 7 right CR standard femur.  We trialed up to a 14 mm medial congruent right polythene insert and we are pleased with that 14 mm thickness insert in terms of range of motion and stability.  We then made a patella cut and drilled 3 holes for a size 29 patella button.  Again with all trial instrumentation in the knee we are pleased with range of motion and stability.  We then removed all transportation for the knee and irrigated the knee with normal saline solution.  We then placed Marcaine  with epinephrine  around the arthrotomy.  Next the cement was mixed and with the knee in a flexed position we cemented our Biomet/Zimmer persona tibial tray for right knee size E followed by cementing our size 7 right CR standard femur.  We then placed our 14 mm thickness right medial congruent polythene insert and cemented our size 29 patella button.  We then held the knee compressed and extended to allow the cemented hardened.  We removed excess cement debris from the knee.  Once the cemented hardened we replaced the range of motion and stability.  The tourniquet was then let down and hemostasis was obtained with electrocautery.  The arthrotomy was closed with interrupted #1 Vicryl suture followed by 0 Vicryl to close deep tissue and 2-0 Vicryl to close subcutaneous tissue.  The skin was closed with staples.  Well-padded sterile dressing was applied.  The patient was taken the recovery room.  Malena Scull, PA-C did assist in the entire case and beginning in his assistance was critical and medically necessary for  soft tissue management and retraction, helping guide implant placement and a layered closure of the wound.

## 2024-03-09 NOTE — Interval H&P Note (Signed)
 History and Physical Interval Note: The patient understands that she is here today for a right total knee replacement to treat her severe right knee arthritis and pain.  There has been no acute or interval change in her medical status.  The risks and benefits of surgery have been discussed in detail and informed consent has been obtained.  The right operative knee has been marked.  03/09/2024 7:21 AM  Kelly Dixon  has presented today for surgery, with the diagnosis of osteoarthritis right knee.  The various methods of treatment have been discussed with the patient and family. After consideration of risks, benefits and other options for treatment, the patient has consented to  Procedure(s): ARTHROPLASTY, KNEE, TOTAL (Right) as a surgical intervention.  The patient's history has been reviewed, patient examined, no change in status, stable for surgery.  I have reviewed the patient's chart and labs.  Questions were answered to the patient's satisfaction.     Arnie Lao

## 2024-03-09 NOTE — Discharge Instructions (Signed)
 Per [facility] clinic policy, our goal is ensure optimal postoperative pain control with a multimodal pain management strategy. For all [facility] patients, our goal is to wean post-operative narcotic medications by 6 weeks post-operatively. If this is not possible due to utilization of pain medication prior to surgery, your [facility] doctor will support your acute post-operative pain control for the first 6 weeks postoperatively, with a plan to transition you back to your primary pain team following that. Kelly Dixon will work to ensure a Therapist, occupational.  INSTRUCTIONS AFTER JOINT REPLACEMENT   Remove items at home which could result in a fall. This includes throw rugs or furniture in walking pathways ICE to the affected joint every three hours while awake for 30 minutes at a time, for at least the first 3-5 days, and then as needed for pain and swelling.  Continue to use ice for pain and swelling. You may notice swelling that will progress down to the foot and ankle.  This is normal after surgery.  Elevate your leg when you are not up walking on it.   Continue to use the breathing machine you got in the hospital (incentive spirometer) which will help keep your temperature down.  It is common for your temperature to cycle up and down following surgery, especially at night when you are not up moving around and exerting yourself.  The breathing machine keeps your lungs expanded and your temperature down.   DIET:  As you were doing prior to hospitalization, we recommend a well-balanced diet.  DRESSING / WOUND CARE / SHOWERING  Keep the surgical dressing until follow up.  The dressing is water proof, so you can shower without any extra covering.  IF THE DRESSING FALLS OFF or the wound gets wet inside, change the dressing with sterile gauze.  Please use good hand washing techniques before changing the dressing.  Do not use any lotions or creams on the incision until instructed by your surgeon.     ACTIVITY  Increase activity slowly as tolerated, but follow the weight bearing instructions below.   No driving for 6 weeks or until further direction given by your physician.  You cannot drive while taking narcotics.  No lifting or carrying greater than 10 lbs. until further directed by your surgeon. Avoid periods of inactivity such as sitting longer than an hour when not asleep. This helps prevent blood clots.  You may return to work once you are authorized by your doctor.     WEIGHT BEARING   Weight bearing as tolerated with assist device (walker, cane, etc) as directed, use it as long as suggested by your surgeon or therapist, typically at least 4-6 weeks.   EXERCISES  Results after joint replacement surgery are often greatly improved when you follow the exercise, range of motion and muscle strengthening exercises prescribed by your doctor. Safety measures are also important to protect the joint from further injury. Any time any of these exercises cause you to have increased pain or swelling, decrease what you are doing until you are comfortable again and then slowly increase them. If you have problems or questions, call your caregiver or physical therapist for advice.   Rehabilitation is important following a joint replacement. After just a few days of immobilization, the muscles of the leg can become weakened and shrink (atrophy).  These exercises are designed to build up the tone and strength of the thigh and leg muscles and to improve motion. Often times heat used for twenty to thirty minutes before  working out will loosen up your tissues and help with improving the range of motion but do not use heat for the first two weeks following surgery (sometimes heat can increase post-operative swelling).   These exercises can be done on a training (exercise) mat, on the floor, on a table or on a bed. Use whatever works the best and is most comfortable for you.    Use music or television  while you are exercising so that the exercises are a pleasant break in your day. This will make your life better with the exercises acting as a break in your routine that you can look forward to.   Perform all exercises about fifteen times, three times per day or as directed.  You should exercise both the operative leg and the other leg as well.  Exercises include:   Quad Sets - Tighten up the muscle on the front of the thigh (Quad) and hold for 5-10 seconds.   Straight Leg Raises - With your knee straight (if you were given a brace, keep it on), lift the leg to 60 degrees, hold for 3 seconds, and slowly lower the leg.  Perform this exercise against resistance later as your leg gets stronger.  Leg Slides: Lying on your back, slowly slide your foot toward your buttocks, bending your knee up off the floor (only go as far as is comfortable). Then slowly slide your foot back down until your leg is flat on the floor again.  Angel Wings: Lying on your back spread your legs to the side as far apart as you can without causing discomfort.  Hamstring Strength:  Lying on your back, push your heel against the floor with your leg straight by tightening up the muscles of your buttocks.  Repeat, but this time bend your knee to a comfortable angle, and push your heel against the floor.  You may put a pillow under the heel to make it more comfortable if necessary.   A rehabilitation program following joint replacement surgery can speed recovery and prevent re-injury in the future due to weakened muscles. Contact your doctor or a physical therapist for more information on knee rehabilitation.    CONSTIPATION  Constipation is defined medically as fewer than three stools per week and severe constipation as less than one stool per week.  Even if you have a regular bowel pattern at home, your normal regimen is likely to be disrupted due to multiple reasons following surgery.  Combination of anesthesia, postoperative  narcotics, change in appetite and fluid intake all can affect your bowels.   YOU MUST use at least one of the following options; they are listed in order of increasing strength to get the job done.  They are all available over the counter, and you may need to use some, POSSIBLY even all of these options:    Drink plenty of fluids (prune juice may be helpful) and high fiber foods Colace 100 mg by mouth twice a day  Senokot for constipation as directed and as needed Dulcolax (bisacodyl), take with full glass of water  Miralax (polyethylene glycol) once or twice a day as needed.  If you have tried all these things and are unable to have a bowel movement in the first 3-4 days after surgery call either your surgeon or your primary doctor.    If you experience loose stools or diarrhea, hold the medications until you stool forms back up.  If your symptoms do not get better within 1 week  or if they get worse, check with your doctor.  If you experience "the worst abdominal pain ever" or develop nausea or vomiting, please contact the office immediately for further recommendations for treatment.   ITCHING:  If you experience itching with your medications, try taking only a single pain pill, or even half a pain pill at a time.  You can also use Benadryl over the counter for itching or also to help with sleep.   TED HOSE STOCKINGS:  Use stockings on both legs until for at least 2 weeks or as directed by physician office. They may be removed at night for sleeping.  MEDICATIONS:  See your medication summary on the "After Visit Summary" that nursing will review with you.  You may have some home medications which will be placed on hold until you complete the course of blood thinner medication.  It is important for you to complete the blood thinner medication as prescribed.  PRECAUTIONS:  If you experience chest pain or shortness of breath - call 911 immediately for transfer to the hospital emergency department.    If you develop a fever greater that 101 F, purulent drainage from wound, increased redness or drainage from wound, foul odor from the wound/dressing, or calf pain - CONTACT YOUR SURGEON.                                                   FOLLOW-UP APPOINTMENTS:  If you do not already have a post-op appointment, please call the office for an appointment to be seen by your surgeon.  Guidelines for how soon to be seen are listed in your "After Visit Summary", but are typically between 1-4 weeks after surgery.  OTHER INSTRUCTIONS:   Knee Replacement:  Do not place pillow under knee, focus on keeping the knee straight while resting. CPM instructions: 0-90 degrees, 2 hours in the morning, 2 hours in the afternoon, and 2 hours in the evening. Place foam block, curve side up under heel at all times except when in CPM or when walking.  DO NOT modify, tear, cut, or change the foam block in any way.  POST-OPERATIVE OPIOID TAPER INSTRUCTIONS: It is important to wean off of your opioid medication as soon as possible. If you do not need pain medication after your surgery it is ok to stop day one. Opioids include: Codeine, Hydrocodone(Norco, Vicodin), Oxycodone(Percocet, oxycontin) and hydromorphone amongst others.  Long term and even short term use of opiods can cause: Increased pain response Dependence Constipation Depression Respiratory depression And more.  Withdrawal symptoms can include Flu like symptoms Nausea, vomiting And more Techniques to manage these symptoms Hydrate well Eat regular healthy meals Stay active Use relaxation techniques(deep breathing, meditating, yoga) Do Not substitute Alcohol to help with tapering If you have been on opioids for less than two weeks and do not have pain than it is ok to stop all together.  Plan to wean off of opioids This plan should start within one week post op of your joint replacement. Maintain the same interval or time between taking each dose  and first decrease the dose.  Cut the total daily intake of opioids by one tablet each day Next start to increase the time between doses. The last dose that should be eliminated is the evening dose.   MAKE SURE YOU:  Understand these instructions.  Get help right away if you are not doing well or get worse.    Thank you for letting us be a part of your medical care team.  It is a privilege we respect greatly.  We hope these instructions will help you stay on track for a fast and full recovery!      Dental Antibiotics:  In most cases prophylactic antibiotics for Dental procdeures after total joint surgery are not necessary.  Exceptions are as follows:  1. History of prior total joint infection  2. Severely immunocompromised (Organ Transplant, cancer chemotherapy, Rheumatoid biologic meds such as Humera)  3. Poorly controlled diabetes (A1C &gt; 8.0, blood glucose over 200)  If you have one of these conditions, contact your surgeon for an antibiotic prescription, prior to your dental procedure.

## 2024-03-09 NOTE — Evaluation (Signed)
 Physical Therapy Evaluation Patient Details Name: Kelly Dixon MRN: [id number] DOB: Jul 05, 1946 Today's Date: 03/09/2024  History of Present Illness  Patient is a 78 y/o female admitted 03/09/24 for R TKA.  PMH positive for Ltka, L breast CA, SBO, HTN.  Clinical Impression  Patient presents with decreased mobility due to pain, decreased strength and ROM R knee.  Currently needing CGA overall for mobility including hallway ambulation with RW.  Previously patient independent at home though niece will be able to assist at d/c.  PT will continue to follow and has follow up PT setup per MD.         If plan is discharge home, recommend the following: A little help with walking and/or transfers;A little help with bathing/dressing/bathroom;Help with stairs or ramp for entrance;Assist for transportation;Assistance with cooking/housework   Can travel by private vehicle        Equipment Recommendations None recommended by PT  Recommendations for Other Services       Functional Status Assessment Patient has had a recent decline in their functional status and demonstrates the ability to make significant improvements in function in a reasonable and predictable amount of time.     Precautions / Restrictions Precautions Precautions: Fall Recall of Precautions/Restrictions: Intact Required Braces or Orthoses: Knee Immobilizer - Right Restrictions Weight Bearing Restrictions Per Provider Order: No Other Position/Activity Restrictions: WBAT R LE      Mobility  Bed Mobility Overal bed mobility: Needs Assistance Bed Mobility: Supine to Sit     Supine to sit: Contact guard     General bed mobility comments: for balance and safety    Transfers Overall transfer level: Needs assistance Equipment used: Rolling walker (2 wheels) Transfers: Sit to/from Stand Sit to Stand: Contact guard assist           General transfer comment: for balance    Ambulation/Gait Ambulation/Gait  assistance: Supervision, Contact guard assist Gait Distance (Feet): 100 Feet Assistive device: Rolling walker (2 wheels) Gait Pattern/deviations: Step-to pattern, Step-through pattern, Antalgic, Decreased stride length       General Gait Details: decreased stance time on R though reports not buckling despite not using knee immobilizer.  Patient admits to pain, though feels good walking  Stairs            Wheelchair Mobility     Tilt Bed    Modified Rankin (Stroke Patients Only)       Balance Overall balance assessment: Needs assistance   Sitting balance-Leahy Scale: Good     Standing balance support: No upper extremity supported, During functional activity Standing balance-Leahy Scale: Fair Standing balance comment: during transition to sit in chair hands unsupported                             Pertinent Vitals/Pain Pain Assessment Pain Assessment: Faces Faces Pain Scale: Hurts a little bit Pain Location: R knee with ROM Pain Descriptors / Indicators: Tender Pain Intervention(s): Monitored during session, Repositioned, Ice applied    Home Living Family/patient expects to be discharged to:: Private residence Living Arrangements: Other (Comment) (NEICE) Available Help at Discharge: Family Type of Home: House Home Access: Level entry       Home Layout: One level Home Equipment: Pharmacist, hospital (2 wheels);BSC/3in1      Prior Function Prior Level of Function : Independent/Modified Independent             Mobility Comments: working as Lawyer ADLs Comments:  drives     Extremity/Trunk Assessment   Upper Extremity Assessment Upper Extremity Assessment: Overall WFL for tasks assessed    Lower Extremity Assessment Lower Extremity Assessment: RLE deficits/detail RLE Deficits / Details: AROM knee grossly 0-40 degrees with ace wrap and dressing on knee, able to perform SLR unaided, sensation WNL       Communication    Communication Communication: No apparent difficulties    Cognition Arousal: Alert Behavior During Therapy: WFL for tasks assessed/performed   PT - Cognitive impairments: No apparent impairments                         Following commands: Intact       Cueing       General Comments      Exercises     Assessment/Plan    PT Assessment Patient needs continued PT services  PT Problem List Decreased strength;Decreased range of motion;Decreased activity tolerance;Decreased balance;Decreased mobility;Pain;Decreased knowledge of use of DME       PT Treatment Interventions DME instruction;Gait training;Patient/family education;Functional mobility training;Therapeutic activities;Therapeutic exercise;Balance training    PT Goals (Current goals can be found in the Care Plan section)  Acute Rehab PT Goals Patient Stated Goal: return to independent PT Goal Formulation: With patient Time For Goal Achievement: 03/16/24 Potential to Achieve Goals: Good    Frequency 7X/week     Co-evaluation               AM-PAC PT "6 Clicks" Mobility  Outcome Measure Help needed turning from your back to your side while in a flat bed without using bedrails?: A Little Help needed moving from lying on your back to sitting on the side of a flat bed without using bedrails?: A Little Help needed moving to and from a bed to a chair (including a wheelchair)?: A Little Help needed standing up from a chair using your arms (e.g., wheelchair or bedside chair)?: A Little Help needed to walk in hospital room?: A Little Help needed climbing 3-5 steps with a railing? : Total 6 Click Score: 16    End of Session Equipment Utilized During Treatment: Gait belt Activity Tolerance: Patient tolerated treatment well Patient left: in chair;with call bell/phone within reach   PT Visit Diagnosis: Difficulty in walking, not elsewhere classified (R26.2);Pain Pain - Right/Left: Right Pain - part of  body: Knee    Time: 1610-9604 PT Time Calculation (min) (ACUTE ONLY): 25 min   Charges:   PT Evaluation $PT Eval Low Complexity: 1 Low PT Treatments $Gait Training: 8-22 mins PT General Charges $$ ACUTE PT VISIT: 1 Visit         Abigail Hoff, PT Acute Rehabilitation Services Office:[phone] 03/09/2024   Kelly Dixon 03/09/2024, 5:47 PM

## 2024-03-09 NOTE — Anesthesia Procedure Notes (Signed)
 Anesthesia Regional Block: Adductor canal block   Pre-Anesthetic Checklist: , timeout performed,  Correct Patient, Correct Site, Correct Laterality,  Correct Procedure, Correct Position, site marked,  Risks and benefits discussed,  Surgical consent,  Pre-op evaluation,  At surgeon's request and post-op pain management  Laterality: Right  Prep: chloraprep       Needles:  Injection technique: Single-shot  Needle Type: Echogenic Stimulator Needle     Needle Length: 5cm  Needle Gauge: 22     Additional Needles:   Procedures:, nerve stimulator,,, ultrasound used (permanent image in chart),,    Narrative:  Start time: 03/09/2024 8:10 AM End time: 03/09/2024 8:15 AM Injection made incrementally with aspirations every 5 mL.  Performed by: Personally  Anesthesiologist: Rhenda Cedars, MD  Additional Notes: Functioning IV was confirmed and monitors were applied.  A 50mm 22ga Arrow echogenic stimulator needle was used. Sterile prep and drape,hand hygiene and sterile gloves were used. Ultrasound guidance: relevant anatomy identified, needle position confirmed, local anesthetic spread visualized around nerve(s)., vascular puncture avoided.  Image printed for medical record. Negative aspiration and negative test dose prior to incremental administration of local anesthetic. The patient tolerated the procedure well.

## 2024-03-09 NOTE — Transfer of Care (Signed)
 Immediate Anesthesia Transfer of Care Note  Patient: Kelly Dixon  Procedure(s) Performed: ARTHROPLASTY, KNEE, TOTAL (Right: Knee)  Patient Location: PACU  Anesthesia Type:Spinal  Level of Consciousness: awake, alert , and oriented  Airway & Oxygen Therapy: Patient Spontanous Breathing  Post-op Assessment: Report given to RN and Post -op Vital signs reviewed and stable  Post vital signs: Reviewed and stable  Last Vitals:  Vitals Value Taken Time  BP 108/70 03/09/24 1046  Temp 36.4 C 03/09/24 1045  Pulse 78 03/09/24 1052  Resp 16 03/09/24 1052  SpO2 95 % 03/09/24 1052  Vitals shown include unfiled device data.  Last Pain:  Vitals:   03/09/24 0806  TempSrc:   PainSc: 5       Patients Stated Pain Goal: 2 (03/09/24 0737)  Complications: No notable events documented.

## 2024-03-09 NOTE — Anesthesia Procedure Notes (Signed)
 Spinal  Patient location during procedure: OR Start time: 03/09/2024 8:52 AM End time: 03/09/2024 8:56 AM Reason for block: surgical anesthesia Staffing Anesthesiologist: Rhenda Cedars, MD Performed by: Rhenda Cedars, MD Authorized by: Rhenda Cedars, MD   Preanesthetic Checklist Completed: patient identified, IV checked, site marked, risks and benefits discussed, surgical consent, monitors and equipment checked, pre-op evaluation and timeout performed Spinal Block Patient position: sitting Prep: DuraPrep Patient monitoring: heart rate, cardiac monitor, continuous pulse ox and blood pressure Approach: midline Location: L4-5 Injection technique: single-shot Needle Needle type: Sprotte  Needle gauge: 24 G Needle length: 9 cm Assessment Sensory level: T4 Events: CSF return

## 2024-03-10 ENCOUNTER — Encounter (HOSPITAL_COMMUNITY): Payer: Self-pay | Admitting: Orthopaedic Surgery

## 2024-03-10 ENCOUNTER — Telehealth: Payer: Self-pay

## 2024-03-10 DIAGNOSIS — Z87891 Personal history of nicotine dependence: Secondary | ICD-10-CM | POA: Diagnosis not present

## 2024-03-10 DIAGNOSIS — R112 Nausea with vomiting, unspecified: Secondary | ICD-10-CM | POA: Diagnosis not present

## 2024-03-10 DIAGNOSIS — I10 Essential (primary) hypertension: Secondary | ICD-10-CM | POA: Diagnosis not present

## 2024-03-10 DIAGNOSIS — M1711 Unilateral primary osteoarthritis, right knee: Secondary | ICD-10-CM | POA: Diagnosis not present

## 2024-03-10 LAB — CBC
HCT: 33.9 % — ABNORMAL LOW (ref 36.0–46.0)
Hemoglobin: 11.3 g/dL — ABNORMAL LOW (ref 12.0–15.0)
MCH: 31.4 pg (ref 26.0–34.0)
MCHC: 33.3 g/dL (ref 30.0–36.0)
MCV: 94.2 fL (ref 80.0–100.0)
Platelets: 187 10*3/uL (ref 150–400)
RBC: 3.6 MIL/uL — ABNORMAL LOW (ref 3.87–5.11)
RDW: 11.9 % (ref 11.5–15.5)
WBC: 9.8 10*3/uL (ref 4.0–10.5)
nRBC: 0 % (ref 0.0–0.2)

## 2024-03-10 LAB — BASIC METABOLIC PANEL WITH GFR
Anion gap: 11 (ref 5–15)
BUN: 10 mg/dL (ref 8–23)
CO2: 22 mmol/L (ref 22–32)
Calcium: 8.7 mg/dL — ABNORMAL LOW (ref 8.9–10.3)
Chloride: 107 mmol/L (ref 98–111)
Creatinine, Ser: 0.71 mg/dL (ref 0.44–1.00)
GFR, Estimated: >60 mL/min (ref >60)
Glucose, Bld: 136 mg/dL — ABNORMAL HIGH (ref 70–99)
Potassium: 4.3 mmol/L (ref 3.5–5.1)
Sodium: 140 mmol/L (ref 135–145)

## 2024-03-10 MED ORDER — HYDROCODONE-ACETAMINOPHEN 5-325 MG PO TABS: 1.0000 | ORAL_TABLET | Freq: Four times a day (QID) | ORAL | 0 refills | Status: DC | PRN

## 2024-03-10 NOTE — Progress Notes (Signed)
 Subjective: 1 Day Post-Op Procedure(s) (LRB): ARTHROPLASTY, KNEE, TOTAL (Right) Patient reports pain as mild.    Objective: Vital signs in last 24 hours: Temp:  [97.6 F (36.4 C)-98.5 F (36.9 C)] 97.6 F (36.4 C) (06/11 0430) Pulse Rate:  [63-85] 63 (06/11 0430) Resp:  [13-19] 16 (06/11 0430) BP: (97-150)/(60-85) 125/60 (06/11 0430) SpO2:  [93 %-100 %] 100 % (06/11 0430)  Intake/Output from previous day: 06/10 0701 - 06/11 0700 In: 1000 [I.V.:1000] Out: 3650 [Urine:3550; Blood:100] Intake/Output this shift: No intake/output data recorded.  Recent Labs    03/10/24 0554  HGB 11.3*   Recent Labs    03/10/24 0554  WBC 9.8  RBC 3.60*  HCT 33.9*  PLT 187   No results for input(s): NA, K, CL, CO2, BUN, CREATININE, GLUCOSE, CALCIUM  in the last 72 hours. No results for input(s): LABPT, INR in the last 72 hours.  Neurovascular intact Sensation intact distally Intact pulses distally Dorsiflexion/Plantar flexion intact Incision: dressing C/D/I Compartment soft   Assessment/Plan: 1 Day Post-Op Procedure(s) (LRB): ARTHROPLASTY, KNEE, TOTAL (Right) Up with therapy Discharge home with home health today after PT session      Arnie Lao 03/10/2024, 7:30 AM

## 2024-03-10 NOTE — Plan of Care (Signed)

## 2024-03-10 NOTE — Discharge Summary (Signed)
 Patient ID: Kelly Dixon MRN: [id number] DOB/AGE: Dec 25, 1945 78 y.o.  Admit date: 03/09/2024 Discharge date: 03/10/2024  Admission Diagnoses:  Principal Problem:   Unilateral primary osteoarthritis, right knee Active Problems:   Status post total right knee replacement   Discharge Diagnoses:  Same  Past Medical History:  Diagnosis Date   Allergy 1996   Anemia    Arthritis    Breast cancer (HCC) 2020   right breast IDC   Breast cancer (HCC) 10/2023   left breast ILC   Cataract May 2018   Gallstones 05/31/2018   Hypertension    Personal history of radiation therapy    PONV (postoperative nausea and vomiting)    Syphilis     Surgeries: Procedure(s): ARTHROPLASTY, KNEE, TOTAL on 03/09/2024   Consultants:   Discharged Condition: Improved  Hospital Course: SHARMILA WROBLESKI is an 78 y.o. female who was admitted 03/09/2024 for operative treatment ofUnilateral primary osteoarthritis, right knee. Patient has severe unremitting pain that affects sleep, daily activities, and work/hobbies. After pre-op clearance the patient was taken to the operating room on 03/09/2024 and underwent  Procedure(s): ARTHROPLASTY, KNEE, TOTAL.    Patient was given perioperative antibiotics:  Anti-infectives (From admission, onward)    Start     Dose/Rate Route Frequency Ordered Stop   03/09/24 1400  ceFAZolin  (ANCEF ) IVPB 2g/100 mL premix        2 g 200 mL/hr over 30 Minutes Intravenous Every 6 hours 03/09/24 1305 03/10/24 1009   03/09/24 0730  ceFAZolin  (ANCEF ) IVPB 2g/100 mL premix        2 g 200 mL/hr over 30 Minutes Intravenous On call to O.R. 03/09/24 0713 03/09/24 0900        Patient was given sequential compression devices, early ambulation, and chemoprophylaxis to prevent DVT.  Patient benefited maximally from hospital stay and there were no complications.    Recent vital signs: Patient Vitals for the past 24 hrs:  BP Temp Temp src Pulse Resp SpO2  03/10/24 0804 94/61 98.2 F (36.8  C) -- 73 16 100 %  03/10/24 0430 125/60 97.6 F (36.4 C) Oral 63 16 100 %  03/09/24 2100 119/69 98.1 F (36.7 C) Oral 73 16 97 %  03/09/24 1529 132/66 98.5 F (36.9 C) -- 85 16 98 %  03/09/24 1314 135/69 97.6 F (36.4 C) -- 63 16 98 %  03/09/24 1245 124/69 97.6 F (36.4 C) -- 72 18 94 %  03/09/24 1230 112/66 -- -- 69 19 95 %  03/09/24 1215 125/65 -- -- 69 15 95 %  03/09/24 1200 119/68 -- -- 73 17 94 %  03/09/24 1145 126/65 -- -- 67 17 95 %     Recent laboratory studies:  Recent Labs    03/10/24 0554  WBC 9.8  HGB 11.3*  HCT 33.9*  PLT 187  NA 140  K 4.3  CL 107  CO2 22  BUN 10  CREATININE 0.71  GLUCOSE 136*  CALCIUM  8.7*     Discharge Medications:   Allergies as of 03/10/2024   No Known Allergies      Medication List     TAKE these medications    acetaminophen  500 MG tablet Commonly known as: TYLENOL  Take 1,000 mg by mouth every 6 (six) hours as needed for moderate pain (pain score 4-6).   amLODipine  5 MG tablet Commonly known as: NORVASC  TAKE 1 TABLET(5 MG) BY MOUTH DAILY   ferrous sulfate  325 (65 FE) MG EC tablet Take 1 tablet (325  mg total) by mouth daily with breakfast.   fexofenadine 180 MG tablet Commonly known as: ALLEGRA Take 180 mg by mouth in the morning.   FIBER PLUS CALCIUM  PO Take 2 tablets by mouth in the morning. Chewables   HYDROcodone-acetaminophen  5-325 MG tablet Commonly known as: NORCO/VICODIN Take 1 tablet by mouth every 6 (six) hours as needed for moderate pain (pain score 4-6).   letrozole  2.5 MG tablet Commonly known as: FEMARA  Take 1 tablet (2.5 mg total) by mouth daily.   SLEEP AID PO Take 1 tablet by mouth at bedtime.               Durable Medical Equipment  (From admission, onward)           Start     Ordered   03/09/24 1306  DME 3 n 1  Once        03/09/24 1305   03/09/24 1306  DME Walker rolling  Once       Question Answer Comment  Walker: With 5 Inch Wheels   Patient needs a walker to treat  with the following condition Status post total right knee replacement      03/09/24 1305            Diagnostic Studies: DG Knee Right Port Result Date: 03/09/2024 CLINICAL DATA:  Total knee replacement EXAM: PORTABLE RIGHT KNEE - 2 VIEW COMPARISON:  None Available. FINDINGS: Acute surgical changes of total knee arthroplasty. Press-Fit femoral component. Patellar button. Cemented tibial component. Expected alignment. Soft tissue gas identified. Small effusion. Midline anterior skin staples. Imaging was obtained to aid in treatment. IMPRESSION: Acute surgical changes of total knee arthroplasty. Electronically Signed   By: Adrianna Horde M.D.   On: 03/09/2024 11:25    Disposition: Discharge disposition: 01-Home or Self Care          Follow-up Information     Arnie Lao, MD Follow up in 2 week(s).   Specialty: Orthopedic Surgery Contact information: [address] [place]  [place] [address] [phone]         Edward Graff, [facility] Follow up.   Specialty: Home Health Services Why: Someone from [facility] will contact you to arrange start date and time for your physical therapy. Contact information: [address] [place] [address] [phone]                  Signed: Arnie Lao 03/10/2024, 11:43 AM

## 2024-03-10 NOTE — Progress Notes (Signed)
 DISCHARGE NOTE HOME MARTRICE APT to be discharged Home per MD order. Discussed prescriptions and follow up appointments with the patient. Prescriptions given to patient; medication list explained in detail. Patient verbalized understanding.  Skin clean, dry and intact without evidence of skin break down, no evidence of skin tears noted. IV catheter discontinued intact. Site without signs and symptoms of complications. Dressing and pressure applied. Pt denies pain at the site currently. No complaints noted.  Patient free of lines, drains, and wounds.   An After Visit Summary (AVS) was printed and given to the patient. Patient escorted via wheelchair, and discharged home via private auto.  Tonda Francisco, RN

## 2024-03-10 NOTE — Progress Notes (Signed)
 Physical Therapy Treatment Patient Details Name: Kelly Dixon MRN: [id number] DOB: [birth year] Today's Date: 03/10/2024   History of Present Illness Patient is a 78 y/o female admitted 03/09/24 for R TKA.  PMH positive for Ltka, L breast CA, SBO, HTN.    PT Comments  Pt tolerates treatment well despite reports of increased pain compared to yesterday. Pt is able to transfer and ambulate for short household distances with support of RW. Pt initiates stair training as her bedroom is upstairs, however she declines managing a full flight and reports she will sleep downstairs on the couch initially at home. PT provides education on TKR exercise packet and reinforces frequent mobility at the time of discharge home. PT will follow until discharge is complete.    If plan is discharge home, recommend the following: A little help with walking and/or transfers;A little help with bathing/dressing/bathroom;Help with stairs or ramp for entrance;Assist for transportation;Assistance with cooking/housework   Can travel by private vehicle        Equipment Recommendations  None recommended by PT    Recommendations for Other Services       Precautions / Restrictions Precautions Precautions: Fall Recall of Precautions/Restrictions: Intact Restrictions Weight Bearing Restrictions Per Provider Order: No     Mobility  Bed Mobility Overal bed mobility: Modified Independent                  Transfers Overall transfer level: Needs assistance Equipment used: Rolling walker (2 wheels) Transfers: Sit to/from Stand Sit to Stand: Supervision                Ambulation/Gait Ambulation/Gait assistance: Contact guard assist Gait Distance (Feet): 120 Feet Assistive device: Rolling walker (2 wheels) Gait Pattern/deviations: Step-to pattern, Antalgic Gait velocity: reduced Gait velocity interpretation: <1.8 ft/sec, indicate of risk for recurrent falls   General Gait Details: slowed step-to  gait, reduced stance time on RLE, increased knee flexion in stance phase on RLE   Stairs Stairs: Yes Stairs assistance: Contact guard assist Stair Management: Two rails, Forwards, Step to pattern Number of Stairs: 2 General stair comments: pt declines further stair training   Wheelchair Mobility     Tilt Bed    Modified Rankin (Stroke Patients Only)       Balance Overall balance assessment: Needs assistance Sitting-balance support: No upper extremity supported, Feet supported Sitting balance-Leahy Scale: Good     Standing balance support: Bilateral upper extremity supported Standing balance-Leahy Scale: Poor                              Communication Communication Communication: No apparent difficulties  Cognition Arousal: Alert Behavior During Therapy: WFL for tasks assessed/performed   PT - Cognitive impairments: No apparent impairments                         Following commands: Intact      Cueing Cueing Techniques: Verbal cues  Exercises Total Joint Exercises Goniometric ROM: -11 extension AROM, 68 degrees flexion AROM Other Exercises Other Exercises: PT provides education on TKR exercise packet    General Comments General comments (skin integrity, edema, etc.): VSS on RA, pt in NAD      Pertinent Vitals/Pain Pain Assessment Pain Assessment: 0-10 Pain Score: 10-Worst pain ever Pain Location: R knee Pain Descriptors / Indicators: Aching Pain Intervention(s): RN gave pain meds during session    Home Living  Prior Function            PT Goals (current goals can now be found in the care plan section) Acute Rehab PT Goals Patient Stated Goal: return to independent Progress towards PT goals: Progressing toward goals    Frequency    7X/week      PT Plan      Co-evaluation              AM-PAC PT 6 Clicks Mobility   Outcome Measure  Help needed turning from your back to  your side while in a flat bed without using bedrails?: None Help needed moving from lying on your back to sitting on the side of a flat bed without using bedrails?: None Help needed moving to and from a bed to a chair (including a wheelchair)?: A Little Help needed standing up from a chair using your arms (e.g., wheelchair or bedside chair)?: A Little Help needed to walk in hospital room?: A Little Help needed climbing 3-5 steps with a railing? : A Little 6 Click Score: 20    End of Session Equipment Utilized During Treatment: Gait belt Activity Tolerance: Patient tolerated treatment well Patient left: in chair;with call bell/phone within reach Nurse Communication: Mobility status PT Visit Diagnosis: Difficulty in walking, not elsewhere classified (R26.2);Pain Pain - Right/Left: Right Pain - part of body: Knee     Time: 0805-0840 PT Time Calculation (min) (ACUTE ONLY): 35 min  Charges:    $Gait Training: 8-22 mins $Therapeutic Activity: 8-22 mins PT General Charges $$ ACUTE PT VISIT: 1 Visit                     Rexie Catena, PT, DPT Acute Rehabilitation Office [phone]    Rexie Catena 03/10/2024, 9:51 AM

## 2024-03-10 NOTE — Anesthesia Postprocedure Evaluation (Signed)
 Anesthesia Post Note  Patient: Kelly Dixon  Procedure(s) Performed: ARTHROPLASTY, KNEE, TOTAL (Right: Knee)     Patient location during evaluation: PACU Anesthesia Type: Regional and General Level of consciousness: awake and alert Pain management: pain level controlled Vital Signs Assessment: post-procedure vital signs reviewed and stable Respiratory status: spontaneous breathing, nonlabored ventilation, respiratory function stable and patient connected to nasal cannula oxygen Cardiovascular status: blood pressure returned to baseline and stable Postop Assessment: no apparent nausea or vomiting Anesthetic complications: no   No notable events documented.  Last Vitals:  Vitals:   03/10/24 0430 03/10/24 0804  BP: 125/60 94/61  Pulse: 63 73  Resp: 16 16  Temp: 36.4 C 36.8 C  SpO2: 100% 100%    Last Pain:  Vitals:   03/10/24 0822  TempSrc:   PainSc: 4                  Marshun Duva

## 2024-03-10 NOTE — Telephone Encounter (Signed)
 Evan at [facility] would like to know if an order can be put in for a Rx for pain.  CB# [phone].  Please advise.  Thank you.

## 2024-03-11 DIAGNOSIS — H9193 Unspecified hearing loss, bilateral: Secondary | ICD-10-CM | POA: Diagnosis not present

## 2024-03-11 DIAGNOSIS — H539 Unspecified visual disturbance: Secondary | ICD-10-CM | POA: Diagnosis not present

## 2024-03-11 DIAGNOSIS — K56609 Unspecified intestinal obstruction, unspecified as to partial versus complete obstruction: Secondary | ICD-10-CM | POA: Diagnosis not present

## 2024-03-11 DIAGNOSIS — G8929 Other chronic pain: Secondary | ICD-10-CM | POA: Diagnosis not present

## 2024-03-11 DIAGNOSIS — I10 Essential (primary) hypertension: Secondary | ICD-10-CM | POA: Diagnosis not present

## 2024-03-11 DIAGNOSIS — C50412 Malignant neoplasm of upper-outer quadrant of left female breast: Secondary | ICD-10-CM | POA: Diagnosis not present

## 2024-03-11 DIAGNOSIS — Z170 Estrogen receptor positive status [ER+]: Secondary | ICD-10-CM | POA: Diagnosis not present

## 2024-03-11 DIAGNOSIS — C50411 Malignant neoplasm of upper-outer quadrant of right female breast: Secondary | ICD-10-CM | POA: Diagnosis not present

## 2024-03-11 DIAGNOSIS — Z471 Aftercare following joint replacement surgery: Secondary | ICD-10-CM | POA: Diagnosis not present

## 2024-03-12 ENCOUNTER — Encounter (HOSPITAL_COMMUNITY): Payer: Self-pay | Admitting: Orthopaedic Surgery

## 2024-03-13 DIAGNOSIS — Z471 Aftercare following joint replacement surgery: Secondary | ICD-10-CM | POA: Diagnosis not present

## 2024-03-13 DIAGNOSIS — Z170 Estrogen receptor positive status [ER+]: Secondary | ICD-10-CM | POA: Diagnosis not present

## 2024-03-13 DIAGNOSIS — H9193 Unspecified hearing loss, bilateral: Secondary | ICD-10-CM | POA: Diagnosis not present

## 2024-03-13 DIAGNOSIS — C50411 Malignant neoplasm of upper-outer quadrant of right female breast: Secondary | ICD-10-CM | POA: Diagnosis not present

## 2024-03-13 DIAGNOSIS — G8929 Other chronic pain: Secondary | ICD-10-CM | POA: Diagnosis not present

## 2024-03-13 DIAGNOSIS — K56609 Unspecified intestinal obstruction, unspecified as to partial versus complete obstruction: Secondary | ICD-10-CM | POA: Diagnosis not present

## 2024-03-13 DIAGNOSIS — C50412 Malignant neoplasm of upper-outer quadrant of left female breast: Secondary | ICD-10-CM | POA: Diagnosis not present

## 2024-03-13 DIAGNOSIS — I10 Essential (primary) hypertension: Secondary | ICD-10-CM | POA: Diagnosis not present

## 2024-03-13 DIAGNOSIS — H539 Unspecified visual disturbance: Secondary | ICD-10-CM | POA: Diagnosis not present

## 2024-03-15 DIAGNOSIS — Z170 Estrogen receptor positive status [ER+]: Secondary | ICD-10-CM | POA: Diagnosis not present

## 2024-03-15 DIAGNOSIS — H539 Unspecified visual disturbance: Secondary | ICD-10-CM | POA: Diagnosis not present

## 2024-03-15 DIAGNOSIS — H9193 Unspecified hearing loss, bilateral: Secondary | ICD-10-CM | POA: Diagnosis not present

## 2024-03-15 DIAGNOSIS — C50412 Malignant neoplasm of upper-outer quadrant of left female breast: Secondary | ICD-10-CM | POA: Diagnosis not present

## 2024-03-15 DIAGNOSIS — G8929 Other chronic pain: Secondary | ICD-10-CM | POA: Diagnosis not present

## 2024-03-15 DIAGNOSIS — C50411 Malignant neoplasm of upper-outer quadrant of right female breast: Secondary | ICD-10-CM | POA: Diagnosis not present

## 2024-03-15 DIAGNOSIS — Z471 Aftercare following joint replacement surgery: Secondary | ICD-10-CM | POA: Diagnosis not present

## 2024-03-15 DIAGNOSIS — K56609 Unspecified intestinal obstruction, unspecified as to partial versus complete obstruction: Secondary | ICD-10-CM | POA: Diagnosis not present

## 2024-03-15 DIAGNOSIS — I10 Essential (primary) hypertension: Secondary | ICD-10-CM | POA: Diagnosis not present

## 2024-03-17 ENCOUNTER — Telehealth: Payer: Self-pay | Admitting: *Deleted

## 2024-03-17 ENCOUNTER — Other Ambulatory Visit: Payer: Self-pay | Admitting: Orthopaedic Surgery

## 2024-03-17 DIAGNOSIS — I10 Essential (primary) hypertension: Secondary | ICD-10-CM | POA: Diagnosis not present

## 2024-03-17 DIAGNOSIS — K56609 Unspecified intestinal obstruction, unspecified as to partial versus complete obstruction: Secondary | ICD-10-CM | POA: Diagnosis not present

## 2024-03-17 DIAGNOSIS — Z170 Estrogen receptor positive status [ER+]: Secondary | ICD-10-CM | POA: Diagnosis not present

## 2024-03-17 DIAGNOSIS — H9193 Unspecified hearing loss, bilateral: Secondary | ICD-10-CM | POA: Diagnosis not present

## 2024-03-17 DIAGNOSIS — G8929 Other chronic pain: Secondary | ICD-10-CM | POA: Diagnosis not present

## 2024-03-17 DIAGNOSIS — Z471 Aftercare following joint replacement surgery: Secondary | ICD-10-CM | POA: Diagnosis not present

## 2024-03-17 DIAGNOSIS — C50412 Malignant neoplasm of upper-outer quadrant of left female breast: Secondary | ICD-10-CM | POA: Diagnosis not present

## 2024-03-17 DIAGNOSIS — C50411 Malignant neoplasm of upper-outer quadrant of right female breast: Secondary | ICD-10-CM | POA: Diagnosis not present

## 2024-03-17 DIAGNOSIS — H539 Unspecified visual disturbance: Secondary | ICD-10-CM | POA: Diagnosis not present

## 2024-03-17 MED ORDER — HYDROCODONE-ACETAMINOPHEN 5-325 MG PO TABS: 1.0000 | ORAL_TABLET | Freq: Four times a day (QID) | ORAL | 0 refills | Status: DC | PRN

## 2024-03-17 NOTE — Telephone Encounter (Signed)
 Patient requesting refill of pain medication. Pharmacy [facility] in [facility] Thank you.

## 2024-03-19 DIAGNOSIS — K56609 Unspecified intestinal obstruction, unspecified as to partial versus complete obstruction: Secondary | ICD-10-CM | POA: Diagnosis not present

## 2024-03-19 DIAGNOSIS — G8929 Other chronic pain: Secondary | ICD-10-CM | POA: Diagnosis not present

## 2024-03-19 DIAGNOSIS — C50412 Malignant neoplasm of upper-outer quadrant of left female breast: Secondary | ICD-10-CM | POA: Diagnosis not present

## 2024-03-19 DIAGNOSIS — Z170 Estrogen receptor positive status [ER+]: Secondary | ICD-10-CM | POA: Diagnosis not present

## 2024-03-19 DIAGNOSIS — H9193 Unspecified hearing loss, bilateral: Secondary | ICD-10-CM | POA: Diagnosis not present

## 2024-03-19 DIAGNOSIS — C50411 Malignant neoplasm of upper-outer quadrant of right female breast: Secondary | ICD-10-CM | POA: Diagnosis not present

## 2024-03-19 DIAGNOSIS — I10 Essential (primary) hypertension: Secondary | ICD-10-CM | POA: Diagnosis not present

## 2024-03-19 DIAGNOSIS — H539 Unspecified visual disturbance: Secondary | ICD-10-CM | POA: Diagnosis not present

## 2024-03-19 DIAGNOSIS — Z471 Aftercare following joint replacement surgery: Secondary | ICD-10-CM | POA: Diagnosis not present

## 2024-03-22 ENCOUNTER — Encounter: Payer: Self-pay | Admitting: Physician Assistant

## 2024-03-22 ENCOUNTER — Ambulatory Visit (INDEPENDENT_AMBULATORY_CARE_PROVIDER_SITE_OTHER): Admitting: Physician Assistant

## 2024-03-22 ENCOUNTER — Encounter: Admitting: Orthopaedic Surgery

## 2024-03-22 DIAGNOSIS — Z96652 Presence of left artificial knee joint: Secondary | ICD-10-CM

## 2024-03-22 DIAGNOSIS — Z96651 Presence of right artificial knee joint: Secondary | ICD-10-CM

## 2024-03-22 NOTE — Therapy (Signed)
 OUTPATIENT PHYSICAL THERAPY EVALUATION   Patient Name: Kelly Dixon MRN: [id number] DOB:12/14/1945, 78 y.o., female Today's Date: 03/23/2024  END OF SESSION:  PT End of Session - 03/23/24 0756     Visit Number 1    Number of Visits 24    Date for PT Re-Evaluation 06/15/24    Authorization Type [employer] $25 copay    Progress Note Due on Visit 10    PT Start Time 0800    PT Stop Time 0838    PT Time Calculation (min) 38 min    Activity Tolerance Patient limited by pain    Behavior During Therapy [facility] for tasks assessed/performed          Past Medical History:  Diagnosis Date   Allergy 1996   Anemia    Arthritis    Breast cancer (HCC) 2020   right breast IDC   Breast cancer (HCC) 10/2023   left breast ILC   Cataract May 2018   Gallstones 05/31/2018   Hypertension    Personal history of radiation therapy    PONV (postoperative nausea and vomiting)    Syphilis    Past Surgical History:  Procedure Laterality Date   BREAST BIOPSY Right 08/2019   BREAST BIOPSY Left 10/02/2023   US  LT BREAST BX W LOC DEV 1ST LESION IMG BX SPEC US  GUIDE 10/02/2023 [facility]   BREAST BIOPSY  10/17/2023   MM LT RADIOACTIVE SEED LOC MAMMO GUIDE 10/17/2023 [facility]   BREAST LUMPECTOMY Right 08/2019   BREAST LUMPECTOMY WITH RADIOACTIVE SEED AND SENTINEL LYMPH NODE BIOPSY Right 09/15/2019   Procedure: RIGHT BREAST LUMPECTOMY WITH RADIOACTIVE SEED AND SENTINEL LYMPH NODE BIOPSY;  Surgeon: Belinda Cough, MD;  Location: [facility];  Service: General;  Laterality: Right;   BREAST LUMPECTOMY WITH RADIOACTIVE SEED LOCALIZATION Left 10/20/2023   Procedure: LEFT BREAST LUMPECTOMY WITH RADIOACTIVE SEED LOCALIZATION;  Surgeon: Belinda Cough, MD;  Location: [facility];  Service: General;  Laterality: Left;   CHOLECYSTECTOMY N/A 04/13/2019   Procedure: LAPAROSCOPIC CHOLECYSTECTOMY WITH INTRAOPERATIVE CHOLANGIOGRAM;  Surgeon: Belinda Cough, MD;  Location: [facility];  Service: General;   Laterality: N/A;   DILATION AND CURETTAGE OF UTERUS     JOINT REPLACEMENT  June 2023   RE-EXCISION OF BREAST LUMPECTOMY Left 11/04/2023   Procedure: RE-EXCISION OF INFERIOR MARGIN-LEFT BREAST LUMPECTOMY;  Surgeon: Belinda Cough, MD;  Location: [facility];  Service: General;  Laterality: Left;  LMA   TOTAL KNEE ARTHROPLASTY Left 03/26/2022   Procedure: LEFT TOTAL KNEE ARTHROPLASTY;  Surgeon: Vernetta Lonni GRADE, MD;  Location: [facility];  Service: Orthopedics;  Laterality: Left;   TOTAL KNEE ARTHROPLASTY Right 03/09/2024   Procedure: ARTHROPLASTY, KNEE, TOTAL;  Surgeon: Vernetta Lonni GRADE, MD;  Location: [facility];  Service: Orthopedics;  Laterality: Right;   TUBAL LIGATION  1974   Patient Active Problem List   Diagnosis Date Noted   Status post total right knee replacement 03/09/2024   Genetic testing 02/05/2024   Malignant neoplasm of upper-outer quadrant of left female breast (HCC) 10/07/2023   SBO (small bowel obstruction) (HCC) 09/28/2022   Status post total left knee replacement 03/26/2022   Unilateral primary osteoarthritis, right knee 01/22/2022   Bilateral primary osteoarthritis of knee 01/09/2022   Chronic pain of both knees 10/09/2020   Essential hypertension 09/04/2020   Malignant neoplasm of upper-outer quadrant of right breast in female, estrogen receptor positive (HCC) 09/08/2019    PCP: Frann Mabel Mt DO  REFERRING PROVIDER: Vernetta Lonni GRADE,  MD  REFERRING DIAG: M17.11 (ICD-10-CM) - Unilateral primary osteoarthritis, right knee  THERAPY DIAG:  Chronic pain of right knee  Stiffness of right knee, not elsewhere classified  Muscle weakness (generalized)  Difficulty in walking, not elsewhere classified  Localized edema  Rationale for Evaluation and Treatment: Rehabilitation  ONSET DATE: Rt TKA 03/09/2024  SUBJECTIVE:   SUBJECTIVE STATEMENT: Pt came to clinic s/p recent TKA.  Reported having variable pain levels, taking medicine  for it.  Pt indicated having some variable sleeping but doing better. Pt indicated walker in public but doing walking in house independent.   PERTINENT HISTORY: PMH positive for Lt TKA 2023, Lt breast CA, SBO, HTN.   PAIN:  NPRS scale: upon arrival 7/10, at worst 10/10, at best 4/10 Pain location: Rt knee Pain description: constant, shooting at times, tightness, sore Aggravating factors: WB activity, random at night, inactivity, end range Relieving factors: medicine  PRECAUTIONS: None  WEIGHT BEARING RESTRICTIONS: No  FALLS:  Has patient fallen in last 6 months? No  LIVING ENVIRONMENT: Lives in: House/apartment Stairs: Flight of stairs to bedroom Has following equipment at home: FWW, [facility]  OCCUPATION: Substitute teaching  PLOF: Independent, swim, general exercise routine.   PATIENT GOALS: Reduce pain, back to exercise routine.   OBJECTIVE:   PATIENT SURVEYS:  Patient-Specific Activity Scoring Scheme  0 represents "unable to perform." 10 represents "able to perform at prior level. 0 1 2 3 4 5 6 7 8 9  10 (Date and Score)   Activity Eval  03/23/2024    1. Walking  6    2. Driving  0    3. Standing 3   4. Stairs  8.5   5.    Score 4.375    Total score = sum of the activity scores/number of activities Minimum detectable change (90%CI) for average score = 2 points Minimum detectable change (90%CI) for single activity score = 3 points  COGNITION: Overall cognitive status: WFL    SENSATION: 03/23/2024 Not tested  EDEMA:  03/23/2024: Localized edema Rt knee, lower leg noted.   MUSCLE LENGTH: 03/23/2024 No specific testing  POSTURE:  03/23/2024 Weight shift off Rt leg in standing.   PALPATION: 03/23/2024 Peri incision tenderness.   LOWER EXTREMITY ROM:   ROM Right Eval 03/23/2024 Left Eval 03/23/2024  Hip flexion    Hip extension    Hip abduction    Hip adduction    Hip internal rotation    Hip external rotation    Knee flexion 90 AROM in  supine heel slide   Knee extension -16 AROM in seated LAQ  -12 in supine heel prop    Ankle dorsiflexion    Ankle plantarflexion    Ankle inversion    Ankle eversion     (Blank rows = not tested)  LOWER EXTREMITY MMT:  MMT Right Eval 03/23/2024 Left Eval 03/23/2024  Hip flexion 4/5 5/5  Hip extension    Hip abduction    Hip adduction    Hip internal rotation    Hip external rotation    Knee flexion 5/5 5/5  Knee extension 3+/5 5/5  Ankle dorsiflexion 5/5 5/5  Ankle plantarflexion    Ankle inversion    Ankle eversion     (Blank rows = not tested)  LOWER EXTREMITY SPECIAL TESTS:  03/23/2024 No specific testing today  FUNCTIONAL TESTS:  03/23/2024 18 inch chair transfer: able to perform on 1st try but Lt leg dominant.  Lt SLS: < 5 seconds Rt SLS: unable unassisted  TUG : FWW 14.97 seconds  GAIT: 03/23/2024 Arrival with FWW, able to perform safe ambulation c SPC in Lt UE.  Lacking TKE in stance, knee flexion in swing.                                                                                                                                                                         TODAY'S TREATMENT                                                                          DATE: 03/23/2024 Therex:    HEP instruction/performance c cues for techniques, handout provided.  Trial set performed of each for comprehension and symptom assessment.  See below for exercise list  Self Care: Edema control education for elevation , icing, ankle pumps.  Education on importance of mobility gains, specific extension for ambulation.   Gait Training Education on cane sequencing with placement in Lt UE.  Trial of household distances.    Vaso 10 mins Rt knee in elevation 34 degrees medium compression .  Self care and therex education performed during vaso.    PATIENT EDUCATION:  03/23/2024 Education details: HEP, POC Person educated: Patient Education method: Explanation,  Demonstration, Verbal cues, and Handouts Education comprehension: verbalized understanding, returned demonstration, and verbal cues required  HOME EXERCISE PROGRAM: Access Code: [facility] URL: [email or web address] Date: 03/23/2024 Prepared by: Ozell Silvan  Exercises - Supine Heel Slide with Strap (Mirrored)  - 2-3 x daily - 7 x weekly - 1 sets - 10 reps - 5 hold - Supine Quadricep Sets  - 2-3 x daily - 7 x weekly - 1 sets - 10 reps - 5 hold - Supine Knee Extension Mobilization with Weight  - 2-3 x daily - 7 x weekly - 1 sets - 1 reps - to tolerance up to 5 mins hold - Seated Long Arc Quad  - 3-5 x daily - 7 x weekly - 1 sets - 5-10 reps - 2 hold - Seated Knee Flexion AAROM  - 2-3 x daily - 7 x weekly - 1 sets - 5 reps - 15 hold - Seated Quad Set  - 2-3 x daily - 7 x weekly - 1 sets - 10 reps - 5 hold - Seated Knee Extension Stretch with Chair  - 2-3 x daily - 7 x weekly - 1 sets - 1 reps - to tolerance.  hold  ASSESSMENT:  CLINICAL IMPRESSION: Patient is a 78  y.o. who comes to clinic with complaints of Rt knee pain s/p Rt TKA on 03/09/2024 with mobility, strength and movement coordination deficits that impair their ability to perform usual daily and recreational functional activities without increase difficulty/symptoms at this time.  Patient to benefit from skilled PT services to address impairments and limitations to improve to previous level of function without restriction secondary to condition.   OBJECTIVE IMPAIRMENTS: Abnormal gait, decreased activity tolerance, decreased balance, decreased coordination, decreased endurance, decreased mobility, difficulty walking, decreased ROM, decreased strength, hypomobility, increased edema, increased fascial restrictions, impaired perceived functional ability, increased muscle spasms, impaired flexibility, improper body mechanics, and pain.   ACTIVITY LIMITATIONS: carrying, lifting, bending, sitting, standing, squatting, sleeping,  stairs, transfers, bed mobility, and locomotion level  PARTICIPATION LIMITATIONS: meal prep, cleaning, laundry, interpersonal relationship, driving, shopping, community activity, and occupation  PERSONAL FACTORS: PMH positive for Lt TKA 2023, Lt breast CA, SBO, HTN.  are also affecting patient's functional outcome.   REHAB POTENTIAL: Good  CLINICAL DECISION MAKING: Stable/uncomplicated  EVALUATION COMPLEXITY: Low   GOALS: Goals reviewed with patient? Yes  SHORT TERM GOALS: (target date for Short term goals are 3 weeks 04/13/2024)   1.  Patient will demonstrate independent use of home exercise program to maintain progress from in clinic treatments.  Goal status: New  LONG TERM GOALS: (target dates for all long term goals are 12 weeks  06/15/2024 )   1. Patient will demonstrate/report pain at worst less than or equal to 2/10 to facilitate minimal limitation in daily activity secondary to pain symptoms.  Goal status: New   2. Patient will demonstrate independent use of home exercise program to facilitate ability to maintain/progress functional gains from skilled physical therapy services.  Goal status: New   3. Patient will demonstrate Patient specific functional scale avg > or = 8/10 to indicate reduced disability due to condition.   Goal status: New   4.  Patient will demonstrate Rt LE MMT 5/5 throughout to faciltiate usual transfers, stairs, squatting at [facility] for daily life.   Goal status: New   5.  Patient will demonstrate Rt knee AROM 0-110 deg to facilitate transfers, ambulation, stairs navigation at PLOF.  Goal status: New   6.  Patient will demonstrate ascending/descending stairs reciprocally s UE assist for community integration.   Goal status: New   7.  Patient will demonstrate independent ambulation community distances > 500 ft for community integration.  Goal Status: New   PLAN:  PT FREQUENCY: 1-2x/week  PT DURATION: 12 weeks  PLANNED INTERVENTIONS: Can  include 97110-Therapeutic exercises, 97530- Therapeutic activity, 97112- Neuromuscular re-education, [phone]- Self Care, 02859- Manual therapy, [phone]- Gait training, [phone]- Electrical stimulation (unattended), 97750 Physical performance testing, 97016- Vasopneumatic device  Patient/Family education, Balance training, Stair training, Taping, Dry Needling, Joint mobilization, Joint manipulation, Spinal manipulation, Spinal mobilization, Scar mobilization, Vestibular training, Visual/preceptual remediation/compensation, DME instructions, Cryotherapy, and Moist heat.  All performed as medically necessary.  All included unless contraindicated  PLAN FOR NEXT SESSION: Review HEP knowledge/results. Extension and flexion mobility gains, early strengthening.   Ozell Silvan, PT, DPT, OCS, ATC 03/23/24  8:43 AM  Referring diagnosis? M17.11 (ICD-10-CM) - Unilateral primary osteoarthritis, right knee Treatment diagnosis? (if different than referring diagnosis) M25.561, M25.661, M62.81, R26.2, R60.0 What was this (referring dx) caused by? [x]  Surgery []  Fall []  Ongoing issue []  Arthritis []  Other: ____________  Laterality: [x]  Rt []  Lt []  Both  Check all possible CPT codes:  *CHOOSE 10 OR LESS*    See Planned  Interventions listed in the Plan section of the Evaluation.

## 2024-03-22 NOTE — Progress Notes (Signed)
 HPI: [name] returns today for right total knee arthroplasty 1525.  She is overall doing well.  Length dependent 6 out of 10 pain.  Sometimes a little bit worse especially when going up or down stairs.  She is using her walker to ambulate.  She has therapy scheduled at our office to begin tomorrow.  Taking hydrocodone  for pain.  Aspirin  for DVT prophylaxis no aspirin  prior to surgery.  Negative for fevers chills.  Physical exam: General Well-developed well-nourished female in no acute distress.  Right knee full extension.  Flexion to 105 degrees.  Surgical incisions are healing well no signs of infection or wound dehiscence.  Right knee ankle dorsiflexion intact.  Calf supple nontender.  Impression: Status post right total knee arthroplasty  Plan: She will continue work on range of motion and strengthening.  Staples harvested Steri-Strips applied.  She understands need timing for approximately 6 weeks.  She will remain on her aspirin  once daily for another week and then discontinue as she is on no aspirin  prior to surgery.  Questions were encouraged and answered at length.  Follow-up 1 month.

## 2024-03-23 ENCOUNTER — Ambulatory Visit: Admitting: Rehabilitative and Restorative Service Providers"

## 2024-03-23 ENCOUNTER — Ambulatory Visit

## 2024-03-23 ENCOUNTER — Telehealth: Payer: Self-pay

## 2024-03-23 ENCOUNTER — Encounter: Payer: Self-pay | Admitting: Rehabilitative and Restorative Service Providers"

## 2024-03-23 DIAGNOSIS — M25661 Stiffness of right knee, not elsewhere classified: Secondary | ICD-10-CM

## 2024-03-23 DIAGNOSIS — G8929 Other chronic pain: Secondary | ICD-10-CM | POA: Diagnosis not present

## 2024-03-23 DIAGNOSIS — R262 Difficulty in walking, not elsewhere classified: Secondary | ICD-10-CM

## 2024-03-23 DIAGNOSIS — R600 Localized edema: Secondary | ICD-10-CM | POA: Diagnosis not present

## 2024-03-23 DIAGNOSIS — M25561 Pain in right knee: Secondary | ICD-10-CM

## 2024-03-23 DIAGNOSIS — M6281 Muscle weakness (generalized): Secondary | ICD-10-CM

## 2024-03-23 NOTE — Telephone Encounter (Signed)
Unsuccessful attempt to reach patient on preferred number listed in notes for scheduled AWV. Left message on voicemail okay to reschedule. 

## 2024-03-26 ENCOUNTER — Encounter: Payer: Self-pay | Admitting: Physical Therapy

## 2024-03-26 ENCOUNTER — Ambulatory Visit (INDEPENDENT_AMBULATORY_CARE_PROVIDER_SITE_OTHER): Admitting: Physical Therapy

## 2024-03-26 DIAGNOSIS — R262 Difficulty in walking, not elsewhere classified: Secondary | ICD-10-CM | POA: Diagnosis not present

## 2024-03-26 DIAGNOSIS — M25662 Stiffness of left knee, not elsewhere classified: Secondary | ICD-10-CM | POA: Diagnosis not present

## 2024-03-26 DIAGNOSIS — M25562 Pain in left knee: Secondary | ICD-10-CM

## 2024-03-26 DIAGNOSIS — R600 Localized edema: Secondary | ICD-10-CM

## 2024-03-26 DIAGNOSIS — M25661 Stiffness of right knee, not elsewhere classified: Secondary | ICD-10-CM | POA: Diagnosis not present

## 2024-03-26 DIAGNOSIS — M25561 Pain in right knee: Secondary | ICD-10-CM | POA: Diagnosis not present

## 2024-03-26 DIAGNOSIS — M6281 Muscle weakness (generalized): Secondary | ICD-10-CM | POA: Diagnosis not present

## 2024-03-26 DIAGNOSIS — G8929 Other chronic pain: Secondary | ICD-10-CM

## 2024-03-26 NOTE — Therapy (Addendum)
 OUTPATIENT PHYSICAL THERAPY TREATMENT   Patient Name: Kelly Dixon MRN: [id number] DOB:07/09/1946, 78 y.o., female Today's Date: 03/26/2024  END OF SESSION:  PT End of Session - 03/26/24 0931     Visit Number 2    Number of Visits 24    Date for PT Re-Evaluation 06/15/24    Authorization Type [employer] $25 copay    Progress Note Due on Visit 10    PT Start Time 0931    PT Stop Time 1010    PT Time Calculation (min) 39 min    Activity Tolerance Patient limited by pain    Behavior During Therapy [facility] for tasks assessed/performed           Past Medical History:  Diagnosis Date   Allergy 1996   Anemia    Arthritis    Breast cancer (HCC) 2020   right breast IDC   Breast cancer (HCC) 10/2023   left breast ILC   Cataract May 2018   Gallstones 05/31/2018   Hypertension    Personal history of radiation therapy    PONV (postoperative nausea and vomiting)    Syphilis    Past Surgical History:  Procedure Laterality Date   BREAST BIOPSY Right 08/2019   BREAST BIOPSY Left 10/02/2023   US  LT BREAST BX W LOC DEV 1ST LESION IMG BX SPEC US  GUIDE 10/02/2023 [facility]   BREAST BIOPSY  10/17/2023   MM LT RADIOACTIVE SEED LOC MAMMO GUIDE 10/17/2023 [facility]   BREAST LUMPECTOMY Right 08/2019   BREAST LUMPECTOMY WITH RADIOACTIVE SEED AND SENTINEL LYMPH NODE BIOPSY Right 09/15/2019   Procedure: RIGHT BREAST LUMPECTOMY WITH RADIOACTIVE SEED AND SENTINEL LYMPH NODE BIOPSY;  Surgeon: Belinda Cough, MD;  Location: [facility];  Service: General;  Laterality: Right;   BREAST LUMPECTOMY WITH RADIOACTIVE SEED LOCALIZATION Left 10/20/2023   Procedure: LEFT BREAST LUMPECTOMY WITH RADIOACTIVE SEED LOCALIZATION;  Surgeon: Belinda Cough, MD;  Location: [facility];  Service: General;  Laterality: Left;   CHOLECYSTECTOMY N/A 04/13/2019   Procedure: LAPAROSCOPIC CHOLECYSTECTOMY WITH INTRAOPERATIVE CHOLANGIOGRAM;  Surgeon: Belinda Cough, MD;  Location: [facility];  Service: General;   Laterality: N/A;   DILATION AND CURETTAGE OF UTERUS     JOINT REPLACEMENT  June 2023   RE-EXCISION OF BREAST LUMPECTOMY Left 11/04/2023   Procedure: RE-EXCISION OF INFERIOR MARGIN-LEFT BREAST LUMPECTOMY;  Surgeon: Belinda Cough, MD;  Location: [facility];  Service: General;  Laterality: Left;  LMA   TOTAL KNEE ARTHROPLASTY Left 03/26/2022   Procedure: LEFT TOTAL KNEE ARTHROPLASTY;  Surgeon: Vernetta Lonni GRADE, MD;  Location: [facility] OR;  Service: Orthopedics;  Laterality: Left;   TOTAL KNEE ARTHROPLASTY Right 03/09/2024   Procedure: ARTHROPLASTY, KNEE, TOTAL;  Surgeon: Vernetta Lonni GRADE, MD;  Location: [facility];  Service: Orthopedics;  Laterality: Right;   TUBAL LIGATION  1974   Patient Active Problem List   Diagnosis Date Noted   Status post total right knee replacement 03/09/2024   Genetic testing 02/05/2024   Malignant neoplasm of upper-outer quadrant of left female breast (HCC) 10/07/2023   SBO (small bowel obstruction) (HCC) 09/28/2022   Status post total left knee replacement 03/26/2022   Unilateral primary osteoarthritis, right knee 01/22/2022   Bilateral primary osteoarthritis of knee 01/09/2022   Chronic pain of both knees 10/09/2020   Essential hypertension 09/04/2020   Malignant neoplasm of upper-outer quadrant of right breast in female, estrogen receptor positive (HCC) 09/08/2019    PCP: Frann Mabel Mt DO  REFERRING PROVIDER: Vernetta Lonni  Y, MD  REFERRING DIAG: M17.11 (ICD-10-CM) - Unilateral primary osteoarthritis, right knee  THERAPY DIAG:  Chronic pain of right knee  Stiffness of right knee, not elsewhere classified  Muscle weakness (generalized)  Difficulty in walking, not elsewhere classified  Localized edema  Acute pain of left knee  Stiffness of left knee, not elsewhere classified  Rationale for Evaluation and Treatment: Rehabilitation  ONSET DATE: Rt TKA 03/09/2024  SUBJECTIVE:   SUBJECTIVE STATEMENT: Pt states  she did some heel slides this morning. Has not been taking her prescription pain medication. Has not been using a/d in the house but using the cane in the community.  PERTINENT HISTORY: PMH positive for Lt TKA 2023, Lt breast CA, SBO, HTN.   PAIN:  NPRS scale: upon arrival 7/10, at worst 10/10, at best 4/10 Pain location: Rt knee Pain description: constant, shooting at times, tightness, sore Aggravating factors: WB activity, random at night, inactivity, end range Relieving factors: medicine  PRECAUTIONS: None  WEIGHT BEARING RESTRICTIONS: No  FALLS:  Has patient fallen in last 6 months? No  LIVING ENVIRONMENT: Lives in: House/apartment Stairs: Flight of stairs to bedroom Has following equipment at home: FWW, [facility]  OCCUPATION: Substitute teaching  PLOF: Independent, swim, general exercise routine.   PATIENT GOALS: Reduce pain, back to exercise routine.   OBJECTIVE:   PATIENT SURVEYS:  Patient-Specific Activity Scoring Scheme  0 represents "unable to perform." 10 represents "able to perform at prior level. 0 1 2 3 4 5 6 7 8 9  10 (Date and Score)   Activity Eval  03/23/2024    1. Walking  6    2. Driving  0    3. Standing 3   4. Stairs  8.5   5.    Score 4.375    Total score = sum of the activity scores/number of activities Minimum detectable change (90%CI) for average score = 2 points Minimum detectable change (90%CI) for single activity score = 3 points  COGNITION: Overall cognitive status: WFL    SENSATION: 03/23/2024 Not tested  EDEMA:  03/23/2024: Localized edema Rt knee, lower leg noted.   MUSCLE LENGTH: 03/23/2024 No specific testing  POSTURE:  03/23/2024 Weight shift off Rt leg in standing.   PALPATION: 03/23/2024 Peri incision tenderness.   LOWER EXTREMITY ROM:   ROM Right Eval 03/23/2024 Left Eval 03/23/2024  Hip flexion    Hip extension    Hip abduction    Hip adduction    Hip internal rotation    Hip external rotation    Knee  flexion 90 AROM in supine heel slide   Knee extension -16 AROM in seated LAQ  -12 in supine heel prop    Ankle dorsiflexion    Ankle plantarflexion    Ankle inversion    Ankle eversion     (Blank rows = not tested)  LOWER EXTREMITY MMT:  MMT Right Eval 03/23/2024 Left Eval 03/23/2024  Hip flexion 4/5 5/5  Hip extension    Hip abduction    Hip adduction    Hip internal rotation    Hip external rotation    Knee flexion 5/5 5/5  Knee extension 3+/5 5/5  Ankle dorsiflexion 5/5 5/5  Ankle plantarflexion    Ankle inversion    Ankle eversion     (Blank rows = not tested)  LOWER EXTREMITY SPECIAL TESTS:  03/23/2024 No specific testing today  FUNCTIONAL TESTS:  03/23/2024 18 inch chair transfer: able to perform on 1st try but Lt leg dominant.  Lt  SLS: < 5 seconds Rt SLS: unable unassisted TUG : FWW 14.97 seconds  GAIT: 03/23/2024 Arrival with FWW, able to perform safe ambulation c SPC in Lt UE.  Lacking TKE in stance, knee flexion in swing.                                                                                                                                                                         TODAY'S TREATMENT                                                                          DATE: 03/26/2024 Therex: Supine Heel slide 2x10x3 Supine hamstring stretch with strap 2x30 Supine bridge to fatigue (10 reps) Supine clamshell green TB 2x10 Standing heel/toe raise 2x10  Neuromuscular Re-ed: Supine quad set 2x10x3 Supine SAQ 2x10x5 Supine SLR 2x10 Tandem stance 2x30 Standing slow march 2x10  Self Care: K tape in squid shape pattern to help with R ankle edema/pain  Vaso 10 mins Rt knee in elevation 34 degrees medium compression .  Self care and therex education performed during vaso.    TODAY'S TREATMENT                                                                          DATE: 03/23/2024 Therex:    HEP instruction/performance c cues for  techniques, handout provided.  Trial set performed of each for comprehension and symptom assessment.  See below for exercise list  Self Care: Edema control education for elevation , icing, ankle pumps.  Education on importance of mobility gains, specific extension for ambulation.   Gait Training Education on cane sequencing with placement in Lt UE.  Trial of household distances.    Vaso 10 mins Rt knee in elevation 34 degrees medium compression .  Self care and therex education performed during vaso.    PATIENT EDUCATION:  03/23/2024 Education details: HEP, POC Person educated: Patient Education method: Explanation, Demonstration, Verbal cues, and Handouts Education comprehension: verbalized understanding, returned demonstration, and verbal cues required  HOME EXERCISE PROGRAM: Access Code: [facility] URL: [email or web address] Date: 03/23/2024 Prepared by: Ozell Silvan  Exercises - Supine Heel Slide with Strap (Mirrored)  - 2-3  x daily - 7 x weekly - 1 sets - 10 reps - 5 hold - Supine Quadricep Sets  - 2-3 x daily - 7 x weekly - 1 sets - 10 reps - 5 hold - Supine Knee Extension Mobilization with Weight  - 2-3 x daily - 7 x weekly - 1 sets - 1 reps - to tolerance up to 5 mins hold - Seated Long Arc Quad  - 3-5 x daily - 7 x weekly - 1 sets - 5-10 reps - 2 hold - Seated Knee Flexion AAROM  - 2-3 x daily - 7 x weekly - 1 sets - 5 reps - 15 hold - Seated Quad Set  - 2-3 x daily - 7 x weekly - 1 sets - 10 reps - 5 hold - Seated Knee Extension Stretch with Chair  - 2-3 x daily - 7 x weekly - 1 sets - 1 reps - to tolerance.  hold  ASSESSMENT:  CLINICAL IMPRESSION: Continued knee ROM exercises and quad strengthening. Added gross LE strengthening this session incorporating hips and ankles. Initiated balance exercises. Pt has difficulty tolerating compression for her R ankle/foot edema -- provided trial of k-tape today.   From eval: Patient is a 78 y.o. who comes to clinic  with complaints of Rt knee pain s/p Rt TKA on 03/09/2024 with mobility, strength and movement coordination deficits that impair their ability to perform usual daily and recreational functional activities without increase difficulty/symptoms at this time.  Patient to benefit from skilled PT services to address impairments and limitations to improve to previous level of function without restriction secondary to condition.   OBJECTIVE IMPAIRMENTS: Abnormal gait, decreased activity tolerance, decreased balance, decreased coordination, decreased endurance, decreased mobility, difficulty walking, decreased ROM, decreased strength, hypomobility, increased edema, increased fascial restrictions, impaired perceived functional ability, increased muscle spasms, impaired flexibility, improper body mechanics, and pain.   ACTIVITY LIMITATIONS: carrying, lifting, bending, sitting, standing, squatting, sleeping, stairs, transfers, bed mobility, and locomotion level  PARTICIPATION LIMITATIONS: meal prep, cleaning, laundry, interpersonal relationship, driving, shopping, community activity, and occupation  PERSONAL FACTORS: PMH positive for Lt TKA 2023, Lt breast CA, SBO, HTN.  are also affecting patient's functional outcome.   REHAB POTENTIAL: Good  CLINICAL DECISION MAKING: Stable/uncomplicated  EVALUATION COMPLEXITY: Low   GOALS: Goals reviewed with patient? Yes  SHORT TERM GOALS: (target date for Short term goals are 3 weeks 04/13/2024)   1.  Patient will demonstrate independent use of home exercise program to maintain progress from in clinic treatments.  Goal status: New  LONG TERM GOALS: (target dates for all long term goals are 12 weeks  06/15/2024 )   1. Patient will demonstrate/report pain at worst less than or equal to 2/10 to facilitate minimal limitation in daily activity secondary to pain symptoms.  Goal status: New   2. Patient will demonstrate independent use of home exercise program to  facilitate ability to maintain/progress functional gains from skilled physical therapy services.  Goal status: New   3. Patient will demonstrate Patient specific functional scale avg > or = 8/10 to indicate reduced disability due to condition.   Goal status: New   4.  Patient will demonstrate Rt LE MMT 5/5 throughout to faciltiate usual transfers, stairs, squatting at [facility] for daily life.   Goal status: New   5.  Patient will demonstrate Rt knee AROM 0-110 deg to facilitate transfers, ambulation, stairs navigation at PLOF.  Goal status: New   6.  Patient will demonstrate ascending/descending stairs reciprocally  s UE assist for community integration.   Goal status: New   7.  Patient will demonstrate independent ambulation community distances > 500 ft for community integration.  Goal Status: New   PLAN:  PT FREQUENCY: 1-2x/week  PT DURATION: 12 weeks  PLANNED INTERVENTIONS: Can include 97110-Therapeutic exercises, 97530- Therapeutic activity, 97112- Neuromuscular re-education, [phone]- Self Care, 02859- Manual therapy, [phone]- Gait training, [phone]- Electrical stimulation (unattended), 97750 Physical performance testing, [id number]- Vasopneumatic device  Patient/Family education, Balance training, Stair training, Taping, Dry Needling, Joint mobilization, Joint manipulation, Spinal manipulation, Spinal mobilization, Scar mobilization, Vestibular training, Visual/preceptual remediation/compensation, DME instructions, Cryotherapy, and Moist heat.  All performed as medically necessary.  All included unless contraindicated  PLAN FOR NEXT SESSION: Review HEP knowledge/results. Extension and flexion mobility gains, early strengthening.   Maisen Schmit April Ma L Jadea Shiffer, PT, DPT 03/26/24  9:31 AM  Referring diagnosis? M17.11 (ICD-10-CM) - Unilateral primary osteoarthritis, right knee Treatment diagnosis? (if different than referring diagnosis) M25.561, M25.661, M62.81, R26.2, R60.0 What was this  (referring dx) caused by? [x]  Surgery []  Fall []  Ongoing issue []  Arthritis []  Other: ____________  Laterality: [x]  Rt []  Lt []  Both  Check all possible CPT codes:  *CHOOSE 10 OR LESS*    See Planned Interventions listed in the Plan section of the Evaluation.

## 2024-03-30 ENCOUNTER — Ambulatory Visit: Payer: Self-pay | Admitting: Rehabilitative and Restorative Service Providers"

## 2024-03-30 DIAGNOSIS — M25661 Stiffness of right knee, not elsewhere classified: Secondary | ICD-10-CM

## 2024-03-30 DIAGNOSIS — R262 Difficulty in walking, not elsewhere classified: Secondary | ICD-10-CM

## 2024-03-30 DIAGNOSIS — M6281 Muscle weakness (generalized): Secondary | ICD-10-CM | POA: Diagnosis not present

## 2024-03-30 DIAGNOSIS — M25561 Pain in right knee: Secondary | ICD-10-CM | POA: Diagnosis not present

## 2024-03-30 DIAGNOSIS — R600 Localized edema: Secondary | ICD-10-CM

## 2024-03-30 DIAGNOSIS — G8929 Other chronic pain: Secondary | ICD-10-CM | POA: Diagnosis not present

## 2024-03-30 NOTE — Therapy (Signed)
 OUTPATIENT PHYSICAL THERAPY TREATMENT   Patient Name: Kelly Dixon MRN: [id number] DOB:[birth year], 78 y.o., female Today's Date: 03/30/2024  END OF SESSION:  PT End of Session - 03/30/24 1048     Visit Number 3    Number of Visits 24    Date for PT Re-Evaluation 06/15/24    Authorization Type [employer] $25 copay    Progress Note Due on Visit 10    PT Start Time 1048    PT Stop Time 1138    PT Time Calculation (min) 50 min    Activity Tolerance Patient tolerated treatment well;No increased pain    Behavior During Therapy WFL for tasks assessed/performed            Past Medical History:  Diagnosis Date   Allergy 1996   Anemia    Arthritis    Breast cancer (HCC) 2020   right breast IDC   Breast cancer (HCC) 10/2023   left breast ILC   Cataract May 2018   Gallstones 05/31/2018   Hypertension    Personal history of radiation therapy    PONV (postoperative nausea and vomiting)    Syphilis    Past Surgical History:  Procedure Laterality Date   BREAST BIOPSY Right 08/2019   BREAST BIOPSY Left 10/02/2023   US  LT BREAST BX W LOC DEV 1ST LESION IMG BX SPEC US  GUIDE 10/02/2023 [facility]   BREAST BIOPSY  10/17/2023   MM LT RADIOACTIVE SEED LOC MAMMO GUIDE 10/17/2023 [facility]   BREAST LUMPECTOMY Right 08/2019   BREAST LUMPECTOMY WITH RADIOACTIVE SEED AND SENTINEL LYMPH NODE BIOPSY Right 09/15/2019   Procedure: RIGHT BREAST LUMPECTOMY WITH RADIOACTIVE SEED AND SENTINEL LYMPH NODE BIOPSY;  Surgeon: Belinda Cough, MD;  Location: [facility];  Service: General;  Laterality: Right;   BREAST LUMPECTOMY WITH RADIOACTIVE SEED LOCALIZATION Left 10/20/2023   Procedure: LEFT BREAST LUMPECTOMY WITH RADIOACTIVE SEED LOCALIZATION;  Surgeon: Belinda Cough, MD;  Location: [facility];  Service: General;  Laterality: Left;   CHOLECYSTECTOMY N/A 04/13/2019   Procedure: LAPAROSCOPIC CHOLECYSTECTOMY WITH INTRAOPERATIVE CHOLANGIOGRAM;  Surgeon: Belinda Cough, MD;  Location:  [facility];  Service: General;  Laterality: N/A;   DILATION AND CURETTAGE OF UTERUS     JOINT REPLACEMENT  June 2023   RE-EXCISION OF BREAST LUMPECTOMY Left 11/04/2023   Procedure: RE-EXCISION OF INFERIOR MARGIN-LEFT BREAST LUMPECTOMY;  Surgeon: Belinda Cough, MD;  Location: [facility];  Service: General;  Laterality: Left;  LMA   TOTAL KNEE ARTHROPLASTY Left 03/26/2022   Procedure: LEFT TOTAL KNEE ARTHROPLASTY;  Surgeon: Vernetta Lonni GRADE, MD;  Location: [facility] OR;  Service: Orthopedics;  Laterality: Left;   TOTAL KNEE ARTHROPLASTY Right 03/09/2024   Procedure: ARTHROPLASTY, KNEE, TOTAL;  Surgeon: Vernetta Lonni GRADE, MD;  Location: [facility];  Service: Orthopedics;  Laterality: Right;   TUBAL LIGATION  1974   Patient Active Problem List   Diagnosis Date Noted   Status post total right knee replacement 03/09/2024   Genetic testing 02/05/2024   Malignant neoplasm of upper-outer quadrant of left female breast (HCC) 10/07/2023   SBO (small bowel obstruction) (HCC) 09/28/2022   Status post total left knee replacement 03/26/2022   Unilateral primary osteoarthritis, right knee 01/22/2022   Bilateral primary osteoarthritis of knee 01/09/2022   Chronic pain of both knees 10/09/2020   Essential hypertension 09/04/2020   Malignant neoplasm of upper-outer quadrant of right breast in female, estrogen receptor positive (HCC) 09/08/2019    PCP: Frann Mabel Mt DO  REFERRING  PROVIDER: Vernetta Lonni GRADE, MD  REFERRING DIAG: M17.11 (ICD-10-CM) - Unilateral primary osteoarthritis, right knee  THERAPY DIAG:  Chronic pain of right knee  Stiffness of right knee, not elsewhere classified  Muscle weakness (generalized)  Difficulty in walking, not elsewhere classified  Localized edema  Rationale for Evaluation and Treatment: Rehabilitation  ONSET DATE: Rt TKA 03/09/2024  SUBJECTIVE:   SUBJECTIVE STATEMENT: Pt indicated waking this morning and feeling more noted  symptoms around 5-6/10 and not sure why.  Reported days before less.    PERTINENT HISTORY: PMH positive for Lt TKA 2023, Lt breast CA, SBO, HTN.   PAIN:  NPRS scale: up to 5-6/10 Pain location: Rt knee Pain description: constant, shooting at times, tightness, sore Aggravating factors: WB activity, random at night, inactivity, end range Relieving factors: medicine  PRECAUTIONS: None  WEIGHT BEARING RESTRICTIONS: No  FALLS:  Has patient fallen in last 6 months? No  LIVING ENVIRONMENT: Lives in: House/apartment Stairs: Flight of stairs to bedroom Has following equipment at home: FWW, [facility]  OCCUPATION: Substitute teaching  PLOF: Independent, swim, general exercise routine.   PATIENT GOALS: Reduce pain, back to exercise routine.   OBJECTIVE:   PATIENT SURVEYS:  Patient-Specific Activity Scoring Scheme  0 represents "unable to perform." 10 represents "able to perform at prior level. 0 1 2 3 4 5 6 7 8 9  10 (Date and Score)   Activity Eval  03/23/2024    1. Walking  6    2. Driving  0    3. Standing 3   4. Stairs  8.5   5.    Score 4.375    Total score = sum of the activity scores/number of activities Minimum detectable change (90%CI) for average score = 2 points Minimum detectable change (90%CI) for single activity score = 3 points  COGNITION: Overall cognitive status: WFL    SENSATION: 03/23/2024 Not tested  EDEMA:  03/23/2024: Localized edema Rt knee, lower leg noted.   MUSCLE LENGTH: 03/23/2024 No specific testing  POSTURE:  03/23/2024 Weight shift off Rt leg in standing.   PALPATION: 03/23/2024 Peri incision tenderness.   LOWER EXTREMITY ROM:   ROM Right Eval 03/23/2024 Left Eval 03/23/2024 Right 03/30/2024  Hip flexion     Hip extension     Hip abduction     Hip adduction     Hip internal rotation     Hip external rotation     Knee flexion 90 AROM in supine heel slide  96 AROM supine heel slide  Knee extension -16 AROM in seated  LAQ  -12 in supine heel prop   -7 In supine heel prop  Ankle dorsiflexion     Ankle plantarflexion     Ankle inversion     Ankle eversion      (Blank rows = not tested)  LOWER EXTREMITY MMT:  MMT Right Eval 03/23/2024 Left Eval 03/23/2024  Hip flexion 4/5 5/5  Hip extension    Hip abduction    Hip adduction    Hip internal rotation    Hip external rotation    Knee flexion 5/5 5/5  Knee extension 3+/5 5/5  Ankle dorsiflexion 5/5 5/5  Ankle plantarflexion    Ankle inversion    Ankle eversion     (Blank rows = not tested)  LOWER EXTREMITY SPECIAL TESTS:  03/23/2024 No specific testing today  FUNCTIONAL TESTS:  03/23/2024 18 inch chair transfer: able to perform on 1st try but Lt leg dominant.  Lt SLS: < 5 seconds  Rt SLS: unable unassisted TUG : FWW 14.97 seconds  GAIT: 03/23/2024 Arrival with FWW, able to perform safe ambulation c SPC in Lt UE.  Lacking TKE in stance, knee flexion in swing.                                                                                                                                                                         TODAY'S TREATMENT                                                                          DATE: 03/30/2024 Therex: UBE LE only seat 10 full revolutions lvl 2.5 8 mins for ROM.  Seated Rt leg LAQ x 15 for ROM Supine heel slide 5 sec hold combo with quad set Rt leg 5 sec x10 each Time spent in education of continued emphasis on extension stretching prolonged to tolerance.  Trials in clinic limited to around 10-15 seconds.     Neuro Re-ed: (to improve balance control, muscle activation) Tandem stance 1 min x 2 bilateral with occasional HHA on bar Step over and back weight shifting with clearance of 4 inch step in // bars with occasional HHA x 10 bilaterally    TherActivity: (to improve transfers, stairs, squatting, daily mobility ) Leg press double leg 75 lbs x 15, Rt leg only 2 x 15 37 lbs in available knee flexion  range.    Vaso 10 mins Rt knee in elevation 34 degrees medium compression .   TODAY'S TREATMENT                                                                          DATE: 03/26/2024 Therex: Supine Heel slide 2x10x3 Supine hamstring stretch with strap 2x30 Supine bridge to fatigue (10 reps) Supine clamshell green TB 2x10 Standing heel/toe raise 2x10  Neuromuscular Re-ed: Supine quad set 2x10x3 Supine SAQ 2x10x5 Supine SLR 2x10 Tandem stance 2x30 Standing slow march 2x10  Self Care: K tape in squid shape pattern to help with R ankle edema/pain  Vaso 10 mins Rt knee in elevation 34 degrees medium compression .  Self care and therex education performed during vaso.    TODAY'S  TREATMENT                                                                          DATE: 03/23/2024 Therex:    HEP instruction/performance c cues for techniques, handout provided.  Trial set performed of each for comprehension and symptom assessment.  See below for exercise list  Self Care: Edema control education for elevation , icing, ankle pumps.  Education on importance of mobility gains, specific extension for ambulation.   Gait Training Education on cane sequencing with placement in Lt UE.  Trial of household distances.    Vaso 10 mins Rt knee in elevation 34 degrees medium compression .  Self care and therex education performed during vaso.    PATIENT EDUCATION:  03/23/2024 Education details: HEP, POC Person educated: Patient Education method: Explanation, Demonstration, Verbal cues, and Handouts Education comprehension: verbalized understanding, returned demonstration, and verbal cues required  HOME EXERCISE PROGRAM: Access Code: [facility] URL: [email or web address] Date: 03/23/2024 Prepared by: Ozell Silvan  Exercises - Supine Heel Slide with Strap (Mirrored)  - 2-3 x daily - 7 x weekly - 1 sets - 10 reps - 5 hold - Supine Quadricep Sets  - 2-3 x daily - 7 x  weekly - 1 sets - 10 reps - 5 hold - Supine Knee Extension Mobilization with Weight  - 2-3 x daily - 7 x weekly - 1 sets - 1 reps - to tolerance up to 5 mins hold - Seated Long Arc Quad  - 3-5 x daily - 7 x weekly - 1 sets - 5-10 reps - 2 hold - Seated Knee Flexion AAROM  - 2-3 x daily - 7 x weekly - 1 sets - 5 reps - 15 hold - Seated Quad Set  - 2-3 x daily - 7 x weekly - 1 sets - 10 reps - 5 hold - Seated Knee Extension Stretch with Chair  - 2-3 x daily - 7 x weekly - 1 sets - 1 reps - to tolerance.  hold  ASSESSMENT:  CLINICAL IMPRESSION: Mild gains noted in ROM today compared to evaluation.  Introduction of WB strengthening and WB improvement balance intervention to help improve progressive mobility/ambulation control.  Continued skilled PT services indicated at this time to progress towards goals.   OBJECTIVE IMPAIRMENTS: Abnormal gait, decreased activity tolerance, decreased balance, decreased coordination, decreased endurance, decreased mobility, difficulty walking, decreased ROM, decreased strength, hypomobility, increased edema, increased fascial restrictions, impaired perceived functional ability, increased muscle spasms, impaired flexibility, improper body mechanics, and pain.   ACTIVITY LIMITATIONS: carrying, lifting, bending, sitting, standing, squatting, sleeping, stairs, transfers, bed mobility, and locomotion level  PARTICIPATION LIMITATIONS: meal prep, cleaning, laundry, interpersonal relationship, driving, shopping, community activity, and occupation  PERSONAL FACTORS: PMH positive for Lt TKA 2023, Lt breast CA, SBO, HTN.  are also affecting patient's functional outcome.   REHAB POTENTIAL: Good  CLINICAL DECISION MAKING: Stable/uncomplicated  EVALUATION COMPLEXITY: Low   GOALS: Goals reviewed with patient? Yes  SHORT TERM GOALS: (target date for Short term goals are 3 weeks 04/13/2024)   1.  Patient will demonstrate independent use of home exercise program to maintain  progress from in clinic treatments.  Goal status: on going 03/30/2024  LONG TERM GOALS: (target  dates for all long term goals are 12 weeks  06/15/2024 )   1. Patient will demonstrate/report pain at worst less than or equal to 2/10 to facilitate minimal limitation in daily activity secondary to pain symptoms.  Goal status: New   2. Patient will demonstrate independent use of home exercise program to facilitate ability to maintain/progress functional gains from skilled physical therapy services.  Goal status: New   3. Patient will demonstrate Patient specific functional scale avg > or = 8/10 to indicate reduced disability due to condition.   Goal status: New   4.  Patient will demonstrate Rt LE MMT 5/5 throughout to faciltiate usual transfers, stairs, squatting at [facility] for daily life.   Goal status: New   5.  Patient will demonstrate Rt knee AROM 0-110 deg to facilitate transfers, ambulation, stairs navigation at PLOF.  Goal status: New   6.  Patient will demonstrate ascending/descending stairs reciprocally s UE assist for community integration.   Goal status: New   7.  Patient will demonstrate independent ambulation community distances > 500 ft for community integration.  Goal Status: New   PLAN:  PT FREQUENCY: 1-2x/week  PT DURATION: 12 weeks  PLANNED INTERVENTIONS: Can include 97110-Therapeutic exercises, 97530- Therapeutic activity, 97112- Neuromuscular re-education, [phone]- Self Care, 02859- Manual therapy, [phone]- Gait training, [phone]- Electrical stimulation (unattended), 97750 Physical performance testing, 97016- Vasopneumatic device  Patient/Family education, Balance training, Stair training, Taping, Dry Needling, Joint mobilization, Joint manipulation, Spinal manipulation, Spinal mobilization, Scar mobilization, Vestibular training, Visual/preceptual remediation/compensation, DME instructions, Cryotherapy, and Moist heat.  All performed as medically necessary.  All included  unless contraindicated  PLAN FOR NEXT SESSION:  Continue review of extension stretching in HEP.  Progress mobility and strength.    Ozell Silvan, PT, DPT, OCS, ATC 03/30/24  11:30 AM    Referring diagnosis? M17.11 (ICD-10-CM) - Unilateral primary osteoarthritis, right knee Treatment diagnosis? (if different than referring diagnosis) M25.561, M25.661, M62.81, R26.2, R60.0 What was this (referring dx) caused by? [x]  Surgery []  Fall []  Ongoing issue []  Arthritis []  Other: ____________  Laterality: [x]  Rt []  Lt []  Both  Check all possible CPT codes:  *CHOOSE 10 OR LESS*    See Planned Interventions listed in the Plan section of the Evaluation.

## 2024-03-31 ENCOUNTER — Telehealth: Payer: Self-pay | Admitting: Adult Health

## 2024-03-31 NOTE — Telephone Encounter (Signed)
 Changed the patients appointment from in-person to a phone visit due to the patient recently having knee surgery. She is aware of the changes made to her upcoming appointment.

## 2024-04-06 ENCOUNTER — Ambulatory Visit: Admitting: Physical Therapy

## 2024-04-06 ENCOUNTER — Ambulatory Visit (HOSPITAL_BASED_OUTPATIENT_CLINIC_OR_DEPARTMENT_OTHER)

## 2024-04-06 ENCOUNTER — Encounter: Payer: Self-pay | Admitting: Physical Therapy

## 2024-04-06 DIAGNOSIS — M6281 Muscle weakness (generalized): Secondary | ICD-10-CM | POA: Diagnosis not present

## 2024-04-06 DIAGNOSIS — G8929 Other chronic pain: Secondary | ICD-10-CM

## 2024-04-06 DIAGNOSIS — R262 Difficulty in walking, not elsewhere classified: Secondary | ICD-10-CM

## 2024-04-06 DIAGNOSIS — R600 Localized edema: Secondary | ICD-10-CM

## 2024-04-06 DIAGNOSIS — M25661 Stiffness of right knee, not elsewhere classified: Secondary | ICD-10-CM

## 2024-04-06 DIAGNOSIS — M25561 Pain in right knee: Secondary | ICD-10-CM

## 2024-04-06 NOTE — Therapy (Signed)
 OUTPATIENT PHYSICAL THERAPY TREATMENT   Patient Name: Kelly Dixon MRN: [id number] DOB:March 22, 1946, 78 y.o., female Today's Date: 04/06/2024  END OF SESSION:  PT End of Session - 04/06/24 1149     Visit Number 4    Number of Visits 24    Date for PT Re-Evaluation 06/15/24    Authorization Type [employer] $25 copay    Progress Note Due on Visit 10    PT Start Time 1147    PT Stop Time 1230    PT Time Calculation (min) 43 min    Activity Tolerance Patient tolerated treatment well;No increased pain    Behavior During Therapy WFL for tasks assessed/performed          Past Medical History:  Diagnosis Date   Allergy 1996   Anemia    Arthritis    Breast cancer (HCC) 2020   right breast IDC   Breast cancer (HCC) 10/2023   left breast ILC   Cataract May 2018   Gallstones 05/31/2018   Hypertension    Personal history of radiation therapy    PONV (postoperative nausea and vomiting)    Syphilis    Past Surgical History:  Procedure Laterality Date   BREAST BIOPSY Right 08/2019   BREAST BIOPSY Left 10/02/2023   US  LT BREAST BX W LOC DEV 1ST LESION IMG BX SPEC US  GUIDE 10/02/2023 [facility]   BREAST BIOPSY  10/17/2023   MM LT RADIOACTIVE SEED LOC MAMMO GUIDE 10/17/2023 [facility]   BREAST LUMPECTOMY Right 08/2019   BREAST LUMPECTOMY WITH RADIOACTIVE SEED AND SENTINEL LYMPH NODE BIOPSY Right 09/15/2019   Procedure: RIGHT BREAST LUMPECTOMY WITH RADIOACTIVE SEED AND SENTINEL LYMPH NODE BIOPSY;  Surgeon: Belinda Cough, MD;  Location: [facility];  Service: General;  Laterality: Right;   BREAST LUMPECTOMY WITH RADIOACTIVE SEED LOCALIZATION Left 10/20/2023   Procedure: LEFT BREAST LUMPECTOMY WITH RADIOACTIVE SEED LOCALIZATION;  Surgeon: Belinda Cough, MD;  Location: [facility];  Service: General;  Laterality: Left;   CHOLECYSTECTOMY N/A 04/13/2019   Procedure: LAPAROSCOPIC CHOLECYSTECTOMY WITH INTRAOPERATIVE CHOLANGIOGRAM;  Surgeon: Belinda Cough, MD;  Location: [facility];  Service: General;  Laterality: N/A;   DILATION AND CURETTAGE OF UTERUS     JOINT REPLACEMENT  June 2023   RE-EXCISION OF BREAST LUMPECTOMY Left 11/04/2023   Procedure: RE-EXCISION OF INFERIOR MARGIN-LEFT BREAST LUMPECTOMY;  Surgeon: Belinda Cough, MD;  Location: [facility];  Service: General;  Laterality: Left;  LMA   TOTAL KNEE ARTHROPLASTY Left 03/26/2022   Procedure: LEFT TOTAL KNEE ARTHROPLASTY;  Surgeon: Vernetta Lonni GRADE, MD;  Location: [facility];  Service: Orthopedics;  Laterality: Left;   TOTAL KNEE ARTHROPLASTY Right 03/09/2024   Procedure: ARTHROPLASTY, KNEE, TOTAL;  Surgeon: Vernetta Lonni GRADE, MD;  Location: [facility];  Service: Orthopedics;  Laterality: Right;   TUBAL LIGATION  1974   Patient Active Problem List   Diagnosis Date Noted   Status post total right knee replacement 03/09/2024   Genetic testing 02/05/2024   Malignant neoplasm of upper-outer quadrant of left female breast (HCC) 10/07/2023   SBO (small bowel obstruction) (HCC) 09/28/2022   Status post total left knee replacement 03/26/2022   Unilateral primary osteoarthritis, right knee 01/22/2022   Bilateral primary osteoarthritis of knee 01/09/2022   Chronic pain of both knees 10/09/2020   Essential hypertension 09/04/2020   Malignant neoplasm of upper-outer quadrant of right breast in female, estrogen receptor positive (HCC) 09/08/2019    PCP: Frann Mabel Mt DO  REFERRING PROVIDER: Vernetta,  Lonni GRADE, MD  REFERRING DIAG: M17.11 (ICD-10-CM) - Unilateral primary osteoarthritis, right knee  THERAPY DIAG:  Chronic pain of right knee  Stiffness of right knee, not elsewhere classified  Muscle weakness (generalized)  Difficulty in walking, not elsewhere classified  Localized edema  Rationale for Evaluation and Treatment: Rehabilitation  ONSET DATE: Rt TKA 03/09/2024  SUBJECTIVE:   SUBJECTIVE STATEMENT: Pt reports no new complaints. Still wakes up in the middle of the  night.   PERTINENT HISTORY: PMH positive for Lt TKA 2023, Lt breast CA, SBO, HTN.   PAIN:  NPRS scale: 4/10 Pain location: Rt knee Pain description: constant, shooting at times, tightness, sore Aggravating factors: WB activity, random at night, inactivity, end range Relieving factors: medicine  PRECAUTIONS: None  WEIGHT BEARING RESTRICTIONS: No  FALLS:  Has patient fallen in last 6 months? No  LIVING ENVIRONMENT: Lives in: House/apartment Stairs: Flight of stairs to bedroom Has following equipment at home: FWW, [facility]  OCCUPATION: Substitute teaching  PLOF: Independent, swim, general exercise routine.   PATIENT GOALS: Reduce pain, back to exercise routine.   OBJECTIVE:   PATIENT SURVEYS:  Patient-Specific Activity Scoring Scheme  0 represents "unable to perform." 10 represents "able to perform at prior level. 0 1 2 3 4 5 6 7 8 9  10 (Date and Score)   Activity Eval  03/23/2024    1. Walking  6    2. Driving  0    3. Standing 3   4. Stairs  8.5   5.    Score 4.375    Total score = sum of the activity scores/number of activities Minimum detectable change (90%CI) for average score = 2 points Minimum detectable change (90%CI) for single activity score = 3 points  COGNITION: Overall cognitive status: WFL    SENSATION: 03/23/2024 Not tested  EDEMA:  03/23/2024: Localized edema Rt knee, lower leg noted.   MUSCLE LENGTH: 03/23/2024 No specific testing  POSTURE:  03/23/2024 Weight shift off Rt leg in standing.   PALPATION: 03/23/2024 Peri incision tenderness.   LOWER EXTREMITY ROM:   ROM Right Eval 03/23/2024 Left Eval 03/23/2024 Right 03/30/2024  Hip flexion     Hip extension     Hip abduction     Hip adduction     Hip internal rotation     Hip external rotation     Knee flexion 90 AROM in supine heel slide  96 AROM supine heel slide  Knee extension -16 AROM in seated LAQ  -12 in supine heel prop   -7 In supine heel prop  Ankle dorsiflexion      Ankle plantarflexion     Ankle inversion     Ankle eversion      (Blank rows = not tested)  LOWER EXTREMITY MMT:  MMT Right Eval 03/23/2024 Left Eval 03/23/2024  Hip flexion 4/5 5/5  Hip extension    Hip abduction    Hip adduction    Hip internal rotation    Hip external rotation    Knee flexion 5/5 5/5  Knee extension 3+/5 5/5  Ankle dorsiflexion 5/5 5/5  Ankle plantarflexion    Ankle inversion    Ankle eversion     (Blank rows = not tested)  LOWER EXTREMITY SPECIAL TESTS:  03/23/2024 No specific testing today  FUNCTIONAL TESTS:  03/23/2024 18 inch chair transfer: able to perform on 1st try but Lt leg dominant.  Lt SLS: < 5 seconds Rt SLS: unable unassisted TUG : FWW 14.97 seconds  GAIT: 03/23/2024  Arrival with FWW, able to perform safe ambulation c SPC in Lt UE.  Lacking TKE in stance, knee flexion in swing.                                                                                                                                                                        TODAY'S TREATMENT                                                                          DATE: 04/06/2024 Therex: UBE LE only seat 9 full revolutions lvl 3 x8 mins for ROM Standing gastroc stretch x 30 Standing soleus stretch x 30 Supine hamstring stretch with strap 2x 30 Prone knee hang 2x30 with self over pressure Prone quad stretch with strap 2x30  TherActivity: Knee ext machine 10lbs 2x10  Hamstring curl machine 15 lbs 2x10 Terminal knee ext against ball 2x10x5 Leg press double leg 100 lbs x 20, Rt leg only 2 x 10 50 lbs in available knee flexion range    TODAY'S TREATMENT                                                                          DATE: 03/30/2024 Therex: UBE LE only seat 10 full revolutions lvl 2.5 8 mins for ROM.  Seated Rt leg LAQ x 15 for ROM Supine heel slide 5 sec hold combo with quad set Rt leg 5 sec x10 each Time spent in education of continued emphasis  on extension stretching prolonged to tolerance.  Trials in clinic limited to around 10-15 seconds.    Neuro Re-ed: (to improve balance control, muscle activation) Tandem stance 1 min x 2 bilateral with occasional HHA on bar Step over and back weight shifting with clearance of 4 inch step in // bars with occasional HHA x 10 bilaterally   TherActivity: (to improve transfers, stairs, squatting, daily mobility ) Leg press double leg 75 lbs x 15, Rt leg only 2 x 15 37 lbs in available knee flexion range.   Vaso 10 mins Rt knee in elevation 34 degrees medium compression .   TODAY'S TREATMENT  DATE: 03/26/2024 Therex: Supine Heel slide 2x10x3 Supine hamstring stretch with strap 2x30 Supine bridge to fatigue (10 reps) Supine clamshell green TB 2x10 Standing heel/toe raise 2x10  Neuromuscular Re-ed: Supine quad set 2x10x3 Supine SAQ 2x10x5 Supine SLR 2x10 Tandem stance 2x30 Standing slow march 2x10  Self Care: K tape in squid shape pattern to help with R ankle edema/pain  Vaso 10 mins Rt knee in elevation 34 degrees medium compression .  Self care and therex education performed during vaso.    TODAY'S TREATMENT                                                                          DATE: 03/23/2024 Therex:    HEP instruction/performance c cues for techniques, handout provided.  Trial set performed of each for comprehension and symptom assessment.  See below for exercise list  Self Care: Edema control education for elevation , icing, ankle pumps.  Education on importance of mobility gains, specific extension for ambulation.   Gait Training Education on cane sequencing with placement in Lt UE.  Trial of household distances.    Vaso 10 mins Rt knee in elevation 34 degrees medium compression .  Self care and therex education performed during vaso.    PATIENT EDUCATION:  Education details: HEP, POC Person  educated: Patient Education method: Programmer, multimedia, Demonstration, Verbal cues, and Handouts Education comprehension: verbalized understanding, returned demonstration, and verbal cues required  HOME EXERCISE PROGRAM: Access Code: [email or web address] URL: [email or web address] Date: 03/23/2024 Prepared by: Ozell Silvan  Exercises - Supine Heel Slide with Strap (Mirrored)  - 2-3 x daily - 7 x weekly - 1 sets - 10 reps - 5 hold - Supine Quadricep Sets  - 2-3 x daily - 7 x weekly - 1 sets - 10 reps - 5 hold - Supine Knee Extension Mobilization with Weight  - 2-3 x daily - 7 x weekly - 1 sets - 1 reps - to tolerance up to 5 mins hold - Seated Long Arc Quad  - 3-5 x daily - 7 x weekly - 1 sets - 5-10 reps - 2 hold - Seated Knee Flexion AAROM  - 2-3 x daily - 7 x weekly - 1 sets - 5 reps - 15 hold - Seated Quad Set  - 2-3 x daily - 7 x weekly - 1 sets - 10 reps - 5 hold - Seated Knee Extension Stretch with Chair  - 2-3 x daily - 7 x weekly - 1 sets - 1 reps - to tolerance.  hold  ASSESSMENT:  CLINICAL IMPRESSION: Continued to work on knee ROM and strength. Pt is interested in returning to the gym so trialed gym equipment today.   OBJECTIVE IMPAIRMENTS: Abnormal gait, decreased activity tolerance, decreased balance, decreased coordination, decreased endurance, decreased mobility, difficulty walking, decreased ROM, decreased strength, hypomobility, increased edema, increased fascial restrictions, impaired perceived functional ability, increased muscle spasms, impaired flexibility, improper body mechanics, and pain.   ACTIVITY LIMITATIONS: carrying, lifting, bending, sitting, standing, squatting, sleeping, stairs, transfers, bed mobility, and locomotion level  PARTICIPATION LIMITATIONS: meal prep, cleaning, laundry, interpersonal relationship, driving, shopping, community activity, and occupation  PERSONAL FACTORS: PMH positive for Lt TKA 2023, Lt breast CA, SBO, HTN.  are also affecting  patient's functional outcome.   REHAB POTENTIAL: Good  CLINICAL DECISION MAKING: Stable/uncomplicated  EVALUATION COMPLEXITY: Low   GOALS: Goals reviewed with patient? Yes  SHORT TERM GOALS: (target date for Short term goals are 3 weeks 04/13/2024)   1.  Patient will demonstrate independent use of home exercise program to maintain progress from in clinic treatments.  Goal status: on going 03/30/2024  LONG TERM GOALS: (target dates for all long term goals are 12 weeks  06/15/2024 )   1. Patient will demonstrate/report pain at worst less than or equal to 2/10 to facilitate minimal limitation in daily activity secondary to pain symptoms.  Goal status: New   2. Patient will demonstrate independent use of home exercise program to facilitate ability to maintain/progress functional gains from skilled physical therapy services.  Goal status: New   3. Patient will demonstrate Patient specific functional scale avg > or = 8/10 to indicate reduced disability due to condition.   Goal status: New   4.  Patient will demonstrate Rt LE MMT 5/5 throughout to faciltiate usual transfers, stairs, squatting at [facility] for daily life.   Goal status: New   5.  Patient will demonstrate Rt knee AROM 0-110 deg to facilitate transfers, ambulation, stairs navigation at PLOF.  Goal status: New   6.  Patient will demonstrate ascending/descending stairs reciprocally s UE assist for community integration.   Goal status: New   7.  Patient will demonstrate independent ambulation community distances > 500 ft for community integration.  Goal Status: New   PLAN:  PT FREQUENCY: 1-2x/week  PT DURATION: 12 weeks  PLANNED INTERVENTIONS: Can include 97110-Therapeutic exercises, 97530- Therapeutic activity, 97112- Neuromuscular re-education, [phone]- Self Care, 02859- Manual therapy, [phone]- Gait training, [phone]- Electrical stimulation (unattended), 97750 Physical performance testing, 97016- Vasopneumatic  device  Patient/Family education, Balance training, Stair training, Taping, Dry Needling, Joint mobilization, Joint manipulation, Spinal manipulation, Spinal mobilization, Scar mobilization, Vestibular training, Visual/preceptual remediation/compensation, DME instructions, Cryotherapy, and Moist heat.  All performed as medically necessary.  All included unless contraindicated  PLAN FOR NEXT SESSION:  Continue review of extension stretching in HEP.  Progress mobility and strength.    Shoua Ressler April Ma L Mung Rinker, PT, DPT 04/06/24  11:49 AM    Referring diagnosis? M17.11 (ICD-10-CM) - Unilateral primary osteoarthritis, right knee Treatment diagnosis? (if different than referring diagnosis) M25.561, M25.661, M62.81, R26.2, R60.0 What was this (referring dx) caused by? [x]  Surgery []  Fall []  Ongoing issue []  Arthritis []  Other: ____________  Laterality: [x]  Rt []  Lt []  Both  Check all possible CPT codes:  *CHOOSE 10 OR LESS*    See Planned Interventions listed in the Plan section of the Evaluation.

## 2024-04-08 ENCOUNTER — Encounter: Payer: Self-pay | Admitting: Rehabilitative and Restorative Service Providers"

## 2024-04-08 ENCOUNTER — Ambulatory Visit: Admitting: Rehabilitative and Restorative Service Providers"

## 2024-04-08 DIAGNOSIS — M25661 Stiffness of right knee, not elsewhere classified: Secondary | ICD-10-CM | POA: Diagnosis not present

## 2024-04-08 DIAGNOSIS — M25561 Pain in right knee: Secondary | ICD-10-CM

## 2024-04-08 DIAGNOSIS — G8929 Other chronic pain: Secondary | ICD-10-CM | POA: Diagnosis not present

## 2024-04-08 DIAGNOSIS — M6281 Muscle weakness (generalized): Secondary | ICD-10-CM

## 2024-04-08 DIAGNOSIS — R262 Difficulty in walking, not elsewhere classified: Secondary | ICD-10-CM | POA: Diagnosis not present

## 2024-04-08 DIAGNOSIS — R600 Localized edema: Secondary | ICD-10-CM

## 2024-04-08 NOTE — Therapy (Addendum)
 OUTPATIENT PHYSICAL THERAPY TREATMENT   Patient Name: Kelly Dixon MRN: [id number] DOB:06-20-1946, 78 y.o., female Today's Date: 04/14/2024  END OF SESSION:     Past Medical History:  Diagnosis Date   Allergy 1996   Anemia    Arthritis    Breast cancer (HCC) 2020   right breast IDC   Breast cancer (HCC) 10/2023   left breast ILC   Cataract May 2018   Gallstones 05/31/2018   Hypertension    Personal history of radiation therapy    PONV (postoperative nausea and vomiting)    Syphilis    Past Surgical History:  Procedure Laterality Date   BREAST BIOPSY Right 08/2019   BREAST BIOPSY Left 10/02/2023   US  LT BREAST BX W LOC DEV 1ST LESION IMG BX SPEC US  GUIDE 10/02/2023 [facility]   BREAST BIOPSY  10/17/2023   MM LT RADIOACTIVE SEED LOC MAMMO GUIDE 10/17/2023 [facility]   BREAST LUMPECTOMY Right 08/2019   BREAST LUMPECTOMY WITH RADIOACTIVE SEED AND SENTINEL LYMPH NODE BIOPSY Right 09/15/2019   Procedure: RIGHT BREAST LUMPECTOMY WITH RADIOACTIVE SEED AND SENTINEL LYMPH NODE BIOPSY;  Surgeon: Belinda Cough, MD;  Location: [facility];  Service: General;  Laterality: Right;   BREAST LUMPECTOMY WITH RADIOACTIVE SEED LOCALIZATION Left 10/20/2023   Procedure: LEFT BREAST LUMPECTOMY WITH RADIOACTIVE SEED LOCALIZATION;  Surgeon: Belinda Cough, MD;  Location: [facility];  Service: General;  Laterality: Left;   CHOLECYSTECTOMY N/A 04/13/2019   Procedure: LAPAROSCOPIC CHOLECYSTECTOMY WITH INTRAOPERATIVE CHOLANGIOGRAM;  Surgeon: Belinda Cough, MD;  Location: [facility] OR;  Service: General;  Laterality: N/A;   DILATION AND CURETTAGE OF UTERUS     JOINT REPLACEMENT  June 2023   RE-EXCISION OF BREAST LUMPECTOMY Left 11/04/2023   Procedure: RE-EXCISION OF INFERIOR MARGIN-LEFT BREAST LUMPECTOMY;  Surgeon: Belinda Cough, MD;  Location: [facility];  Service: General;  Laterality: Left;  LMA   TOTAL KNEE ARTHROPLASTY Left 03/26/2022   Procedure: LEFT TOTAL KNEE  ARTHROPLASTY;  Surgeon: Vernetta Lonni GRADE, MD;  Location: [facility] OR;  Service: Orthopedics;  Laterality: Left;   TOTAL KNEE ARTHROPLASTY Right 03/09/2024   Procedure: ARTHROPLASTY, KNEE, TOTAL;  Surgeon: Vernetta Lonni GRADE, MD;  Location: [facility];  Service: Orthopedics;  Laterality: Right;   TUBAL LIGATION  1974   Patient Active Problem List   Diagnosis Date Noted   Status post total right knee replacement 03/09/2024   Genetic testing 02/05/2024   Malignant neoplasm of upper-outer quadrant of left female breast (HCC) 10/07/2023   SBO (small bowel obstruction) (HCC) 09/28/2022   Status post total left knee replacement 03/26/2022   Unilateral primary osteoarthritis, right knee 01/22/2022   Bilateral primary osteoarthritis of knee 01/09/2022   Chronic pain of both knees 10/09/2020   Essential hypertension 09/04/2020   Malignant neoplasm of upper-outer quadrant of right breast in female, estrogen receptor positive (HCC) 09/08/2019    PCP: Frann Mabel Mt DO  REFERRING PROVIDER: Vernetta Lonni GRADE, MD  REFERRING DIAG: M17.11 (ICD-10-CM) - Unilateral primary osteoarthritis, right knee  THERAPY DIAG:  Chronic pain of right knee  Stiffness of right knee, not elsewhere classified  Muscle weakness (generalized)  Difficulty in walking, not elsewhere classified  Localized edema  Rationale for Evaluation and Treatment: Rehabilitation  ONSET DATE: Rt TKA 03/09/2024  SUBJECTIVE:   SUBJECTIVE STATEMENT: Kelly Dixon is managing with tylenol .  She is getting 4-5 hours of uninterrupted sleep.    PERTINENT HISTORY: PMH positive for Lt TKA 2023, Lt breast CA, SBO, HTN.  PAIN:  NPRS scale: 0-4/10 this week Pain location: Rt knee Pain description: Ache, sore, stiff Aggravating factors: WB activity, random at night, inactivity, end range Relieving factors: medicine  PRECAUTIONS: None  WEIGHT BEARING RESTRICTIONS: No  FALLS:  Has patient fallen in last 6 months?  No  LIVING ENVIRONMENT: Lives in: House/apartment Stairs: Flight of stairs to bedroom Has following equipment at home: FWW, [facility]  OCCUPATION: Substitute teaching  PLOF: Independent, swim, general exercise routine.   PATIENT GOALS: Reduce pain, back to exercise routine.   OBJECTIVE:   PATIENT SURVEYS:  Patient-Specific Activity Scoring Scheme  0 represents "unable to perform." 10 represents "able to perform at prior level. 0 1 2 3 4 5 6 7 8 9  10 (Date and Score)   Activity Eval  03/23/2024 04/08/2024   1. Walking  6  9  2. Driving  0  10  3. Standing 3 5  4. Stairs  8.5 9  5.    Score 4.375 8.25   Total score = sum of the activity scores/number of activities Minimum detectable change (90%CI) for average score = 2 points Minimum detectable change (90%CI) for single activity score = 3 points  COGNITION: Overall cognitive status: WFL    SENSATION: 03/23/2024 Not tested  EDEMA:  03/23/2024: Localized edema Rt knee, lower leg noted.   MUSCLE LENGTH: 03/23/2024 No specific testing  POSTURE:  03/23/2024 Weight shift off Rt leg in standing.   PALPATION: 03/23/2024 Peri incision tenderness.   LOWER EXTREMITY ROM:   ROM Right Eval 03/23/2024 Left Eval 03/23/2024 Right 03/30/2024 Right 04/08/2024  Hip flexion      Hip extension      Hip abduction      Hip adduction      Hip internal rotation      Hip external rotation      Knee flexion 90 AROM in supine heel slide  96 AROM supine heel slide 100  Knee extension -16 AROM in seated LAQ  -12 in supine heel prop   -7 In supine heel prop -6  Ankle dorsiflexion      Ankle plantarflexion      Ankle inversion      Ankle eversion       (Blank rows = not tested)  LOWER EXTREMITY MMT:  MMT Right Eval 03/23/2024 Left Eval 03/23/2024  Hip flexion 4/5 5/5  Hip extension    Hip abduction    Hip adduction    Hip internal rotation    Hip external rotation    Knee flexion 5/5 5/5  Knee extension 3+/5 5/5   Ankle dorsiflexion 5/5 5/5  Ankle plantarflexion    Ankle inversion    Ankle eversion     (Blank rows = not tested)  LOWER EXTREMITY SPECIAL TESTS:  03/23/2024 No specific testing today  FUNCTIONAL TESTS:  03/23/2024 18 inch chair transfer: able to perform on 1st try but Lt leg dominant.  Lt SLS: < 5 seconds Rt SLS: unable unassisted TUG : FWW 14.97 seconds  GAIT: 03/23/2024 Arrival with FWW, able to perform safe ambulation c SPC in Lt UE.  Lacking TKE in stance, knee flexion in swing.  TODAY'S TREATMENT                                                                          DATE:  04/08/2024 Recumbent bike Seat 7 for 5 minutes focus on reaching into extension Prone quadriceps stretch 5 x 20 seconds Prone knee extension stretch 3 minutes with 1# Seated knee extension stretch with 4# for 3 minutes  Functional Activities: Double leg Press 75# 20 reps full extension and slow eccentrics Single leg Press 37# 15 reps full extension and slow eccentrics Step-down off 4 and 6 inch step 10 x each Step-up and over 6 and 8 inch step 10 x each Step-up and over hurdles without circumduction (needs work) 5 laps forward and back  Vaso Right Knee Medium 34* 10 minutes   04/06/2024 Therex: UBE LE only seat 9 full revolutions lvl 3 x8 mins for ROM Standing gastroc stretch x 30 Standing soleus stretch x 30 Supine hamstring stretch with strap 2x 30 Prone knee hang 2x30 with self over pressure Prone quad stretch with strap 2x30  TherActivity: Knee ext machine 10lbs 2x10  Hamstring curl machine 15 lbs 2x10 Terminal knee ext against ball 2x10x5 Leg press double leg 100 lbs x 20, Rt leg only 2 x 10 50 lbs in available knee flexion range   03/30/2024 Therex: UBE LE only seat 10 full revolutions lvl 2.5 8 mins for ROM.   Seated Rt leg LAQ x 15 for ROM Supine heel slide 5 sec hold combo with quad set Rt leg 5 sec x10 each Time spent in education of continued emphasis on extension stretching prolonged to tolerance.  Trials in clinic limited to around 10-15 seconds.    Neuro Re-ed: (to improve balance control, muscle activation) Tandem stance 1 min x 2 bilateral with occasional HHA on bar Step over and back weight shifting with clearance of 4 inch step in // bars with occasional HHA x 10 bilaterally   TherActivity: (to improve transfers, stairs, squatting, daily mobility ) Leg press double leg 75 lbs x 15, Rt leg only 2 x 15 37 lbs in available knee flexion range.   Vaso 10 mins Rt knee in elevation 34 degrees medium compression .   PATIENT EDUCATION:  Education details: HEP, POC Person educated: Patient Education method: Programmer, multimedia, Demonstration, Verbal cues, and Handouts Education comprehension: verbalized understanding, returned demonstration, and verbal cues required  HOME EXERCISE PROGRAM: Access Code: [email or web address] URL: [email or web address] Date: 03/23/2024 Prepared by: Ozell Silvan  Exercises - Supine Heel Slide with Strap (Mirrored)  - 2-3 x daily - 7 x weekly - 1 sets - 10 reps - 5 hold - Supine Quadricep Sets  - 2-3 x daily - 7 x weekly - 1 sets - 10 reps - 5 hold - Supine Knee Extension Mobilization with Weight  - 2-3 x daily - 7 x weekly - 1 sets - 1 reps - to tolerance up to 5 mins hold - Seated Long Arc Quad  - 3-5 x daily - 7 x weekly - 1 sets - 5-10 reps - 2 hold - Seated Knee Flexion AAROM  - 2-3 x daily - 7 x weekly - 1 sets - 5 reps - 15 hold - Seated Quad Set  -  2-3 x daily - 7 x weekly - 1 sets - 10 reps - 5 hold - Seated Knee Extension Stretch with Chair  - 2-3 x daily - 7 x weekly - 1 sets - 1 reps - to tolerance.  hold  ASSESSMENT:  CLINICAL IMPRESSION: AROM was 0 - 6 - 100 degrees today.  Trude demonstrates and reports good HEP compliance.  We discussed  AROM is still priority #1, although quadriceps strength and avoiding too much weight-bearing are still important.  Continue supervised PT to meet LTGs.  OBJECTIVE IMPAIRMENTS: Abnormal gait, decreased activity tolerance, decreased balance, decreased coordination, decreased endurance, decreased mobility, difficulty walking, decreased ROM, decreased strength, hypomobility, increased edema, increased fascial restrictions, impaired perceived functional ability, increased muscle spasms, impaired flexibility, improper body mechanics, and pain.   ACTIVITY LIMITATIONS: carrying, lifting, bending, sitting, standing, squatting, sleeping, stairs, transfers, bed mobility, and locomotion level  PARTICIPATION LIMITATIONS: meal prep, cleaning, laundry, interpersonal relationship, driving, shopping, community activity, and occupation  PERSONAL FACTORS: PMH positive for Lt TKA 2023, Lt breast CA, SBO, HTN.  are also affecting patient's functional outcome.   REHAB POTENTIAL: Good  CLINICAL DECISION MAKING: Stable/uncomplicated  EVALUATION COMPLEXITY: Low   GOALS: Goals reviewed with patient? Yes  SHORT TERM GOALS: (target date for Short term goals are 3 weeks 04/13/2024)   1.  Patient will demonstrate independent use of home exercise program to maintain progress from in clinic treatments.  Goal status: Met 04/08/2024  LONG TERM GOALS: (target dates for all long term goals are 12 weeks  06/15/2024 )   1. Patient will demonstrate/report pain at worst less than or equal to 2/10 to facilitate minimal limitation in daily activity secondary to pain symptoms.  Goal status: New   2. Patient will demonstrate independent use of home exercise program to facilitate ability to maintain/progress functional gains from skilled physical therapy services.  Goal status: New   3. Patient will demonstrate Patient specific functional scale avg > or = 8/10 to indicate reduced disability due to condition.   Goal status:  New   4.  Patient will demonstrate Rt LE MMT 5/5 throughout to faciltiate usual transfers, stairs, squatting at [facility] for daily life.   Goal status: New   5.  Patient will demonstrate Rt knee AROM 0-110 deg to facilitate transfers, ambulation, stairs navigation at PLOF.  Goal status: New   6.  Patient will demonstrate ascending/descending stairs reciprocally s UE assist for community integration.   Goal status: New   7.  Patient will demonstrate independent ambulation community distances > 500 ft for community integration.  Goal Status: New   PLAN:  PT FREQUENCY: 1-2x/week  PT DURATION: 12 weeks  PLANNED INTERVENTIONS: Can include 97110-Therapeutic exercises, 97530- Therapeutic activity, 97112- Neuromuscular re-education, [phone]- Self Care, 02859- Manual therapy, [phone]- Gait training, [phone]- Electrical stimulation (unattended), 97750 Physical performance testing, 97016- Vasopneumatic device  Patient/Family education, Balance training, Stair training, Taping, Dry Needling, Joint mobilization, Joint manipulation, Spinal manipulation, Spinal mobilization, Scar mobilization, Vestibular training, Visual/preceptual remediation/compensation, DME instructions, Cryotherapy, and Moist heat.  All performed as medically necessary.  All included unless contraindicated  PLAN FOR NEXT SESSION:  AROM (extension emphasis), quadriceps strength, functional progressions as appropriate.   Myer LELON Ivory, PT, MPT 04/14/24  2:13 PM    Referring diagnosis? M17.11 (ICD-10-CM) - Unilateral primary osteoarthritis, right knee Treatment diagnosis? (if different than referring diagnosis) M25.561, M25.661, M62.81, R26.2, R60.0 What was this (referring dx) caused by? [x]  Surgery []  Fall []  Ongoing issue []  Arthritis []   Other: ____________  Laterality: [x]  Rt []  Lt []  Both  Check all possible CPT codes:  *CHOOSE 10 OR LESS*    See Planned Interventions listed in the Plan section of the Evaluation.

## 2024-04-12 ENCOUNTER — Encounter: Payer: Self-pay | Admitting: Physical Therapy

## 2024-04-12 ENCOUNTER — Ambulatory Visit: Admitting: Physical Therapy

## 2024-04-12 DIAGNOSIS — G8929 Other chronic pain: Secondary | ICD-10-CM

## 2024-04-12 DIAGNOSIS — R600 Localized edema: Secondary | ICD-10-CM | POA: Diagnosis not present

## 2024-04-12 DIAGNOSIS — R262 Difficulty in walking, not elsewhere classified: Secondary | ICD-10-CM | POA: Diagnosis not present

## 2024-04-12 DIAGNOSIS — M25661 Stiffness of right knee, not elsewhere classified: Secondary | ICD-10-CM | POA: Diagnosis not present

## 2024-04-12 DIAGNOSIS — M25561 Pain in right knee: Secondary | ICD-10-CM

## 2024-04-12 DIAGNOSIS — M6281 Muscle weakness (generalized): Secondary | ICD-10-CM | POA: Diagnosis not present

## 2024-04-12 NOTE — Therapy (Signed)
 OUTPATIENT PHYSICAL THERAPY TREATMENT   Patient Name: Kelly Dixon MRN: [id number] DOB:1945-12-16, 78 y.o., female Today's Date: 04/12/2024  END OF SESSION:  PT End of Session - 04/12/24 1428     Visit Number 5    Number of Visits 24    Date for PT Re-Evaluation 06/15/24    Authorization Type [employer] $25 copay    Progress Note Due on Visit 10    PT Start Time 1428    PT Stop Time 1515    PT Time Calculation (min) 47 min    Activity Tolerance Patient tolerated treatment well;No increased pain    Behavior During Therapy WFL for tasks assessed/performed            Past Medical History:  Diagnosis Date   Allergy 1996   Anemia    Arthritis    Breast cancer (HCC) 2020   right breast IDC   Breast cancer (HCC) 10/2023   left breast ILC   Cataract May 2018   Gallstones 05/31/2018   Hypertension    Personal history of radiation therapy    PONV (postoperative nausea and vomiting)    Syphilis    Past Surgical History:  Procedure Laterality Date   BREAST BIOPSY Right 08/2019   BREAST BIOPSY Left 10/02/2023   US  LT BREAST BX W LOC DEV 1ST LESION IMG BX SPEC US  GUIDE 10/02/2023 [facility]   BREAST BIOPSY  10/17/2023   MM LT RADIOACTIVE SEED LOC MAMMO GUIDE 10/17/2023 [facility]   BREAST LUMPECTOMY Right 08/2019   BREAST LUMPECTOMY WITH RADIOACTIVE SEED AND SENTINEL LYMPH NODE BIOPSY Right 09/15/2019   Procedure: RIGHT BREAST LUMPECTOMY WITH RADIOACTIVE SEED AND SENTINEL LYMPH NODE BIOPSY;  Surgeon: Belinda Cough, MD;  Location: [facility];  Service: General;  Laterality: Right;   BREAST LUMPECTOMY WITH RADIOACTIVE SEED LOCALIZATION Left 10/20/2023   Procedure: LEFT BREAST LUMPECTOMY WITH RADIOACTIVE SEED LOCALIZATION;  Surgeon: Belinda Cough, MD;  Location: [facility];  Service: General;  Laterality: Left;   CHOLECYSTECTOMY N/A 04/13/2019   Procedure: LAPAROSCOPIC CHOLECYSTECTOMY WITH INTRAOPERATIVE CHOLANGIOGRAM;  Surgeon: Belinda Cough, MD;  Location:  [facility];  Service: General;  Laterality: N/A;   DILATION AND CURETTAGE OF UTERUS     JOINT REPLACEMENT  June 2023   RE-EXCISION OF BREAST LUMPECTOMY Left 11/04/2023   Procedure: RE-EXCISION OF INFERIOR MARGIN-LEFT BREAST LUMPECTOMY;  Surgeon: Belinda Cough, MD;  Location: [facility];  Service: General;  Laterality: Left;  LMA   TOTAL KNEE ARTHROPLASTY Left 03/26/2022   Procedure: LEFT TOTAL KNEE ARTHROPLASTY;  Surgeon: Vernetta Lonni GRADE, MD;  Location: [facility];  Service: Orthopedics;  Laterality: Left;   TOTAL KNEE ARTHROPLASTY Right 03/09/2024   Procedure: ARTHROPLASTY, KNEE, TOTAL;  Surgeon: Vernetta Lonni GRADE, MD;  Location: [facility];  Service: Orthopedics;  Laterality: Right;   TUBAL LIGATION  1974   Patient Active Problem List   Diagnosis Date Noted   Status post total right knee replacement 03/09/2024   Genetic testing 02/05/2024   Malignant neoplasm of upper-outer quadrant of left female breast (HCC) 10/07/2023   SBO (small bowel obstruction) (HCC) 09/28/2022   Status post total left knee replacement 03/26/2022   Unilateral primary osteoarthritis, right knee 01/22/2022   Bilateral primary osteoarthritis of knee 01/09/2022   Chronic pain of both knees 10/09/2020   Essential hypertension 09/04/2020   Malignant neoplasm of upper-outer quadrant of right breast in female, estrogen receptor positive (HCC) 09/08/2019    PCP: Frann Mabel Mt DO  REFERRING  PROVIDER: Vernetta Lonni GRADE, MD  REFERRING DIAG: M17.11 (ICD-10-CM) - Unilateral primary osteoarthritis, right knee  THERAPY DIAG:  Chronic pain of right knee  Stiffness of right knee, not elsewhere classified  Muscle weakness (generalized)  Difficulty in walking, not elsewhere classified  Localized edema  Rationale for Evaluation and Treatment: Rehabilitation  ONSET DATE: Rt TKA 03/09/2024  SUBJECTIVE:   SUBJECTIVE STATEMENT:  Pt reports 7/10 pain and 9/10 with motion.  Any ADLS that  require straightening the knee are difficult.    PERTINENT HISTORY: PMH positive for Lt TKA 2023, Lt breast CA, SBO, HTN.   PAIN:  NPRS scale: 0-4/10 this week Pain location: Rt knee Pain description: Ache, sore, stiff Aggravating factors: WB activity, random at night, inactivity, end range Relieving factors: medicine  PRECAUTIONS: None  WEIGHT BEARING RESTRICTIONS: No  FALLS:  Has patient fallen in last 6 months? No  LIVING ENVIRONMENT: Lives in: House/apartment Stairs: Flight of stairs to bedroom Has following equipment at home: FWW, [facility]  OCCUPATION: Substitute teaching  PLOF: Independent, swim, general exercise routine.   PATIENT GOALS: Reduce pain, back to exercise routine.   OBJECTIVE:   PATIENT SURVEYS:  Patient-Specific Activity Scoring Scheme  0 represents "unable to perform." 10 represents "able to perform at prior level. 0 1 2 3 4 5 6 7 8 9  10 (Date and Score)   Activity Eval  03/23/2024 04/08/2024   1. Walking  6  9  2. Driving  0  10  3. Standing 3 5  4. Stairs  8.5 9  5.    Score 4.375 8.25   Total score = sum of the activity scores/number of activities Minimum detectable change (90%CI) for average score = 2 points Minimum detectable change (90%CI) for single activity score = 3 points  COGNITION: Overall cognitive status: WFL    SENSATION: 03/23/2024 Not tested  EDEMA:  03/23/2024: Localized edema Rt knee, lower leg noted.   MUSCLE LENGTH: 03/23/2024 No specific testing  POSTURE:  03/23/2024 Weight shift off Rt leg in standing.   PALPATION: 03/23/2024 Peri incision tenderness.   LOWER EXTREMITY ROM:   ROM Right Eval 03/23/2024 Left Eval 03/23/2024 Right 03/30/2024 Right 04/08/2024 Right 04/12/24  Hip flexion       Hip extension       Hip abduction       Hip adduction       Hip internal rotation       Hip external rotation       Knee flexion 90 AROM in supine heel slide  96 AROM supine heel slide 100 Supine heel slides  AA:  98  Knee extension -16 AROM in seated LAQ  -12 in supine heel prop   -7 In supine heel prop -6 -8 In supine with towel roll under ankle  Ankle dorsiflexion       Ankle plantarflexion       Ankle inversion       Ankle eversion        (Blank rows = not tested)  LOWER EXTREMITY MMT:  MMT Right Eval 03/23/2024 Left Eval 03/23/2024  Hip flexion 4/5 5/5  Hip extension    Hip abduction    Hip adduction    Hip internal rotation    Hip external rotation    Knee flexion 5/5 5/5  Knee extension 3+/5 5/5  Ankle dorsiflexion 5/5 5/5  Ankle plantarflexion    Ankle inversion    Ankle eversion     (Blank rows = not tested)  LOWER EXTREMITY SPECIAL TESTS:  03/23/2024 No specific testing today  FUNCTIONAL TESTS:  03/23/2024 18 inch chair transfer: able to perform on 1st try but Lt leg dominant.  Lt SLS: < 5 seconds Rt SLS: unable unassisted TUG : FWW 14.97 seconds  GAIT: 03/23/2024 Arrival with FWW, able to perform safe ambulation c SPC in Lt UE.  Lacking TKE in stance, knee flexion in swing.                                                                                                                                                                         TODAY'S TREATMENT                                                                          DATE:  04/12/24 There ex: Rec bike seat 6 minutes partial revolutions progressing to full Seated Rt leg LAQ x 15 for ROM Gastroc stretch x 60 seconds Supine heel slides with active flexion using strap TherAct: Leg press 81# double leg 2x10, 43# single leg 2x10 Step ups 6 inch step alternating Cone taps with R heel crossing midline  Side stepping with G tband 2 cycles TRX squats 2x10 Modalities:  Vasopneumatic: 34 deg x 10 min, medium compression      04/08/2024 Recumbent bike Seat 7 for 5 minutes focus on reaching into extension Prone quadriceps stretch 5 x 20 seconds Prone knee extension stretch 3 minutes with 1# Seated  knee extension stretch with 4# for 3 minutes  Functional Activities: Double leg Press 75# 20 reps full extension and slow eccentrics Single leg Press 37# 15 reps full extension and slow eccentrics Step-down off 4 and 6 inch step 10 x each Step-up and over 6 and 8 inch step 10 x each Step-up and over hurdles without circumduction (needs work) 5 laps forward and back  Vaso Right Knee Medium 34* 10 minutes   04/06/2024 Therex: UBE LE only seat 9 full revolutions lvl 3 x8 mins for ROM Standing gastroc stretch x 30 Standing soleus stretch x 30 Supine hamstring stretch with strap 2x 30 Prone knee hang 2x30 with self over pressure Prone quad stretch with strap 2x30  TherActivity: Knee ext machine 10lbs 2x10  Hamstring curl machine 15 lbs 2x10 Terminal knee ext against ball 2x10x5 Leg press double leg 100 lbs x 20, Rt leg only 2 x 10 50 lbs in available knee flexion range   03/30/2024 Therex: UBE LE only seat  10 full revolutions lvl 2.5 8 mins for ROM.  Seated Rt leg LAQ x 15 for ROM Supine heel slide 5 sec hold combo with quad set Rt leg 5 sec x10 each Time spent in education of continued emphasis on extension stretching prolonged to tolerance.  Trials in clinic limited to around 10-15 seconds.    Neuro Re-ed: (to improve balance control, muscle activation) Tandem stance 1 min x 2 bilateral with occasional HHA on bar Step over and back weight shifting with clearance of 4 inch step in // bars with occasional HHA x 10 bilaterally   TherActivity: (to improve transfers, stairs, squatting, daily mobility ) Leg press double leg 75 lbs x 15, Rt leg only 2 x 15 37 lbs in available knee flexion range.   Vaso 10 mins Rt knee in elevation 34 degrees medium compression .   PATIENT EDUCATION:  Education details: HEP, POC Person educated: Patient Education method: Programmer, multimedia, Demonstration, Verbal cues, and Handouts Education comprehension: verbalized understanding, returned  demonstration, and verbal cues required  HOME EXERCISE PROGRAM: Access Code: [email or web address] URL: [email or web address] Date: 03/23/2024 Prepared by: Ozell Silvan  Exercises - Supine Heel Slide with Strap (Mirrored)  - 2-3 x daily - 7 x weekly - 1 sets - 10 reps - 5 hold - Supine Quadricep Sets  - 2-3 x daily - 7 x weekly - 1 sets - 10 reps - 5 hold - Supine Knee Extension Mobilization with Weight  - 2-3 x daily - 7 x weekly - 1 sets - 1 reps - to tolerance up to 5 mins hold - Seated Long Arc Quad  - 3-5 x daily - 7 x weekly - 1 sets - 5-10 reps - 2 hold - Seated Knee Flexion AAROM  - 2-3 x daily - 7 x weekly - 1 sets - 5 reps - 15 hold - Seated Quad Set  - 2-3 x daily - 7 x weekly - 1 sets - 10 reps - 5 hold - Seated Knee Extension Stretch with Chair  - 2-3 x daily - 7 x weekly - 1 sets - 1 reps - to tolerance.  hold  ASSESSMENT:  CLINICAL IMPRESSION: After working on bike , pt reports pain decreased from 9/10 to 7/10 and increased ease of motion.  Pt reporting no pain in her left knee at end of session at rest. Pt's active assisted ROM was 98 degrees today.  Continue supervised PT to meet LTGs.  OBJECTIVE IMPAIRMENTS: Abnormal gait, decreased activity tolerance, decreased balance, decreased coordination, decreased endurance, decreased mobility, difficulty walking, decreased ROM, decreased strength, hypomobility, increased edema, increased fascial restrictions, impaired perceived functional ability, increased muscle spasms, impaired flexibility, improper body mechanics, and pain.   ACTIVITY LIMITATIONS: carrying, lifting, bending, sitting, standing, squatting, sleeping, stairs, transfers, bed mobility, and locomotion level  PARTICIPATION LIMITATIONS: meal prep, cleaning, laundry, interpersonal relationship, driving, shopping, community activity, and occupation  PERSONAL FACTORS: PMH positive for Lt TKA 2023, Lt breast CA, SBO, HTN.  are also affecting patient's functional  outcome.   REHAB POTENTIAL: Good  CLINICAL DECISION MAKING: Stable/uncomplicated  EVALUATION COMPLEXITY: Low   GOALS: Goals reviewed with patient? Yes  SHORT TERM GOALS: (target date for Short term goals are 3 weeks 04/13/2024)   1.  Patient will demonstrate independent use of home exercise program to maintain progress from in clinic treatments.  Goal status: Met 04/08/2024  LONG TERM GOALS: (target dates for all long term goals are 12 weeks  06/15/2024 )   1. Patient will demonstrate/report  pain at worst less than or equal to 2/10 to facilitate minimal limitation in daily activity secondary to pain symptoms.  Goal status: New   2. Patient will demonstrate independent use of home exercise program to facilitate ability to maintain/progress functional gains from skilled physical therapy services.  Goal status: New   3. Patient will demonstrate Patient specific functional scale avg > or = 8/10 to indicate reduced disability due to condition.   Goal status: New   4.  Patient will demonstrate Rt LE MMT 5/5 throughout to faciltiate usual transfers, stairs, squatting at [facility] for daily life.   Goal status: New   5.  Patient will demonstrate Rt knee AROM 0-110 deg to facilitate transfers, ambulation, stairs navigation at PLOF.  Goal status: New   6.  Patient will demonstrate ascending/descending stairs reciprocally s UE assist for community integration.   Goal status: New   7.  Patient will demonstrate independent ambulation community distances > 500 ft for community integration.  Goal Status: New   PLAN:  PT FREQUENCY: 1-2x/week  PT DURATION: 12 weeks  PLANNED INTERVENTIONS: Can include 97110-Therapeutic exercises, 97530- Therapeutic activity, 97112- Neuromuscular re-education, [phone]- Self Care, [id number]- Manual therapy, [phone]- Gait training, [phone]- Electrical stimulation (unattended), 97750 Physical performance testing, 97016- Vasopneumatic device  Patient/Family education,  Balance training, Stair training, Taping, Dry Needling, Joint mobilization, Joint manipulation, Spinal manipulation, Spinal mobilization, Scar mobilization, Vestibular training, Visual/preceptual remediation/compensation, DME instructions, Cryotherapy, and Moist heat.  All performed as medically necessary.  All included unless contraindicated  PLAN FOR NEXT SESSION:  AROM (extension emphasis), quadriceps strength, functional progressions as appropriate. Try stairs    Delon JONELLE Lunger, PT, MPT 04/12/24  3:09 PM    Referring diagnosis? M17.11 (ICD-10-CM) - Unilateral primary osteoarthritis, right knee Treatment diagnosis? (if different than referring diagnosis) M25.561, M25.661, M62.81, R26.2, R60.0 What was this (referring dx) caused by? [x]  Surgery []  Fall []  Ongoing issue []  Arthritis []  Other: ____________  Laterality: [x]  Rt []  Lt []  Both  Check all possible CPT codes:  *CHOOSE 10 OR LESS*    See Planned Interventions listed in the Plan section of the Evaluation.

## 2024-04-14 ENCOUNTER — Encounter: Payer: Self-pay | Admitting: Physical Therapy

## 2024-04-14 ENCOUNTER — Ambulatory Visit: Admitting: Physical Therapy

## 2024-04-14 DIAGNOSIS — R262 Difficulty in walking, not elsewhere classified: Secondary | ICD-10-CM

## 2024-04-14 DIAGNOSIS — M25561 Pain in right knee: Secondary | ICD-10-CM

## 2024-04-14 DIAGNOSIS — M6281 Muscle weakness (generalized): Secondary | ICD-10-CM

## 2024-04-14 DIAGNOSIS — M25661 Stiffness of right knee, not elsewhere classified: Secondary | ICD-10-CM | POA: Diagnosis not present

## 2024-04-14 DIAGNOSIS — R600 Localized edema: Secondary | ICD-10-CM

## 2024-04-14 DIAGNOSIS — G8929 Other chronic pain: Secondary | ICD-10-CM

## 2024-04-14 NOTE — Therapy (Signed)
 OUTPATIENT PHYSICAL THERAPY TREATMENT   Patient Name: Kelly Dixon MRN: [id number] DOB:[birth year], 78 y.o., female Today's Date: 04/14/2024  END OF SESSION:  PT End of Session - 04/14/24 1337     Visit Number 7    Number of Visits 24    Date for PT Re-Evaluation 06/15/24    Authorization Type [employer] $25 copay    Authorization Time Period 6/24-9/16    Authorization - Visit Number 7    Authorization - Number of Visits 12    Progress Note Due on Visit 10    PT Start Time 1345    PT Stop Time 1433    PT Time Calculation (min) 48 min    Activity Tolerance Patient tolerated treatment well;No increased pain    Behavior During Therapy WFL for tasks assessed/performed             Past Medical History:  Diagnosis Date   Allergy 1996   Anemia    Arthritis    Breast cancer (HCC) 2020   right breast IDC   Breast cancer (HCC) 10/2023   left breast ILC   Cataract May 2018   Gallstones 05/31/2018   Hypertension    Personal history of radiation therapy    PONV (postoperative nausea and vomiting)    Syphilis    Past Surgical History:  Procedure Laterality Date   BREAST BIOPSY Right 08/2019   BREAST BIOPSY Left 10/02/2023   US  LT BREAST BX W LOC DEV 1ST LESION IMG BX SPEC US  GUIDE 10/02/2023 [facility]   BREAST BIOPSY  10/17/2023   MM LT RADIOACTIVE SEED LOC MAMMO GUIDE 10/17/2023 [facility]   BREAST LUMPECTOMY Right 08/2019   BREAST LUMPECTOMY WITH RADIOACTIVE SEED AND SENTINEL LYMPH NODE BIOPSY Right 09/15/2019   Procedure: RIGHT BREAST LUMPECTOMY WITH RADIOACTIVE SEED AND SENTINEL LYMPH NODE BIOPSY;  Surgeon: Belinda Cough, MD;  Location: [facility];  Service: General;  Laterality: Right;   BREAST LUMPECTOMY WITH RADIOACTIVE SEED LOCALIZATION Left 10/20/2023   Procedure: LEFT BREAST LUMPECTOMY WITH RADIOACTIVE SEED LOCALIZATION;  Surgeon: Belinda Cough, MD;  Location: [facility];  Service: General;  Laterality: Left;   CHOLECYSTECTOMY N/A 04/13/2019    Procedure: LAPAROSCOPIC CHOLECYSTECTOMY WITH INTRAOPERATIVE CHOLANGIOGRAM;  Surgeon: Belinda Cough, MD;  Location[facility];  Service: General;  Laterality: N/A;   DILATION AND CURETTAGE OF UTERUS     JOINT REPLACEMENT  June 2023   RE-EXCISION OF BREAST LUMPECTOMY Left 11/04/2023   Procedure: RE-EXCISION OF INFERIOR MARGIN-LEFT BREAST LUMPECTOMY;  Surgeon: Belinda Cough, MD;  Location: [facility];  Service: General;  Laterality: Left;  LMA   TOTAL KNEE ARTHROPLASTY Left 03/26/2022   Procedure: LEFT TOTAL KNEE ARTHROPLASTY;  Surgeon: Vernetta Lonni GRADE, MD;  Location: [facility] OR;  Service: Orthopedics;  Laterality: Left;   TOTAL KNEE ARTHROPLASTY Right 03/09/2024   Procedure: ARTHROPLASTY, KNEE, TOTAL;  Surgeon: Vernetta Lonni GRADE, MD;  Location: [facility];  Service: Orthopedics;  Laterality: Right;   TUBAL LIGATION  1974   Patient Active Problem List   Diagnosis Date Noted   Status post total right knee replacement 03/09/2024   Genetic testing 02/05/2024   Malignant neoplasm of upper-outer quadrant of left female breast (HCC) 10/07/2023   SBO (small bowel obstruction) (HCC) 09/28/2022   Status post total left knee replacement 03/26/2022   Unilateral primary osteoarthritis, right knee 01/22/2022   Bilateral primary osteoarthritis of knee 01/09/2022   Chronic pain of both knees 10/09/2020   Essential hypertension 09/04/2020  Malignant neoplasm of upper-outer quadrant of right breast in female, estrogen receptor positive (HCC) 09/08/2019    PCP: Frann Mabel Mt DO  REFERRING PROVIDER: Vernetta Lonni GRADE, MD  REFERRING DIAG: M17.11 (ICD-10-CM) - Unilateral primary osteoarthritis, right knee  THERAPY DIAG:  Chronic pain of right knee  Stiffness of right knee, not elsewhere classified  Muscle weakness (generalized)  Difficulty in walking, not elsewhere classified  Localized edema  Rationale for Evaluation and Treatment: Rehabilitation  ONSET DATE: Rt  TKA 03/09/2024  SUBJECTIVE:   SUBJECTIVE STATEMENT:   Doing well, not c/o of too much pain today.   PERTINENT HISTORY: PMH positive for Lt TKA 2023, Lt breast CA, SBO, HTN.   PAIN:  NPRS scale: 0-4/10 this week Pain location: Rt knee Pain description: Ache, sore, stiff Aggravating factors: WB activity, random at night, inactivity, end range Relieving factors: medicine  PRECAUTIONS: None  WEIGHT BEARING RESTRICTIONS: No  FALLS:  Has patient fallen in last 6 months? No  LIVING ENVIRONMENT: Lives in: House/apartment Stairs: Flight of stairs to bedroom Has following equipment at home: FWW, [facility]  OCCUPATION: Substitute teaching  PLOF: Independent, swim, general exercise routine.   PATIENT GOALS: Reduce pain, back to exercise routine.   OBJECTIVE:   PATIENT SURVEYS:  Patient-Specific Activity Scoring Scheme  0 represents "unable to perform." 10 represents "able to perform at prior level. 0 1 2 3 4 5 6 7 8 9  10 (Date and Score)   Activity Eval  03/23/2024 04/08/2024   1. Walking  6  9  2. Driving  0  10  3. Standing 3 5  4. Stairs  8.5 9  Score 4.375 8.25   Total score = sum of the activity scores/number of activities Minimum detectable change (90%CI) for average score = 2 points Minimum detectable change (90%CI) for single activity score = 3 points  COGNITION: Overall cognitive status: WFL    SENSATION: 03/23/2024 Not tested  EDEMA:  03/23/2024: Localized edema Rt knee, lower leg noted.   MUSCLE LENGTH: 03/23/2024 No specific testing  POSTURE:  03/23/2024 Weight shift off Rt leg in standing.   PALPATION: 03/23/2024 Peri incision tenderness.   LOWER EXTREMITY ROM:   ROM Right Eval 03/23/2024 Left Eval 03/23/2024 Right 03/30/2024 Right 04/08/2024 Right 04/12/24 Right 04/14/24  Knee flexion 90 AROM in supine heel slide  96 AROM supine heel slide 100 Supine heel slides  AA: 98 A: 104 (after heel slides) P: 112  Knee extension -16 AROM in seated  LAQ  -12 in supine heel prop   -7 In supine heel prop -6 -8 In supine with towel roll under ankle    (Blank rows = not tested)  LOWER EXTREMITY MMT:  MMT Right Eval 03/23/2024 Left Eval 03/23/2024  Hip flexion 4/5 5/5  Hip extension    Hip abduction    Hip adduction    Hip internal rotation    Hip external rotation    Knee flexion 5/5 5/5  Knee extension 3+/5 5/5  Ankle dorsiflexion 5/5 5/5  Ankle plantarflexion    Ankle inversion    Ankle eversion     (Blank rows = not tested)  LOWER EXTREMITY SPECIAL TESTS:  03/23/2024 No specific testing today  FUNCTIONAL TESTS:  03/23/2024 18 inch chair transfer: able to perform on 1st try but Lt leg dominant.  Lt SLS: < 5 seconds Rt SLS: unable unassisted TUG : FWW 14.97 seconds  GAIT: 03/23/2024 Arrival with FWW, able to perform safe ambulation c SPC in Lt UE.  Lacking TKE in stance, knee flexion in swing.                                                                                                                                                                         TODAY'S TREATMENT                                                                          DATE:  04/14/24 TherEx Recumbent bike L2 x 8 min; seat 6 Seated Rt LAQ 3x10; 5# with 5 sec hold AA heel slides on ball x10 reps with strap ROM measurements - see above for details  TherAct Leg press bil 81# 3x10; RLE only 43# 3x10 RLE on 4 step with Lt heel taps 2x10; light UE supprot Sit to/from stand 2x10 without UE support  Modalities Vaso x 10 min mod pressure to Lt knee, 34 deg   04/12/24 There ex: Rec bike seat 6 minutes partial revolutions progressing to full Seated Rt leg LAQ x 15 for ROM Gastroc stretch x 60 seconds Supine heel slides with active flexion using strap TherAct: Leg press 81# double leg 2x10, 43# single leg 2x10 Step ups 6 inch step alternating Cone taps with R heel crossing midline  Side stepping with G tband 2 cycles TRX squats  2x10 Modalities:  Vasopneumatic: 34 deg x 10 min, medium compression      04/08/2024 Recumbent bike Seat 7 for 5 minutes focus on reaching into extension Prone quadriceps stretch 5 x 20 seconds Prone knee extension stretch 3 minutes with 1# Seated knee extension stretch with 4# for 3 minutes  Functional Activities: Double leg Press 75# 20 reps full extension and slow eccentrics Single leg Press 37# 15 reps full extension and slow eccentrics Step-down off 4 and 6 inch step 10 x each Step-up and over 6 and 8 inch step 10 x each Step-up and over hurdles without circumduction (needs work) 5 laps forward and back  Vaso Right Knee Medium 34* 10 minutes   PATIENT EDUCATION:  Education details: HEP, POC Person educated: Patient Education method: Programmer, multimedia, Demonstration, Verbal cues, and Handouts Education comprehension: verbalized understanding, returned demonstration, and verbal cues required  HOME EXERCISE PROGRAM: Access Code: [email or web address] URL: [email or web address] Date: 03/23/2024 Prepared by: Ozell Silvan  Exercises - Supine Heel Slide with Strap (Mirrored)  - 2-3 x daily - 7 x weekly - 1 sets - 10 reps - 5 hold - Supine Quadricep Sets  -  2-3 x daily - 7 x weekly - 1 sets - 10 reps - 5 hold - Supine Knee Extension Mobilization with Weight  - 2-3 x daily - 7 x weekly - 1 sets - 1 reps - to tolerance up to 5 mins hold - Seated Long Arc Quad  - 3-5 x daily - 7 x weekly - 1 sets - 5-10 reps - 2 hold - Seated Knee Flexion AAROM  - 2-3 x daily - 7 x weekly - 1 sets - 5 reps - 15 hold - Seated Quad Set  - 2-3 x daily - 7 x weekly - 1 sets - 10 reps - 5 hold - Seated Knee Extension Stretch with Chair  - 2-3 x daily - 7 x weekly - 1 sets - 1 reps - to tolerance.  hold  ASSESSMENT:  CLINICAL IMPRESSION: Good improvement in AROM today and overall progressing well with PT.  Will continue to benefit from PT to maximize function.    OBJECTIVE IMPAIRMENTS: Abnormal  gait, decreased activity tolerance, decreased balance, decreased coordination, decreased endurance, decreased mobility, difficulty walking, decreased ROM, decreased strength, hypomobility, increased edema, increased fascial restrictions, impaired perceived functional ability, increased muscle spasms, impaired flexibility, improper body mechanics, and pain.   ACTIVITY LIMITATIONS: carrying, lifting, bending, sitting, standing, squatting, sleeping, stairs, transfers, bed mobility, and locomotion level  PARTICIPATION LIMITATIONS: meal prep, cleaning, laundry, interpersonal relationship, driving, shopping, community activity, and occupation  PERSONAL FACTORS: PMH positive for Lt TKA 2023, Lt breast CA, SBO, HTN.  are also affecting patient's functional outcome.   REHAB POTENTIAL: Good  CLINICAL DECISION MAKING: Stable/uncomplicated  EVALUATION COMPLEXITY: Low   GOALS: Goals reviewed with patient? Yes  SHORT TERM GOALS: (target date for Short term goals are 3 weeks 04/13/2024)   1.  Patient will demonstrate independent use of home exercise program to maintain progress from in clinic treatments.  Goal status: Met 04/08/2024  LONG TERM GOALS: (target dates for all long term goals are 12 weeks  06/15/2024 )   1. Patient will demonstrate/report pain at worst less than or equal to 2/10 to facilitate minimal limitation in daily activity secondary to pain symptoms.  Goal status: New   2. Patient will demonstrate independent use of home exercise program to facilitate ability to maintain/progress functional gains from skilled physical therapy services.  Goal status: New   3. Patient will demonstrate Patient specific functional scale avg > or = 8/10 to indicate reduced disability due to condition.   Goal status: New   4.  Patient will demonstrate Rt LE MMT 5/5 throughout to faciltiate usual transfers, stairs, squatting at [facility] for daily life.   Goal status: New   5.  Patient will demonstrate  Rt knee AROM 0-110 deg to facilitate transfers, ambulation, stairs navigation at PLOF.  Goal status: New   6.  Patient will demonstrate ascending/descending stairs reciprocally s UE assist for community integration.   Goal status: New   7.  Patient will demonstrate independent ambulation community distances > 500 ft for community integration.  Goal Status: New   PLAN:  PT FREQUENCY: 1-2x/week  PT DURATION: 12 weeks  PLANNED INTERVENTIONS: Can include 97110-Therapeutic exercises, 97530- Therapeutic activity, 97112- Neuromuscular re-education, [phone]- Self Care, 02859- Manual therapy, [phone]- Gait training, [phone]- Electrical stimulation (unattended), 97750 Physical performance testing, 97016- Vasopneumatic device  Patient/Family education, Balance training, Stair training, Taping, Dry Needling, Joint mobilization, Joint manipulation, Spinal manipulation, Spinal mobilization, Scar mobilization, Vestibular training, Visual/preceptual remediation/compensation, DME instructions, Cryotherapy, and Moist heat.  All performed as medically necessary.  All included unless contraindicated  PLAN FOR NEXT SESSION:  continue strengthening, balance;  AROM (extension emphasis), quadriceps strength, functional progressions as appropriate. Try stairs    Corean JULIANNA Ku, PT, DPT 04/14/24 2:37 PM     Referring diagnosis? M17.11 (ICD-10-CM) - Unilateral primary osteoarthritis, right knee Treatment diagnosis? (if different than referring diagnosis) M25.561, M25.661, M62.81, R26.2, R60.0 What was this (referring dx) caused by? [x]  Surgery []  Fall []  Ongoing issue []  Arthritis []  Other: ____________  Laterality: [x]  Rt []  Lt []  Both  Check all possible CPT codes:  *CHOOSE 10 OR LESS*    See Planned Interventions listed in the Plan section of the Evaluation.

## 2024-04-15 ENCOUNTER — Telehealth: Payer: Self-pay | Admitting: Family Medicine

## 2024-04-15 ENCOUNTER — Encounter: Payer: Self-pay | Admitting: Family Medicine

## 2024-04-15 NOTE — Telephone Encounter (Signed)
 Copied from CRM [id number]. Topic: Medicare AWV >> Apr 15, 2024  1:26 PM Nathanel DEL wrote: Reason for CRM: Called LVM 04/15/2024 to schedule AWV. Please schedule Virtual or Telehealth visits ONLY.   Nathanel Paschal; Care Guide Ambulatory Clinical Support [facility] Direct Dial: [phone]

## 2024-04-19 ENCOUNTER — Encounter: Payer: Self-pay | Admitting: Physician Assistant

## 2024-04-19 ENCOUNTER — Encounter: Payer: Self-pay | Admitting: Physical Therapy

## 2024-04-19 ENCOUNTER — Ambulatory Visit: Admitting: Physical Therapy

## 2024-04-19 ENCOUNTER — Encounter: Admitting: Physical Therapy

## 2024-04-19 ENCOUNTER — Ambulatory Visit: Admitting: Physician Assistant

## 2024-04-19 DIAGNOSIS — R262 Difficulty in walking, not elsewhere classified: Secondary | ICD-10-CM

## 2024-04-19 DIAGNOSIS — R600 Localized edema: Secondary | ICD-10-CM | POA: Diagnosis not present

## 2024-04-19 DIAGNOSIS — M25661 Stiffness of right knee, not elsewhere classified: Secondary | ICD-10-CM | POA: Diagnosis not present

## 2024-04-19 DIAGNOSIS — M6281 Muscle weakness (generalized): Secondary | ICD-10-CM

## 2024-04-19 DIAGNOSIS — M25561 Pain in right knee: Secondary | ICD-10-CM | POA: Diagnosis not present

## 2024-04-19 DIAGNOSIS — Z96651 Presence of right artificial knee joint: Secondary | ICD-10-CM

## 2024-04-19 DIAGNOSIS — G8929 Other chronic pain: Secondary | ICD-10-CM

## 2024-04-19 NOTE — Therapy (Signed)
 OUTPATIENT PHYSICAL THERAPY TREATMENT DISCHARGE SUMMARY   Patient Name: Kelly Dixon MRN: [id number] DOB:[birth year], 78 y.o., female Today's Date: 04/19/2024  END OF SESSION:  PT End of Session - 04/19/24 0802     Visit Number 8    Number of Visits 24    Date for PT Re-Evaluation 06/15/24    Authorization Type [employer] $25 copay    Authorization Time Period 6/24-9/16    Authorization - Visit Number 8    Authorization - Number of Visits 12    Progress Note Due on Visit 10    PT Start Time 0758    PT Stop Time 0836    PT Time Calculation (min) 38 min    Activity Tolerance Patient tolerated treatment well;No increased pain    Behavior During Therapy WFL for tasks assessed/performed              Past Medical History:  Diagnosis Date   Allergy 1996   Anemia    Arthritis    Breast cancer (HCC) 2020   right breast IDC   Breast cancer (HCC) 10/2023   left breast ILC   Cataract May 2018   Gallstones 05/31/2018   Hypertension    Personal history of radiation therapy    PONV (postoperative nausea and vomiting)    Syphilis    Past Surgical History:  Procedure Laterality Date   BREAST BIOPSY Right 08/2019   BREAST BIOPSY Left 10/02/2023   US  LT BREAST BX W LOC DEV 1ST LESION IMG BX SPEC US  GUIDE 10/02/2023 [facility]   BREAST BIOPSY  10/17/2023   MM LT RADIOACTIVE SEED LOC MAMMO GUIDE 10/17/2023 [facility]   BREAST LUMPECTOMY Right 08/2019   BREAST LUMPECTOMY WITH RADIOACTIVE SEED AND SENTINEL LYMPH NODE BIOPSY Right 09/15/2019   Procedure: RIGHT BREAST LUMPECTOMY WITH RADIOACTIVE SEED AND SENTINEL LYMPH NODE BIOPSY;  Surgeon: Belinda Cough, MD;  Location: [facility];  Service: General;  Laterality: Right;   BREAST LUMPECTOMY WITH RADIOACTIVE SEED LOCALIZATION Left 10/20/2023   Procedure: LEFT BREAST LUMPECTOMY WITH RADIOACTIVE SEED LOCALIZATION;  Surgeon: Belinda Cough, MD;  Location: [facility] OR;  Service: General;  Laterality: Left;    CHOLECYSTECTOMY N/A 04/13/2019   Procedure: LAPAROSCOPIC CHOLECYSTECTOMY WITH INTRAOPERATIVE CHOLANGIOGRAM;  Surgeon: Belinda Cough, MD;  Location: [facility] OR;  Service: General;  Laterality: N/A;   DILATION AND CURETTAGE OF UTERUS     JOINT REPLACEMENT  June 2023   RE-EXCISION OF BREAST LUMPECTOMY Left 11/04/2023   Procedure: RE-EXCISION OF INFERIOR MARGIN-LEFT BREAST LUMPECTOMY;  Surgeon: Belinda Cough, MD;  Location: [facility];  Service: General;  Laterality: Left;  LMA   TOTAL KNEE ARTHROPLASTY Left 03/26/2022   Procedure: LEFT TOTAL KNEE ARTHROPLASTY;  Surgeon: Vernetta Lonni GRADE, MD;  Location: [facility] OR;  Service: Orthopedics;  Laterality: Left;   TOTAL KNEE ARTHROPLASTY Right 03/09/2024   Procedure: ARTHROPLASTY, KNEE, TOTAL;  Surgeon: Vernetta Lonni GRADE, MD;  Location: [facility];  Service: Orthopedics;  Laterality: Right;   TUBAL LIGATION  1974   Patient Active Problem List   Diagnosis Date Noted   Status post total right knee replacement 03/09/2024   Genetic testing 02/05/2024   Malignant neoplasm of upper-outer quadrant of left female breast (HCC) 10/07/2023   SBO (small bowel obstruction) (HCC) 09/28/2022   Status post total left knee replacement 03/26/2022   Unilateral primary osteoarthritis, right knee 01/22/2022   Bilateral primary osteoarthritis of knee 01/09/2022   Chronic pain of both knees 10/09/2020   Essential hypertension  09/04/2020   Malignant neoplasm of upper-outer quadrant of right breast in female, estrogen receptor positive (HCC) 09/08/2019    PCP: Frann Mabel Mt DO  REFERRING PROVIDER: Vernetta Lonni GRADE, MD  REFERRING DIAG: M17.11 (ICD-10-CM) - Unilateral primary osteoarthritis, right knee  THERAPY DIAG:  Chronic pain of right knee  Stiffness of right knee, not elsewhere classified  Muscle weakness (generalized)  Difficulty in walking, not elsewhere classified  Localized edema  Rationale for Evaluation and Treatment:  Rehabilitation  ONSET DATE: Rt TKA 03/09/2024  SUBJECTIVE:   SUBJECTIVE STATEMENT:   No complaints; feels like she's walking better  PERTINENT HISTORY: PMH positive for Lt TKA 2023, Lt breast CA, SBO, HTN.   PAIN:  NPRS scale: 0-5/10 this week Pain location: Rt knee Pain description: Ache, sore, stiff Aggravating factors: WB activity, random at night, inactivity, end range Relieving factors: medicine  PRECAUTIONS: None  WEIGHT BEARING RESTRICTIONS: No  FALLS:  Has patient fallen in last 6 months? No  LIVING ENVIRONMENT: Lives in: House/apartment Stairs: Flight of stairs to bedroom Has following equipment at home: FWW, [facility]  OCCUPATION: Substitute teaching  PLOF: Independent, swim, general exercise routine.   PATIENT GOALS: Reduce pain, back to exercise routine.   OBJECTIVE:   PATIENT SURVEYS:  Patient-Specific Activity Scoring Scheme  0 represents "unable to perform." 10 represents "able to perform at prior level. 0 1 2 3 4 5 6 7 8 9  10 (Date and Score)   Activity Eval  03/23/2024 04/08/2024  04/19/24  1. Walking  6  9 10   2. Driving  0  10 10  3. Standing 3 5 9   4. Stairs  8.5 9 10   Score 4.375 8.25 9.75   Total score = sum of the activity scores/number of activities Minimum detectable change (90%CI) for average score = 2 points Minimum detectable change (90%CI) for single activity score = 3 points  COGNITION: Overall cognitive status: WFL    SENSATION: 03/23/2024 Not tested  EDEMA:  03/23/2024: Localized edema Rt knee, lower leg noted.   MUSCLE LENGTH: 03/23/2024 No specific testing  POSTURE:  03/23/2024 Weight shift off Rt leg in standing.   PALPATION: 03/23/2024 Peri incision tenderness.   LOWER EXTREMITY ROM:   ROM Right Eval 03/23/2024 Left Eval 03/23/2024 Right 03/30/2024 Right 04/08/2024 Right 04/12/24 Right 04/14/24 Right 04/19/24  Knee flexion 90 AROM in supine heel slide  96 AROM supine heel slide 100 Supine heel slides  AA: 98  A: 104 (after heel slides) P: 112 A: 102 AA: 112  Knee extension -16 AROM in seated LAQ  -12 in supine heel prop   -7 In supine heel prop -6 -8 In supine with towel roll under ankle  -4 (seated LAQ)   (Blank rows = not tested)  LOWER EXTREMITY MMT:  MMT Right 03/23/2024 Left 03/23/2024 Right 04/19/24  Hip flexion 4/5 5/5 5/5  Knee flexion 5/5 5/5   Knee extension 3+/5 5/5 5/5  Ankle dorsiflexion 5/5 5/5    (Blank rows = not tested)  LOWER EXTREMITY SPECIAL TESTS:  03/23/2024 No specific testing today  FUNCTIONAL TESTS:  03/23/2024 18 inch chair transfer: able to perform on 1st try but Lt leg dominant.  Lt SLS: < 5 seconds Rt SLS: unable unassisted TUG : FWW 14.97 seconds  GAIT: 03/23/2024 Arrival with FWW, able to perform safe ambulation c SPC in Lt UE.  Lacking TKE in stance, knee flexion in swing.  TODAY'S TREATMENT                                                                          DATE:  04/19/24 TherEx Recumbent bike L3 x 8 min; seat 6 MMT and ROM measurements - see above for details Discussed current HEP and added some additional exercises that pt is familiar with, also recommended class or app based options to progress strengthening - pt verbalized understanding AA heel slides x 10 reps  TherAct Deadlifts x10 reps  with 12# KB - min cues for technique Negotiated stairs x 2 reciprocally initially with 1 UE support then able to progress to no UE support    04/14/24 TherEx Recumbent bike L2 x 8 min; seat 6 Seated Rt LAQ 3x10; 5# with 5 sec hold AA heel slides on ball x10 reps with strap ROM measurements - see above for details  TherAct Leg press bil 81# 3x10; RLE only 43# 3x10 RLE on 4 step with Lt heel taps 2x10; light UE supprot Sit to/from stand 2x10 without UE  support  Modalities Vaso x 10 min mod pressure to Lt knee, 34 deg   04/12/24 There ex: Rec bike seat 6 minutes partial revolutions progressing to full Seated Rt leg LAQ x 15 for ROM Gastroc stretch x 60 seconds Supine heel slides with active flexion using strap TherAct: Leg press 81# double leg 2x10, 43# single leg 2x10 Step ups 6 inch step alternating Cone taps with R heel crossing midline  Side stepping with G tband 2 cycles TRX squats 2x10  Modalities:  Vasopneumatic: 34 deg x 10 min, medium compression    PATIENT EDUCATION:  Education details: HEP, POC Person educated: Patient Education method: Programmer, multimedia, Demonstration, Verbal cues, and Handouts Education comprehension: verbalized understanding, returned demonstration, and verbal cues required  HOME EXERCISE PROGRAM: Access Code: [email or web address] URL: [email or web address] Date: 04/19/2024 Prepared by: Corean Ku  Exercises - Supine Heel Slide with Strap (Mirrored)  - 2-3 x daily - 7 x weekly - 1 sets - 10 reps - 5 hold - Supine Quadricep Sets  - 2-3 x daily - 7 x weekly - 1 sets - 10 reps - 5 hold - Supine Knee Extension Mobilization with Weight  - 2-3 x daily - 7 x weekly - 1 sets - 1 reps - to tolerance up to 5 mins hold - Supine Active Straight Leg Raise  - 1 x daily - 7 x weekly - 2 sets - 10 reps - Seated Long Arc Quad  - 3-5 x daily - 7 x weekly - 1 sets - 5-10 reps - 2 hold - Seated Quad Set  - 2-3 x daily - 7 x weekly - 1 sets - 10 reps - 5 hold - Seated Knee Extension Stretch with Chair  - 2-3 x daily - 7 x weekly - 1 sets - 1 reps - to tolerance.  hold - Standing Gastroc Stretch  - 1 x daily - 7 x weekly - 2 sets - 30 sec hold - Standing Soleus Stretch  - 1 x daily - 7 x weekly - 2 sets - 30 sec hold - Squat with Counter Support  - 1 x daily - 7 x weekly - 3 sets -  10 reps - Sit to Stand Without Arm Support  - 1 x daily - 7 x weekly - 3 sets - 10 reps - Half Deadlift with Kettlebell  - 1 x  daily - 7 x weekly - 3 sets - 10 reps  ASSESSMENT:  CLINICAL IMPRESSION: Pt has met/partially met all LTGs at this time and is please with her progress.  Will d/c PT today.    OBJECTIVE IMPAIRMENTS: Abnormal gait, decreased activity tolerance, decreased balance, decreased coordination, decreased endurance, decreased mobility, difficulty walking, decreased ROM, decreased strength, hypomobility, increased edema, increased fascial restrictions, impaired perceived functional ability, increased muscle spasms, impaired flexibility, improper body mechanics, and pain.   ACTIVITY LIMITATIONS: carrying, lifting, bending, sitting, standing, squatting, sleeping, stairs, transfers, bed mobility, and locomotion level  PARTICIPATION LIMITATIONS: meal prep, cleaning, laundry, interpersonal relationship, driving, shopping, community activity, and occupation  PERSONAL FACTORS: PMH positive for Lt TKA 2023, Lt breast CA, SBO, HTN.  are also affecting patient's functional outcome.   REHAB POTENTIAL: Good  CLINICAL DECISION MAKING: Stable/uncomplicated  EVALUATION COMPLEXITY: Low   GOALS: Goals reviewed with patient? Yes  SHORT TERM GOALS: (target date for Short term goals are 3 weeks 04/13/2024)   1.  Patient will demonstrate independent use of home exercise program to maintain progress from in clinic treatments.  Goal status: Met 04/08/2024  LONG TERM GOALS: (target dates for all long term goals are 12 weeks  06/15/2024 )   1. Patient will demonstrate/report pain at worst less than or equal to 2/10 to facilitate minimal limitation in daily activity secondary to pain symptoms.  Goal status: MET 04/19/24   2. Patient will demonstrate independent use of home exercise program to facilitate ability to maintain/progress functional gains from skilled physical therapy services.  Goal status: MET 04/19/24   3. Patient will demonstrate Patient specific functional scale avg > or = 8/10 to indicate reduced  disability due to condition.   Goal status: MET 04/19/24   4.  Patient will demonstrate Rt LE MMT 5/5 throughout to faciltiate usual transfers, stairs, squatting at [facility] for daily life.   Goal status: MET 04/19/24  5.  Patient will demonstrate Rt knee AROM 0-110 deg to facilitate transfers, ambulation, stairs navigation at PLOF.  Goal status: PARTIALLY MET 04/19/24   6.  Patient will demonstrate ascending/descending stairs reciprocally s UE assist for community integration.   Goal status: MET 04/19/24  7.  Patient will demonstrate independent ambulation community distances > 500 ft for community integration.  Goal Status: MET 04/19/24   PLAN:  PT FREQUENCY: 1-2x/week  PT DURATION: 12 weeks  PLANNED INTERVENTIONS: Can include 97110-Therapeutic exercises, 97530- Therapeutic activity, 97112- Neuromuscular re-education, 97535- Self Care, 02859- Manual therapy, [phone]- Gait training, [phone]- Electrical stimulation (unattended), 97750 Physical performance testing, 97016- Vasopneumatic device  Patient/Family education, Balance training, Stair training, Taping, Dry Needling, Joint mobilization, Joint manipulation, Spinal manipulation, Spinal mobilization, Scar mobilization, Vestibular training, Visual/preceptual remediation/compensation, DME instructions, Cryotherapy, and Moist heat.  All performed as medically necessary.  All included unless contraindicated  PLAN FOR NEXT SESSION:  D/C PT today  Corean JULIANNA Ku, PT, DPT 04/19/24 8:45 AM    PHYSICAL THERAPY DISCHARGE SUMMARY  Visits from Start of Care: 8  Current functional level related to goals / functional outcomes: See above   Remaining deficits: See above   Education / Equipment: HEP   Patient agrees to discharge. Patient goals were partially met. Patient is being discharged due to being pleased with the current functional level.  Corean  JULIANNA Ku, PT, DPT 04/19/24 8:45 AM  [facility] [address] [place], [place], [address] Phone: [phone]   Fax:  [phone]

## 2024-04-19 NOTE — Progress Notes (Signed)
 HPI: Ms. Watson returns today now 6 weeks status post left total knee arthroplasty.  She states she is doing great.  Denies any significant pain.  She states she just has some discomfort at night when sleeping and takes Tylenol .  She has finished physical therapy as of today.  She has a good home exercise program.  No complaints no concerns.  Physical exam: Bilateral knees: Good range of motion both knees.  Right knee well-healed surgical incision no signs of infection.  Full extension flexion to approximately 105 degrees.  No instability valgus varus stressing.  Ambulates without any assistive device.  Impression: Status post right total knee arthroplasty 03/09/2024  Plan: She will continue to work on range of motion and strengthening.  Will see her back at the 11-month mark at that time we will obtain AP and lateral views of the left knee.  Sooner if there is any questions concerns.  Continue to work on scar tissue mobilization.

## 2024-04-20 ENCOUNTER — Inpatient Hospital Stay: Attending: Hematology and Oncology | Admitting: Adult Health

## 2024-04-20 DIAGNOSIS — C50412 Malignant neoplasm of upper-outer quadrant of left female breast: Secondary | ICD-10-CM

## 2024-04-20 DIAGNOSIS — M85832 Other specified disorders of bone density and structure, left forearm: Secondary | ICD-10-CM | POA: Diagnosis not present

## 2024-04-20 DIAGNOSIS — Z170 Estrogen receptor positive status [ER+]: Secondary | ICD-10-CM

## 2024-04-20 NOTE — Progress Notes (Signed)
 SURVIVORSHIP VISIT:  I connected with Kelly Dixon on 04/20/24 at  9:20 AM EDT by telephone and verified that I am speaking with the correct person using two identifiers.  I discussed the limitations, risks, security and privacy concerns of performing an evaluation and management service by telephone and the availability of in person appointments.  I also discussed with the patient that there may be a patient responsible charge related to this service. The patient expressed understanding and agreed to proceed.  Patient location: home Provider location: [facility] office  BRIEF ONCOLOGIC HISTORY:  Oncology History  Malignant neoplasm of upper-outer quadrant of right breast in female, estrogen receptor positive (HCC)  09/08/2019 Initial Diagnosis   Routine screening mammogram detected a 0.8cm mass at the 10 o'clock position in the right breast, no right axillary adenopathy. Biopsy showed IDC with DCIS, grade 2, HER-2 - (1+), ER+ 100%, PR 70%, Ki67 15%.    09/08/2019 Cancer Staging   Staging form: Breast, AJCC 8th Edition - Clinical: Stage IA (cT1b, cN0, cM0, G2, ER+, PR+, HER2-)    09/15/2019 Surgery   Right lumpectomy ([place]) ([id number]): IDC with DCIS, 0.9cm, clear margins, 3 right axillary lymph nodes negative.    09/15/2019 Cancer Staging   Staging form: Breast, AJCC 8th Edition - Pathologic stage from 09/15/2019: Stage IA (pT1b, pN0, cM0, G2, ER+, PR+, HER2-)    10/2019 - 09/2024 Anti-estrogen oral therapy   Anastrozole  daily   01/18/2024 Genetic Testing   Negative [facility] CancerNext-Expanded +RNAinsight Panel.  Report date is 01/18/2024.   The CancerNext-Expanded gene panel offered by [facility] and includes sequencing, rearrangement, and RNA analysis for the following 76 genes: AIP, ALK, APC, ATM, AXIN2, BAP1, BARD1, BMPR1A, BRCA1, BRCA2, BRIP1, CDC73, CDH1, CDK4, CDKN1B, CDKN2A, CEBPA, CHEK2, CTNNA1, DDX41, DICER1, ETV6, FH, FLCN, GATA2, LZTR1, MAX, MBD4, MEN1, MET, MLH1, MSH2,  MSH3, MSH6, MUTYH, NF1, NF2, NTHL1, PALB2, PHOX2B, PMS2, POT1, PRKAR1A, PTCH1, PTEN, RAD51C, RAD51D, RB1, RET, RUNX1, SDHA, SDHAF2, SDHB, SDHC, SDHD, SMAD4, SMARCA4, SMARCB1, SMARCE1, STK11, SUFU, TMEM127, TP53, TSC1, TSC2, VHL, and WT1 (sequencing and deletion/duplication); EGFR, HOXB13, KIT, MITF, PDGFRA, POLD1, and POLE (sequencing only); EPCAM and GREM1 (deletion/duplication only).    Malignant neoplasm of upper-outer quadrant of left female breast (HCC)  10/07/2023 Initial Diagnosis   Malignant neoplasm of upper-outer quadrant of left female breast (HCC)   10/13/2023 Cancer Staging   Staging form: Breast, AJCC 8th Edition - Clinical stage from 10/13/2023: Stage IA (cT1b, cN0, cM0, G2, ER+, PR+, HER2-) - Signed by Izell Domino, MD on 10/13/2023 Stage prefix: Initial diagnosis Histologic grading system: 3 grade system   12/15/2023 - 01/05/2024 Radiation Therapy   Plan Name: Breast_L_BH Site: Breast, Left Technique: 3D Mode: Photon Dose Per Fraction: 2.67 Gy Prescribed Dose (Delivered / Prescribed): 24.03 Gy / 40.05 Gy Prescribed Fxs (Delivered / Prescribed): 9 / 15   Plan Name: Breast_L_BHL2 Site: Breast, Left Technique: 3D Mode: Photon Dose Per Fraction: 2.67 Gy Prescribed Dose (Delivered / Prescribed): 16.02 Gy / 16.02 Gy Prescribed Fxs (Delivered / Prescribed): 6 / 6   01/18/2024 Genetic Testing   Negative [facility] CancerNext-Expanded +RNAinsight Panel.  Report date is 01/18/2024.   The CancerNext-Expanded gene panel offered by [facility] and includes sequencing, rearrangement, and RNA analysis for the following 76 genes: AIP, ALK, APC, ATM, AXIN2, BAP1, BARD1, BMPR1A, BRCA1, BRCA2, BRIP1, CDC73, CDH1, CDK4, CDKN1B, CDKN2A, CEBPA, CHEK2, CTNNA1, DDX41, DICER1, ETV6, FH, FLCN, GATA2, LZTR1, MAX, MBD4, MEN1, MET, MLH1, MSH2, MSH3, MSH6, MUTYH, NF1, NF2, NTHL1, PALB2,  PHOX2B, PMS2, POT1, PRKAR1A, PTCH1, PTEN, RAD51C, RAD51D, RB1, RET, RUNX1, SDHA, SDHAF2, SDHB, SDHC, SDHD, SMAD4, SMARCA4,  SMARCB1, SMARCE1, STK11, SUFU, TMEM127, TP53, TSC1, TSC2, VHL, and WT1 (sequencing and deletion/duplication); EGFR, HOXB13, KIT, MITF, PDGFRA, POLD1, and POLE (sequencing only); EPCAM and GREM1 (deletion/duplication only).    12/2023 -  Anti-estrogen oral therapy   Anastrozole  x 7 years     INTERVAL HISTORY:  Kelly Dixon to review her survivorship care plan detailing her treatment course for breast cancer, as well as monitoring long-term side effects of that treatment, education regarding health maintenance, screening, and overall wellness and health promotion.     Overall, Kelly Dixon reports feeling quite well.  She is taking anastrozole  daily with good tolerance.     REVIEW OF SYSTEMS:  Review of Systems  Constitutional:  Negative for appetite change, chills, fatigue, fever and unexpected weight change.  HENT:   Negative for hearing loss, lump/mass and trouble swallowing.   Eyes:  Negative for eye problems and icterus.  Respiratory:  Negative for chest tightness, cough and shortness of breath.   Cardiovascular:  Negative for chest pain, leg swelling and palpitations.  Gastrointestinal:  Negative for abdominal distention, abdominal pain, constipation, diarrhea, nausea and vomiting.  Endocrine: Negative for hot flashes.  Genitourinary:  Negative for difficulty urinating.   Musculoskeletal:  Negative for arthralgias.  Skin:  Negative for itching and rash.  Neurological:  Negative for dizziness, extremity weakness, headaches and numbness.  Hematological:  Negative for adenopathy. Does not bruise/bleed easily.  Psychiatric/Behavioral:  Negative for depression. The patient is not nervous/anxious.   Breast: Denies any new nodularity, masses, tenderness, nipple changes, or nipple discharge.       PAST MEDICAL/SURGICAL HISTORY:  Past Medical History:  Diagnosis Date   Allergy 1996   Anemia    Arthritis    Breast cancer (HCC) 2020   right breast IDC   Breast cancer (HCC) 10/2023   left  breast ILC   Cataract May 2018   Gallstones 05/31/2018   Hypertension    Personal history of radiation therapy    PONV (postoperative nausea and vomiting)    Syphilis    Past Surgical History:  Procedure Laterality Date   BREAST BIOPSY Right 08/2019   BREAST BIOPSY Left 10/02/2023   US  LT BREAST BX W LOC DEV 1ST LESION IMG BX SPEC US  GUIDE 10/02/2023 [facility]   BREAST BIOPSY  10/17/2023   MM LT RADIOACTIVE SEED LOC MAMMO GUIDE 10/17/2023 [facility]   BREAST LUMPECTOMY Right 08/2019   BREAST LUMPECTOMY WITH RADIOACTIVE SEED AND SENTINEL LYMPH NODE BIOPSY Right 09/15/2019   Procedure: RIGHT BREAST LUMPECTOMY WITH RADIOACTIVE SEED AND SENTINEL LYMPH NODE BIOPSY;  Surgeon: Belinda Cough, MD;  Location: [facility];  Service: General;  Laterality: Right;   BREAST LUMPECTOMY WITH RADIOACTIVE SEED LOCALIZATION Left 10/20/2023   Procedure: LEFT BREAST LUMPECTOMY WITH RADIOACTIVE SEED LOCALIZATION;  Surgeon: Belinda Cough, MD;  Location: [facility] OR;  Service: General;  Laterality: Left;   CHOLECYSTECTOMY N/A 04/13/2019   Procedure: LAPAROSCOPIC CHOLECYSTECTOMY WITH INTRAOPERATIVE CHOLANGIOGRAM;  Surgeon: Belinda Cough, MD;  [facility];  Service: General;  Laterality: N/A;   DILATION AND CURETTAGE OF UTERUS     JOINT REPLACEMENT  June 2023   RE-EXCISION OF BREAST LUMPECTOMY Left 11/04/2023   Procedure: RE-EXCISION OF INFERIOR MARGIN-LEFT BREAST LUMPECTOMY;  Surgeon: Belinda Cough, MD;  Location: [facility];  Service: General;  Laterality: Left;  LMA   TOTAL KNEE ARTHROPLASTY Left 03/26/2022  Procedure: LEFT TOTAL KNEE ARTHROPLASTY;  Surgeon: Vernetta Lonni GRADE, MD;  Location: [facility] OR;  Service: Orthopedics;  Laterality: Left;   TOTAL KNEE ARTHROPLASTY Right 03/09/2024   Procedure: ARTHROPLASTY, KNEE, TOTAL;  Surgeon: Vernetta Lonni GRADE, MD;  Location: [facility] OR;  Service: Orthopedics;  Laterality: Right;   TUBAL LIGATION  1974     ALLERGIES:  No  Known Allergies   CURRENT MEDICATIONS:  Outpatient Encounter Medications as of 04/20/2024  Medication Sig   acetaminophen  (TYLENOL ) 500 MG tablet Take 1,000 mg by mouth every 6 (six) hours as needed for moderate pain (pain score 4-6).   amLODipine  (NORVASC ) 5 MG tablet TAKE 1 TABLET(5 MG) BY MOUTH DAILY   Doxylamine Succinate, Sleep, (SLEEP AID PO) Take 1 tablet by mouth at bedtime.   ferrous sulfate  325 (65 FE) MG EC tablet Take 1 tablet (325 mg total) by mouth daily with breakfast.   fexofenadine (ALLEGRA) 180 MG tablet Take 180 mg by mouth in the morning.   HYDROcodone -acetaminophen  (NORCO/VICODIN) 5-325 MG tablet Take 1 tablet by mouth every 6 (six) hours as needed for moderate pain (pain score 4-6).   letrozole  (FEMARA ) 2.5 MG tablet Take 1 tablet (2.5 mg total) by mouth daily.   Psyllium-Calcium  (FIBER PLUS CALCIUM  PO) Take 2 tablets by mouth in the morning. Chewables   No facility-administered encounter medications on file as of 04/20/2024.     ONCOLOGIC FAMILY HISTORY:  Family History  Problem Relation Age of Onset   Arthritis Mother    Hearing loss Mother    Heart disease Father    Arthritis Sister    Breast cancer Sister        dx 32s; paternal half sister   Colon cancer Neg Hx    Colon polyps Neg Hx    Esophageal cancer Neg Hx    Rectal cancer Neg Hx    Stomach cancer Neg Hx      SOCIAL HISTORY:  Social History   Socioeconomic History   Marital status: Divorced    Spouse name: Not on file   Number of children: 1   Years of education: Not on file   Highest education level: Bachelor's degree (e.g., BA, AB, BS)  Occupational History   Not on file  Tobacco Use   Smoking status: Former    Current packs/day: 0.00    Average packs/day: 0.5 packs/day for 54.2 years (27.1 ttl pk-yrs)    Types: Cigarettes    Start date: 10/04/1965    Quit date: 11/29/2019    Years since quitting: 4.3   Smokeless tobacco: Never  Vaping Use   Vaping status: Never Used  Substance and  Sexual Activity   Alcohol use: Not Currently   Drug use: Never   Sexual activity: Not Currently  Other Topics Concern   Not on file  Social History Narrative   Not on file   Social Drivers of Health   Financial Resource Strain: Medium Risk (03/16/2024)   Overall Financial Resource Strain (CARDIA)    Difficulty of Paying Living Expenses: Somewhat hard  Food Insecurity: No Food Insecurity (03/16/2024)   Hunger Vital Sign    Worried About Running Out of Food in the Last Year: Never true    Ran Out of Food in the Last Year: Never true  Transportation Needs: No Transportation Needs (03/16/2024)   PRAPARE - Administrator, Civil Service (Medical): No    Lack of Transportation (Non-Medical): No  Physical Activity: Insufficiently Active (03/16/2024)   Exercise Vital  Sign    Days of Exercise per Week: 7 days    Minutes of Exercise per Session: 20 min  Stress: No Stress Concern Present (03/16/2024)   [employer] of Occupational Health - Occupational Stress Questionnaire    Feeling of Stress: Not at all  Social Connections: Moderately Integrated (03/16/2024)   Social Connection and Isolation Panel    Frequency of Communication with Friends and Family: More than three times a week    Frequency of Social Gatherings with Friends and Family: Three times a week    Attends Religious Services: More than 4 times per year    Active Member of Clubs or Organizations: Yes    Attends Banker Meetings: More than 4 times per year    Marital Status: Divorced  Intimate Partner Violence: Not At Risk (03/10/2024)   Humiliation, Afraid, Rape, and Kick questionnaire    Fear of Current or Ex-Partner: No    Emotionally Abused: No    Physically Abused: No    Sexually Abused: No     OBSERVATIONS/OBJECTIVE:  Patient sounds well.  She is in no apparent distress.  Mood and behavior are normal.  Speech is normal.   LABORATORY DATA:  None for this visit.  DIAGNOSTIC IMAGING:  None  for this visit.      ASSESSMENT AND PLAN:  Kelly Dixon is a pleasant 78 y.o. female with Stage IA left breast invasive ductal carcinoma, ER+/PR+/HER2-, diagnosed in January 2025, treated with lumpectomy, adjuvant radiation therapy, and anti-estrogen therapy with anastrozole  beginning in April 2025.  She presents to the Survivorship Clinic for our initial meeting and routine follow-up post-completion of treatment for breast cancer.    1. Stage Ia left breast cancer:  Kelly Dixon is continuing to recover from definitive treatment for breast cancer. She will follow-up with her medical oncologist, Dr. Odean in 6 months with history and physical exam per surveillance protocol.  She will continue her anti-estrogen therapy with anastrozole . Thus far, she is tolerating the anastrozole  well, with minimal side effects. Her mammogram is due January 2026; orders placed today.  Today we discussed guardant reveal blood testing and she would like to proceed with this testing every 6 months to detect minimal residual disease.  Today, a comprehensive survivorship care plan and treatment summary was reviewed with the patient today detailing her breast cancer diagnosis, treatment course, potential late/long-term effects of treatment, appropriate follow-up care with recommendations for the future, and patient education resources.  A copy of this summary, along with a letter will be sent to the patient's primary care provider via mail/fax/In Basket message after today's visit.    2. Bone health:  Given Kelly Dixon age/history of breast cancer and her current treatment regimen including anti-estrogen therapy with anastrozole , she is at risk for bone demineralization.  Her last DEXA scan occurred in 2021 and showed mild osteopenia.  Repeat is recommended and I placed orders for this to occur in the next 6 months at the breast center since that is where she had her bone density testing completed previously.  She was given education  on specific activities to promote bone health.  3. Cancer screening:  Due to Kelly Dixon's history and her age, she should receive screening for skin cancers, colon cancer, and gynecologic cancers.  The information and recommendations are listed on the patient's comprehensive care plan/treatment summary and were reviewed in detail with the patient.    4. Health maintenance and wellness promotion: Kelly Dixon was encouraged to consume  5-7 servings of fruits and vegetables per day. We reviewed the Nutrition Rainbow handout.  She was also encouraged to engage in moderate to vigorous exercise for 30 minutes per day most days of the week.  She was instructed to limit her alcohol consumption and continue to abstain from tobacco use.     5. Support services/counseling: It is not uncommon for this period of the patient's cancer care trajectory to be one of many emotions and stressors.   She was given information regarding our available services and encouraged to contact me with any questions or for help enrolling in any of our support group/programs.    Follow up instructions:    -Return to cancer center in 6 months for follow-up with Dr. Odean Vickey reveal blood testing -Bone density testing ordered to be completed within the next 6 months -Mammogram due in January 2026 -She is welcome to return back to the Survivorship Clinic at any time; no additional follow-up needed at this time.  -Consider referral back to survivorship as a long-term survivor for continued surveillance  The patient was provided an opportunity to ask questions and all were answered. The patient agreed with the plan and demonstrated an understanding of the instructions.   The patient was advised to call back or seek an in-person evaluation if the symptoms worsen or if the condition fails to improve as anticipated.   I provided 25 minutes of non face-to-face telephone visit time during this encounter, and > 50% was spent  counseling as documented under my assessment & plan.   Morna Kendall, NP 04/20/24 9:54 AM Medical Oncology and Hematology [facility] [address] [place], [place] [address] Tel. [phone]    Fax. [phone]  *Total Encounter Time as defined by the Centers for Medicare and Medicaid Services includes, in addition to the face-to-face time of a patient visit (documented in the note above) non-face-to-face time: obtaining and reviewing outside history, ordering and reviewing medications, tests or procedures, care coordination (communications with other health care professionals or caregivers) and documentation in the medical record.

## 2024-04-21 ENCOUNTER — Encounter: Admitting: Physical Therapy

## 2024-04-26 ENCOUNTER — Encounter: Admitting: Physical Therapy

## 2024-04-28 ENCOUNTER — Encounter: Admitting: Physical Therapy

## 2024-04-30 ENCOUNTER — Ambulatory Visit (INDEPENDENT_AMBULATORY_CARE_PROVIDER_SITE_OTHER): Admitting: Family Medicine

## 2024-04-30 ENCOUNTER — Ambulatory Visit (INDEPENDENT_AMBULATORY_CARE_PROVIDER_SITE_OTHER)

## 2024-04-30 ENCOUNTER — Encounter: Payer: Self-pay | Admitting: Family Medicine

## 2024-04-30 VITALS — BP 128/68 | HR 93 | Temp 98.0°F | Resp 16 | Ht 62.0 in | Wt 194.6 lb

## 2024-04-30 DIAGNOSIS — M25511 Pain in right shoulder: Secondary | ICD-10-CM | POA: Diagnosis not present

## 2024-04-30 DIAGNOSIS — G8929 Other chronic pain: Secondary | ICD-10-CM

## 2024-04-30 DIAGNOSIS — M19011 Primary osteoarthritis, right shoulder: Secondary | ICD-10-CM | POA: Diagnosis not present

## 2024-04-30 NOTE — Patient Instructions (Signed)
 Heat (pad or rice pillow in microwave) over affected area, 10-15 minutes twice daily.   Ice/cold pack over area for 10-15 min twice daily.  OK to take Tylenol  1000 mg (2 extra strength tabs) or 975 mg (3 regular strength tabs) every 6 hours as needed.  Please get your X-ray done at the [facility] in [place]: [address] [place], [place] [address] [phone]  You do not need an appointment for this location.   Let us  know if you need anything.  EXERCISES  RANGE OF MOTION (ROM) AND STRETCHING EXERCISES These exercises may help you when beginning to rehabilitate your injury. While completing these exercises, remember:  Restoring tissue flexibility helps normal motion to return to the joints. This allows healthier, less painful movement and activity. An effective stretch should be held for at least 30 seconds. A stretch should never be painful. You should only feel a gentle lengthening or release in the stretched tissue.  ROM - Pendulum Bend at the waist so that your right / left arm falls away from your body. Support yourself with your opposite hand on a solid surface, such as a table or a countertop. Your right / left arm should be perpendicular to the ground. If it is not perpendicular, you need to lean over farther. Relax the muscles in your right / left arm and shoulder as much as possible. Gently sway your hips and trunk so they move your right / left arm without any use of your right / left shoulder muscles. Progress your movements so that your right / left arm moves side to side, then forward and backward, and finally, both clockwise and counterclockwise. Complete 10-15 repetitions in each direction. Many people use this exercise to relieve discomfort in their shoulder as well as to gain range of motion. Repeat 2 times. Complete this exercise 3 times per week.  STRETCH - Flexion, Standing Stand with good posture. With an underhand grip on your right / left hand and an  overhand grip on the opposite hand, grasp a broomstick or cane so that your hands are a little more than shoulder-width apart. Keeping your right / left elbow straight and shoulder muscles relaxed, push the stick with your opposite hand to raise your right / left arm in front of your body and then overhead. Raise your arm until you feel a stretch in your right / left shoulder, but before you have increased shoulder pain. Try to avoid shrugging your right / left shoulder as your arm rises by keeping your shoulder blade tucked down and toward your mid-back spine. Hold 30 seconds. Slowly return to the starting position. Repeat 2 times. Complete this exercise 3 times per week.  STRETCH - Internal Rotation Place your right / left hand behind your back, palm-up. Throw a towel or belt over your opposite shoulder. Grasp the towel/belt with your right / left hand. While keeping an upright posture, gently pull up on the towel/belt until you feel a stretch in the front of your right / left shoulder. Avoid shrugging your right / left shoulder as your arm rises by keeping your shoulder blade tucked down and toward your mid-back spine. Hold 30. Release the stretch by lowering your opposite hand. Repeat 2 times. Complete this exercise 3 times per week.  STRETCH - External Rotation and Abduction Stagger your stance through a doorframe. It does not matter which foot is forward. As instructed by your physician, physical therapist or athletic trainer, place your hands: And forearms  above your head and on the door frame. And forearms at head-height and on the door frame. At elbow-height and on the door frame. Keeping your head and chest upright and your stomach muscles tight to prevent over-extending your low-back, slowly shift your weight onto your front foot until you feel a stretch across your chest and/or in the front of your shoulders. Hold 30 seconds. Shift your weight to your back foot to release the  stretch. Repeat 2 times. Complete this stretch 3 times per week.   STRENGTHENING EXERCISES  These exercises may help you when beginning to rehabilitate your injury. They may resolve your symptoms with or without further involvement from your physician, physical therapist or athletic trainer. While completing these exercises, remember:  Muscles can gain both the endurance and the strength needed for everyday activities through controlled exercises. Complete these exercises as instructed by your physician, physical therapist or athletic trainer. Progress the resistance and repetitions only as guided. You may experience muscle soreness or fatigue, but the pain or discomfort you are trying to eliminate should never worsen during these exercises. If this pain does worsen, stop and make certain you are following the directions exactly. If the pain is still present after adjustments, discontinue the exercise until you can discuss the trouble with your clinician. If advised by your physician, during your recovery, avoid activity or exercises which involve actions that place your right / left hand or elbow above your head or behind your back or head. These positions stress the tissues which are trying to heal.  STRENGTH - Scapular Depression and Adduction With good posture, sit on a firm chair. Supported your arms in front of you with pillows, arm rests or a table top. Have your elbows in line with the sides of your body. Gently draw your shoulder blades down and toward your mid-back spine. Gradually increase the tension without tensing the muscles along the top of your shoulders and the back of your neck. Hold for 3 seconds. Slowly release the tension and relax your muscles completely before completing the next repetition. After you have practiced this exercise, remove the arm support and complete it in standing as well as sitting. Repeat 2 times. Complete this exercise 3 times per week.   STRENGTH -  External Rotators Secure a rubber exercise band/tubing to a fixed object so that it is at the same height as your right / left elbow when you are standing or sitting on a firm surface. Stand or sit so that the secured exercise band/tubing is at your side that is not injured. Bend your elbow 90 degrees. Place a folded towel or small pillow under your right / left arm so that your elbow is a few inches away from your side. Keeping the tension on the exercise band/tubing, pull it away from your body, as if pivoting on your elbow. Be sure to keep your body steady so that the movement is only coming from your shoulder rotating. Hold 3 seconds. Release the tension in a controlled manner as you return to the starting position. Repeat 2 times. Complete this exercise 3 times per week.   STRENGTH - Supraspinatus Stand or sit with good posture. Grasp a 2-3 lb weight or an exercise band/tubing so that your hand is thumbs-up, like when you shake hands. Slowly lift your right / left hand from your thigh into the air, traveling about 30 degrees from straight out at your side. Lift your hand to shoulder height or as far as  you can without increasing any shoulder pain. Initially, many people do not lift their hands above shoulder height. Avoid shrugging your right / left shoulder as your arm rises by keeping your shoulder blade tucked down and toward your mid-back spine. Hold for 3 seconds. Control the descent of your hand as you slowly return to your starting position. Repeat 2 times. Complete this exercise 3 times per week.   STRENGTH - Shoulder Extensors Secure a rubber exercise band/tubing so that it is at the height of your shoulders when you are either standing or sitting on a firm arm-less chair. With a thumbs-up grip, grasp an end of the band/tubing in each hand. Straighten your elbows and lift your hands straight in front of you at shoulder height. Step back away from the secured end of band/tubing until  it becomes tense. Squeezing your shoulder blades together, pull your hands down to the sides of your thighs. Do not allow your hands to go behind you. Hold for 3 seconds. Slowly ease the tension on the band/tubing as you reverse the directions and return to the starting position. Repeat 2 times. Complete this exercise 3 times per week.   STRENGTH - Scapular Retractors Secure a rubber exercise band/tubing so that it is at the height of your shoulders when you are either standing or sitting on a firm arm-less chair. With a palm-down grip, grasp an end of the band/tubing in each hand. Straighten your elbows and lift your hands straight in front of you at shoulder height. Step back away from the secured end of band/tubing until it becomes tense. Squeezing your shoulder blades together, draw your elbows back as you bend them. Keep your upper arm lifted away from your body throughout the exercise. Hold 3 seconds. Slowly ease the tension on the band/tubing as you reverse the directions and return to the starting position. Repeat 2 times. Complete this exercise 3 times per week.  STRENGTH - Scapular Depressors Find a sturdy chair without wheels, such as a from a dining room table. Keeping your feet on the floor, lift your bottom from the seat and lock your elbows. Keeping your elbows straight, allow gravity to pull your body weight down. Your shoulders will rise toward your ears. Raise your body against gravity by drawing your shoulder blades down your back, shortening the distance between your shoulders and ears. Although your feet should always maintain contact with the floor, your feet should progressively support less body weight as you get stronger. Hold 3 seconds. In a controlled and slow manner, lower your body weight to begin the next repetition. Repeat 2 times. Complete this exercise 3 times per week.    This information is not intended to replace advice given to you by your health care  provider. Make sure you discuss any questions you have with your health care provider.   Document Released: 07/31/2005 Document Revised: 10/07/2014 Document Reviewed: 12/29/2008 Elsevier Interactive Patient Education [employer]

## 2024-04-30 NOTE — Progress Notes (Signed)
 Musculoskeletal Exam  Patient: Kelly Dixon DOB: [birth year]  DOS: 04/30/2024  SUBJECTIVE:  Chief Complaint:   Chief Complaint  Patient presents with   Shoulder Injury    Shoulder Pain    Kelly Dixon is a 78 y.o.  female for evaluation and treatment of R shoulder pain.   Onset:  2 months ago. Was doing stretches for knees and pulling on a towel Location: back of shoulder Character:  aching  Progression of issue:  has worsened since swimming Associated symptoms: radiates into her upper shoulder Nml ROM, no bruising, redness, swelling Treatment: to date has been rest, ice, and acetaminophen .   Neurovascular symptoms: no  Past Medical History:  Diagnosis Date   Allergy 1996   Anemia    Arthritis    Breast cancer (HCC) 2020   right breast IDC   Breast cancer (HCC) 10/2023   left breast ILC   Cataract May 2018   Gallstones 05/31/2018   Hypertension    Personal history of radiation therapy    PONV (postoperative nausea and vomiting)    Syphilis     Objective: VITAL SIGNS: BP 128/68 (BP Location: Left Arm, Patient Position: Sitting)   Pulse 93   Temp 98 F (36.7 C) (Oral)   Resp 16   Ht 5' 2 (1.575 m)   Wt 194 lb 9.6 oz (88.3 kg)   SpO2 95%   BMI 35.59 kg/m  Constitutional: Well formed, well developed. No acute distress. Thorax & Lungs: No accessory muscle use Musculoskeletal: R shoulder.   Normal active range of motion: yes.   Normal passive range of motion: yes Tenderness to palpation: no Deformity: no Ecchymosis: no Tests positive: Hawkins, empty can Tests negative: Neer's, crossover, speeds, liftoff, O'Brien's Neurologic: Normal sensory function.  Psychiatric: Normal mood. Age appropriate judgment and insight. Alert & oriented x 3.    Assessment:  Right shoulder pain, unspecified chronicity - Plan: DG Shoulder Right  Plan: Will check x-ray to rule out impingement.  Stretches/exercises, heat, ice, Tylenol .   F/u as originally scheduled. The  patient voiced understanding and agreement to the plan.   Mabel [place], DO 04/30/24  8:59 AM

## 2024-05-03 ENCOUNTER — Ambulatory Visit

## 2024-05-03 ENCOUNTER — Encounter: Admitting: Physical Therapy

## 2024-05-05 ENCOUNTER — Ambulatory Visit (INDEPENDENT_AMBULATORY_CARE_PROVIDER_SITE_OTHER): Admitting: Family Medicine

## 2024-05-05 ENCOUNTER — Encounter: Admitting: Rehabilitative and Restorative Service Providers"

## 2024-05-05 ENCOUNTER — Encounter: Payer: Self-pay | Admitting: Family Medicine

## 2024-05-05 VITALS — BP 130/72 | HR 86 | Temp 98.0°F | Resp 16 | Ht 62.0 in | Wt 194.0 lb

## 2024-05-05 DIAGNOSIS — I10 Essential (primary) hypertension: Secondary | ICD-10-CM

## 2024-05-05 DIAGNOSIS — Z0000 Encounter for general adult medical examination without abnormal findings: Secondary | ICD-10-CM | POA: Diagnosis not present

## 2024-05-05 LAB — LIPID PANEL
Cholesterol: 172 mg/dL (ref 0–200)
HDL: 52.1 mg/dL (ref >39.00)
LDL Cholesterol: 84 mg/dL (ref 0–99)
NonHDL: 120.18
Total CHOL/HDL Ratio: 3
Triglycerides: 183 mg/dL — ABNORMAL HIGH (ref 0.0–149.0)
VLDL: 36.6 mg/dL (ref 0.0–40.0)

## 2024-05-05 LAB — COMPREHENSIVE METABOLIC PANEL WITH GFR
ALT: 10 U/L (ref 0–35)
AST: 13 U/L (ref 0–37)
Albumin: 4.5 g/dL (ref 3.5–5.2)
Alkaline Phosphatase: 56 U/L (ref 39–117)
BUN: 17 mg/dL (ref 6–23)
CO2: 29 meq/L (ref 19–32)
Calcium: 9.4 mg/dL (ref 8.4–10.5)
Chloride: 104 meq/L (ref 96–112)
Creatinine, Ser: 0.58 mg/dL (ref 0.40–1.20)
GFR: 88.31 mL/min (ref >60.00)
Glucose, Bld: 100 mg/dL — ABNORMAL HIGH (ref 70–99)
Potassium: 4 meq/L (ref 3.5–5.1)
Sodium: 141 meq/L (ref 135–145)
Total Bilirubin: 0.4 mg/dL (ref 0.2–1.2)
Total Protein: 6.9 g/dL (ref 6.0–8.3)

## 2024-05-05 LAB — CBC
HCT: 39.6 % (ref 36.0–46.0)
Hemoglobin: 13 g/dL (ref 12.0–15.0)
MCHC: 32.8 g/dL (ref 30.0–36.0)
MCV: 95.4 fl (ref 78.0–100.0)
Platelets: 234 K/uL (ref 150.0–400.0)
RBC: 4.15 Mil/uL (ref 3.87–5.11)
RDW: 13.1 % (ref 11.5–15.5)
WBC: 3.6 K/uL — ABNORMAL LOW (ref 4.0–10.5)

## 2024-05-05 NOTE — Progress Notes (Signed)
 Chief Complaint  Patient presents with   Annual Exam    CPE     Well Woman Kelly Dixon is here for a complete physical.   Her last physical was >1 year ago.  Current diet: in general, a pretty good diet. Current exercise: strength training, stretching, some swimming when her shoulder is OK. Weight is intentionally decreasing and she denies daytime fatigue. Seatbelt? Yes Advanced directive? No  Health Maintenance CCS- Yes Shingrix- Yes Lung cancer screening- Yes DEXA- Yes Mammogram- Yes Tetanus- Yes Pneumonia- Yes Hep C screen- Yes  Past Medical History:  Diagnosis Date   Allergy 1996   Anemia    Arthritis    Breast cancer (HCC) 2020   right breast IDC   Breast cancer (HCC) 10/2023   left breast ILC   Cataract May 2018   Gallstones 05/31/2018   Hypertension    Personal history of radiation therapy    PONV (postoperative nausea and vomiting)    Syphilis      Past Surgical History:  Procedure Laterality Date   BREAST BIOPSY Right 08/2019   BREAST BIOPSY Left 10/02/2023   US  LT BREAST BX W LOC DEV 1ST LESION IMG BX SPEC US  GUIDE 10/02/2023 [facility]   BREAST BIOPSY  10/17/2023   MM LT RADIOACTIVE SEED LOC MAMMO GUIDE 10/17/2023 [facility]   BREAST LUMPECTOMY Right 08/2019   BREAST LUMPECTOMY WITH RADIOACTIVE SEED AND SENTINEL LYMPH NODE BIOPSY Right 09/15/2019   Procedure: RIGHT BREAST LUMPECTOMY WITH RADIOACTIVE SEED AND SENTINEL LYMPH NODE BIOPSY;  Surgeon: Belinda Cough, MD;  Location: [facility];  Service: General;  Laterality: Right;   BREAST LUMPECTOMY WITH RADIOACTIVE SEED LOCALIZATION Left 10/20/2023   Procedure: LEFT BREAST LUMPECTOMY WITH RADIOACTIVE SEED LOCALIZATION;  Surgeon: Belinda Cough, MD;  Location: [facility] OR;  Service: General;  Laterality: Left;   CHOLECYSTECTOMY N/A 04/13/2019   Procedure: LAPAROSCOPIC CHOLECYSTECTOMY WITH INTRAOPERATIVE CHOLANGIOGRAM;  Surgeon: Belinda Cough, MD;  Location: [facility];  Service: General;   Laterality: N/A;   DILATION AND CURETTAGE OF UTERUS     JOINT REPLACEMENT  June 2023   RE-EXCISION OF BREAST LUMPECTOMY Left 11/04/2023   Procedure: RE-EXCISION OF INFERIOR MARGIN-LEFT BREAST LUMPECTOMY;  Surgeon: Belinda Cough, MD;  Location: [facility];  Service: General;  Laterality: Left;  LMA   TOTAL KNEE ARTHROPLASTY Left 03/26/2022   Procedure: LEFT TOTAL KNEE ARTHROPLASTY;  Surgeon: Vernetta Lonni GRADE, MD;  Location: [facility] OR;  Service: Orthopedics;  Laterality: Left;   TOTAL KNEE ARTHROPLASTY Right 03/09/2024   Procedure: ARTHROPLASTY, KNEE, TOTAL;  Surgeon: Vernetta Lonni GRADE, MD;  Location: [facility];  Service: Orthopedics;  Laterality: Right;   TUBAL LIGATION  1974    Medications  Current Outpatient Medications on File Prior to Visit  Medication Sig Dispense Refill   acetaminophen  (TYLENOL ) 500 MG tablet Take 1,000 mg by mouth every 6 (six) hours as needed for moderate pain (pain score 4-6).     amLODipine  (NORVASC ) 5 MG tablet TAKE 1 TABLET(5 MG) BY MOUTH DAILY 90 tablet 1   Doxylamine Succinate, Sleep, (SLEEP AID PO) Take 1 tablet by mouth at bedtime.     ferrous sulfate  325 (65 FE) MG EC tablet Take 1 tablet (325 mg total) by mouth daily with breakfast. 30 tablet 3   fexofenadine (ALLEGRA) 180 MG tablet Take 180 mg by mouth in the morning.     HYDROcodone -acetaminophen  (NORCO/VICODIN) 5-325 MG tablet Take 1 tablet by mouth every 6 (six) hours as needed for  moderate pain (pain score 4-6). 30 tablet 0   letrozole  (FEMARA ) 2.5 MG tablet Take 1 tablet (2.5 mg total) by mouth daily. 90 tablet 3   Psyllium-Calcium  (FIBER PLUS CALCIUM  PO) Take 2 tablets by mouth in the morning. Chewables      Allergies No Known Allergies  Review of Systems: Constitutional:  no fevers Eye:  no recent significant change in vision Ears:  No changes in hearing Nose/Mouth/Throat:  no complaints of nasal congestion, no sore throat Cardiovascular: no chest pain Respiratory:  No  shortness of breath Gastrointestinal:  No change in bowel habits GU:  Female: negative for dysuria Integumentary:  no abnormal skin lesions reported Neurologic:  no headaches Endocrine:  denies unexplained weight changes  Exam BP 130/72 (BP Location: Left Arm, Patient Position: Sitting)   Pulse 86   Temp 98 F (36.7 C) (Oral)   Resp 16   Ht 5' 2 (1.575 m)   Wt 194 lb (88 kg)   SpO2 98%   BMI 35.48 kg/m  General:  well developed, well nourished, in no apparent distress Skin:  no significant moles, warts, or growths Head:  no masses, lesions, or tenderness Eyes:  pupils equal and round, sclera anicteric without injection Ears:  canals without lesions, TMs shiny without retraction, no obvious effusion, no erythema Nose:  nares patent, mucosa normal, and no drainage Throat/Pharynx:  lips and gingiva without lesion; tongue and uvula midline; non-inflamed pharynx; no exudates or postnasal drainage, edentulous Neck: neck supple without adenopathy, thyromegaly, or masses Lungs:  clear to auscultation, breath sounds equal bilaterally, no respiratory distress Cardio:  regular rate and rhythm, no bruits or LE edema Abdomen:  abdomen soft, nontender; bowel sounds normal; no masses or organomegaly Genital: Deferred Neuro:  gait normal; deep tendon reflexes normal and symmetric Psych: well oriented with normal range of affect and appropriate judgment/insight  Assessment and Plan  Well adult exam  Essential hypertension - Plan: CBC, Comprehensive metabolic panel with GFR, Lipid panel   Well 78 y.o. female. Counseled on diet and exercise. Advanced directive form provided today.  Other orders as above. Follow up in 6 mo. The patient voiced understanding and agreement to the plan.  Mabel [place], DO 05/05/24 11:04 AM

## 2024-05-05 NOTE — Patient Instructions (Signed)
Give [place] 2-3 business days to get the results of your labs back.   Keep the diet clean and stay active.  Please get me a copy of your advanced directive form at your convenience.   I recommend getting the flu shot in mid October. This suggestion would change if the CDC comes out with a different recommendation.   Let us know if you need anything.

## 2024-05-06 ENCOUNTER — Ambulatory Visit: Payer: Self-pay | Admitting: Family Medicine

## 2024-05-07 ENCOUNTER — Other Ambulatory Visit: Payer: Self-pay

## 2024-05-07 DIAGNOSIS — E781 Pure hyperglyceridemia: Secondary | ICD-10-CM

## 2024-05-09 ENCOUNTER — Ambulatory Visit: Payer: Self-pay | Admitting: Family Medicine

## 2024-05-10 ENCOUNTER — Encounter: Admitting: Physical Therapy

## 2024-05-10 ENCOUNTER — Ambulatory Visit: Admitting: Orthopaedic Surgery

## 2024-05-12 ENCOUNTER — Ambulatory Visit (HOSPITAL_BASED_OUTPATIENT_CLINIC_OR_DEPARTMENT_OTHER)
Admission: RE | Admit: 2024-05-12 | Discharge: 2024-05-12 | Disposition: A | Discharge status: Home / Self Care | Source: Ambulatory Visit | Attending: Acute Care | Admitting: Acute Care

## 2024-05-12 ENCOUNTER — Encounter: Admitting: Physical Therapy

## 2024-05-12 DIAGNOSIS — Z87891 Personal history of nicotine dependence: Secondary | ICD-10-CM | POA: Diagnosis not present

## 2024-05-12 DIAGNOSIS — Z122 Encounter for screening for malignant neoplasm of respiratory organs: Secondary | ICD-10-CM | POA: Insufficient documentation

## 2024-05-12 DIAGNOSIS — C50411 Malignant neoplasm of upper-outer quadrant of right female breast: Secondary | ICD-10-CM | POA: Diagnosis not present

## 2024-05-12 DIAGNOSIS — C50412 Malignant neoplasm of upper-outer quadrant of left female breast: Secondary | ICD-10-CM | POA: Diagnosis not present

## 2024-05-14 ENCOUNTER — Encounter: Payer: Self-pay | Admitting: Adult Health

## 2024-05-17 ENCOUNTER — Encounter: Admitting: Physical Therapy

## 2024-05-19 ENCOUNTER — Encounter: Admitting: Physical Therapy

## 2024-05-28 ENCOUNTER — Other Ambulatory Visit: Payer: Self-pay | Admitting: Acute Care

## 2024-05-28 DIAGNOSIS — Z87891 Personal history of nicotine dependence: Secondary | ICD-10-CM

## 2024-05-28 DIAGNOSIS — Z122 Encounter for screening for malignant neoplasm of respiratory organs: Secondary | ICD-10-CM

## 2024-06-01 ENCOUNTER — Other Ambulatory Visit: Payer: Self-pay | Admitting: Adult Health

## 2024-06-01 DIAGNOSIS — M85832 Other specified disorders of bone density and structure, left forearm: Secondary | ICD-10-CM

## 2024-06-08 ENCOUNTER — Other Ambulatory Visit

## 2024-06-24 ENCOUNTER — Encounter: Payer: Self-pay | Admitting: Family Medicine

## 2024-06-24 DIAGNOSIS — I10 Essential (primary) hypertension: Secondary | ICD-10-CM

## 2024-06-25 MED ORDER — AMLODIPINE BESYLATE 5 MG PO TABS
ORAL_TABLET | ORAL | 1 refills | Status: AC
Start: 2024-06-25 — End: ?

## 2024-08-02 ENCOUNTER — Encounter: Payer: Self-pay | Admitting: Radiology

## 2024-08-25 ENCOUNTER — Encounter: Payer: Self-pay | Admitting: Physician Assistant

## 2024-08-25 ENCOUNTER — Ambulatory Visit: Admitting: Physician Assistant

## 2024-08-25 ENCOUNTER — Ambulatory Visit

## 2024-08-25 ENCOUNTER — Other Ambulatory Visit (INDEPENDENT_AMBULATORY_CARE_PROVIDER_SITE_OTHER)

## 2024-08-25 DIAGNOSIS — Z96651 Presence of right artificial knee joint: Secondary | ICD-10-CM | POA: Diagnosis not present

## 2024-08-25 NOTE — Progress Notes (Signed)
 HPI: Kelly Dixon returns today follow-up status post right total knee arthroplasty 03/09/2024.  She states that she is having no pain in the knee whatsoever.  She feels she is doing great.  Has no concerns.  She is ambulating without any assistive device.  Physical exam: General well-developed well-nourished female in no acute distress.  Gets up and down the on her own.  Walks with a nonantalgic gait no assistive device. Right knee: Full extension flexion to approximately 110 degrees.  No instability valgus varus stressing.  Surgical incisions well-healed.  Calf supple nontender dorsiflexion plantarflexion right ankle intact.  Radiographs: 2 views right knee shows the knee to be well located.  Status post right total knee arthroplasty well-seated components.  No acute fracture or acute findings.  No bony abnormalities.  Impression: Status post right total knee arthroplasty 03/09/2024  Plan: Recommend continuing to work on range of motion and strengthening.  Follow-up with us  at 1 year postop we will obtain an AP and lateral view of the right knee at that time.  Questions were encouraged and answered.

## 2024-09-28 ENCOUNTER — Other Ambulatory Visit: Payer: Self-pay | Admitting: Family Medicine

## 2024-09-28 DIAGNOSIS — I10 Essential (primary) hypertension: Secondary | ICD-10-CM

## 2024-10-02 ENCOUNTER — Encounter: Payer: Self-pay | Admitting: Family Medicine

## 2024-10-04 ENCOUNTER — Other Ambulatory Visit: Payer: Self-pay | Admitting: Adult Health

## 2024-10-04 ENCOUNTER — Encounter

## 2024-10-04 DIAGNOSIS — Z170 Estrogen receptor positive status [ER+]: Secondary | ICD-10-CM

## 2024-10-04 DIAGNOSIS — R928 Other abnormal and inconclusive findings on diagnostic imaging of breast: Secondary | ICD-10-CM

## 2024-10-06 ENCOUNTER — Other Ambulatory Visit: Payer: Self-pay

## 2024-10-06 DIAGNOSIS — Z1379 Encounter for other screening for genetic and chromosomal anomalies: Secondary | ICD-10-CM

## 2024-10-16 ENCOUNTER — Other Ambulatory Visit: Payer: Self-pay | Admitting: Hematology and Oncology

## 2024-10-21 ENCOUNTER — Inpatient Hospital Stay

## 2024-10-21 ENCOUNTER — Inpatient Hospital Stay: Attending: Hematology and Oncology | Admitting: Hematology and Oncology

## 2024-10-21 VITALS — BP 128/70 | HR 64 | Temp 97.4°F | Resp 17 | Wt 197.1 lb

## 2024-10-21 DIAGNOSIS — Z923 Personal history of irradiation: Secondary | ICD-10-CM | POA: Diagnosis not present

## 2024-10-21 DIAGNOSIS — C50412 Malignant neoplasm of upper-outer quadrant of left female breast: Secondary | ICD-10-CM | POA: Insufficient documentation

## 2024-10-21 DIAGNOSIS — Z1732 Human epidermal growth factor receptor 2 negative status: Secondary | ICD-10-CM | POA: Diagnosis not present

## 2024-10-21 DIAGNOSIS — Z170 Estrogen receptor positive status [ER+]: Secondary | ICD-10-CM | POA: Diagnosis not present

## 2024-10-21 DIAGNOSIS — Z1379 Encounter for other screening for genetic and chromosomal anomalies: Secondary | ICD-10-CM

## 2024-10-21 DIAGNOSIS — C50411 Malignant neoplasm of upper-outer quadrant of right female breast: Secondary | ICD-10-CM | POA: Diagnosis not present

## 2024-10-21 DIAGNOSIS — Z79899 Other long term (current) drug therapy: Secondary | ICD-10-CM | POA: Insufficient documentation

## 2024-10-21 DIAGNOSIS — Z79811 Long term (current) use of aromatase inhibitors: Secondary | ICD-10-CM | POA: Diagnosis not present

## 2024-10-21 DIAGNOSIS — Z1721 Progesterone receptor positive status: Secondary | ICD-10-CM | POA: Diagnosis not present

## 2024-10-21 MED ORDER — EXEMESTANE 25 MG PO TABS: 25.0000 mg | ORAL_TABLET | Freq: Every day | ORAL | 3 refills | Status: AC

## 2024-10-21 NOTE — Assessment & Plan Note (Signed)
 09/15/2019:Right lumpectomy ([facility]): IDC with DCIS, 0.9cm, clear margins, 3 right axillary lymph nodes negative.  ER 100%, PR 70%, Ki-67 15%, HER-2 negative, T1BN0 stage Ia pathologic stage  Patient declined adjuvant radiation Prior treatment: Anastrozole  1 mg daily started 10/05/2019 -------------------------------------------------------------------------------------------------------------------------  Left breast 10/02/2023: Left breast 12:00 suspicious mass 0.6 cm biopsy: Grade 2 invasive mammary cancer with mammary carcinoma in situ ER 100%, PR 25%, Ki67 8%, HER2 0 negative   10/20/2023: Left lumpectomy: Grade 2 ILC with extensive LCIS, 1.5 cm, inferior margin positive, ER 100%, PR 25%, HER2 0, Ki-67 8% 11/04/2023: Left inferior margin resection: Benign   Treatment plan: XRT (with Dr. Izell) 12/16/2023-01/05/2024 Adjuvant antiestrogen therapy with letrozole  once daily x 7 years   Anastrozole  toxicities:  Breast cancer surveillance: Breast exam 10/21/2024: Benign Mammogram to be done 04/04/2025  Return to clinic in 1 year for follow-up

## 2024-10-21 NOTE — Progress Notes (Signed)
 "  Patient Care Team: Frann Mabel Mt, DO as PCP - General (Family Medicine) Belinda Cough, MD as Consulting Physician (General Surgery) Odean Potts, MD as Consulting Physician (Hematology and Oncology) Izell Domino, MD as Attending Physician (Radiation Oncology) Tyree Nanetta SAILOR, RN as Oncology Nurse Navigator Gretta Bertrum LELON DEVONNA as Physician Assistant (Orthopedic Surgery)  DIAGNOSIS:  Encounter Diagnosis  Name Primary?   Malignant neoplasm of upper-outer quadrant of right breast in female, estrogen receptor positive (HCC) Yes    SUMMARY OF ONCOLOGIC HISTORY: Oncology History  Malignant neoplasm of upper-outer quadrant of right breast in female, estrogen receptor positive (HCC)  09/08/2019 Initial Diagnosis   Routine screening mammogram detected a 0.8cm mass at the 10 o'clock position in the right breast, no right axillary adenopathy. Biopsy showed IDC with DCIS, grade 2, HER-2 - (1+), ER+ 100%, PR 70%, Ki67 15%.    09/08/2019 Cancer Staging   Staging form: Breast, AJCC 8th Edition - Clinical: Stage IA (cT1b, cN0, cM0, G2, ER+, PR+, HER2-)    09/15/2019 Surgery   Right lumpectomy (Ts[facility]i) ([id number]): IDC with DCIS, 0.9cm, clear margins, 3 right axillary lymph nodes negative.    09/15/2019 Cancer Staging   Staging form: Breast, AJCC 8th Edition - Pathologic stage from 09/15/2019: Stage IA (pT1b, pN0, cM0, G2, ER+, PR+, HER2-)    10/2019 - 09/2024 Anti-estrogen oral therapy   Anastrozole  daily   01/18/2024 Genetic Testing   Negative [facility] CancerNext-Expanded +RNAinsight Panel.  Report date is 01/18/2024.   The CancerNext-Expanded gene panel offered by [facility] and includes sequencing, rearrangement, and RNA analysis for the following 76 genes: AIP, ALK, APC, ATM, AXIN2, BAP1, BARD1, BMPR1A, BRCA1, BRCA2, BRIP1, CDC73, CDH1, CDK4, CDKN1B, CDKN2A, CEBPA, CHEK2, CTNNA1, DDX41, DICER1, ETV6, FH, FLCN, GATA2, LZTR1, MAX, MBD4, MEN1, MET, MLH1, MSH2, MSH3, MSH6,  MUTYH, NF1, NF2, NTHL1, PALB2, PHOX2B, PMS2, POT1, PRKAR1A, PTCH1, PTEN, RAD51C, RAD51D, RB1, RET, RUNX1, SDHA, SDHAF2, SDHB, SDHC, SDHD, SMAD4, SMARCA4, SMARCB1, SMARCE1, STK11, SUFU, TMEM127, TP53, TSC1, TSC2, VHL, and WT1 (sequencing and deletion/duplication); EGFR, HOXB13, KIT, MITF, PDGFRA, POLD1, and POLE (sequencing only); EPCAM and GREM1 (deletion/duplication only).    Malignant neoplasm of upper-outer quadrant of left female breast (HCC)  10/07/2023 Initial Diagnosis   Malignant neoplasm of upper-outer quadrant of left female breast (HCC)   10/13/2023 Cancer Staging   Staging form: Breast, AJCC 8th Edition - Clinical stage from 10/13/2023: Stage IA (cT1b, cN0, cM0, G2, ER+, PR+, HER2-) - Signed by Izell Domino, MD on 10/13/2023 Stage prefix: Initial diagnosis Histologic grading system: 3 grade system   10/20/2023 Surgery   Left lumpectomy: Grade 2 ILC with extensive LCIS, 1.5 cm, inferior margin positive, ER 100%, PR 25%, HER2 0, Ki-67 8%    11/04/2023 Surgery   Left inferior margin resection: Benign   12/15/2023 - 01/05/2024 Radiation Therapy   Plan Name: Breast_L_BH Site: Breast, Left Technique: 3D Mode: Photon Dose Per Fraction: 2.67 Gy Prescribed Dose (Delivered / Prescribed): 24.03 Gy / 40.05 Gy Prescribed Fxs (Delivered / Prescribed): 9 / 15   Plan Name: Breast_L_BHL2 Site: Breast, Left Technique: 3D Mode: Photon Dose Per Fraction: 2.67 Gy Prescribed Dose (Delivered / Prescribed): 16.02 Gy / 16.02 Gy Prescribed Fxs (Delivered / Prescribed): 6 / 6   01/18/2024 Genetic Testing   Negative [facility] CancerNext-Expanded +RNAinsight Panel.  Report date is 01/18/2024.   The CancerNext-Expanded gene panel offered by [facility] and includes sequencing, rearrangement, and RNA analysis for the following 76 genes: AIP, ALK, APC, ATM, AXIN2, BAP1, BARD1, BMPR1A,  BRCA1, BRCA2, BRIP1, CDC73, CDH1, CDK4, CDKN1B, CDKN2A, CEBPA, CHEK2, CTNNA1, DDX41, DICER1, ETV6, FH, FLCN, GATA2, LZTR1, MAX,  MBD4, MEN1, MET, MLH1, MSH2, MSH3, MSH6, MUTYH, NF1, NF2, NTHL1, PALB2, PHOX2B, PMS2, POT1, PRKAR1A, PTCH1, PTEN, RAD51C, RAD51D, RB1, RET, RUNX1, SDHA, SDHAF2, SDHB, SDHC, SDHD, SMAD4, SMARCA4, SMARCB1, SMARCE1, STK11, SUFU, TMEM127, TP53, TSC1, TSC2, VHL, and WT1 (sequencing and deletion/duplication); EGFR, HOXB13, KIT, MITF, PDGFRA, POLD1, and POLE (sequencing only); EPCAM and GREM1 (deletion/duplication only).    12/2023 -  Anti-estrogen oral therapy   Anastrozole  x 7 years     CHIEF COMPLIANT: Follow-up to discuss antiestrogen therapy  HISTORY OF PRESENT ILLNESS:  History of Present Illness Kelly Dixon is a 79 year old female with bilateral early-stage, hormone receptor-positive breast carcinomas status post lumpectomy and adjuvant radiation, presenting for follow-up due to intolerance of aromatase inhibitors.  She is on adjuvant aromatase inhibitor therapy for right breast invasive ductal carcinoma (pT1bN0M0) and left breast invasive lobular carcinoma (cT1bN0M0). She has significant wrist and knee arthralgias on anastrozole , which she links to the medication. She previously had adverse effects on letrozole  and is worried about switching therapies.  She has mild, bilateral breast soreness that started after resuming swimming and feels muscular. It is symmetric and she does not associate it with her medications or surgeries. She swims regularly, works as a lawyer, and has no other new or concerning symptoms.      ALLERGIES:  has no known allergies.  MEDICATIONS:  Current Outpatient Medications  Medication Sig Dispense Refill   acetaminophen  (TYLENOL ) 500 MG tablet Take 1,000 mg by mouth every 6 (six) hours as needed for moderate pain (pain score 4-6).     amLODipine  (NORVASC ) 5 MG tablet TAKE 1 TABLET(5 MG) BY MOUTH DAILY 90 tablet 1   Doxylamine Succinate, Sleep, (SLEEP AID PO) Take 1 tablet by mouth at bedtime.     ferrous sulfate  325 (65 FE) MG EC tablet Take 1 tablet  (325 mg total) by mouth daily with breakfast. 30 tablet 3   fexofenadine (ALLEGRA) 180 MG tablet Take 180 mg by mouth in the morning.     letrozole  (FEMARA ) 2.5 MG tablet TAKE 1 TABLET EVERY DAY 90 tablet 3   Psyllium-Calcium  (FIBER PLUS CALCIUM  PO) Take 2 tablets by mouth in the morning. Chewables     No current facility-administered medications for this visit.    PHYSICAL EXAMINATION: ECOG PERFORMANCE STATUS: 1 - Symptomatic but completely ambulatory  There were no vitals filed for this visit. There were no vitals filed for this visit.  Physical Exam   (exam performed in the presence of a chaperone)  LABORATORY DATA:  I have reviewed the data as listed    Latest Ref Rng & Units 05/05/2024   11:07 AM 03/10/2024    5:54 AM 03/01/2024    8:30 AM  CMP  Glucose 70 - 99 mg/dL 899  863  891   BUN 6 - 23 mg/dL 17  10  11    Creatinine 0.40 - 1.20 mg/dL 9.41  9.28  9.27   Sodium 135 - 145 mEq/L 141  140  140   Potassium 3.5 - 5.1 mEq/L 4.0  4.3  4.0   Chloride 96 - 112 mEq/L 104  107  106   CO2 19 - 32 mEq/L 29  22  27    Calcium  8.4 - 10.5 mg/dL 9.4  8.7  8.9   Total Protein 6.0 - 8.3 g/dL 6.9     Total Bilirubin 0.2 - 1.2  mg/dL 0.4     Alkaline Phos 39 - 117 U/L 56     AST 0 - 37 U/L 13     ALT 0 - 35 U/L 10       Lab Results  Component Value Date   WBC 3.6 (L) 05/05/2024   HGB 13.0 05/05/2024   HCT 39.6 05/05/2024   MCV 95.4 05/05/2024   PLT 234.0 05/05/2024   NEUTROABS 3.6 09/30/2022    ASSESSMENT & PLAN:  Malignant neoplasm of upper-outer quadrant of right breast in female, estrogen receptor positive (HCC) 09/15/2019:Right lumpectomy (Ts[facility]i): IDC with DCIS, 0.9cm, clear margins, 3 right axillary lymph nodes negative.  ER 100%, PR 70%, Ki-67 15%, HER-2 negative, T1BN0 stage Ia pathologic stage  Patient declined adjuvant radiation Prior treatment: Anastrozole  1 mg daily started  10/05/2019 -------------------------------------------------------------------------------------------------------------------------  Left breast 10/02/2023: Left breast 12:00 suspicious mass 0.6 cm biopsy: Grade 2 invasive mammary cancer with mammary carcinoma in situ ER 100%, PR 25%, Ki67 8%, HER2 0 negative   10/20/2023: Left lumpectomy: Grade 2 ILC with extensive LCIS, 1.5 cm, inferior margin positive, ER 100%, PR 25%, HER2 0, Ki-67 8% 11/04/2023: Left inferior margin resection: Benign   Treatment plan: XRT (with Dr. Izell) 12/16/2023-01/05/2024 Adjuvant antiestrogen therapy with letrozole  once daily x 7 years, switched to exemestane  10/21/2024 (muscle aches and pains)   Breast cancer surveillance: Breast exam 10/21/2024: Benign Mammogram to be done 04/04/2025  Return to clinic in 1 year for follow-up    No orders of the defined types were placed in this encounter.  The patient has a good understanding of the overall plan. she agrees with it. she will call with any problems that may develop before the next visit here.  I personally spent a total of 30 minutes in the care of the patient today including preparing to see the patient, getting/reviewing separately obtained history, performing a medically appropriate exam/evaluation, counseling and educating, placing orders, referring and communicating with other health care professionals, documenting clinical information in the EHR, independently interpreting results, communicating results, and coordinating care.   Viinay K Jsaon Yoo, MD 10/21/24    "

## 2024-10-22 ENCOUNTER — Ambulatory Visit

## 2024-10-28 ENCOUNTER — Telehealth: Admitting: Physician Assistant

## 2024-10-28 DIAGNOSIS — M25561 Pain in right knee: Secondary | ICD-10-CM

## 2024-10-28 NOTE — Patient Instructions (Signed)
" °  Kelly Dixon, thank you for joining [name], PA-C for today's virtual visit.  While this provider is not your primary care provider (PCP), if your PCP is located in our provider database this encounter information will be shared with them immediately following your visit.   A [place] MyChart account gives you access to today's visit and all your visits, tests, and labs performed at [facility]  click here if you don't have a [place] MyChart account or go to [email or web address]  Consent: (Patient) Kelly Dixon provided verbal consent for this virtual visit at the beginning of the encounter.  Current Medications:  Current Outpatient Medications:    acetaminophen  (TYLENOL ) 500 MG tablet, Take 1,000 mg by mouth every 6 (six) hours as needed for moderate pain (pain score 4-6)., Disp: , Rfl:    amLODipine  (NORVASC ) 5 MG tablet, TAKE 1 TABLET(5 MG) BY MOUTH DAILY, Disp: 90 tablet, Rfl: 1   exemestane  (AROMASIN ) 25 MG tablet, Take 1 tablet (25 mg total) by mouth daily after breakfast., Disp: 90 tablet, Rfl: 3   ferrous sulfate  325 (65 FE) MG EC tablet, Take 1 tablet (325 mg total) by mouth daily with breakfast., Disp: 30 tablet, Rfl: 3   fexofenadine (ALLEGRA) 180 MG tablet, Take 180 mg by mouth in the morning., Disp: , Rfl:    Psyllium-Calcium  (FIBER PLUS CALCIUM  PO), Take 2 tablets by mouth in the morning. Chewables, Disp: , Rfl:    Medications ordered in this encounter:  No orders of the defined types were placed in this encounter.    *If you need refills on other medications prior to your next appointment, please contact your pharmacy*  Follow-Up: Call back or seek an in-person evaluation if the symptoms worsen or if the condition fails to improve as anticipated.     Other Instructions Continue to ice every 2-3 hours for 15 mins each time. Take Tylenol  as directed on the box. Keep right lower extremity elevated for swelling. Reassess knee tomorrow for  worsening pain, swelling or range of motion. If symptoms worsen then consider going to UC for further evaluation for x-rays and contact ortho for further evaluation.   If you have been instructed to have an in-person evaluation today at a local Urgent Care facility, please use the link below. It will take you to a list of all of our available [facility], including address, phone number and hours of operation. Please do not delay care.  [facility]  If you or a family member do not have a primary care provider, use the link below to schedule a visit and establish care. When you choose a [facility] primary care physician or advanced practice provider, you gain a long-term partner in health. Find a Primary Care Provider  Learn more about [facility]'s in-office and virtual care options:  - Get Care Now  "

## 2024-10-28 NOTE — Progress Notes (Signed)
 " Virtual Visit Consent   Kelly Dixon, you are scheduled for a virtual visit with a [facility] provider today. Just as with appointments in the office, your consent must be obtained to participate. Your consent will be active for this visit and any virtual visit you may have with one of our providers in the next 365 days. If you have a MyChart account, a copy of this consent can be sent to you electronically.  As this is a virtual visit, video technology does not allow for your provider to perform a traditional examination. This may limit your provider's ability to fully assess your condition. If your provider identifies any concerns that need to be evaluated in person or the need to arrange testing (such as labs, EKG, etc.), we will make arrangements to do so. Although advances in technology are sophisticated, we cannot ensure that it will always work on either your end or our end. If the connection with a video visit is poor, the visit may have to be switched to a telephone visit. With either a video or telephone visit, we are not always able to ensure that we have a secure connection.  By engaging in this virtual visit, you consent to the provision of healthcare and authorize for your insurance to be billed (if applicable) for the services provided during this visit. Depending on your insurance coverage, you may receive a charge related to this service.  I need to obtain your verbal consent now. Are you willing to proceed with your visit today? Kelly Dixon has provided verbal consent on 10/28/2024 for a virtual visit (video or telephone). Kelly Borg, PA-C  Date: 10/28/2024 6:54 PM   Virtual Visit via Video Note   I, Kelly Dixon, connected with  Kelly Dixon  ([id number], 1946/04/15) on 10/28/24 at  7:30 PM EST by a video-enabled telemedicine application and verified that I am speaking with the correct person using two identifiers.  Location: Patient: Virtual Visit Location Patient:  Home Provider: Virtual Visit Location Provider: Home Office   I discussed the limitations of evaluation and management by telemedicine and the availability of in person appointments. The patient expressed understanding and agreed to proceed.    History of Present Illness: Kelly Dixon is a 79 y.o. who identifies as a female who was assigned female at birth, and is being seen today for knee pain.  HPI: 78y/o F presents for a telehealth video visit for c/o  knee pain. She fell earlier today backwards, but did not fall directly on the knee. Her concern is that she had TKR in 02/2024 and wants to make sure. She took 2 Tylenol  for pain. Slightly swollen. Slight pain with walking.     Problems:  Patient Active Problem List   Diagnosis Date Noted   Status post total right knee replacement 03/09/2024   Genetic testing 02/05/2024   Invasive lobular carcinoma of breast, stage 1, right (HCC) 10/13/2023   Malignant neoplasm of upper-outer quadrant of left female breast (HCC) 10/07/2023   SBO (small bowel obstruction) (HCC) 09/28/2022   Status post total left knee replacement 03/26/2022   Unilateral primary osteoarthritis, right knee 01/22/2022   Bilateral primary osteoarthritis of knee 01/09/2022   Essential hypertension 09/04/2020   Malignant neoplasm of upper-outer quadrant of right breast in female, estrogen receptor positive (HCC) 09/08/2019    Allergies: Allergies[1] Medications: Current Medications[2]  Observations/Objective: Patient is well-developed, well-nourished in no acute distress.  Resting comfortably  at home.  Head is  normocephalic, atraumatic.  No labored breathing.  Speech is clear and coherent with logical content.  Patient is alert and oriented at baseline.    Assessment and Plan: 1. Acute pain of right knee (Primary)  Continue to ice every 2-3 hours for 15 mins each time. Take Tylenol  as directed on the box. Keep right lower extremity elevated for  swelling. Reassess knee tomorrow for worsening pain, swelling or range of motion. If symptoms worsen then consider going to UC for further evaluation for x-rays and contact ortho for further evaluation. Pt verbalized understanding and in agreement.     Follow Up Instructions: I discussed the assessment and treatment plan with the patient. The patient was provided an opportunity to ask questions and all were answered. The patient agreed with the plan and demonstrated an understanding of the instructions.  A copy of instructions were sent to the patient via MyChart unless otherwise noted below.   Patient has requested to receive PHI (AVS, Work Notes, etc) pertaining to this video visit through e-mail as they are currently without active MyChart. They have voiced understand that email is not considered secure and their health information could be viewed by someone other than the patient.   The patient was advised to call back or seek an in-person evaluation if the symptoms worsen or if the condition fails to improve as anticipated.    Kelly Mccoll, PA-C    [1] No Known Allergies [2]  Current Outpatient Medications:    acetaminophen  (TYLENOL ) 500 MG tablet, Take 1,000 mg by mouth every 6 (six) hours as needed for moderate pain (pain score 4-6)., Disp: , Rfl:    amLODipine  (NORVASC ) 5 MG tablet, TAKE 1 TABLET(5 MG) BY MOUTH DAILY, Disp: 90 tablet, Rfl: 1   exemestane  (AROMASIN ) 25 MG tablet, Take 1 tablet (25 mg total) by mouth daily after breakfast., Disp: 90 tablet, Rfl: 3   ferrous sulfate  325 (65 FE) MG EC tablet, Take 1 tablet (325 mg total) by mouth daily with breakfast., Disp: 30 tablet, Rfl: 3   fexofenadine (ALLEGRA) 180 MG tablet, Take 180 mg by mouth in the morning., Disp: , Rfl:    Psyllium-Calcium  (FIBER PLUS CALCIUM  PO), Take 2 tablets by mouth in the morning. Chewables, Disp: , Rfl:   "

## 2024-10-29 ENCOUNTER — Encounter: Payer: Self-pay | Admitting: Physician Assistant

## 2024-10-29 ENCOUNTER — Telehealth: Payer: Self-pay | Admitting: *Deleted

## 2024-10-29 ENCOUNTER — Other Ambulatory Visit: Payer: Self-pay

## 2024-10-29 ENCOUNTER — Ambulatory Visit: Admitting: Physician Assistant

## 2024-10-29 DIAGNOSIS — Z96651 Presence of right artificial knee joint: Secondary | ICD-10-CM | POA: Diagnosis not present

## 2024-10-29 NOTE — Progress Notes (Signed)
 "  Office Visit Note   Patient: Kelly Dixon           Date of Birth: August 01, 1946           MRN: [id number] Visit Date: 10/29/2024              Requested by: Frann Mabel Mt, DO [address] [place],  [place] [address] PCP: Frann Mabel Mt, DO  Chief Complaint  Patient presents with   Right Knee - Follow-up, Injury      HPI: Patient is a pleasant 79 year old woman who is status post right total knee arthroplasty with Dr. Vernetta in June 2025.  Unfortunately last night she fell.  She did not think she hit her knee but does have bruising on the knee.  She has had progressive pain and difficulty bearing weight.  She does say it feels better this afternoon.  Assessment & Plan: Visit Diagnoses:  1. Status post total right knee replacement     Plan: Status post fall yesterday after right total knee replacement.  I do not see any red flags she has no effusion she has no redness she has some bruising over the anterior proximal tibia that is somewhat tender she has good quadriceps and patella tendon function.  She admits it is better than it was earlier.  X-rays compared to previous x-rays do not show any interval changes.  She will continue to treat this symptomatically.  Will keep in touch with Joen if has any changes.  May follow-up with Dr. Vernetta if needed.  I also encouraged her to keep motion in the knee so is not to get stiff  Follow-Up Instructions: No follow-ups on file.   Ortho Exam  Patient is alert, oriented, no adenopathy, well-dressed, normal affect, normal respiratory effort. Examination of her knee she has a well-healed surgical incision.  No effusion no erythema her compartments are soft and nontender she has good active flexion and extension of her ankles she has good active extension of her knee though at the very end of full extension does recreate a little bit of pain.  She has mild bruising over the proximal tibia anteriorly.  Little  tender to palpation there.  She has good varus valgus stability quadriceps tendon intact patellar tendons intact negative Homans' sign    Imaging: No results found. No images are attached to the encounter.  Labs: No results found for: HGBA1C, ESRSEDRATE, CRP, LABURIC, REPTSTATUS, GRAMSTAIN, CULT, LABORGA   Lab Results  Component Value Date   ALBUMIN 4.5 05/05/2024   ALBUMIN 3.9 03/05/2023   ALBUMIN 4.3 10/21/2022    Lab Results  Component Value Date   MG 2.0 09/30/2022   MG 2.2 09/29/2022   No results found for: VD25OH  No results found for: PREALBUMIN    Latest Ref Rng & Units 05/05/2024   11:07 AM 03/10/2024    5:54 AM 03/01/2024    8:30 AM  CBC EXTENDED  WBC 4.0 - 10.5 K/uL 3.6  9.8  4.1   RBC 3.87 - 5.11 Mil/uL 4.15  3.60  4.15   Hemoglobin 12.0 - 15.0 g/dL 86.9  88.6  86.7   HCT 36.0 - 46.0 % 39.6  33.9  39.5   Platelets 150.0 - 400.0 K/uL 234.0  187  202      There is no height or weight on file to calculate BMI.  Orders:  Orders Placed This Encounter  Procedures   XR KNEE 3 VIEW RIGHT  No orders of the defined types were placed in this encounter.    Procedures: No procedures performed  Clinical Data: No additional findings.  ROS:  All other systems negative, except as noted in the HPI. Review of Systems  Objective: Vital Signs: There were no vitals taken for this visit.  Specialty Comments:  No specialty comments available.  PMFS History: Patient Active Problem List   Diagnosis Date Noted   Status post total right knee replacement 03/09/2024   Genetic testing 02/05/2024   Invasive lobular carcinoma of breast, stage 1, right (HCC) 10/13/2023   Malignant neoplasm of upper-outer quadrant of left female breast (HCC) 10/07/2023   SBO (small bowel obstruction) (HCC) 09/28/2022   Status post total left knee replacement 03/26/2022   Unilateral primary osteoarthritis, right knee 01/22/2022   Bilateral primary osteoarthritis  of knee 01/09/2022   Essential hypertension 09/04/2020   Malignant neoplasm of upper-outer quadrant of right breast in female, estrogen receptor positive (HCC) 09/08/2019   Past Medical History:  Diagnosis Date   Allergy 1996   Anemia    Arthritis    Breast cancer (HCC) 2020   right breast IDC   Breast cancer (HCC) 10/2023   left breast ILC   Cataract May 2018   Gallstones 05/31/2018   Hypertension    Personal history of radiation therapy    PONV (postoperative nausea and vomiting)    Syphilis     Family History  Problem Relation Age of Onset   Arthritis Mother    Hearing loss Mother    Heart disease Father    Arthritis Sister    Breast cancer Sister        dx 50s; paternal half sister   Colon cancer Neg Hx    Colon polyps Neg Hx    Esophageal cancer Neg Hx    Rectal cancer Neg Hx    Stomach cancer Neg Hx     Past Surgical History:  Procedure Laterality Date   BREAST BIOPSY Right 08/2019   BREAST BIOPSY Left 10/02/2023   US  LT BREAST BX W LOC DEV 1ST LESION IMG BX SPEC US  GUIDE 10/02/2023 [facility]   BREAST BIOPSY  10/17/2023   MM LT RADIOACTIVE SEED LOC MAMMO GUIDE 10/17/2023 [facility]   BREAST LUMPECTOMY Right 08/2019   BREAST LUMPECTOMY WITH RADIOACTIVE SEED AND SENTINEL LYMPH NODE BIOPSY Right 09/15/2019   Procedure: RIGHT BREAST LUMPECTOMY WITH RADIOACTIVE SEED AND SENTINEL LYMPH NODE BIOPSY;  Surgeon: Belinda Cough, MD;  Location: [facility];  Service: General;  Laterality: Right;   BREAST LUMPECTOMY WITH RADIOACTIVE SEED LOCALIZATION Left 10/20/2023   Procedure: LEFT BREAST LUMPECTOMY WITH RADIOACTIVE SEED LOCALIZATION;  Surgeon: Belinda Cough, MD;  Location: [facility];  Service: General;  Laterality: Left;   CHOLECYSTECTOMY N/A 04/13/2019   Procedure: LAPAROSCOPIC CHOLECYSTECTOMY WITH INTRAOPERATIVE CHOLANGIOGRAM;  Surgeon: Belinda Cough, MD;  Location: [facility];  Service: General;  Laterality: N/A;   DILATION AND CURETTAGE OF UTERUS      JOINT REPLACEMENT  June 2023   RE-EXCISION OF BREAST LUMPECTOMY Left 11/04/2023   Procedure: RE-EXCISION OF INFERIOR MARGIN-LEFT BREAST LUMPECTOMY;  Surgeon: Belinda Cough, MD;  Location: [facility];  Service: General;  Laterality: Left;  LMA   TOTAL KNEE ARTHROPLASTY Left 03/26/2022   Procedure: LEFT TOTAL KNEE ARTHROPLASTY;  Surgeon: Vernetta Lonni GRADE, MD;  Location: [facility] OR;  Service: Orthopedics;  Laterality: Left;   TOTAL KNEE ARTHROPLASTY Right 03/09/2024   Procedure: ARTHROPLASTY, KNEE, TOTAL;  Surgeon: Vernetta Lonni  Y, MD;  Location: [facility] OR;  Service: Orthopedics;  Laterality: Right;   TUBAL LIGATION  1974   Social History   Occupational History   Not on file  Tobacco Use   Smoking status: Former    Current packs/day: 0.00    Average packs/day: 0.5 packs/day for 54.2 years (27.1 ttl pk-yrs)    Types: Cigarettes    Start date: 10/04/1965    Quit date: 11/29/2019    Years since quitting: 4.9   Smokeless tobacco: Never  Vaping Use   Vaping status: Never Used  Substance and Sexual Activity   Alcohol use: Not Currently   Drug use: Never   Sexual activity: Not Currently       "

## 2024-10-29 NOTE — Telephone Encounter (Signed)
 Patient called and said she fell on ice yesterday. She did not think she hit her knee, but is bruised and can't walk on it today. Right TKA on 03/09/24. Icing and Tylenol  and did virtual visit yesterday with PCP and told her to contact us  if not improved some. Seeing Ronal Dragon Today at 1:15 pm. Thanks.

## 2024-11-05 ENCOUNTER — Encounter: Payer: Self-pay | Admitting: Adult Health

## 2024-11-05 LAB — GUARDANT REVEAL

## 2025-04-04 ENCOUNTER — Encounter

## 2025-10-20 ENCOUNTER — Inpatient Hospital Stay: Admitting: Hematology and Oncology
